# Patient Record
Sex: Female | Born: 1935 | Race: White | Hispanic: No | Smoking: Never smoker
Health system: Southern US, Community
[De-identification: ages and names within clinical notes are randomized; demographics above are authoritative.]

## PROBLEM LIST (undated history)

## (undated) DIAGNOSIS — I499 Cardiac arrhythmia, unspecified: Secondary | ICD-10-CM

## (undated) DIAGNOSIS — I4891 Unspecified atrial fibrillation: Secondary | ICD-10-CM

## (undated) DIAGNOSIS — R351 Nocturia: Secondary | ICD-10-CM

## (undated) DIAGNOSIS — D649 Anemia, unspecified: Secondary | ICD-10-CM

## (undated) DIAGNOSIS — K219 Gastro-esophageal reflux disease without esophagitis: Secondary | ICD-10-CM

## (undated) DIAGNOSIS — K649 Unspecified hemorrhoids: Secondary | ICD-10-CM

## (undated) DIAGNOSIS — E039 Hypothyroidism, unspecified: Secondary | ICD-10-CM

## (undated) DIAGNOSIS — Q6211 Congenital occlusion of ureteropelvic junction: Secondary | ICD-10-CM

## (undated) DIAGNOSIS — I4892 Unspecified atrial flutter: Secondary | ICD-10-CM

## (undated) DIAGNOSIS — R011 Cardiac murmur, unspecified: Secondary | ICD-10-CM

## (undated) DIAGNOSIS — Z8719 Personal history of other diseases of the digestive system: Secondary | ICD-10-CM

## (undated) DIAGNOSIS — R358 Other polyuria: Secondary | ICD-10-CM

## (undated) DIAGNOSIS — Z86018 Personal history of other benign neoplasm: Secondary | ICD-10-CM

## (undated) DIAGNOSIS — N390 Urinary tract infection, site not specified: Secondary | ICD-10-CM

## (undated) DIAGNOSIS — R3589 Other polyuria: Secondary | ICD-10-CM

## (undated) DIAGNOSIS — Z87440 Personal history of urinary (tract) infections: Secondary | ICD-10-CM

## (undated) DIAGNOSIS — N1330 Unspecified hydronephrosis: Secondary | ICD-10-CM

## (undated) DIAGNOSIS — E7800 Pure hypercholesterolemia, unspecified: Secondary | ICD-10-CM

## (undated) DIAGNOSIS — K449 Diaphragmatic hernia without obstruction or gangrene: Secondary | ICD-10-CM

## (undated) DIAGNOSIS — I10 Essential (primary) hypertension: Secondary | ICD-10-CM

## (undated) DIAGNOSIS — IMO0002 Reserved for concepts with insufficient information to code with codable children: Secondary | ICD-10-CM

## (undated) DIAGNOSIS — Z8639 Personal history of other endocrine, nutritional and metabolic disease: Secondary | ICD-10-CM

## (undated) HISTORY — DX: Reserved for concepts with insufficient information to code with codable children: IMO0002

## (undated) HISTORY — PX: THYROID LOBECTOMY: SHX420

## (undated) HISTORY — DX: Hypothyroidism, unspecified: E03.9

## (undated) HISTORY — DX: Gastro-esophageal reflux disease without esophagitis: K21.9

## (undated) HISTORY — PX: BACK SURGERY: SHX140

## (undated) HISTORY — DX: Diaphragmatic hernia without obstruction or gangrene: K44.9

## (undated) HISTORY — DX: Unspecified atrial fibrillation: I48.91

## (undated) HISTORY — DX: Urinary tract infection, site not specified: N39.0

## (undated) HISTORY — PX: CATARACT EXTRACTION: SUR2

## (undated) HISTORY — PX: CHOLECYSTECTOMY: SHX55

## (undated) HISTORY — PX: CERVICAL DISC SURGERY: SHX588

## (undated) HISTORY — DX: Unspecified atrial flutter: I48.92

## (undated) HISTORY — PX: TOTAL ABDOMINAL HYSTERECTOMY W/ BILATERAL SALPINGOOPHORECTOMY: SHX83

## (undated) HISTORY — DX: Unspecified hydronephrosis: N13.30

## (undated) HISTORY — DX: Congenital occlusion of ureteropelvic junction: Q62.11

---

## 2002-11-25 LAB — HM PAP SMEAR

## 2004-07-24 ENCOUNTER — Ambulatory Visit: Payer: Self-pay

## 2004-08-13 ENCOUNTER — Ambulatory Visit: Payer: Self-pay

## 2005-02-17 ENCOUNTER — Ambulatory Visit: Payer: Self-pay | Admitting: Internal Medicine

## 2005-03-04 ENCOUNTER — Ambulatory Visit: Payer: Self-pay | Admitting: Urology

## 2005-07-05 ENCOUNTER — Ambulatory Visit: Payer: Self-pay | Admitting: Urology

## 2005-09-03 ENCOUNTER — Ambulatory Visit (HOSPITAL_COMMUNITY): Admission: RE | Admit: 2005-09-03 | Discharge: 2005-09-03 | Payer: Self-pay | Admitting: Neurosurgery

## 2005-09-14 ENCOUNTER — Encounter: Admission: RE | Admit: 2005-09-14 | Discharge: 2005-09-14 | Payer: Self-pay | Admitting: Neurosurgery

## 2005-10-04 ENCOUNTER — Encounter: Admission: RE | Admit: 2005-10-04 | Discharge: 2005-10-04 | Payer: Self-pay | Admitting: Neurosurgery

## 2006-01-05 ENCOUNTER — Ambulatory Visit: Payer: Self-pay | Admitting: Urology

## 2006-01-07 ENCOUNTER — Encounter: Admission: RE | Admit: 2006-01-07 | Discharge: 2006-01-07 | Payer: Self-pay | Admitting: Neurosurgery

## 2006-04-19 DIAGNOSIS — K219 Gastro-esophageal reflux disease without esophagitis: Secondary | ICD-10-CM

## 2006-04-19 HISTORY — DX: Gastro-esophageal reflux disease without esophagitis: K21.9

## 2006-08-02 ENCOUNTER — Ambulatory Visit: Payer: Self-pay | Admitting: Infectious Diseases

## 2006-08-18 LAB — HM COLONOSCOPY

## 2006-09-06 ENCOUNTER — Ambulatory Visit: Payer: Self-pay | Admitting: Unknown Physician Specialty

## 2006-09-26 ENCOUNTER — Ambulatory Visit: Payer: Self-pay | Admitting: Specialist

## 2006-11-02 ENCOUNTER — Ambulatory Visit: Payer: Self-pay | Admitting: Internal Medicine

## 2006-11-02 LAB — HM DEXA SCAN

## 2006-11-21 ENCOUNTER — Encounter: Payer: Self-pay | Admitting: Internal Medicine

## 2006-12-19 ENCOUNTER — Encounter: Payer: Self-pay | Admitting: Internal Medicine

## 2006-12-23 ENCOUNTER — Ambulatory Visit: Payer: Self-pay | Admitting: Internal Medicine

## 2007-01-18 ENCOUNTER — Encounter: Payer: Self-pay | Admitting: Internal Medicine

## 2007-02-18 ENCOUNTER — Encounter: Payer: Self-pay | Admitting: Internal Medicine

## 2007-04-17 ENCOUNTER — Ambulatory Visit: Payer: Self-pay | Admitting: Internal Medicine

## 2007-05-18 ENCOUNTER — Ambulatory Visit: Payer: Self-pay | Admitting: Internal Medicine

## 2007-09-25 ENCOUNTER — Ambulatory Visit: Payer: Self-pay

## 2007-10-04 ENCOUNTER — Ambulatory Visit: Payer: Self-pay | Admitting: Urology

## 2008-02-15 ENCOUNTER — Ambulatory Visit: Payer: Self-pay | Admitting: Internal Medicine

## 2008-07-11 ENCOUNTER — Ambulatory Visit: Payer: Self-pay | Admitting: Internal Medicine

## 2008-07-22 ENCOUNTER — Encounter: Payer: Self-pay | Admitting: Internal Medicine

## 2008-08-17 ENCOUNTER — Encounter: Payer: Self-pay | Admitting: Internal Medicine

## 2008-09-17 ENCOUNTER — Encounter: Payer: Self-pay | Admitting: Internal Medicine

## 2008-10-09 ENCOUNTER — Ambulatory Visit: Payer: Self-pay | Admitting: Urology

## 2009-04-08 ENCOUNTER — Ambulatory Visit: Payer: Self-pay | Admitting: Internal Medicine

## 2009-04-13 ENCOUNTER — Inpatient Hospital Stay: Payer: Self-pay | Admitting: Internal Medicine

## 2009-06-20 ENCOUNTER — Ambulatory Visit: Payer: Self-pay | Admitting: Internal Medicine

## 2009-07-29 ENCOUNTER — Ambulatory Visit: Payer: Self-pay | Admitting: Internal Medicine

## 2009-12-09 ENCOUNTER — Emergency Department: Payer: Self-pay | Admitting: Internal Medicine

## 2009-12-10 ENCOUNTER — Emergency Department: Payer: Self-pay | Admitting: Emergency Medicine

## 2009-12-16 ENCOUNTER — Encounter: Payer: Self-pay | Admitting: Internal Medicine

## 2009-12-18 ENCOUNTER — Encounter: Payer: Self-pay | Admitting: Internal Medicine

## 2010-01-17 ENCOUNTER — Encounter: Payer: Self-pay | Admitting: Internal Medicine

## 2010-04-19 LAB — HM MAMMOGRAPHY

## 2010-05-07 ENCOUNTER — Ambulatory Visit: Payer: Self-pay | Admitting: Internal Medicine

## 2010-09-06 ENCOUNTER — Ambulatory Visit: Payer: Self-pay | Admitting: Internal Medicine

## 2010-12-04 ENCOUNTER — Encounter: Payer: Self-pay | Admitting: Internal Medicine

## 2010-12-15 ENCOUNTER — Ambulatory Visit (INDEPENDENT_AMBULATORY_CARE_PROVIDER_SITE_OTHER): Payer: Medicare Other | Admitting: Internal Medicine

## 2010-12-15 ENCOUNTER — Encounter: Payer: Self-pay | Admitting: Internal Medicine

## 2010-12-15 DIAGNOSIS — Z7901 Long term (current) use of anticoagulants: Secondary | ICD-10-CM

## 2010-12-15 DIAGNOSIS — E039 Hypothyroidism, unspecified: Secondary | ICD-10-CM | POA: Insufficient documentation

## 2010-12-15 DIAGNOSIS — IMO0001 Reserved for inherently not codable concepts without codable children: Secondary | ICD-10-CM

## 2010-12-15 DIAGNOSIS — K589 Irritable bowel syndrome without diarrhea: Secondary | ICD-10-CM

## 2010-12-15 DIAGNOSIS — N390 Urinary tract infection, site not specified: Secondary | ICD-10-CM | POA: Insufficient documentation

## 2010-12-15 DIAGNOSIS — I4891 Unspecified atrial fibrillation: Secondary | ICD-10-CM | POA: Insufficient documentation

## 2010-12-15 DIAGNOSIS — K219 Gastro-esophageal reflux disease without esophagitis: Secondary | ICD-10-CM

## 2010-12-15 LAB — POCT URINALYSIS DIPSTICK
Bilirubin, UA: NEGATIVE
Glucose, UA: NEGATIVE
Ketones, UA: NEGATIVE
Leukocytes, UA: NEGATIVE
Nitrite, UA: NEGATIVE
Protein, UA: NEGATIVE
Spec Grav, UA: 1.02
Urobilinogen, UA: 0.2
pH, UA: 5.5

## 2010-12-15 LAB — TSH: TSH: 2.34 u[IU]/mL (ref 0.35–5.50)

## 2010-12-15 LAB — PROTIME-INR
INR: 3.8 ratio — ABNORMAL HIGH (ref 0.8–1.0)
Prothrombin Time: 37.4 s — ABNORMAL HIGH (ref 10.2–12.4)

## 2010-12-15 NOTE — Patient Instructions (Addendum)
Try taking 10 or 20 mg of otc pepcid ( famotidine) at bedtime to reduce your morning reflux symptoms.   If you have episodes of rapid heart rate ,  (rate > 100 beats/minute),  We will prescribe a short acting medicine that will slow it down.) We will repeat your urine today to see if the infection has cleared.  You can stop the nitrofurantoin.

## 2010-12-15 NOTE — Assessment & Plan Note (Signed)
PT/INR is due for monthly check.

## 2010-12-15 NOTE — Assessment & Plan Note (Signed)
symptoms not controlled with once daily PPI.  Adding nighttime blocker.

## 2010-12-15 NOTE — Progress Notes (Signed)
  Subjective:    [name], female    DOB: 12/27/1929, 77 y.o.   MRN: [id number]  HPI  [name] is a 77 yr old white female with a history of chronic abdominal pain secondary to IBS, chronic atrial fibrillation on rate controlling medications and long term anticoagulation who presents with increased abdominal pain following treatment for a UTI by Urgent care on Sunday with nitrofurantoin.  She has taken 4 doses of the medication and has increased nausea and reflux symptoms .  Dysuria has resolved,    Review of Systems  Constitutional: Negative for fever, chills and fatigue.  Gastrointestinal: Positive for nausea. Negative for vomiting, abdominal pain, diarrhea, constipation and rectal pain.  Genitourinary: Negative for dysuria, urgency, frequency, hematuria, flank pain, decreased urine volume, vaginal bleeding, vaginal discharge, difficulty urinating, vaginal pain and pelvic pain.  Musculoskeletal: Positive for back pain.       Objective:   Physical Exam  Constitutional: She is oriented to person, place, and time. She appears well-developed and well-nourished.  HENT:  Mouth/Throat: Oropharynx is clear and moist.  Eyes: EOM are normal. Pupils are equal, round, and reactive to light. No scleral icterus.  Neck: Normal range of motion. Neck supple. No JVD present. No thyromegaly present.  Cardiovascular: Normal rate, normal heart sounds and intact distal pulses.   Pulmonary/Chest: Effort normal and breath sounds normal.  Abdominal: Soft. Bowel sounds are normal. She exhibits no mass. There is no tenderness.  Musculoskeletal: Normal range of motion. She exhibits no edema.  Lymphadenopathy:    She has no cervical adenopathy.  Neurological: She is alert and oriented to person, place, and time.  Skin: Skin is warm and dry.  Psychiatric: She has a normal mood and affect.          Assessment & Plan:

## 2010-12-15 NOTE — Assessment & Plan Note (Addendum)
Repeat UA today was normal.  Since she is not tolerating macrobid,  Will stop abx. Culture data from Urgent Care requested.

## 2010-12-15 NOTE — Assessment & Plan Note (Signed)
Currently rate controlled.  She is instructed to measure her pulse when she has symptoms of palpitations.

## 2010-12-16 ENCOUNTER — Other Ambulatory Visit: Payer: Self-pay | Admitting: Internal Medicine

## 2010-12-16 DIAGNOSIS — Z7901 Long term (current) use of anticoagulants: Secondary | ICD-10-CM

## 2010-12-18 ENCOUNTER — Telehealth: Payer: Self-pay | Admitting: Internal Medicine

## 2010-12-18 ENCOUNTER — Other Ambulatory Visit: Payer: Self-pay | Admitting: Internal Medicine

## 2010-12-18 MED ORDER — WARFARIN SODIUM 5 MG PO TABS: 5.0000 mg | ORAL_TABLET | Freq: Every day | ORAL | Status: DC

## 2010-12-18 NOTE — Telephone Encounter (Signed)
Patient is asking if she should start taking the 6 mg of coumadin tomorrow 12/19/10, she said the doctor recommend she start taking 5mg  when she started taking the coumadin again. If an Rx needs to be called in please send it to [facility] in [place]. Patient said okay to leave detailed mess on her home ans mach

## 2010-12-18 NOTE — Telephone Encounter (Signed)
Patient has been advised on how to take her medication.  5 mg everyday starting today (12/18/10)  Rx has been called in.

## 2010-12-19 ENCOUNTER — Encounter: Payer: Self-pay | Admitting: Internal Medicine

## 2010-12-19 DIAGNOSIS — K589 Irritable bowel syndrome without diarrhea: Secondary | ICD-10-CM

## 2010-12-23 ENCOUNTER — Other Ambulatory Visit (INDEPENDENT_AMBULATORY_CARE_PROVIDER_SITE_OTHER): Payer: Medicare Other | Admitting: *Deleted

## 2010-12-23 DIAGNOSIS — Z7901 Long term (current) use of anticoagulants: Secondary | ICD-10-CM

## 2010-12-23 LAB — PROTIME-INR
INR: 2 ratio — ABNORMAL HIGH (ref 0.8–1.0)
Prothrombin Time: 20.7 s — ABNORMAL HIGH (ref 10.2–12.4)

## 2011-01-20 ENCOUNTER — Encounter: Payer: Self-pay | Admitting: Internal Medicine

## 2011-01-20 ENCOUNTER — Ambulatory Visit (INDEPENDENT_AMBULATORY_CARE_PROVIDER_SITE_OTHER): Payer: Medicare Other | Admitting: Internal Medicine

## 2011-01-20 DIAGNOSIS — K209 Esophagitis, unspecified without bleeding: Secondary | ICD-10-CM

## 2011-01-20 DIAGNOSIS — Z7901 Long term (current) use of anticoagulants: Secondary | ICD-10-CM

## 2011-01-20 DIAGNOSIS — J329 Chronic sinusitis, unspecified: Secondary | ICD-10-CM

## 2011-01-20 MED ORDER — AZITHROMYCIN 500 MG PO TABS: ORAL_TABLET | ORAL | Status: DC

## 2011-01-20 NOTE — Patient Instructions (Addendum)
Take a dose of mylanta or gaviscon liquid,  At lunch and at 7 pm.  To see if it helps the burning.  Continue the generic protonix twice daily   Start the azithromycin tonight with your coumadin.  Skip your coumadin dose on Friday

## 2011-01-21 ENCOUNTER — Encounter: Payer: Self-pay | Admitting: Internal Medicine

## 2011-01-21 LAB — PROTIME-INR
INR: 1.98 — ABNORMAL HIGH (ref <1.50)
Prothrombin Time: 23.2 seconds — ABNORMAL HIGH (ref 11.6–15.2)

## 2011-01-21 NOTE — Progress Notes (Signed)
  Subjective:    [name], female    DOB: February 13, 1930, 77 y.o.   MRN: [id number]  HPI 77 yo white feamle with frequent visits for recurrent abdominal and esophageal discomfort attributed to IBSand GERD  by prior comprehensive GI workup,presents with increased esophageal burning and sore throat which has been worse for the past 4 to 5 days and accompanied by sinus drainage. No fevers but feels generally terrible.  Using protonix generic twice daily but has not tried mylanta or gaviscon for immediate relief of burning due to confusion about timing of administration around her other scheduled medications.   Past Medical History  Diagnosis Date  . Hypothyroidism   . Osteoporosis   . Recurrent UTI   . Hydronephrosis     uretero- with congenital UPJ obstruction- [name]  . GERD (gastroesophageal reflux disease)   . DDD (degenerative disc disease)   . Atrial fib/flutter, transient     [facility] admission for RVR 03/2009  . Hydronephrosis with ureteropelvic junction obstruction    Current Outpatient Prescriptions on File Prior to Visit  Medication Sig Dispense Refill  . diltiazem (DILACOR XR) 180 MG 24 hr capsule Take 180 mg by mouth daily.        Marland Kitchen levothyroxine (SYNTHROID, LEVOTHROID) 50 MCG tablet Take 50 mcg by mouth daily.        . pantoprazole (PROTONIX) 40 MG tablet Take 40 mg by mouth 2 (two) times daily.        . Simethicone (GAS-X PO) Take by mouth as needed       . Tetrahydrozoline HCl (EYE DROPS OP) Always eye drops- one drop two times daily in both eyes      . warfarin (COUMADIN) 5 MG tablet Take 1 tablet (5 mg total) by mouth daily.  30 tablet  3    Review of Systems  Constitutional: Negative for fever, chills, fatigue and unexpected weight change.  HENT: Positive for sore throat, trouble swallowing and sinus pressure. Negative for hearing loss, ear pain, nosebleeds, congestion, facial swelling, rhinorrhea, sneezing, mouth sores, neck pain, neck stiffness, voice  change, postnasal drip, tinnitus and ear discharge.   Eyes: Negative for pain, discharge, redness and visual disturbance.  Respiratory: Positive for cough. Negative for chest tightness, shortness of breath, wheezing and stridor.   Cardiovascular: Negative for chest pain, palpitations and leg swelling.  Gastrointestinal: Positive for nausea and abdominal pain. Negative for vomiting, diarrhea, constipation and rectal pain.  Genitourinary: Negative for dysuria, urgency, frequency, hematuria, flank pain, decreased urine volume, vaginal bleeding, vaginal discharge, difficulty urinating, vaginal pain and pelvic pain.  Musculoskeletal: Positive for back pain. Negative for myalgias and arthralgias.  Skin: Negative for color change and rash.  Neurological: Negative for dizziness, weakness, light-headedness and headaches.  Hematological: Negative for adenopathy.       Objective:   Physical Exam        Assessment & Plan:  Pharyngitis/esophagitis:  symptosm may be current exacerbated by URI with excessive sinus drainage.  Will treat empirically with one week of azithromycin and have her try gaviscon as directed for immediate relief of burning.  i do wonder if her persistent symptoms may be due to uncontrolled esophagitis due to hiatal hernia and have dicussed referral to [facility] for a fresh perspective since I have been unable to get her symptoms under control.

## 2011-01-25 ENCOUNTER — Other Ambulatory Visit: Payer: Medicare Other

## 2011-02-15 ENCOUNTER — Encounter: Payer: Self-pay | Admitting: Internal Medicine

## 2011-02-15 ENCOUNTER — Ambulatory Visit (INDEPENDENT_AMBULATORY_CARE_PROVIDER_SITE_OTHER): Payer: Medicare Other | Admitting: Internal Medicine

## 2011-02-15 DIAGNOSIS — K209 Esophagitis, unspecified without bleeding: Secondary | ICD-10-CM

## 2011-02-15 DIAGNOSIS — M79609 Pain in unspecified limb: Secondary | ICD-10-CM

## 2011-02-15 DIAGNOSIS — Z7901 Long term (current) use of anticoagulants: Secondary | ICD-10-CM

## 2011-02-15 MED ORDER — SUCRALFATE 1 GM/10ML PO SUSP: 1.0000 g | Freq: Every evening | ORAL | Status: DC | PRN

## 2011-02-15 NOTE — Progress Notes (Signed)
Subjective:    [name], female    DOB: 06/01/1929, 77 y.o.   MRN: [id number]  HPI  77 yo white female with history of chronic abdominal complaints secondary to IBS and gastritis, atrial fibrillation on chronic anticoagulation presents with persistent esophageal burning which is waking her up at night. Her new complaint is right upper arm pain in the bicps afrea aggravated by adduction and internal rotation of arm.  Recent activities include use of al eaf blower over the weekend. Past Medical History  Diagnosis Date  . Hypothyroidism   . Osteoporosis   . Recurrent UTI   . Hydronephrosis     uretero- with congenital UPJ obstruction- [name]  . GERD (gastroesophageal reflux disease)   . DDD (degenerative disc disease)   . Atrial fib/flutter, transient     [facility] admission for RVR 03/2009  . Hydronephrosis with ureteropelvic junction obstruction      Current Outpatient Prescriptions on File Prior to Visit  Medication Sig Dispense Refill  . diltiazem (DILACOR XR) 180 MG 24 hr capsule Take 180 mg by mouth daily.        Marland Kitchen levothyroxine (SYNTHROID, LEVOTHROID) 50 MCG tablet Take 50 mcg by mouth daily.        . pantoprazole (PROTONIX) 40 MG tablet Take 40 mg by mouth 2 (two) times daily.        . Simethicone (GAS-X PO) Take by mouth as needed       . Tetrahydrozoline HCl (EYE DROPS OP) Always eye drops- one drop two times daily in both eyes      . warfarin (COUMADIN) 5 MG tablet Take 1 tablet (5 mg total) by mouth daily.  30 tablet  3   Review of Systems  Constitutional: Negative for fever, chills, fatigue and unexpected weight change.  HENT: Positive for sore throat, trouble swallowing and sinus pressure. Negative for hearing loss, ear pain, nosebleeds, congestion, facial swelling, rhinorrhea, sneezing, mouth sores, neck pain, neck stiffness, voice change, postnasal drip, tinnitus and ear discharge.   Eyes: Negative for pain, discharge, redness and visual disturbance.    Respiratory: Positive for cough. Negative for chest tightness, shortness of breath, wheezing and stridor.   Cardiovascular: Negative for chest pain, palpitations and leg swelling.  Gastrointestinal: Positive for nausea and abdominal pain. Negative for vomiting, diarrhea, constipation and rectal pain.  Genitourinary: Negative for dysuria, urgency, frequency, hematuria, flank pain, decreased urine volume, vaginal bleeding, vaginal discharge, difficulty urinating, vaginal pain and pelvic pain.  Musculoskeletal: Positive for back pain. Negative for myalgias and arthralgias.  Skin: Negative for color change and rash.  Neurological: Negative for dizziness, weakness, light-headedness and headaches.  Hematological: Negative for adenopathy.   BP 141/83  Pulse 77  Temp(Src) 97.8 F (36.6 C) (Oral)  Resp 16  Ht 5\' 4"  (1.626 m)  Wt 147 lb 8 oz (66.906 kg)  BMI 25.32 kg/m2  SpO2 97%     Objective:   Physical Exam  Constitutional: She is oriented to person, place, and time. She appears well-developed and well-nourished.  HENT:  Mouth/Throat: Oropharynx is clear and moist.  Eyes: EOM are normal. Pupils are equal, round, and reactive to light. No scleral icterus.  Neck: Normal range of motion. Neck supple. No JVD present. No thyromegaly present.  Cardiovascular: Normal rate, regular rhythm, normal heart sounds and intact distal pulses.   Pulmonary/Chest: Effort normal and breath sounds normal.  Abdominal: Soft. Bowel sounds are normal. She exhibits no mass. There is no tenderness.  Musculoskeletal: Normal range of motion. She exhibits tenderness. She exhibits no edema.  Lymphadenopathy:    She has no cervical adenopathy.  Neurological: She is alert and oriented to person, place, and time.  Skin: Skin is warm and dry.  Psychiatric: She has a normal mood and affect.      Assessment & Plan:  Arm pain:  She appears to have strained her biceps from using the leaf blower.  Recommended use od NSAID  judiciously, and ice/heat.   Esophagitis:  She has persistent symptoms despite use of protonix bid.  Trial of sucralfate.

## 2011-02-15 NOTE — Patient Instructions (Signed)
We are going to try a dose of sucralfate (2 teaspoons) at bedtime for your esophagus and stomach burning.  You may repeat it at 3 am if you need to.    You strained your bicep muscle from blowing your leaves.  You can try Alleve or Motrin for pain and try icing it for 15 minutes every 5 or 6 hours

## 2011-02-22 ENCOUNTER — Other Ambulatory Visit (INDEPENDENT_AMBULATORY_CARE_PROVIDER_SITE_OTHER): Payer: Medicare Other | Admitting: *Deleted

## 2011-02-22 ENCOUNTER — Telehealth: Payer: Self-pay | Admitting: Internal Medicine

## 2011-02-22 DIAGNOSIS — Z7901 Long term (current) use of anticoagulants: Secondary | ICD-10-CM

## 2011-02-22 DIAGNOSIS — Z23 Encounter for immunization: Secondary | ICD-10-CM

## 2011-02-22 LAB — PROTIME-INR
INR: 1.9 ratio — ABNORMAL HIGH (ref 0.8–1.0)
Prothrombin Time: 21.4 s — ABNORMAL HIGH (ref 10.2–12.4)

## 2011-02-22 NOTE — Telephone Encounter (Signed)
[phone] Pt came in for labs and she said when she was in last Monday Dr Derrel Nip had sent in an rx to [facility] in [place].   [facility] has not filled this yet its a brand name rx

## 2011-02-22 NOTE — Progress Notes (Signed)
Addended by: Lacretia Nicks L on: 02/22/2011 11:12 AM   Modules accepted: Orders

## 2011-02-23 NOTE — Telephone Encounter (Signed)
I called patient and found out what Rx she was talking about and it has been called in.

## 2011-02-24 ENCOUNTER — Other Ambulatory Visit: Payer: Medicare Other

## 2011-03-08 ENCOUNTER — Encounter: Payer: Self-pay | Admitting: Internal Medicine

## 2011-03-08 ENCOUNTER — Ambulatory Visit (INDEPENDENT_AMBULATORY_CARE_PROVIDER_SITE_OTHER): Payer: Medicare Other | Admitting: Internal Medicine

## 2011-03-08 VITALS — BP 122/62 | HR 93 | Temp 98.5°F | Resp 14 | Wt 146.2 lb

## 2011-03-08 DIAGNOSIS — K219 Gastro-esophageal reflux disease without esophagitis: Secondary | ICD-10-CM

## 2011-03-08 DIAGNOSIS — R1013 Epigastric pain: Secondary | ICD-10-CM

## 2011-03-08 LAB — COMPREHENSIVE METABOLIC PANEL
ALT: 10 U/L (ref 0–35)
AST: 23 U/L (ref 0–37)
Albumin: 4 g/dL (ref 3.5–5.2)
Alkaline Phosphatase: 74 U/L (ref 39–117)
BUN: 17 mg/dL (ref 6–23)
CO2: 28 mEq/L (ref 19–32)
Calcium: 9.2 mg/dL (ref 8.4–10.5)
Chloride: 107 mEq/L (ref 96–112)
Creatinine, Ser: 1.1 mg/dL (ref 0.4–1.2)
GFR: 52.59 mL/min — ABNORMAL LOW (ref >60.00)
Glucose, Bld: 96 mg/dL (ref 70–99)
Potassium: 5.4 mEq/L — ABNORMAL HIGH (ref 3.5–5.1)
Sodium: 141 mEq/L (ref 135–145)
Total Bilirubin: 0.5 mg/dL (ref 0.3–1.2)
Total Protein: 7.1 g/dL (ref 6.0–8.3)

## 2011-03-08 LAB — CBC WITH DIFFERENTIAL/PLATELET
Basophils Absolute: 0 10*3/uL (ref 0.0–0.1)
Basophils Relative: 0.3 % (ref 0.0–3.0)
Eosinophils Absolute: 0.1 10*3/uL (ref 0.0–0.7)
Eosinophils Relative: 1.1 % (ref 0.0–5.0)
HCT: 34.5 % — ABNORMAL LOW (ref 36.0–46.0)
Hemoglobin: 11.8 g/dL — ABNORMAL LOW (ref 12.0–15.0)
Lymphocytes Relative: 27.3 % (ref 12.0–46.0)
Lymphs Abs: 1.5 10*3/uL (ref 0.7–4.0)
MCHC: 34.2 g/dL (ref 30.0–36.0)
MCV: 91.4 fl (ref 78.0–100.0)
Monocytes Absolute: 0.4 10*3/uL (ref 0.1–1.0)
Monocytes Relative: 7.7 % (ref 3.0–12.0)
Neutro Abs: 3.6 10*3/uL (ref 1.4–7.7)
Neutrophils Relative %: 63.6 % (ref 43.0–77.0)
Platelets: 159 10*3/uL (ref 150.0–400.0)
RBC: 3.78 Mil/uL — ABNORMAL LOW (ref 3.87–5.11)
RDW: 13.3 % (ref 11.5–14.6)
WBC: 5.7 10*3/uL (ref 4.5–10.5)

## 2011-03-08 MED ORDER — SUCRALFATE 1 G PO TABS: 1.0000 g | ORAL_TABLET | Freq: Four times a day (QID) | ORAL | Status: DC

## 2011-03-08 NOTE — Progress Notes (Signed)
Subjective:    [name], female    DOB: 16-Oct-1929, 77 y.o.   MRN: [id number]  HPI 77YO female with GERD presents for acute visit complaining of several month h/o gradually worsening epigastric pain.  Reports burning pain upper abdomen. No improvement with protonix. Has not yet started Sucrafate prescribed by Dr. Derrel Nip because of cost of this medication. Has occasional nausea and diarrhea. No vomiting. No black stool or blood in stool. Reports appetite recently limited and she is losing weight. No fever or chills. Progressively worsening fatigue.  Outpatient Encounter Prescriptions as of 03/08/2011  Medication Sig Dispense Refill  . diltiazem (DILACOR XR) 180 MG 24 hr capsule Take 180 mg by mouth daily.        Marland Kitchen levothyroxine (SYNTHROID, LEVOTHROID) 50 MCG tablet Take 50 mcg by mouth daily.        . pantoprazole (PROTONIX) 40 MG tablet Take 40 mg by mouth 2 (two) times daily.        . Simethicone (GAS-X PO) Take by mouth as needed       . Tetrahydrozoline HCl (EYE DROPS OP) Always eye drops- one drop two times daily in both eyes      . warfarin (COUMADIN) 5 MG tablet Take 1 tablet (5 mg total) by mouth daily.  30 tablet  3  . sucralfate (CARAFATE) 1 G tablet Take 1 tablet (1 g total) by mouth 4 (four) times daily.  120 tablet  3    Review of Systems  Constitutional: Positive for fatigue. Negative for fever, chills, diaphoresis and unexpected weight change.  Respiratory: Negative for cough and shortness of breath.   Cardiovascular: Negative for chest pain.  Gastrointestinal: Positive for nausea, abdominal pain, diarrhea and abdominal distention. Negative for vomiting, constipation, blood in stool and anal bleeding.   BP 122/62  Pulse 93  Temp(Src) 98.5 F (36.9 C) (Oral)  Resp 14  Wt 146 lb 4 oz (66.339 kg)  SpO2 98%     Objective:   Physical Exam  Constitutional: She is oriented to person, place, and time. She appears well-developed and well-nourished. No  distress.  HENT:  Head: Normocephalic and atraumatic.  Right Ear: External ear normal.  Left Ear: External ear normal.  Nose: Nose normal.  Mouth/Throat: Oropharynx is clear and moist. No oropharyngeal exudate.  Eyes: Conjunctivae are normal. Pupils are equal, round, and reactive to light. Right eye exhibits no discharge. Left eye exhibits no discharge. No scleral icterus.  Neck: Normal range of motion. Neck supple. No tracheal deviation present. No thyromegaly present.  Cardiovascular: Normal rate, regular rhythm, normal heart sounds and intact distal pulses.  Exam reveals no gallop and no friction rub.   No murmur heard. Pulmonary/Chest: Effort normal and breath sounds normal. No respiratory distress. She has no wheezes. She has no rales. She exhibits no tenderness.  Abdominal: She exhibits no distension. There is tenderness (epigastric). There is guarding (epigastric).  Musculoskeletal: Normal range of motion. She exhibits no edema and no tenderness.  Lymphadenopathy:    She has no cervical adenopathy.  Neurological: She is alert and oriented to person, place, and time. No cranial nerve deficit. She exhibits normal muscle tone. Coordination normal.  Skin: Skin is warm and dry. No rash noted. She is not diaphoretic. No erythema. No pallor.  Psychiatric: She has a normal mood and affect. Her behavior is normal. Judgment and thought content normal.          Assessment & Plan:  1. Epigastric abdominal  pain - Suspect gastritis vs. Ulcer. No response to Protonix. Has not started sucralfate. Will change to Dexilant and start Sucralfate. Will set up GI evaluation for endoscopy asap. Will check for H. Pylori infection. Will also check CMP and CBC with labs.  Follow up 2 weeks.

## 2011-03-08 NOTE — Patient Instructions (Signed)
Try Dexilant in place of Protonix for the next week. Labs today. Follow up in 2 weeks.

## 2011-03-09 LAB — HELICOBACTER PYLORI  ANTIBODY, IGM: Helicobacter pylori, IgM: 2.5 U/mL (ref <9.0)

## 2011-03-22 ENCOUNTER — Ambulatory Visit (INDEPENDENT_AMBULATORY_CARE_PROVIDER_SITE_OTHER): Payer: Medicare Other | Admitting: Internal Medicine

## 2011-03-22 ENCOUNTER — Encounter: Payer: Self-pay | Admitting: Internal Medicine

## 2011-03-22 DIAGNOSIS — Z7901 Long term (current) use of anticoagulants: Secondary | ICD-10-CM

## 2011-03-22 DIAGNOSIS — K2100 Gastro-esophageal reflux disease with esophagitis, without bleeding: Secondary | ICD-10-CM

## 2011-03-22 DIAGNOSIS — K209 Esophagitis, unspecified without bleeding: Secondary | ICD-10-CM

## 2011-03-22 LAB — PROTIME-INR
INR: 1.1 ratio — ABNORMAL HIGH (ref 0.8–1.0)
Prothrombin Time: 11.7 s (ref 10.2–12.4)

## 2011-03-22 MED ORDER — DEXLANSOPRAZOLE 60 MG PO CPDR: 60.0000 mg | DELAYED_RELEASE_CAPSULE | Freq: Every day | ORAL | Status: DC

## 2011-03-22 NOTE — Patient Instructions (Addendum)
Try zyrtec ,  Allegra or claritin to see if the postnasal drip is from allergies.   Cetirizine  ,  Fexofenadine,  Or loratidine are the generic medications.     You need a TdaP vaccine (tetanus diphtheria pertussis) vaccine which is free at the health department

## 2011-03-23 MED ORDER — WARFARIN SODIUM 1 MG PO TABS: 1.0000 mg | ORAL_TABLET | Freq: Every day | ORAL | Status: DC

## 2011-03-24 ENCOUNTER — Encounter: Payer: Self-pay | Admitting: Internal Medicine

## 2011-03-24 ENCOUNTER — Ambulatory Visit: Payer: Medicare Other

## 2011-03-24 NOTE — Assessment & Plan Note (Addendum)
Her PT/INR was subtherapeutic today on 5 mg daily.  Dose was increased to 6 mg daily and will be repeated in 2 weeks.

## 2011-03-24 NOTE — Progress Notes (Signed)
Subjective:    [name], female    DOB: 18-Jun-1929, 77 y.o.   MRN: [id number]  HPI  Alexandra Martinez returns for followup on recent medication change for persistent esophagitis.  She was seen two weeks ago by Dr, Gilford Rile with persistent esophgeal and abdominal pain despite use of daily PPI and antacids.  She has had endoscopy within the last 3 years with no signs of Barretts esophagus or gastric ulcer and has been diagnosed with IBS.  Since her PPI was changed to Dexilant (samples given to her last visit), she reports marked imporvement in symtpoms of esophageal pain and gastric discomfort.  Past Medical History  Diagnosis Date  . Hypothyroidism   . Osteoporosis   . Recurrent UTI   . Hydronephrosis     uretero- with congenital UPJ obstruction- Alexandra Martinez  . GERD (gastroesophageal reflux disease)   . DDD (degenerative disc disease)   . Atrial fib/flutter, transient     [facility] admission for RVR 03/2009  . Hydronephrosis with ureteropelvic junction obstruction     Current Outpatient Prescriptions on File Prior to Visit  Medication Sig Dispense Refill  . diltiazem (DILACOR XR) 180 MG 24 hr capsule Take 180 mg by mouth daily.        Marland Kitchen levothyroxine (SYNTHROID, LEVOTHROID) 50 MCG tablet Take 50 mcg by mouth daily.        . pantoprazole (PROTONIX) 40 MG tablet Take 40 mg by mouth 2 (two) times daily.        . Simethicone (GAS-X PO) Take by mouth as needed       . sucralfate (CARAFATE) 1 G tablet Take 1 tablet (1 g total) by mouth 4 (four) times daily.  120 tablet  3  . Tetrahydrozoline HCl (EYE DROPS OP) Always eye drops- one drop two times daily in both eyes      . warfarin (COUMADIN) 5 MG tablet Take 1 tablet (5 mg total) by mouth daily.  30 tablet  3      Review of Systems  Constitutional: Negative for fever, chills and unexpected weight change.  HENT: Negative for hearing loss, ear pain, nosebleeds, congestion, sore throat, facial swelling, rhinorrhea, sneezing, mouth sores,  trouble swallowing, neck pain, neck stiffness, voice change, postnasal drip, sinus pressure, tinnitus and ear discharge.   Eyes: Negative for pain, discharge, redness and visual disturbance.  Respiratory: Negative for cough, chest tightness, shortness of breath, wheezing and stridor.   Cardiovascular: Negative for chest pain, palpitations and leg swelling.  Musculoskeletal: Negative for myalgias and arthralgias.  Skin: Negative for color change and rash.  Neurological: Negative for dizziness, weakness, light-headedness and headaches.  Hematological: Negative for adenopathy.      BP 138/60  Pulse 91  Temp(Src) 97.8 F (36.6 C) (Oral)  Resp 16  Ht 5\' 4"  (1.626 m)  Wt 148 lb (67.132 kg)  BMI 25.40 kg/m2  SpO2 96%      Objective:   Physical Exam  Constitutional: She is oriented to person, place, and time. She appears well-developed and well-nourished.  HENT:  Mouth/Throat: Oropharynx is clear and moist.  Eyes: EOM are normal. Pupils are equal, round, and reactive to light. No scleral icterus.  Neck: Normal range of motion. Neck supple. No JVD present. No thyromegaly present.  Cardiovascular: Normal rate, regular rhythm, normal heart sounds and intact distal pulses.   Pulmonary/Chest: Effort normal and breath sounds normal.  Abdominal: Soft. Bowel sounds are normal. She exhibits no mass. There is no tenderness.  Musculoskeletal: Normal range of motion. She exhibits no edema.  Lymphadenopathy:    She has no cervical adenopathy.  Neurological: She is alert and oriented to person, place, and time.  Skin: Skin is warm and dry.  Psychiatric: She has a normal mood and affect.          Assessment & Plan:

## 2011-03-24 NOTE — Assessment & Plan Note (Signed)
After failing treatment with Protonix, omeprazole and Nexium, she has had relief of symptos with Dexilant.

## 2011-04-05 ENCOUNTER — Telehealth: Payer: Self-pay | Admitting: Internal Medicine

## 2011-04-05 NOTE — Telephone Encounter (Signed)
Pt called stating that she has an endo on Thursday and the gi doc told her to stop taking her blood thinner Monday Tuesday Wednesday.  She wanted to know when to come back in to get her protime checked. Spoke with [employer] c .  Caryl Pina wanted her to come in on Monday 04/12/11  Pt aware of appointment

## 2011-04-09 ENCOUNTER — Other Ambulatory Visit (INDEPENDENT_AMBULATORY_CARE_PROVIDER_SITE_OTHER): Payer: Medicare Other | Admitting: *Deleted

## 2011-04-09 DIAGNOSIS — Z7901 Long term (current) use of anticoagulants: Secondary | ICD-10-CM

## 2011-04-09 LAB — PROTIME-INR
INR: 1.2 ratio — ABNORMAL HIGH (ref 0.8–1.0)
Prothrombin Time: 13.6 s — ABNORMAL HIGH (ref 10.2–12.4)

## 2011-04-12 ENCOUNTER — Other Ambulatory Visit: Payer: Medicare Other

## 2011-04-12 NOTE — Telephone Encounter (Signed)
See lab result note.

## 2011-04-16 ENCOUNTER — Encounter: Payer: Self-pay | Admitting: Internal Medicine

## 2011-04-16 DIAGNOSIS — R110 Nausea: Secondary | ICD-10-CM | POA: Insufficient documentation

## 2011-04-19 ENCOUNTER — Other Ambulatory Visit (INDEPENDENT_AMBULATORY_CARE_PROVIDER_SITE_OTHER): Payer: Medicare Other | Admitting: *Deleted

## 2011-04-19 ENCOUNTER — Ambulatory Visit (INDEPENDENT_AMBULATORY_CARE_PROVIDER_SITE_OTHER): Payer: Medicare Other | Admitting: Internal Medicine

## 2011-04-19 ENCOUNTER — Encounter: Payer: Self-pay | Admitting: Internal Medicine

## 2011-04-19 VITALS — BP 122/62 | HR 76 | Temp 97.6°F | Wt 144.0 lb

## 2011-04-19 DIAGNOSIS — Z7901 Long term (current) use of anticoagulants: Secondary | ICD-10-CM

## 2011-04-19 DIAGNOSIS — J329 Chronic sinusitis, unspecified: Secondary | ICD-10-CM

## 2011-04-19 DIAGNOSIS — J069 Acute upper respiratory infection, unspecified: Secondary | ICD-10-CM

## 2011-04-19 LAB — PROTIME-INR
INR: 2.5 — ABNORMAL HIGH (ref <1.50)
Prothrombin Time: 27.8 seconds — ABNORMAL HIGH (ref 11.6–15.2)

## 2011-04-19 MED ORDER — BENZONATATE 200 MG PO CAPS: 200.0000 mg | ORAL_CAPSULE | Freq: Two times a day (BID) | ORAL | Status: DC | PRN

## 2011-04-19 MED ORDER — AMOXICILLIN-POT CLAVULANATE 875-125 MG PO TABS: 1.0000 | ORAL_TABLET | Freq: Two times a day (BID) | ORAL | Status: AC

## 2011-04-19 MED ORDER — BENZONATATE 200 MG PO CAPS: 200.0000 mg | ORAL_CAPSULE | Freq: Two times a day (BID) | ORAL | Status: AC | PRN

## 2011-04-19 NOTE — Patient Instructions (Addendum)
Take Sudafed PE 10 mg every 6 hours as needed for congestion  Take mucinex (guiafenesin)  600 mg every 12 hours for thick tenacious sputum  Gargle with salt water if throat is sore,  And saline nasal rinse twice daily    Use Delsym(dextromethorphan) for daytime cough ,  And use the Tessalon prescription for evening cough or severe daytime cough.    Take the augmentin twice daily with meals to prevent nausea

## 2011-04-19 NOTE — Progress Notes (Signed)
  Subjective:    [name], female    DOB: May 04, 1929, 77 y.o.   MRN: [id number]  HPI  Presents with productive cough,  Sinus congesttion,  X 2 weeks.  Not taking any OTC.  syumptoms aggravated by endoscopy    Review of Systems     Objective:   Physical Exam        Assessment & Plan:   Subjective:     Alexandra Martinez is a 77 y.o. female who presents for evaluation of symptoms of a URI. Symptoms include bilateral ear pressure/pain, congestion, coryza, cough described as worsening over time, low grade fever, post nasal drip and productive cough with  yellow colored sputum. Onset of symptoms was 2 weeks ago, and has been gradually worsening since that time. Treatment to date: cough suppressants.  The following portions of the patient's history were reviewed and updated as appropriate: allergies, current medications, past family history, past medical history, past social history, past surgical history and problem list.  Review of Systems A comprehensive review of systems was negative except for: Ears, nose, mouth, throat, and face: positive for nasal congestion and sore throat   Objective:    BP 122/62  Pulse 76  Temp(Src) 97.6 F (36.4 C) (Oral)  Wt 144 lb (65.318 kg) General appearance: alert, cooperative and appears stated age Ears: normal TM's and external ear canals both ears Nose: Nares normal. Septum midline. Mucosa normal. No drainage or sinus tenderness., purulent discharge, mild congestion, sinus tenderness bilateral Throat: abnormal findings: mild oropharyngeal erythema Lungs: clear to auscultation bilaterally   Assessment:    sinusitis   Plan:    Discussed diagnosis and treatment of URI. Suggested symptomatic OTC remedies. Nasal saline spray for congestion. Augmentin per orders. Call in 7 days if symptoms aren't resolving.

## 2011-04-27 ENCOUNTER — Ambulatory Visit: Payer: Medicare Other | Admitting: Internal Medicine

## 2011-05-03 ENCOUNTER — Telehealth: Payer: Self-pay | Admitting: Internal Medicine

## 2011-05-03 NOTE — Telephone Encounter (Signed)
Pt states that she has itching on her hands, arms, all over since last week week, worse since Friday.  No rash.  She started dexilant in December, could that be the cause? Ok for her to take benedryl or other antihistamine?

## 2011-05-03 NOTE — Telephone Encounter (Signed)
Ok to take benadryl for the itching.  The only way to know if it is the dexilant is to stop it, and I would hate to taht since it is finally helpin her stomach.  I would try the benadryl for a few days adn see if the itching resolves. Make sure she hasn't changed sopas or detergents, of rubber gloves, etc,

## 2011-05-03 NOTE — Telephone Encounter (Signed)
Advised pt, she will call back in a few days if not better.

## 2011-05-03 NOTE — Telephone Encounter (Signed)
Patient has itching and burning on her hand,back and head.Would like a call from the nurse.

## 2011-05-20 ENCOUNTER — Encounter: Payer: Self-pay | Admitting: Internal Medicine

## 2011-05-20 ENCOUNTER — Telehealth: Payer: Self-pay | Admitting: *Deleted

## 2011-05-20 ENCOUNTER — Ambulatory Visit (INDEPENDENT_AMBULATORY_CARE_PROVIDER_SITE_OTHER): Payer: Medicare Other | Admitting: Internal Medicine

## 2011-05-20 VITALS — BP 118/52 | HR 80 | Temp 98.4°F | Wt 144.0 lb

## 2011-05-20 DIAGNOSIS — L299 Pruritus, unspecified: Secondary | ICD-10-CM | POA: Diagnosis not present

## 2011-05-20 DIAGNOSIS — K2970 Gastritis, unspecified, without bleeding: Secondary | ICD-10-CM | POA: Diagnosis not present

## 2011-05-20 DIAGNOSIS — Z7901 Long term (current) use of anticoagulants: Secondary | ICD-10-CM | POA: Diagnosis not present

## 2011-05-20 DIAGNOSIS — Z1239 Encounter for other screening for malignant neoplasm of breast: Secondary | ICD-10-CM

## 2011-05-20 LAB — CBC WITH DIFFERENTIAL/PLATELET
Basophils Absolute: 0 10*3/uL (ref 0.0–0.1)
Basophils Relative: 0.5 % (ref 0.0–3.0)
Eosinophils Absolute: 0.1 10*3/uL (ref 0.0–0.7)
Eosinophils Relative: 1.9 % (ref 0.0–5.0)
HCT: 34.6 % — ABNORMAL LOW (ref 36.0–46.0)
Hemoglobin: 11.8 g/dL — ABNORMAL LOW (ref 12.0–15.0)
Lymphocytes Relative: 30.1 % (ref 12.0–46.0)
Lymphs Abs: 1.5 10*3/uL (ref 0.7–4.0)
MCHC: 34.1 g/dL (ref 30.0–36.0)
MCV: 91.1 fl (ref 78.0–100.0)
Monocytes Absolute: 0.4 10*3/uL (ref 0.1–1.0)
Monocytes Relative: 8.6 % (ref 3.0–12.0)
Neutro Abs: 3 10*3/uL (ref 1.4–7.7)
Neutrophils Relative %: 58.9 % (ref 43.0–77.0)
Platelets: 160 10*3/uL (ref 150.0–400.0)
RBC: 3.8 Mil/uL — ABNORMAL LOW (ref 3.87–5.11)
RDW: 13 % (ref 11.5–14.6)
WBC: 5.1 10*3/uL (ref 4.5–10.5)

## 2011-05-20 LAB — PROTIME-INR
INR: 2.5 ratio — ABNORMAL HIGH (ref 0.8–1.0)
Prothrombin Time: 27.1 s — ABNORMAL HIGH (ref 10.2–12.4)

## 2011-05-20 LAB — COMPREHENSIVE METABOLIC PANEL
ALT: 12 U/L (ref 0–35)
AST: 19 U/L (ref 0–37)
Albumin: 3.6 g/dL (ref 3.5–5.2)
Alkaline Phosphatase: 79 U/L (ref 39–117)
BUN: 14 mg/dL (ref 6–23)
CO2: 28 mEq/L (ref 19–32)
Calcium: 8.6 mg/dL (ref 8.4–10.5)
Chloride: 105 mEq/L (ref 96–112)
Creatinine, Ser: 1.1 mg/dL (ref 0.4–1.2)
GFR: 52.56 mL/min — ABNORMAL LOW (ref >60.00)
Glucose, Bld: 90 mg/dL (ref 70–99)
Potassium: 4.4 mEq/L (ref 3.5–5.1)
Sodium: 140 mEq/L (ref 135–145)
Total Bilirubin: 0.2 mg/dL — ABNORMAL LOW (ref 0.3–1.2)
Total Protein: 6.7 g/dL (ref 6.0–8.3)

## 2011-05-20 MED ORDER — HYDROXYZINE HCL 25 MG PO TABS: 25.0000 mg | ORAL_TABLET | Freq: Four times a day (QID) | ORAL | Status: AC | PRN

## 2011-05-20 NOTE — Patient Instructions (Signed)
I am recommending  A trial of zyrtec one daily at bedtime.  Continue Eucerin evey day for dry skin.  Atarax for relief of itching,  25 mg every 6 hours if needed

## 2011-05-20 NOTE — Progress Notes (Signed)
Subjective:    [name], female    DOB: 1929/08/02, 78 y.o.   MRN: [id number]  HPI  Alexandra Martinez is a 78 year old white female with a history of chronic abdominal pain secondary to gastritis and irritable bowel syndrome who presents with diffuse itching which became suddenly persistent 2 weeks ago. The pruritus has been affecting her entire body but is more prominent on her trunk and arms. She has not noticed any lesions on her skin but does have several several macular red patches. She has not changed makeup has not started any new medications other than Dexon several weeks ago. She did change laundry detergents ove one month ago but has changed back to her former detergent. Her pruritis feels better after immersing in warm water. She has been  using Eucerin skin cream.   Past Medical History  Diagnosis Date  . Hypothyroidism   . Osteoporosis   . Recurrent UTI   . Hydronephrosis     uretero- with congenital UPJ obstruction- [name]  . GERD (gastroesophageal reflux disease)   . DDD (degenerative disc disease)   . Atrial fib/flutter, transient     [facility] admission for RVR 03/2009  . Hydronephrosis with ureteropelvic junction obstruction     Current Outpatient Prescriptions on File Prior to Visit  Medication Sig Dispense Refill  . diltiazem (DILACOR XR) 180 MG 24 hr capsule Take 180 mg by mouth daily.        Marland Kitchen levothyroxine (SYNTHROID, LEVOTHROID) 50 MCG tablet Take 50 mcg by mouth daily.        . Simethicone (GAS-X PO) Take by mouth as needed       . sucralfate (CARAFATE) 1 G tablet Take 1 tablet (1 g total) by mouth 4 (four) times daily.  120 tablet  3  . Tetrahydrozoline HCl (EYE DROPS OP) Always eye drops- one drop two times daily in both eyes      . warfarin (COUMADIN) 1 MG tablet Take 1 tablet (1 mg total) by mouth daily.  30 tablet  3  . warfarin (COUMADIN) 5 MG tablet Take 1 tablet (5 mg total) by mouth daily.  30 tablet  3      Review of Systems    Constitutional: Negative for fever, chills and unexpected weight change.  HENT: Negative for hearing loss, ear pain, nosebleeds, congestion, sore throat, facial swelling, rhinorrhea, sneezing, mouth sores, trouble swallowing, neck pain, neck stiffness, voice change, postnasal drip, sinus pressure, tinnitus and ear discharge.   Eyes: Negative for pain, discharge, redness and visual disturbance.  Respiratory: Negative for cough, chest tightness, shortness of breath, wheezing and stridor.   Cardiovascular: Negative for chest pain, palpitations and leg swelling.  Musculoskeletal: Negative for myalgias and arthralgias.  Skin: Negative for color change and rash.  Neurological: Negative for dizziness, weakness, light-headedness and headaches.  Hematological: Negative for adenopathy.       Objective:   Physical Exam  Constitutional: She is oriented to person, place, and time. She appears well-developed and well-nourished.  HENT:  Mouth/Throat: Oropharynx is clear and moist.  Eyes: EOM are normal. Pupils are equal, round, and reactive to light. No scleral icterus.  Neck: Normal range of motion. Neck supple. No JVD present. No thyromegaly present.  Cardiovascular: Normal rate, regular rhythm, normal heart sounds and intact distal pulses.   Pulmonary/Chest: Effort normal and breath sounds normal.  Abdominal: Soft. Bowel sounds are normal. She exhibits no mass. There is no tenderness.  Musculoskeletal: Normal range of motion.  She exhibits no edema.  Lymphadenopathy:    She has no cervical adenopathy.  Neurological: She is alert and oriented to person, place, and time.  Skin: Skin is warm and dry. Rash noted. Rash is macular.     Psychiatric: She has a normal mood and affect.          Assessment & Plan:   Gastritis Her symptoms are finally controlled with the use of daily Dexilant, and she did have gastritis on recent by recent EGD done by Dr. Aniceto Boss in December. We did discuss the  possibility of intercurrent pruritus is a reaction to Dexilantd but given the chronic and pervasive nature of her gastritis we did not stop this medication today.  Pruritus Her skin exam is relatively normal. She does not appear to have a contact dermatitis. I suspect that her symptoms are due to anxiety since her family is providing he persistent source of stress and anxiety for her. However we'll check a CBC and with differential and cement to assessment rule out eosinophilia and liver dysfunction. Trial of Atarax, Zyrtec and increased moisturization of skin Eucerin. If no improvement in 2 weeks will refer to dermatology.    Updated Medication List Outpatient Encounter Prescriptions as of 05/20/2011  Medication Sig Dispense Refill  . dexlansoprazole (DEXILANT) 60 MG capsule Take 60 mg by mouth daily.      Marland Kitchen diltiazem (DILACOR XR) 180 MG 24 hr capsule Take 180 mg by mouth daily.        Marland Kitchen levothyroxine (SYNTHROID, LEVOTHROID) 50 MCG tablet Take 50 mcg by mouth daily.        . Simethicone (GAS-X PO) Take by mouth as needed       . sucralfate (CARAFATE) 1 G tablet Take 1 tablet (1 g total) by mouth 4 (four) times daily.  120 tablet  3  . Tetrahydrozoline HCl (EYE DROPS OP) Always eye drops- one drop two times daily in both eyes      . warfarin (COUMADIN) 1 MG tablet Take 1 tablet (1 mg total) by mouth daily.  30 tablet  3  . warfarin (COUMADIN) 5 MG tablet Take 1 tablet (5 mg total) by mouth daily.  30 tablet  3  . hydrOXYzine (ATARAX/VISTARIL) 25 MG tablet Take 1 tablet (25 mg total) by mouth every 6 (six) hours as needed for itching.  30 tablet  3  . DISCONTD: pantoprazole (PROTONIX) 40 MG tablet Take 40 mg by mouth 2 (two) times daily.

## 2011-05-20 NOTE — Telephone Encounter (Signed)
Patient says that she forgot to tell you this morning that she needs an order for mammogram I printed the order, waiting for Dr. Derrel Nip to sign. Patient is going to schedule her own appt. Wants order faxed to [place].   Dr. Derrel Nip singed order and order was faxed. Left message notifying patient that this has been done.

## 2011-05-21 ENCOUNTER — Other Ambulatory Visit: Payer: Self-pay | Admitting: Internal Medicine

## 2011-05-21 DIAGNOSIS — L299 Pruritus, unspecified: Secondary | ICD-10-CM | POA: Insufficient documentation

## 2011-05-21 NOTE — Assessment & Plan Note (Signed)
She is due for PT/INR today on 6 mg of coumadin daily.

## 2011-05-21 NOTE — Assessment & Plan Note (Signed)
Her symptoms are finally controlled with the use of daily Dexilant, and she did have gastritis on recent by recent EGD done by Dr. Aniceto Boss in December. We did discuss the possibility of intercurrent pruritus is a reaction to Dexilantd but given the chronic and pervasive nature of her gastritis we did not stop this medication today.

## 2011-05-21 NOTE — Assessment & Plan Note (Signed)
Her skin exam is relatively normal. She does not appear to have a contact dermatitis. I suspect that her symptoms are due to anxiety since her family is providing he persistent source of stress and anxiety for her. However we'll check a CBC and with differential and cement to assessment rule out eosinophilia and liver dysfunction. Trial of Atarax, Zyrtec and increased moisturization of skin Eucerin. If no improvement in 2 weeks will refer to dermatology.

## 2011-05-25 ENCOUNTER — Ambulatory Visit: Payer: Self-pay | Admitting: Internal Medicine

## 2011-05-25 DIAGNOSIS — Z1231 Encounter for screening mammogram for malignant neoplasm of breast: Secondary | ICD-10-CM | POA: Diagnosis not present

## 2011-05-31 ENCOUNTER — Ambulatory Visit: Payer: Medicare Other | Admitting: Internal Medicine

## 2011-06-02 ENCOUNTER — Other Ambulatory Visit: Payer: Self-pay | Admitting: *Deleted

## 2011-06-02 MED ORDER — WARFARIN SODIUM 5 MG PO TABS: 5.0000 mg | ORAL_TABLET | Freq: Every day | ORAL | Status: DC

## 2011-06-02 MED ORDER — WARFARIN SODIUM 1 MG PO TABS: 1.0000 mg | ORAL_TABLET | Freq: Every day | ORAL | Status: DC

## 2011-06-17 ENCOUNTER — Encounter: Payer: Self-pay | Admitting: Internal Medicine

## 2011-06-17 ENCOUNTER — Ambulatory Visit (INDEPENDENT_AMBULATORY_CARE_PROVIDER_SITE_OTHER): Payer: Medicare Other | Admitting: Internal Medicine

## 2011-06-17 ENCOUNTER — Other Ambulatory Visit: Payer: Medicare Other

## 2011-06-17 VITALS — BP 128/68 | HR 93 | Temp 97.6°F | Resp 16 | Ht 64.0 in | Wt 147.2 lb

## 2011-06-17 DIAGNOSIS — T887XXA Unspecified adverse effect of drug or medicament, initial encounter: Secondary | ICD-10-CM

## 2011-06-17 DIAGNOSIS — T50905A Adverse effect of unspecified drugs, medicaments and biological substances, initial encounter: Secondary | ICD-10-CM | POA: Insufficient documentation

## 2011-06-17 DIAGNOSIS — K2100 Gastro-esophageal reflux disease with esophagitis, without bleeding: Secondary | ICD-10-CM

## 2011-06-17 DIAGNOSIS — Z7901 Long term (current) use of anticoagulants: Secondary | ICD-10-CM

## 2011-06-17 LAB — PROTIME-INR
INR: 2.1 ratio — ABNORMAL HIGH (ref 0.8–1.0)
Prothrombin Time: 23.1 s — ABNORMAL HIGH (ref 10.2–12.4)

## 2011-06-17 MED ORDER — LANSOPRAZOLE 30 MG PO CPDR: 30.0000 mg | DELAYED_RELEASE_CAPSULE | Freq: Two times a day (BID) | ORAL | Status: DC

## 2011-06-17 NOTE — Patient Instructions (Signed)
Increase the benadryl to three times for a few days.  Stop the dexilant and start prevacid (lansoprazole) twice daily for reflux  If the itching continues, we will stop the prevacid and take pepcid 40 mg twice daily (I will call it in)

## 2011-06-17 NOTE — Progress Notes (Signed)
Quick Note:  Your coumadin level was therapeutic. Continue current regimen and repeat a PT/INR in one month ______

## 2011-06-17 NOTE — Assessment & Plan Note (Signed)
Recurrent despite use of dexilant.  Will change to prevacid twice daily as a trial .

## 2011-06-17 NOTE — Progress Notes (Signed)
Subjective:    [name], female    DOB: Sep 08, 1929, 78 y.o.   MRN: [id number]  HPI  Alexandra Martinez returns with chief complaint of drug reaction to dexilant.  She is reporting generalized pruritus and has red spots on arms and legs. She has been taking benadryl during  the day,  Atarax at night,  And using eucerin to moisten skin with no improvement.  Se has also noted that her reflux has returned.   Past Medical History  Diagnosis Date  . Hypothyroidism   . Osteoporosis   . Recurrent UTI   . Hydronephrosis     uretero- with congenital UPJ obstruction- [name]  . GERD (gastroesophageal reflux disease)   . DDD (degenerative disc disease)   . Atrial fib/flutter, transient     [facility] admission for RVR 03/2009  . Hydronephrosis with ureteropelvic junction obstruction    Current Outpatient Prescriptions on File Prior to Visit  Medication Sig Dispense Refill  . dexlansoprazole (DEXILANT) 60 MG capsule Take 60 mg by mouth daily.      Marland Kitchen diltiazem (DILACOR XR) 180 MG 24 hr capsule Take 180 mg by mouth daily.        . Simethicone (GAS-X PO) Take by mouth as needed       . SYNTHROID 50 MCG tablet TAKE ONE TABLET BY MOUTH DAILY  30 tablet  0  . Tetrahydrozoline HCl (EYE DROPS OP) Always eye drops- one drop two times daily in both eyes      . warfarin (COUMADIN) 1 MG tablet Take 1 tablet (1 mg total) by mouth daily.  30 tablet  3  . warfarin (COUMADIN) 5 MG tablet Take 1 tablet (5 mg total) by mouth daily.  30 tablet  3    Review of Systems  Constitutional: Negative for fever, chills and unexpected weight change.  HENT: Negative for hearing loss, ear pain, nosebleeds, congestion, sore throat, facial swelling, rhinorrhea, sneezing, mouth sores, trouble swallowing, neck pain, neck stiffness, voice change, postnasal drip, sinus pressure, tinnitus and ear discharge.   Eyes: Negative for pain, discharge, redness and visual disturbance.  Respiratory: Negative for cough, chest  tightness, shortness of breath, wheezing and stridor.   Cardiovascular: Negative for chest pain, palpitations and leg swelling.  Musculoskeletal: Negative for myalgias and arthralgias.  Skin: Positive for rash. Negative for color change.  Neurological: Negative for dizziness, weakness, light-headedness and headaches.  Hematological: Negative for adenopathy.       Objective:   Physical Exam  Constitutional: She is oriented to person, place, and time. She appears well-developed and well-nourished.  HENT:  Mouth/Throat: Oropharynx is clear and moist.  Eyes: EOM are normal. Pupils are equal, round, and reactive to light. No scleral icterus.  Neck: Normal range of motion. Neck supple. No JVD present. No thyromegaly present.  Cardiovascular: Normal rate, regular rhythm, normal heart sounds and intact distal pulses.   Pulmonary/Chest: Effort normal and breath sounds normal.  Abdominal: Soft. Bowel sounds are normal. She exhibits no mass. There is no tenderness.  Musculoskeletal: Normal range of motion. She exhibits no edema.  Lymphadenopathy:    She has no cervical adenopathy.  Neurological: She is alert and oriented to person, place, and time.  Skin: Skin is warm and dry. Rash noted. Rash is papular.  Psychiatric: She has a normal mood and affect.       Assessment & Plan:   Adverse drug reaction Secondary to Dexilant, which is unfortunate, since it is the only PPI  that has kept her abdominal pain under control.  Will Dc Dexilant.  Esophagitis, reflux Recurrent despite use of dexilant.  Will change to prevacid twice daily as a trial .  Encounter for long-term (current) use of anticoagulants PT/INR is 2.1 today,  No changes needed.    Updated Medication List Outpatient Encounter Prescriptions as of 06/17/2011  Medication Sig Dispense Refill  . dexlansoprazole (DEXILANT) 60 MG capsule Take 60 mg by mouth daily.      Marland Kitchen diltiazem (DILACOR XR) 180 MG 24 hr capsule Take 180 mg by mouth  daily.        . hydrOXYzine (ATARAX/VISTARIL) 25 MG tablet Take 25 mg by mouth at bedtime.      . Simethicone (GAS-X PO) Take by mouth as needed       . SYNTHROID 50 MCG tablet TAKE ONE TABLET BY MOUTH DAILY  30 tablet  0  . Tetrahydrozoline HCl (EYE DROPS OP) Always eye drops- one drop two times daily in both eyes      . warfarin (COUMADIN) 1 MG tablet Take 1 tablet (1 mg total) by mouth daily.  30 tablet  3  . warfarin (COUMADIN) 5 MG tablet Take 1 tablet (5 mg total) by mouth daily.  30 tablet  3  . lansoprazole (PREVACID) 30 MG capsule Take 1 capsule (30 mg total) by mouth 2 (two) times daily.  60 capsule  1  . DISCONTD: sucralfate (CARAFATE) 1 G tablet Take 1 tablet (1 g total) by mouth 4 (four) times daily.  120 tablet  3

## 2011-06-17 NOTE — Assessment & Plan Note (Signed)
PT/INR is 2.1 today,  No changes needed.

## 2011-06-17 NOTE — Assessment & Plan Note (Signed)
Secondary to [place], which is unfortunate, since it is the only PPI that has kept her abdominal pain under control.  Will Dc Dexilant.

## 2011-06-21 ENCOUNTER — Telehealth: Payer: Self-pay | Admitting: Internal Medicine

## 2011-06-21 NOTE — Telephone Encounter (Signed)
Ok

## 2011-06-21 NOTE — Telephone Encounter (Signed)
Patient called and stated she can't tolerate the new medication you prescribed lansoprazole.  She stated it is bothering her like the other medications she has tried, she said she is having itching and burning eyes, she also reported side and back pain but she wasn't sure if that pain was coming from the medication.  She wanted me to let you know she was going to stop taking this medication.

## 2011-06-25 ENCOUNTER — Encounter: Payer: Self-pay | Admitting: Internal Medicine

## 2011-07-01 ENCOUNTER — Other Ambulatory Visit: Payer: Self-pay | Admitting: Internal Medicine

## 2011-07-15 ENCOUNTER — Other Ambulatory Visit (INDEPENDENT_AMBULATORY_CARE_PROVIDER_SITE_OTHER): Payer: Medicare Other | Admitting: *Deleted

## 2011-07-15 DIAGNOSIS — Z7901 Long term (current) use of anticoagulants: Secondary | ICD-10-CM | POA: Diagnosis not present

## 2011-07-15 LAB — PROTIME-INR
INR: 2.2 ratio — ABNORMAL HIGH (ref 0.8–1.0)
Prothrombin Time: 24.7 s — ABNORMAL HIGH (ref 10.2–12.4)

## 2011-07-19 DIAGNOSIS — K219 Gastro-esophageal reflux disease without esophagitis: Secondary | ICD-10-CM | POA: Diagnosis not present

## 2011-07-28 ENCOUNTER — Encounter: Payer: Self-pay | Admitting: Internal Medicine

## 2011-07-28 ENCOUNTER — Ambulatory Visit (INDEPENDENT_AMBULATORY_CARE_PROVIDER_SITE_OTHER): Payer: Medicare Other | Admitting: Internal Medicine

## 2011-07-28 VITALS — BP 142/60 | HR 120 | Temp 98.3°F | Resp 16 | Wt 147.2 lb

## 2011-07-28 DIAGNOSIS — I4891 Unspecified atrial fibrillation: Secondary | ICD-10-CM | POA: Diagnosis not present

## 2011-07-28 DIAGNOSIS — K2970 Gastritis, unspecified, without bleeding: Secondary | ICD-10-CM

## 2011-07-28 DIAGNOSIS — K2990 Gastroduodenitis, unspecified, without bleeding: Secondary | ICD-10-CM

## 2011-07-28 MED ORDER — METOPROLOL TARTRATE 100 MG PO TABS: 100.0000 mg | ORAL_TABLET | Freq: Two times a day (BID) | ORAL | Status: DC

## 2011-07-28 NOTE — Progress Notes (Signed)
[name], female   DOB: 12/18/1929, 78 y.o.   MRN: [id number]

## 2011-07-28 NOTE — Assessment & Plan Note (Addendum)
[facility]  in [facility].  PT/INR due today ,  Rate controlled with diltiazem but we are changing to metoprolol today in order to determine if the itching is coming from the diltiazem

## 2011-07-28 NOTE — Patient Instructions (Signed)
We will resume Dexilant.  We will stop the diltiazem and start metoprolol 100 mg twice daily for atrial fibrillation  Change from to either claritin (loratidine) or allegra (fexofenadine) for 24 hour antihistamine effect.   Try Aveeno with colloidal oatmeal to soothe your skin

## 2011-08-01 ENCOUNTER — Encounter: Payer: Self-pay | Admitting: Internal Medicine

## 2011-08-01 NOTE — Assessment & Plan Note (Signed)
By recent EGD done 12/20 by Iftikhar, with abnormal gastric emptying study results  Several years ago by [place].  Resume Dexilant since her trial of nexum bid has resulted in increased abdominal discomfort

## 2011-08-16 ENCOUNTER — Other Ambulatory Visit (INDEPENDENT_AMBULATORY_CARE_PROVIDER_SITE_OTHER): Payer: Medicare Other | Admitting: *Deleted

## 2011-08-16 DIAGNOSIS — Z7901 Long term (current) use of anticoagulants: Secondary | ICD-10-CM

## 2011-08-16 LAB — PROTIME-INR
INR: 2.5 ratio — ABNORMAL HIGH (ref 0.8–1.0)
Prothrombin Time: 27.7 s — ABNORMAL HIGH (ref 10.2–12.4)

## 2011-08-25 ENCOUNTER — Telehealth: Payer: Self-pay | Admitting: Internal Medicine

## 2011-08-25 DIAGNOSIS — R21 Rash and other nonspecific skin eruption: Secondary | ICD-10-CM | POA: Diagnosis not present

## 2011-08-25 NOTE — Telephone Encounter (Signed)
Caller: Alexandra Martinez/Patient; PCP: Deborra Medina; CB#: [phone];  Call regarding Burning and Itching On Legs; Onset "a while"; Worsening since seen.  Rash on leg and redness on left arm.  States there are red, raised areas and "little hard spots" appearing on lower leg.  Discomfort rated as real bad at times.  Petroleum Jelly reduces itching.  Itching interferes with activity.  See in 4 hours per Leg Non-Injury protocol.  No appointments available this date.  Contacted office and spoke w/ Shirlean Mylar, then [place]; she advised UC or follow up with provider on 08/27/11. Caller states plan to go to [place].  Information noted.

## 2011-08-27 ENCOUNTER — Ambulatory Visit: Payer: Medicare Other | Admitting: Internal Medicine

## 2011-08-31 ENCOUNTER — Ambulatory Visit (INDEPENDENT_AMBULATORY_CARE_PROVIDER_SITE_OTHER): Payer: Medicare Other | Admitting: Internal Medicine

## 2011-08-31 ENCOUNTER — Encounter: Payer: Self-pay | Admitting: Internal Medicine

## 2011-08-31 VITALS — BP 114/64 | HR 80 | Temp 98.4°F | Resp 16 | Wt 148.0 lb

## 2011-08-31 DIAGNOSIS — T887XXA Unspecified adverse effect of drug or medicament, initial encounter: Secondary | ICD-10-CM

## 2011-08-31 DIAGNOSIS — T50905A Adverse effect of unspecified drugs, medicaments and biological substances, initial encounter: Secondary | ICD-10-CM

## 2011-08-31 NOTE — Assessment & Plan Note (Addendum)
I believe the culprit is Dexilant since it acutely worsened after resuming it.  Have recommended stopping the steroid cream and follow up with Dr. Koleen Nimrod in a few weeks. continue hydroxyzine at night and allegra during the day. She had a normal CBC 2 months ago so PCV unlikely though considered given history of increased pruritus after bathing.

## 2011-08-31 NOTE — Progress Notes (Signed)
[name], female   DOB: December 24, 1929, 78 y.o.   MRN: [id number]  Patient Active Problem List  Diagnoses  . Controlled atrial fibrillation  . Irritable bowel syndrome without diarrhea  . Reflux  . Urinary tract infection  . Hypothyroid  . Encounter for long-term (current) use of anticoagulants  . Esophagitis, reflux  . Gastritis  . Pruritus  . Adverse drug reaction    Subjective:  CC:   Chief Complaint  Patient presents with  . Follow-up  . Rash    HPI:   Alexandra Martinez a 78 y.o. female who presents Worsening rash.  Seen one month ago for persistent abdominal pain relieved by Dexilant but had developed a rash  On leg and arm which was believed to e secondary to diltiazem or dexilant. We stopped diltiazem , started metoprolol, which she had tolerated in the past, and resumed dexilant. Her rash has worsened but her abdominal pain has improved. She was recently treated by urgent care with steroid cream and dexilant was stopped  Her abdominal pain has not returned,  Her itching has not improved, and she notices it is worse  after a bath.  She is moisturizing daily with Eucerin, denies any change in detergents or soaps.  No outside yard work or tick exposure.    Past Medical History  Diagnosis Date  . Hypothyroidism   . Osteoporosis   . Recurrent UTI   . Hydronephrosis     uretero- with congenital UPJ obstruction- [name]  . GERD (gastroesophageal reflux disease)   . DDD (degenerative disc disease)   . Atrial fib/flutter, transient     [facility] admission for RVR 03/2009  . Hydronephrosis with ureteropelvic junction obstruction     Past Surgical History  Procedure Date  . Cataract extraction     left  . Cholecystectomy   . Cervical disc surgery 1980's  . Thyroid lobectomy     partial, right lobe         The following portions of the patient's history were reviewed and updated as appropriate: Allergies, current medications, and problem  list.    Review of Systems:   12 Pt  review of systems was negative except those addressed in the HPI,     History   Social History  . Marital Status: Widowed    Spouse Name: N/A    Number of Children: N/A  . Years of Education: N/A   Occupational History  . Not on file.   Social History Main Topics  . Smoking status: Never Smoker   . Smokeless tobacco: Never Used  . Alcohol Use: No  . Drug Use: No  . Sexually Active: Not on file   Other Topics Concern  . Not on file   Social History Narrative   Lives alone, widowed in 2005.Husband was a smoker.    Objective:  BP 114/64  Pulse 80  Temp(Src) 98.4 F (36.9 C) (Oral)  Resp 16  Wt 148 lb (67.132 kg)  SpO2 97%  General appearance: alert, cooperative and appears stated age Ears: normal TM's and external ear canals both ears Throat: lips, mucosa, and tongue normal; teeth and gums normal Neck: no adenopathy, no carotid bruit, supple, symmetrical, trachea midline and thyroid not enlarged, symmetric, no tenderness/mass/nodules Back: symmetric, no curvature. ROM normal. No CVA tenderness. Lungs: clear to auscultation bilaterally Heart: regular rate and rhythm, S1, S2 normal, no murmur, click, rub or gallop Abdomen: soft, non-tender; bowel sounds normal; no masses,  no  organomegaly Pulses: 2+ and symmetric Skin: blanching macular diffuse erythema of RLE and left forearm. No petechiae. Or papules. Lymph nodes: Cervical, supraclavicular, and axillary nodes normal.  Assessment and Plan:  Adverse drug reaction I believe the culprit is Dexilant since it acutely worsened after resuming it.  Have recommended stopping the steroid cream and follow up with Dr. Koleen Nimrod in a few weeks. continue hydroxyzine at night and allegra during the day. She had a normal CBC 2 months ago so PCV unlikely though considered given history of increased pruritus after bathing.     Updated Medication List Outpatient Encounter Prescriptions  as of 08/31/2011  Medication Sig Dispense Refill  . fluticasone (CUTIVATE) 0.05 % cream Apply 1 application topically 3 (three) times daily.      Marland Kitchen levothyroxine (SYNTHROID, LEVOTHROID) 50 MCG tablet TAKE ONE TABLET BY MOUTH DAILY  30 tablet  3  . metoprolol (LOPRESSOR) 100 MG tablet Take 1 tablet (100 mg total) by mouth 2 (two) times daily.  60 tablet  3  . Simethicone (GAS-X PO) Take by mouth as needed       . Tetrahydrozoline HCl (EYE DROPS OP) Always eye drops- one drop two times daily in both eyes      . warfarin (COUMADIN) 1 MG tablet Take 1 tablet (1 mg total) by mouth daily.  30 tablet  3  . warfarin (COUMADIN) 5 MG tablet Take 1 tablet (5 mg total) by mouth daily.  30 tablet  3  . DISCONTD: dexlansoprazole (DEXILANT) 60 MG capsule Take 1 capsule (60 mg total) by mouth daily.  30 capsule  6  . DISCONTD: lansoprazole (PREVACID) 30 MG capsule Take 1 capsule (30 mg total) by mouth 2 (two) times daily.  60 capsule  1     No orders of the defined types were placed in this encounter.    No Follow-up on file.

## 2011-08-31 NOTE — Patient Instructions (Signed)
Use the hydroxyzine up to 3 times daily as needed for itching, it can make you sleepy   Ok to resume allegra during the day for itching.,  ' Stop the steroid cream  I agree with seeing Dr. Nelta Numbers for his opinion.  I think you had a drug reaction to Dexilant/.

## 2011-09-09 DIAGNOSIS — L738 Other specified follicular disorders: Secondary | ICD-10-CM | POA: Diagnosis not present

## 2011-09-09 DIAGNOSIS — L280 Lichen simplex chronicus: Secondary | ICD-10-CM | POA: Diagnosis not present

## 2011-09-14 ENCOUNTER — Ambulatory Visit (INDEPENDENT_AMBULATORY_CARE_PROVIDER_SITE_OTHER): Payer: Medicare Other | Admitting: Internal Medicine

## 2011-09-14 ENCOUNTER — Encounter: Payer: Self-pay | Admitting: Internal Medicine

## 2011-09-14 VITALS — BP 126/72 | HR 82 | Temp 97.9°F | Resp 14 | Wt 147.0 lb

## 2011-09-14 DIAGNOSIS — T887XXA Unspecified adverse effect of drug or medicament, initial encounter: Secondary | ICD-10-CM | POA: Diagnosis not present

## 2011-09-14 DIAGNOSIS — R5381 Other malaise: Secondary | ICD-10-CM

## 2011-09-14 DIAGNOSIS — R5383 Other fatigue: Secondary | ICD-10-CM

## 2011-09-14 DIAGNOSIS — T50905A Adverse effect of unspecified drugs, medicaments and biological substances, initial encounter: Secondary | ICD-10-CM

## 2011-09-14 DIAGNOSIS — Z7901 Long term (current) use of anticoagulants: Secondary | ICD-10-CM

## 2011-09-14 LAB — PROTIME-INR
INR: 2.14 — ABNORMAL HIGH (ref <1.50)
Prothrombin Time: 24.6 seconds — ABNORMAL HIGH (ref 11.6–15.2)

## 2011-09-14 MED ORDER — MIRTAZAPINE 7.5 MG PO TABS: 7.5000 mg | ORAL_TABLET | Freq: Every day | ORAL | Status: DC

## 2011-09-14 NOTE — Progress Notes (Signed)
[name], female   DOB: 1930/03/08, 78 y.o.   MRN: [id number]  Patient Active Problem List  Diagnoses  . Controlled atrial fibrillation  . Irritable bowel syndrome without diarrhea  . Reflux  . Urinary tract infection  . Hypothyroid  . Encounter for long-term (current) use of anticoagulants  . Esophagitis, reflux  . Gastritis  . Pruritus  . Adverse drug reaction  . Fatigue    Subjective:  CC:   Chief Complaint  Patient presents with  . Follow-up    2 week    HPI:   [name] a 78 y.o. female who presents  Follow up on rash, new complaint of fatigue.  She saw Dr Tally Due last week,  Was given a steroid shot for "dermatitis" and rx'd otc sarna lotion and steroid cream. Has been applying cold compressess to rash on leg.  improved somewhat but still very red.  Has no energy, not exercising or even going for a walk .  Sleeping well.  Taking atarax at night and it is causing her to feel too sedated in the morning.  Appetite poor.  Not leaving the house much.  Socially isolated .  Not motivated for even a daily walk.   Prior focus on abdominal pain now shifted to leg and constitutional symptoms.   Past Medical History  Diagnosis Date  . Hypothyroidism   . Osteoporosis   . Recurrent UTI   . Hydronephrosis     uretero- with congenital UPJ obstruction- [name]  . GERD (gastroesophageal reflux disease)   . DDD (degenerative disc disease)   . Atrial fib/flutter, transient     [facility] admission for RVR 03/2009  . Hydronephrosis with ureteropelvic junction obstruction     Past Surgical History  Procedure Date  . Cataract extraction     left  . Cholecystectomy   . Cervical disc surgery 1980's  . Thyroid lobectomy     partial, right lobe         The following portions of the patient's history were reviewed and updated as appropriate: Allergies, current medications, and problem list.    Review of Systems:   12 Pt  review of systems was  negative except those addressed in the HPI,     History   Social History  . Marital Status: Widowed    Spouse Name: N/A    Number of Children: N/A  . Years of Education: N/A   Occupational History  . Not on file.   Social History Main Topics  . Smoking status: Never Smoker   . Smokeless tobacco: Never Used  . Alcohol Use: No  . Drug Use: No  . Sexually Active: Not on file   Other Topics Concern  . Not on file   Social History Narrative   Lives alone, widowed in 2005.Husband was a smoker.    Objective:  BP 126/72  Pulse 82  Temp(Src) 97.9 F (36.6 C) (Oral)  Resp 14  Wt 147 lb (66.679 kg)  SpO2 97%  General appearance: alert, cooperative and appears stated age Ears: normal TM's and external ear canals both ears Throat: lips, mucosa, and tongue normal; teeth and gums normal Neck: no adenopathy, no carotid bruit, supple, symmetrical, trachea midline and thyroid not enlarged, symmetric, no tenderness/mass/nodules Back: symmetric, no curvature. ROM normal. No CVA tenderness. Lungs: clear to auscultation bilaterally Heart: regular rate and rhythm, S1, S2 normal, no murmur, click, rub or gallop Abdomen: soft, non-tender; bowel sounds normal; no masses,  no organomegaly Pulses: 2+ and symmetric Skin: large blancing macular rash RE.  Lymph nodes: Cervical, supraclavicular, and axillary nodes normal.  Assessment and Plan:  Fatigue Accompanied by decreased appetite,  Poor sleep, lack of motivation,  Some social isolation.  Discussed trial of low dose remeron. Starting  With 1/2 of a 7.5 mg tablet daily at bedtime.  Encouraged to go for a daily walk.  She has had a sleep study in the past 5 years, results not available but requested. Return in one month.  Adverse drug reaction She has had a fiexed drug reaction to dexilant .  The rash is on her right LE and has been evaluated by Dermatology wand treated with IM steroid  and topical cortisone     Updated Medication  List Outpatient Encounter Prescriptions as of 09/14/2011  Medication Sig Dispense Refill  . fluticasone (CUTIVATE) 0.05 % cream Apply 1 application topically 3 (three) times daily.      Marland Kitchen levothyroxine (SYNTHROID, LEVOTHROID) 50 MCG tablet TAKE ONE TABLET BY MOUTH DAILY  30 tablet  3  . metoprolol (LOPRESSOR) 100 MG tablet Take 1 tablet (100 mg total) by mouth 2 (two) times daily.  60 tablet  3  . Simethicone (GAS-X PO) Take by mouth as needed       . Tetrahydrozoline HCl (EYE DROPS OP) Always eye drops- one drop two times daily in both eyes      . warfarin (COUMADIN) 1 MG tablet Take 1 tablet (1 mg total) by mouth daily.  30 tablet  3  . warfarin (COUMADIN) 5 MG tablet Take 1 tablet (5 mg total) by mouth daily.  30 tablet  3  . mirtazapine (REMERON) 7.5 MG tablet Take 1 tablet (7.5 mg total) by mouth at bedtime.  30 tablet  1     Orders Placed This Encounter  Procedures  . TSH  . INR/PT  . Comp Met (CMET)  . CBC with Differential    Return in about 3 weeks (around 10/05/2011).

## 2011-09-14 NOTE — Patient Instructions (Signed)
I am starting you on mirtazapine for your problems with sleep  Start with 1/2 tablet at bedtime,  Increase to 1 whole tablet in one week  Please sign a medical  release form so we can get your sleep study results  Return in 3 weeks

## 2011-09-15 ENCOUNTER — Other Ambulatory Visit: Payer: Medicare Other

## 2011-09-15 LAB — COMPREHENSIVE METABOLIC PANEL
ALT: 10 U/L (ref 0–35)
AST: 17 U/L (ref 0–37)
Albumin: 3.9 g/dL (ref 3.5–5.2)
Alkaline Phosphatase: 85 U/L (ref 39–117)
BUN: 17 mg/dL (ref 6–23)
CO2: 26 mEq/L (ref 19–32)
Calcium: 8.8 mg/dL (ref 8.4–10.5)
Chloride: 106 mEq/L (ref 96–112)
Creat: 0.93 mg/dL (ref 0.50–1.10)
Glucose, Bld: 81 mg/dL (ref 70–99)
Potassium: 4.7 mEq/L (ref 3.5–5.3)
Sodium: 139 mEq/L (ref 135–145)
Total Bilirubin: 0.4 mg/dL (ref 0.3–1.2)
Total Protein: 6.4 g/dL (ref 6.0–8.3)

## 2011-09-15 LAB — CBC WITH DIFFERENTIAL/PLATELET
Basophils Absolute: 0 10*3/uL (ref 0.0–0.1)
Basophils Relative: 0 % (ref 0–1)
Eosinophils Absolute: 0.2 10*3/uL (ref 0.0–0.7)
Eosinophils Relative: 3 % (ref 0–5)
HCT: 35.1 % — ABNORMAL LOW (ref 36.0–46.0)
Hemoglobin: 11.7 g/dL — ABNORMAL LOW (ref 12.0–15.0)
Lymphocytes Relative: 24 % (ref 12–46)
Lymphs Abs: 1.6 10*3/uL (ref 0.7–4.0)
MCH: 29.6 pg (ref 26.0–34.0)
MCHC: 33.3 g/dL (ref 30.0–36.0)
MCV: 88.9 fL (ref 78.0–100.0)
Monocytes Absolute: 0.7 10*3/uL (ref 0.1–1.0)
Monocytes Relative: 10 % (ref 3–12)
Neutro Abs: 4.2 10*3/uL (ref 1.7–7.7)
Neutrophils Relative %: 63 % (ref 43–77)
Platelets: 173 10*3/uL (ref 150–400)
RBC: 3.95 MIL/uL (ref 3.87–5.11)
RDW: 14 % (ref 11.5–15.5)
WBC: 6.7 10*3/uL (ref 4.0–10.5)

## 2011-09-15 LAB — TSH: TSH: 2.197 u[IU]/mL (ref 0.350–4.500)

## 2011-09-16 ENCOUNTER — Encounter: Payer: Self-pay | Admitting: Internal Medicine

## 2011-09-16 DIAGNOSIS — R5383 Other fatigue: Secondary | ICD-10-CM | POA: Insufficient documentation

## 2011-09-16 NOTE — Assessment & Plan Note (Signed)
She has had a fiexed drug reaction to dexilant .  The rash is on her right LE and has been evaluated by Dermatology wand treated with IM steroid  and topical cortisone

## 2011-09-16 NOTE — Assessment & Plan Note (Signed)
Accompanied by decreased appetite,  Poor sleep, lack of motivation,  Some social isolation.  Discussed trial of low dose remeron. Starting  With 1/2 of a 7.5 mg tablet daily at bedtime.  Encouraged to go for a daily walk.  She has had a sleep study in the past 5 years, results not available but requested. Return in one month.

## 2011-09-30 DIAGNOSIS — L819 Disorder of pigmentation, unspecified: Secondary | ICD-10-CM | POA: Diagnosis not present

## 2011-09-30 DIAGNOSIS — L570 Actinic keratosis: Secondary | ICD-10-CM | POA: Diagnosis not present

## 2011-09-30 DIAGNOSIS — D239 Other benign neoplasm of skin, unspecified: Secondary | ICD-10-CM | POA: Diagnosis not present

## 2011-09-30 DIAGNOSIS — L280 Lichen simplex chronicus: Secondary | ICD-10-CM | POA: Diagnosis not present

## 2011-09-30 DIAGNOSIS — L821 Other seborrheic keratosis: Secondary | ICD-10-CM | POA: Diagnosis not present

## 2011-10-05 ENCOUNTER — Other Ambulatory Visit: Payer: Self-pay | Admitting: Internal Medicine

## 2011-10-05 MED ORDER — WARFARIN SODIUM 6 MG PO TABS: 6.0000 mg | ORAL_TABLET | Freq: Every day | ORAL | Status: DC

## 2011-10-06 ENCOUNTER — Ambulatory Visit: Payer: Medicare Other | Admitting: Internal Medicine

## 2011-10-06 ENCOUNTER — Ambulatory Visit (INDEPENDENT_AMBULATORY_CARE_PROVIDER_SITE_OTHER): Payer: Medicare Other | Admitting: Internal Medicine

## 2011-10-06 ENCOUNTER — Encounter: Payer: Self-pay | Admitting: Internal Medicine

## 2011-10-06 VITALS — BP 128/70 | HR 86 | Temp 97.8°F | Resp 14 | Wt 146.0 lb

## 2011-10-06 DIAGNOSIS — Z7901 Long term (current) use of anticoagulants: Secondary | ICD-10-CM

## 2011-10-06 DIAGNOSIS — T50905A Adverse effect of unspecified drugs, medicaments and biological substances, initial encounter: Secondary | ICD-10-CM

## 2011-10-06 DIAGNOSIS — T887XXA Unspecified adverse effect of drug or medicament, initial encounter: Secondary | ICD-10-CM

## 2011-10-06 DIAGNOSIS — K2100 Gastro-esophageal reflux disease with esophagitis, without bleeding: Secondary | ICD-10-CM | POA: Diagnosis not present

## 2011-10-06 DIAGNOSIS — R5381 Other malaise: Secondary | ICD-10-CM | POA: Diagnosis not present

## 2011-10-06 DIAGNOSIS — R5383 Other fatigue: Secondary | ICD-10-CM

## 2011-10-06 MED ORDER — RANITIDINE HCL 150 MG PO TABS: 150.0000 mg | ORAL_TABLET | Freq: Two times a day (BID) | ORAL | Status: DC

## 2011-10-06 NOTE — Progress Notes (Signed)
[name], female   DOB: 11/03/1929, 78 y.o.   MRN: [id number]  Patient Active Problem List  Diagnosis  . Controlled atrial fibrillation  . Irritable bowel syndrome without diarrhea  . Reflux  . Urinary tract infection  . Hypothyroid  . Encounter for long-term (current) use of anticoagulants  . Esophagitis, reflux  . Gastritis  . Pruritus  . Adverse drug reaction  . Fatigue    Subjective:  CC:   Chief Complaint  Patient presents with  . Follow-up    HPI:   Alexandra Martinez a 78 y.o. female who presents for follow up on multiple issues:  1) fixed drug reaction on RLE to [place] resulting in a rash to her RLE 2) Fatigue a 3) chronic abdominal pai . The rash has iImproved with an  IM steroid shot by dermatologist.  It is less red and does not itch.  She has not had to take extra prednisone. 2) She has been taking tagamet OTC every morning  and has not had any abdominal pain or burning in several weeks. 3) She did not tolerate 1/2 of 7.5 mg tablet of mirtazipine bc it caused her to feel dizzy.  The fatigue comes and goes.  She will have a good day,  In which she cleans her house or works in her yard,  Followed by a day of fatigue.  She denies chest pain, dyspnea and wt loss.    Past Medical History  Diagnosis Date  . Hypothyroidism   . Osteoporosis   . Recurrent UTI   . Hydronephrosis     uretero- with congenital UPJ obstruction- [name]  . GERD (gastroesophageal reflux disease)   . DDD (degenerative disc disease)   . Atrial fib/flutter, transient     [facility] admission for RVR 03/2009  . Hydronephrosis with ureteropelvic junction obstruction     Past Surgical History  Procedure Date  . Cataract extraction     left  . Cholecystectomy   . Cervical disc surgery 1980's  . Thyroid lobectomy     partial, right lobe         The following portions of the patient's history were reviewed and updated as appropriate: Allergies, current medications, and  problem list.    Review of Systems:   12 Pt  review of systems was negative except those addressed in the HPI,     History   Social History  . Marital Status: Widowed    Spouse Name: N/A    Number of Children: N/A  . Years of Education: N/A   Occupational History  . Not on file.   Social History Main Topics  . Smoking status: Never Smoker   . Smokeless tobacco: Never Used  . Alcohol Use: No  . Drug Use: No  . Sexually Active: Not on file   Other Topics Concern  . Not on file   Social History Narrative   Lives alone, widowed in 2005.Husband was a smoker.    Objective:  BP 128/70  Pulse 86  Temp 97.8 F (36.6 C) (Oral)  Resp 14  Wt 146 lb (66.225 kg)  SpO2 98%  General appearance: alert, cooperative and appears stated age Ears: normal TM's and external ear canals both ears Throat: lips, mucosa, and tongue normal; teeth and gums normal Neck: no adenopathy, no carotid bruit, supple, symmetrical, trachea midline and thyroid not enlarged, symmetric, no tenderness/mass/nodules Back: symmetric, no curvature. ROM normal. No CVA tenderness. Lungs: clear to auscultation bilaterally Heart:  regular rate and rhythm, S1, S2 normal, no murmur, click, rub or gallop Abdomen: soft, non-tender; bowel sounds normal; no masses,  no organomegaly Pulses: 2+ and symmetric Skin: right LE macular patch with resolving erythema  Lymph nodes: Cervical, supraclavicular, and axillary nodes normal.  Assessment and Plan:  Esophagitis, reflux currently controlled with tagamet twice daily  Adverse drug reaction secondary to dexilant.  Now improving and a steroid injection  Fatigue Multifactorial, secondary to atrial fibrillation and deconditioning and age.  She has no signs of heart failure one exa.,   Encounter for long-term (current) use of anticoagulants She is taking coumadin for anticoagulation and is due in 2 weeks for repeat inr,    Updated Medication List Outpatient  Encounter Prescriptions as of 10/06/2011  Medication Sig Dispense Refill  . fluticasone (CUTIVATE) 0.05 % cream Apply 1 application topically 3 (three) times daily.      Marland Kitchen levothyroxine (SYNTHROID, LEVOTHROID) 50 MCG tablet TAKE ONE TABLET BY MOUTH DAILY  30 tablet  3  . metoprolol (LOPRESSOR) 100 MG tablet Take 1 tablet (100 mg total) by mouth 2 (two) times daily.  60 tablet  3  . Simethicone (GAS-X PO) Take by mouth as needed       . Tetrahydrozoline HCl (EYE DROPS OP) Always eye drops- one drop two times daily in both eyes      . warfarin (COUMADIN) 6 MG tablet Take 1 tablet (6 mg total) by mouth daily.  30 tablet  3  . ranitidine (ZANTAC) 150 MG tablet Take 1 tablet (150 mg total) by mouth 2 (two) times daily.  90 tablet  3  . DISCONTD: mirtazapine (REMERON) 7.5 MG tablet Take 1 tablet (7.5 mg total) by mouth at bedtime.  30 tablet  1  . DISCONTD: warfarin (COUMADIN) 1 MG tablet Take 1 tablet (1 mg total) by mouth daily.  30 tablet  3  . DISCONTD: warfarin (COUMADIN) 5 MG tablet Take 1 tablet (5 mg total) by mouth daily.  30 tablet  3     No orders of the defined types were placed in this encounter.    Return in about 3 months (around 01/06/2012).

## 2011-10-07 DIAGNOSIS — I4891 Unspecified atrial fibrillation: Secondary | ICD-10-CM | POA: Diagnosis not present

## 2011-10-07 DIAGNOSIS — I10 Essential (primary) hypertension: Secondary | ICD-10-CM | POA: Diagnosis not present

## 2011-10-07 DIAGNOSIS — Z7901 Long term (current) use of anticoagulants: Secondary | ICD-10-CM | POA: Diagnosis not present

## 2011-10-07 DIAGNOSIS — I059 Rheumatic mitral valve disease, unspecified: Secondary | ICD-10-CM | POA: Diagnosis not present

## 2011-10-09 NOTE — Assessment & Plan Note (Signed)
currently controlled with tagamet twice daily

## 2011-10-09 NOTE — Assessment & Plan Note (Signed)
secondary to dexilant.  Now improving and a steroid injection

## 2011-10-09 NOTE — Assessment & Plan Note (Signed)
Multifactorial, secondary to atrial fibrillation and deconditioning and age.  She has no signs of heart failure one exa.,

## 2011-10-09 NOTE — Assessment & Plan Note (Signed)
She is taking coumadin for anticoagulation and is due in 2 weeks for repeat inr,

## 2011-10-15 ENCOUNTER — Other Ambulatory Visit: Payer: Medicare Other

## 2011-10-18 ENCOUNTER — Other Ambulatory Visit (INDEPENDENT_AMBULATORY_CARE_PROVIDER_SITE_OTHER): Payer: Medicare Other | Admitting: *Deleted

## 2011-10-18 DIAGNOSIS — Z7901 Long term (current) use of anticoagulants: Secondary | ICD-10-CM

## 2011-10-18 LAB — PROTIME-INR
INR: 3.5 ratio — ABNORMAL HIGH (ref 0.8–1.0)
Prothrombin Time: 39.3 s — ABNORMAL HIGH (ref 10.2–12.4)

## 2011-10-19 ENCOUNTER — Other Ambulatory Visit: Payer: Self-pay | Admitting: *Deleted

## 2011-10-19 MED ORDER — WARFARIN SODIUM 5 MG PO TABS: 5.0000 mg | ORAL_TABLET | Freq: Every day | ORAL | Status: DC

## 2011-10-20 ENCOUNTER — Ambulatory Visit (INDEPENDENT_AMBULATORY_CARE_PROVIDER_SITE_OTHER): Payer: Medicare Other | Admitting: Internal Medicine

## 2011-10-20 ENCOUNTER — Other Ambulatory Visit: Payer: Self-pay | Admitting: *Deleted

## 2011-10-20 DIAGNOSIS — Z0289 Encounter for other administrative examinations: Secondary | ICD-10-CM

## 2011-10-20 DIAGNOSIS — N390 Urinary tract infection, site not specified: Secondary | ICD-10-CM

## 2011-10-20 LAB — POCT URINALYSIS DIPSTICK
Bilirubin, UA: NEGATIVE
Glucose, UA: NEGATIVE
Nitrite, UA: NEGATIVE
Protein, UA: 100
Spec Grav, UA: 1.025
Urobilinogen, UA: NEGATIVE
pH, UA: 5.5

## 2011-10-20 MED ORDER — CIPROFLOXACIN HCL 250 MG PO TABS: 250.0000 mg | ORAL_TABLET | Freq: Two times a day (BID) | ORAL | Status: DC

## 2011-10-22 LAB — URINE CULTURE

## 2011-10-23 ENCOUNTER — Encounter: Payer: Self-pay | Admitting: Internal Medicine

## 2011-10-23 NOTE — Assessment & Plan Note (Signed)
Suggested by history of frequency, supported by dipstick UA>  Empiric ciprofloxacin prescribed.

## 2011-10-23 NOTE — Progress Notes (Signed)
Patient Active Problem List  Diagnosis  . Controlled atrial fibrillation  . Irritable bowel syndrome without diarrhea  . Reflux  . Urinary tract infection  . Hypothyroid  . Encounter for long-term (current) use of anticoagulants  . Esophagitis, reflux  . Gastritis  . Pruritus  . Adverse drug reaction  . Fatigue    Subjective:  CC:   No chief complaint on file.   HPI:   Alexandra Martinez a 78 y.o. female who presents Urinary frequency of 24 to 48 hours duration. No back pain, nausea, or fevers.    Past Medical History  Diagnosis Date  . Hypothyroidism   . Osteoporosis   . Recurrent UTI   . Hydronephrosis     uretero- with congenital UPJ obstruction- [name]  . GERD (gastroesophageal reflux disease)   . DDD (degenerative disc disease)   . Atrial fib/flutter, transient     [facility] admission for RVR 03/2009  . Hydronephrosis with ureteropelvic junction obstruction     Past Surgical History  Procedure Date  . Cataract extraction     left  . Cholecystectomy   . Cervical disc surgery 1980's  . Thyroid lobectomy     partial, right lobe    The following portions of the patient's history were reviewed and updated as appropriate: Allergies, current medications, and problem list.    History   Social History  . Marital Status: Widowed    Spouse Name: N/A    Number of Children: N/A  . Years of Education: N/A   Occupational History  . Not on file.   Social History Main Topics  . Smoking status: Never Smoker   . Smokeless tobacco: Never Used  . Alcohol Use: No  . Drug Use: No  . Sexually Active: Not on file   Other Topics Concern  . Not on file   Social History Narrative   Lives alone, widowed in 2005.Husband was a smoker.    Objective:  There were no vitals taken for this visit. No exam was done  Assessment and Plan:  Urinary tract infection Suggested by history of frequency, supported by dipstick UA>  Empiric ciprofloxacin prescribed.      Updated Medication List Outpatient Encounter Prescriptions as of 10/20/2011  Medication Sig Dispense Refill  . fluticasone (CUTIVATE) 0.05 % cream Apply 1 application topically 3 (three) times daily.      Marland Kitchen levothyroxine (SYNTHROID, LEVOTHROID) 50 MCG tablet TAKE ONE TABLET BY MOUTH DAILY  30 tablet  3  . metoprolol (LOPRESSOR) 100 MG tablet Take 1 tablet (100 mg total) by mouth 2 (two) times daily.  60 tablet  3  . ranitidine (ZANTAC) 150 MG tablet Take 1 tablet (150 mg total) by mouth 2 (two) times daily.  90 tablet  3  . Simethicone (GAS-X PO) Take by mouth as needed       . Tetrahydrozoline HCl (EYE DROPS OP) Always eye drops- one drop two times daily in both eyes      . warfarin (COUMADIN) 5 MG tablet Take 1 tablet (5 mg total) by mouth daily.  30 tablet  3  . warfarin (COUMADIN) 6 MG tablet Take 1 tablet (6 mg total) by mouth daily.  30 tablet  3     Orders Placed This Encounter  Procedures  . Urine culture  . POCT Urinalysis Dipstick    No Follow-up on file.       [name], female   DOB: December 19, 1929, 78 y.o.  MRN: [id number]

## 2011-10-27 ENCOUNTER — Other Ambulatory Visit: Payer: Self-pay | Admitting: Internal Medicine

## 2011-10-29 ENCOUNTER — Other Ambulatory Visit (INDEPENDENT_AMBULATORY_CARE_PROVIDER_SITE_OTHER): Payer: Medicare Other | Admitting: *Deleted

## 2011-10-29 DIAGNOSIS — Z7901 Long term (current) use of anticoagulants: Secondary | ICD-10-CM

## 2011-10-29 LAB — PROTIME-INR
INR: 2.1 ratio — ABNORMAL HIGH (ref 0.8–1.0)
Prothrombin Time: 22.9 s — ABNORMAL HIGH (ref 10.2–12.4)

## 2011-11-04 ENCOUNTER — Telehealth: Payer: Self-pay | Admitting: *Deleted

## 2011-11-04 DIAGNOSIS — K219 Gastro-esophageal reflux disease without esophagitis: Secondary | ICD-10-CM

## 2011-11-04 MED ORDER — ESOMEPRAZOLE MAGNESIUM 40 MG PO CPDR: 40.0000 mg | DELAYED_RELEASE_CAPSULE | Freq: Every day | ORAL | Status: DC

## 2011-11-04 NOTE — Telephone Encounter (Signed)
nexium rx sent in

## 2011-11-04 NOTE — Telephone Encounter (Signed)
Received a call from [facility] stating that patients insurance does not cover Prevacid.  It covers Omeprazole or Nexium.  Please advise.

## 2011-11-08 ENCOUNTER — Other Ambulatory Visit: Payer: Medicare Other

## 2011-11-21 ENCOUNTER — Other Ambulatory Visit: Payer: Self-pay | Admitting: Internal Medicine

## 2011-11-23 ENCOUNTER — Telehealth: Payer: Self-pay | Admitting: Internal Medicine

## 2011-11-23 NOTE — Telephone Encounter (Signed)
Caller: Alexandra Martinez; PCP: Deborra Medina; CB#: [phone]; ; ; Call regarding On Coumadin; New Medication; states new onset fatigue, weakness, and sore throat noted.  Started nexium recently for reflux and believes this may be the cause of her symptoms.  Afebrile.  Sore throat present > 7 days.  Per protocol, emergent symptoms denied; advised appointment within 24 hours.  Appointment scheduled 11/24/11 0930 with Dr. Derrel Nip.

## 2011-11-24 ENCOUNTER — Encounter: Payer: Self-pay | Admitting: Internal Medicine

## 2011-11-24 ENCOUNTER — Ambulatory Visit (INDEPENDENT_AMBULATORY_CARE_PROVIDER_SITE_OTHER): Payer: Medicare Other | Admitting: Internal Medicine

## 2011-11-24 VITALS — BP 132/66 | HR 76 | Temp 97.8°F | Resp 16 | Wt 143.5 lb

## 2011-11-24 DIAGNOSIS — E538 Deficiency of other specified B group vitamins: Secondary | ICD-10-CM

## 2011-11-24 DIAGNOSIS — K449 Diaphragmatic hernia without obstruction or gangrene: Secondary | ICD-10-CM

## 2011-11-24 DIAGNOSIS — K2990 Gastroduodenitis, unspecified, without bleeding: Secondary | ICD-10-CM

## 2011-11-24 DIAGNOSIS — R5381 Other malaise: Secondary | ICD-10-CM

## 2011-11-24 DIAGNOSIS — K2100 Gastro-esophageal reflux disease with esophagitis, without bleeding: Secondary | ICD-10-CM

## 2011-11-24 DIAGNOSIS — Z7901 Long term (current) use of anticoagulants: Secondary | ICD-10-CM | POA: Diagnosis not present

## 2011-11-24 DIAGNOSIS — K219 Gastro-esophageal reflux disease without esophagitis: Secondary | ICD-10-CM

## 2011-11-24 DIAGNOSIS — R5383 Other fatigue: Secondary | ICD-10-CM | POA: Diagnosis not present

## 2011-11-24 DIAGNOSIS — K2970 Gastritis, unspecified, without bleeding: Secondary | ICD-10-CM

## 2011-11-24 LAB — CBC WITH DIFFERENTIAL/PLATELET
Basophils Absolute: 0 10*3/uL (ref 0.0–0.1)
Basophils Relative: 0.5 % (ref 0.0–3.0)
Eosinophils Absolute: 0.1 10*3/uL (ref 0.0–0.7)
Eosinophils Relative: 1.4 % (ref 0.0–5.0)
HCT: 35.2 % — ABNORMAL LOW (ref 36.0–46.0)
Hemoglobin: 11.9 g/dL — ABNORMAL LOW (ref 12.0–15.0)
Lymphocytes Relative: 26.4 % (ref 12.0–46.0)
Lymphs Abs: 1.3 10*3/uL (ref 0.7–4.0)
MCHC: 33.7 g/dL (ref 30.0–36.0)
MCV: 91.8 fl (ref 78.0–100.0)
Monocytes Absolute: 0.5 10*3/uL (ref 0.1–1.0)
Monocytes Relative: 9.1 % (ref 3.0–12.0)
Neutro Abs: 3.2 10*3/uL (ref 1.4–7.7)
Neutrophils Relative %: 62.6 % (ref 43.0–77.0)
Platelets: 145 10*3/uL — ABNORMAL LOW (ref 150.0–400.0)
RBC: 3.83 Mil/uL — ABNORMAL LOW (ref 3.87–5.11)
RDW: 14.1 % (ref 11.5–14.6)
WBC: 5 10*3/uL (ref 4.5–10.5)

## 2011-11-24 LAB — VITAMIN B12: Vitamin B-12: 412 pg/mL (ref 211–911)

## 2011-11-24 LAB — PROTIME-INR
INR: 2.5 ratio — ABNORMAL HIGH (ref 0.8–1.0)
Prothrombin Time: 27.7 s — ABNORMAL HIGH (ref 10.2–12.4)

## 2011-11-24 MED ORDER — CYANOCOBALAMIN 1000 MCG/ML IJ SOLN
1000.0000 ug | Freq: Once | INTRAMUSCULAR | Status: AC
  Administered 2011-11-24: 1000 ug via INTRAMUSCULAR

## 2011-11-24 NOTE — Progress Notes (Signed)
[name], female   DOB: 09/15/1929, 78 y.o.   MRN: [id number]  Patient Active Problem List  Diagnosis  . Controlled atrial fibrillation  . Irritable bowel syndrome without diarrhea  . Hypothyroid  . Encounter for long-term (current) use of anticoagulants  . Gastritis  . Pruritus  . Adverse drug reaction  . Fatigue  . Hiatal hernia with gastroesophageal reflux    Subjective:  CC:   Chief Complaint  Patient presents with  . Sore Throat  . Gastrophageal Reflux    HPI:   ALYESE Martinez a 78 y.o. female who presents Chronic complaints of abdominal pain and reflux. Today she states that she feels so  bad that she has been too tired to do anything for the last 3 weeks. She states that her reflux and nausea has been worse since stopping Dexilant due to fixed drug reaction (rash on right leg) and resuming Nexium once daily with Zantac  twice daily.  She wakes up in the morning with a sore throat and has frequent nausea and abdominal pain. No diarrhea no constipation no weight loss /. She's also reporting back pain and side pain with household activities and has to rest several times just finished her housework. She denies shortness of breath and rapid heart rate. She does have a history of atrial fibrillation but this appears to be well controlled. She lives alone.   Past Medical History  Diagnosis Date  . Hypothyroidism   . Osteoporosis   . Recurrent UTI   . Hydronephrosis     uretero- with congenital UPJ obstruction- [name]  . GERD (gastroesophageal reflux disease)   . DDD (degenerative disc disease)   . Atrial fib/flutter, transient     [facility] admission for RVR 03/2009  . Hydronephrosis with ureteropelvic junction obstruction   . Hiatal hernia with gastroesophageal reflux 2008    by EGD,  [name]    Past Surgical History  Procedure Date  . Cataract extraction     left  . Cholecystectomy   . Cervical disc surgery 1980's  . Thyroid lobectomy    partial, right lobe  . Total abdominal hysterectomy w/ bilateral salpingoophorectomy          The following portions of the patient's history were reviewed and updated as appropriate: Allergies, current medications, and problem list.    Review of Systems:   Fatigue, sorethraot,  Nausea,  Back pain,  . Rest of comprehensive 12 Pt  review of systems was negative except those addressed in the HPI,     History   Social History  . Marital Status: Widowed    Spouse Name: N/A    Number of Children: N/A  . Years of Education: N/A   Occupational History  . Not on file.   Social History Main Topics  . Smoking status: Never Smoker   . Smokeless tobacco: Never Used  . Alcohol Use: No  . Drug Use: No  . Sexually Active: Not on file   Other Topics Concern  . Not on file   Social History Narrative   Lives alone, widowed in 2005.Alexandra Martinez was a smoker.    Objective:  BP 132/66  Pulse 76  Temp 97.8 F (36.6 C) (Oral)  Resp 16  Wt 143 lb 8 oz (65.091 kg)  SpO2 98%  General appearance: alert, cooperative and appears stated age Ears: normal TM's and external ear canals both ears Throat: lips, mucosa, and tongue normal; teeth and gums normal Neck: no adenopathy,  no carotid bruit, supple, symmetrical, trachea midline and thyroid not enlarged, symmetric, no tenderness/mass/nodules Back: symmetric, no curvature. ROM normal. No CVA tenderness. Lungs: clear to auscultation bilaterally Heart: regular rate and rhythm, S1, S2 normal, no murmur, click, rub or gallop Abdomen: soft, non-tender; bowel sounds normal; no masses,  no organomegaly Pulses: 2+ and symmetric Skin: Skin color, texture, turgor normal. No rashes or lesions Lymph nodes: Cervical, supraclavicular, and axillary nodes normal.  Assessment and Plan:  Hiatal hernia with gastroesophageal reflux Unclear whether her chronic recurrent symptoms are due to her will bowel or to her hiatal hernia with persistent reflux. I  cannot seem to get her symptoms under control with PPI therapy given her recent drug reaction to  Dexilant.  Her last EGD was 2008 by Dr. [name].  I am referring her to [facility] gastroenterology for consideration of esophageal manometry or  other workup to rule out persistent erosive gastritis and esophagitis.  Gastritis With recurrent symptoms of nausea and burning. Her symptoms were well controlled on DEXA Lantus. Her she developed a fixed drug reaction to this which she was not able to tolerate do to rash on legs. Her symptoms not controlled on Nexium and twice daily Zantac. Referral to GI for further evaluation.  Fatigue Etiology unclear. B12 and folate levels are normal. She is not anemic. Her heart rate is well controlled. She has normal liver and renal function.  Her thyroid function is normal on current dose of Synthroid. She has no signs of sleep apnea by exam, her body habitus. We'll consider referring her for sleep study anyway as I have no other plausible etiologies other than depression and medication side effect of metoprolol.   Updated Medication List Outpatient Encounter Prescriptions as of 11/24/2011  Medication Sig Dispense Refill  . esomeprazole (NEXIUM) 40 MG capsule Take 1 capsule (40 mg total) by mouth daily.  30 capsule  3  . fluticasone (CUTIVATE) 0.05 % cream Apply 1 application topically 3 (three) times daily.      Marland Kitchen levothyroxine (SYNTHROID, LEVOTHROID) 50 MCG tablet TAKE ONE TABLET BY MOUTH DAILY  30 tablet  3  . metoprolol (LOPRESSOR) 100 MG tablet TAKE 1 TABLET BY MOUTH TWICE DAILY  60 tablet  2  . ranitidine (ZANTAC) 150 MG tablet Take 1 tablet (150 mg total) by mouth 2 (two) times daily.  90 tablet  3  . Simethicone (GAS-X PO) Take by mouth as needed       . Tetrahydrozoline HCl (EYE DROPS OP) Always eye drops- one drop two times daily in both eyes      . warfarin (COUMADIN) 5 MG tablet Take 1 tablet (5 mg total) by mouth daily.  30 tablet  3  . DISCONTD: warfarin  (COUMADIN) 6 MG tablet Take 1 tablet (6 mg total) by mouth daily.  30 tablet  3  . cyanocobalamin ((VITAMIN B-12)) injection 1,000 mcg          Orders Placed This Encounter  Procedures  . Vitamin B12  . Folate RBC  . CBC with Differential  . INR/PT  . HM PAP SMEAR  . Ambulatory referral to Gastroenterology    No Follow-up on file.

## 2011-11-24 NOTE — Patient Instructions (Addendum)
I am referring you to [facility] specialists to see if they find help your chronic nausea and abdominal pain   We are checking your b12 and protime today and will call you with the results  You received a B12 injection today

## 2011-11-25 ENCOUNTER — Other Ambulatory Visit: Payer: Medicare Other

## 2011-11-25 ENCOUNTER — Encounter: Payer: Self-pay | Admitting: Internal Medicine

## 2011-11-25 DIAGNOSIS — K219 Gastro-esophageal reflux disease without esophagitis: Secondary | ICD-10-CM | POA: Insufficient documentation

## 2011-11-25 LAB — FOLATE RBC: RBC Folate: 637 ng/mL (ref >=366)

## 2011-11-25 NOTE — Assessment & Plan Note (Addendum)
Etiology unclear. B12 and folate levels are normal. She is not anemic. Her heart rate is well controlled. She has normal liver and renal function.  Her thyroid function is normal on current dose of Synthroid. She has no signs of sleep apnea by exam, her body habitus. We'll consider referring her for sleep study anyway as I have no other plausible etiologies other than depression and medication side effect of metoprolol.

## 2011-11-25 NOTE — Assessment & Plan Note (Signed)
Unclear whether her chronic recurrent symptoms are due to her will bowel or to her hiatal hernia with persistent reflux. I cannot seem to get her symptoms under control with PPI therapy given her recent drug reaction to  Dexilant.  Her last EGD was 2008 by Dr. [name].  I am referring her to [facility] gastroenterology for consideration of esophageal manometry or  other workup to rule out persistent erosive gastritis and esophagitis.

## 2011-11-25 NOTE — Assessment & Plan Note (Signed)
With recurrent symptoms of nausea and burning. Her symptoms were well controlled on DEXA Lantus. Her she developed a fixed drug reaction to this which she was not able to tolerate do to rash on legs. Her symptoms not controlled on Nexium and twice daily Zantac. Referral to GI for further evaluation.

## 2011-12-15 DIAGNOSIS — R110 Nausea: Secondary | ICD-10-CM | POA: Diagnosis not present

## 2011-12-15 DIAGNOSIS — K219 Gastro-esophageal reflux disease without esophagitis: Secondary | ICD-10-CM | POA: Diagnosis not present

## 2011-12-15 DIAGNOSIS — Z86010 Personal history of colonic polyps: Secondary | ICD-10-CM | POA: Diagnosis not present

## 2011-12-23 ENCOUNTER — Other Ambulatory Visit (INDEPENDENT_AMBULATORY_CARE_PROVIDER_SITE_OTHER): Payer: Medicare Other | Admitting: *Deleted

## 2011-12-23 DIAGNOSIS — Z7901 Long term (current) use of anticoagulants: Secondary | ICD-10-CM

## 2011-12-23 LAB — PROTIME-INR
INR: 2.1 ratio — ABNORMAL HIGH (ref 0.8–1.0)
Prothrombin Time: 22.8 s — ABNORMAL HIGH (ref 10.2–12.4)

## 2012-01-11 ENCOUNTER — Ambulatory Visit: Payer: Medicare Other | Admitting: Internal Medicine

## 2012-01-12 ENCOUNTER — Ambulatory Visit: Payer: Self-pay | Admitting: Gastroenterology

## 2012-01-12 DIAGNOSIS — K648 Other hemorrhoids: Secondary | ICD-10-CM | POA: Diagnosis not present

## 2012-01-12 DIAGNOSIS — Z860100 Personal history of colon polyps, unspecified: Secondary | ICD-10-CM | POA: Diagnosis not present

## 2012-01-12 DIAGNOSIS — K219 Gastro-esophageal reflux disease without esophagitis: Secondary | ICD-10-CM | POA: Diagnosis not present

## 2012-01-12 DIAGNOSIS — Z79899 Other long term (current) drug therapy: Secondary | ICD-10-CM | POA: Diagnosis not present

## 2012-01-12 DIAGNOSIS — R011 Cardiac murmur, unspecified: Secondary | ICD-10-CM | POA: Diagnosis not present

## 2012-01-12 DIAGNOSIS — R1110 Vomiting, unspecified: Secondary | ICD-10-CM | POA: Diagnosis not present

## 2012-01-12 DIAGNOSIS — Z888 Allergy status to other drugs, medicaments and biological substances status: Secondary | ICD-10-CM | POA: Diagnosis not present

## 2012-01-12 DIAGNOSIS — Z09 Encounter for follow-up examination after completed treatment for conditions other than malignant neoplasm: Secondary | ICD-10-CM | POA: Diagnosis not present

## 2012-01-12 DIAGNOSIS — R12 Heartburn: Secondary | ICD-10-CM | POA: Diagnosis not present

## 2012-01-12 DIAGNOSIS — D126 Benign neoplasm of colon, unspecified: Secondary | ICD-10-CM | POA: Diagnosis not present

## 2012-01-12 DIAGNOSIS — Z7901 Long term (current) use of anticoagulants: Secondary | ICD-10-CM | POA: Diagnosis not present

## 2012-01-12 DIAGNOSIS — I4891 Unspecified atrial fibrillation: Secondary | ICD-10-CM | POA: Diagnosis not present

## 2012-01-12 DIAGNOSIS — I10 Essential (primary) hypertension: Secondary | ICD-10-CM | POA: Diagnosis not present

## 2012-01-12 DIAGNOSIS — E079 Disorder of thyroid, unspecified: Secondary | ICD-10-CM | POA: Diagnosis not present

## 2012-01-13 LAB — HM COLONOSCOPY

## 2012-01-14 ENCOUNTER — Encounter: Payer: Self-pay | Admitting: Internal Medicine

## 2012-01-14 ENCOUNTER — Ambulatory Visit (INDEPENDENT_AMBULATORY_CARE_PROVIDER_SITE_OTHER): Payer: Medicare Other | Admitting: Internal Medicine

## 2012-01-14 VITALS — BP 140/72 | HR 88 | Temp 97.9°F | Resp 16 | Ht 63.0 in | Wt 149.5 lb

## 2012-01-14 DIAGNOSIS — J302 Other seasonal allergic rhinitis: Secondary | ICD-10-CM

## 2012-01-14 DIAGNOSIS — K2970 Gastritis, unspecified, without bleeding: Secondary | ICD-10-CM

## 2012-01-14 DIAGNOSIS — J309 Allergic rhinitis, unspecified: Secondary | ICD-10-CM | POA: Diagnosis not present

## 2012-01-14 DIAGNOSIS — Z23 Encounter for immunization: Secondary | ICD-10-CM

## 2012-01-14 DIAGNOSIS — K2990 Gastroduodenitis, unspecified, without bleeding: Secondary | ICD-10-CM | POA: Diagnosis not present

## 2012-01-14 MED ORDER — CLORAZEPATE DIPOTASSIUM 3.75 MG PO TABS: 3.7500 mg | ORAL_TABLET | Freq: Two times a day (BID) | ORAL | Status: DC | PRN

## 2012-01-14 MED ORDER — LORATADINE 10 MG PO TABS: 10.0000 mg | ORAL_TABLET | Freq: Every day | ORAL | Status: DC

## 2012-01-14 NOTE — Patient Instructions (Addendum)
It is ok to add zantac twice daily in addition to nexium if it helps your sour stomach.    Claritin 10 mg once daily for allergies,    We are adding tranxene , a medicine for your nerves,   Twice daily as needed.  You can start with 1/2 tablet   Ok to take the sublingual B12 dose daily  Return on or around oct 5th for your next coumadin check

## 2012-01-14 NOTE — Progress Notes (Signed)
[name], female   DOB: Dec 13, 1929, 78 y.o.   MRN: [id number]  Patient Active Problem List  Diagnosis  . Controlled atrial fibrillation  . Irritable bowel syndrome without diarrhea  . Hypothyroid  . Encounter for long-term (current) use of anticoagulants  . Gastritis  . Hiatal hernia with gastroesophageal reflux  . Seasonal rhinitis    Subjective:  CC:   Chief Complaint  Patient presents with  . Follow-up    3 month    HPI:   Alexandra Martinez a 78 y.o. female who presents for 6 week Followup on chronic gastritis with frequent visits for escalation of symptoms. She underwent a diagnostic endoscopy last week by Dr. if Aniceto Boss which was reportedly normal. A bravo pH probe was placed for evaluation of acid reflux.  She feels poorly. Her seasonal allergies are problematic and she continues to have nausea in the sour stomach. No weight loss, diarrhea, or vomiting.   Past Medical History  Diagnosis Date  . Hypothyroidism   . Osteoporosis   . Recurrent UTI   . Hydronephrosis     uretero- with congenital UPJ obstruction- [name]  . GERD (gastroesophageal reflux disease)   . DDD (degenerative disc disease)   . Atrial fib/flutter, transient     [facility] admission for RVR 03/2009  . Hydronephrosis with ureteropelvic junction obstruction   . Hiatal hernia with gastroesophageal reflux 2008    by EGD,  [name]    Past Surgical History  Procedure Date  . Cataract extraction     left  . Cholecystectomy   . Cervical disc surgery 1980's  . Thyroid lobectomy     partial, right lobe  . Total abdominal hysterectomy w/ bilateral salpingoophorectomy     The following portions of the patient's history were reviewed and updated as appropriate: Allergies, current medications, and problem list.    Review of Systems:   12 Pt  review of systems was negative except those addressed in the HPI,     History   Social History  . Marital Status: Widowed    Spouse Name:  N/A    Number of Children: N/A  . Years of Education: N/A   Occupational History  . Not on file.   Social History Main Topics  . Smoking status: Never Smoker   . Smokeless tobacco: Never Used  . Alcohol Use: No  . Drug Use: No  . Sexually Active: Not on file   Other Topics Concern  . Not on file   Social History Narrative   Lives alone, widowed in 2005.Husband was a smoker.    Objective:  BP 140/72  Pulse 88  Temp 97.9 F (36.6 C) (Oral)  Resp 16  Ht 5\' 3"  (1.6 m)  Wt 149 lb 8 oz (67.813 kg)  BMI 26.48 kg/m2  SpO2 98%  General appearance: alert, cooperative and appears stated age Ears: normal TM's and external ear canals both ears Throat: lips, mucosa, and tongue normal; teeth and gums normal Neck: no adenopathy, no carotid bruit, supple, symmetrical, trachea midline and thyroid not enlarged, symmetric, no tenderness/mass/nodules Back: symmetric, no curvature. ROM normal. No CVA tenderness. Lungs: clear to auscultation bilaterally Heart: regular rate and rhythm, S1, S2 normal, no murmur, click, rub or gallop Abdomen: soft, non-tender; bowel sounds normal; no masses,  no organomegaly Pulses: 2+ and symmetric Skin: Skin color, texture, turgor normal. No rashes or lesions Lymph nodes: Cervical, supraclavicular, and axillary nodes normal.  Assessment and Plan:  Gastritis Persistent symptoms  only well-controlled with excellent, however she developed an allergic reaction to Dexilant. She's currently taking Nexium. I have told her she can add Zantac 150 mg twice daily if needed to Nexium  Seasonal rhinitis Her HEENT exam is normal. Suggested trial of Claritin or Allegra for control of sneezing and congestion.   Updated Medication List Outpatient Encounter Prescriptions as of 01/14/2012  Medication Sig Dispense Refill  . esomeprazole (NEXIUM) 40 MG capsule Take 1 capsule (40 mg total) by mouth daily.  30 capsule  3  . fluticasone (CUTIVATE) 0.05 % cream Apply 1  application topically 3 (three) times daily.      Marland Kitchen levothyroxine (SYNTHROID, LEVOTHROID) 50 MCG tablet TAKE ONE TABLET BY MOUTH DAILY  30 tablet  3  . metoprolol (LOPRESSOR) 100 MG tablet TAKE 1 TABLET BY MOUTH TWICE DAILY  60 tablet  2  . ranitidine (ZANTAC) 150 MG tablet Take 1 tablet (150 mg total) by mouth 2 (two) times daily.  90 tablet  3  . Simethicone (GAS-X PO) Take by mouth as needed       . Tetrahydrozoline HCl (EYE DROPS OP) Always eye drops- one drop two times daily in both eyes      . warfarin (COUMADIN) 5 MG tablet Take 1 tablet (5 mg total) by mouth daily.  30 tablet  3  . clorazepate (TRANXENE-T) 3.75 MG tablet Take 1 tablet (3.75 mg total) by mouth 2 (two) times daily as needed for anxiety.  60 tablet  3  . loratadine (CLARITIN) 10 MG tablet Take 1 tablet (10 mg total) by mouth daily.  30 tablet  11     Orders Placed This Encounter  Procedures  . Flu vaccine greater than or equal to 3yo preservative free IM    Return in about 3 months (around 04/14/2012).

## 2012-01-16 ENCOUNTER — Encounter: Payer: Self-pay | Admitting: Internal Medicine

## 2012-01-16 DIAGNOSIS — J302 Other seasonal allergic rhinitis: Secondary | ICD-10-CM | POA: Insufficient documentation

## 2012-01-16 NOTE — Assessment & Plan Note (Signed)
Her HEENT exam is normal. Suggested trial of Claritin or Allegra for control of sneezing and congestion.

## 2012-01-16 NOTE — Assessment & Plan Note (Signed)
Persistent symptoms only well-controlled with excellent, however she developed an allergic reaction to Dexilant. She's currently taking Nexium. I have told her she can add Zantac 150 mg twice daily if needed to Nexium

## 2012-01-17 ENCOUNTER — Telehealth: Payer: Self-pay | Admitting: Internal Medicine

## 2012-01-17 NOTE — Telephone Encounter (Signed)
Clorazepate 3.75 mg tablets Generic for tranxene t3.75 mg tablet Take 1 tablet by mouth twice daily as needed for anxiety

## 2012-01-19 NOTE — Telephone Encounter (Signed)
Written script given to patient on 01/14/12. Refill not needed.

## 2012-01-21 ENCOUNTER — Telehealth: Payer: Self-pay

## 2012-01-21 MED ORDER — DIAZEPAM 2 MG PO TABS: 5.0000 mg | ORAL_TABLET | Freq: Two times a day (BID) | ORAL | Status: DC | PRN

## 2012-01-21 NOTE — Telephone Encounter (Signed)
rx printed for valium generic 2.5 mg every 12 hors prn. i have not seen the prior authorization either

## 2012-01-21 NOTE — Telephone Encounter (Signed)
Pharmacist called about patient Rx Tranxene T 3.75 mg. Per pharmacist the insurance does not authorize this medication, she wants to know what you will change it to or contact patient insurance. she stated that she faxed a refill authorization here twice but I didn't see the form.

## 2012-01-21 NOTE — Telephone Encounter (Signed)
Rx faxed to [facility] [phone]

## 2012-01-24 ENCOUNTER — Other Ambulatory Visit (INDEPENDENT_AMBULATORY_CARE_PROVIDER_SITE_OTHER): Payer: Medicare Other

## 2012-01-24 DIAGNOSIS — Z7901 Long term (current) use of anticoagulants: Secondary | ICD-10-CM

## 2012-01-24 LAB — PROTIME-INR
INR: 1.7 ratio — ABNORMAL HIGH (ref 0.8–1.0)
Prothrombin Time: 18.2 s — ABNORMAL HIGH (ref 10.2–12.4)

## 2012-01-28 ENCOUNTER — Other Ambulatory Visit: Payer: Self-pay

## 2012-01-28 MED ORDER — WARFARIN SODIUM 1 MG PO TABS: 1.0000 mg | ORAL_TABLET | ORAL | Status: DC

## 2012-01-28 NOTE — Telephone Encounter (Signed)
plesae call in a 1 mg tablet #30 and have her take 6 mg on Mon Wed and Fridays . Repeat PT/INR in 2 weeks

## 2012-02-05 ENCOUNTER — Other Ambulatory Visit: Payer: Self-pay | Admitting: Internal Medicine

## 2012-02-14 ENCOUNTER — Ambulatory Visit (INDEPENDENT_AMBULATORY_CARE_PROVIDER_SITE_OTHER): Payer: Medicare Other | Admitting: *Deleted

## 2012-02-14 DIAGNOSIS — Z7901 Long term (current) use of anticoagulants: Secondary | ICD-10-CM | POA: Diagnosis not present

## 2012-02-14 LAB — PROTIME-INR
INR: 3.2 ratio — ABNORMAL HIGH (ref 0.8–1.0)
Prothrombin Time: 33 s — ABNORMAL HIGH (ref 10.2–12.4)

## 2012-02-15 DIAGNOSIS — K219 Gastro-esophageal reflux disease without esophagitis: Secondary | ICD-10-CM | POA: Diagnosis not present

## 2012-02-18 ENCOUNTER — Telehealth: Payer: Self-pay | Admitting: Internal Medicine

## 2012-02-18 NOTE — Telephone Encounter (Signed)
Pt called checking on her dosage of meds/rbh Please advise  Can leave message on answering machine

## 2012-02-19 ENCOUNTER — Other Ambulatory Visit: Payer: Self-pay | Admitting: Internal Medicine

## 2012-02-21 ENCOUNTER — Telehealth: Payer: Self-pay | Admitting: *Deleted

## 2012-02-21 NOTE — Progress Notes (Signed)
Patient stop by office and I gave her the results.

## 2012-02-22 NOTE — Telephone Encounter (Signed)
Left message for patient to call me back regarding her question on medication.

## 2012-02-22 NOTE — Telephone Encounter (Signed)
Pt was calling Suanne Marker back about her medications

## 2012-02-24 ENCOUNTER — Other Ambulatory Visit: Payer: Self-pay

## 2012-02-24 ENCOUNTER — Other Ambulatory Visit: Payer: Medicare Other

## 2012-02-28 ENCOUNTER — Other Ambulatory Visit: Payer: Self-pay | Admitting: Internal Medicine

## 2012-02-28 NOTE — Telephone Encounter (Signed)
Spoke to patient to clarify medication dosage.

## 2012-03-02 ENCOUNTER — Other Ambulatory Visit: Payer: Self-pay | Admitting: Internal Medicine

## 2012-03-03 ENCOUNTER — Other Ambulatory Visit: Payer: Self-pay

## 2012-03-03 DIAGNOSIS — K219 Gastro-esophageal reflux disease without esophagitis: Secondary | ICD-10-CM

## 2012-03-03 MED ORDER — ESOMEPRAZOLE MAGNESIUM 40 MG PO CPDR: 40.0000 mg | DELAYED_RELEASE_CAPSULE | Freq: Every day | ORAL | Status: DC

## 2012-03-03 NOTE — Telephone Encounter (Signed)
Refill request for Nexium 40 mg #30 3 R sent electronic to [facility].

## 2012-03-07 ENCOUNTER — Encounter: Payer: Self-pay | Admitting: Internal Medicine

## 2012-03-07 ENCOUNTER — Ambulatory Visit (INDEPENDENT_AMBULATORY_CARE_PROVIDER_SITE_OTHER): Payer: Medicare Other | Admitting: Internal Medicine

## 2012-03-07 VITALS — BP 130/72 | HR 83 | Temp 97.4°F | Ht 63.0 in | Wt 152.0 lb

## 2012-03-07 DIAGNOSIS — J309 Allergic rhinitis, unspecified: Secondary | ICD-10-CM

## 2012-03-07 DIAGNOSIS — J302 Other seasonal allergic rhinitis: Secondary | ICD-10-CM

## 2012-03-07 DIAGNOSIS — Z7901 Long term (current) use of anticoagulants: Secondary | ICD-10-CM

## 2012-03-07 LAB — PROTIME-INR
INR: 1.9 ratio — ABNORMAL HIGH (ref 0.8–1.0)
Prothrombin Time: 20.1 s — ABNORMAL HIGH (ref 10.2–12.4)

## 2012-03-07 NOTE — Patient Instructions (Addendum)
You may try switching from loratidine (claritin)  to cetirizine or fexofenadine for allergic rhinitis.  Ceterizine is zyrtec,  10 mg daily  Fexofenadine is allegra,  Max dose is 180 mg daily    Alternative is to Try the nasonex nasal spray instead.  4 squirts daily ( 2 on each side once a day)  Try the Atkins protein bars as a snack instead of candy or sweets.   Ask Dr. [name] about whether you would be a good candidate for Pradaxa instead of coumadin.

## 2012-03-07 NOTE — Progress Notes (Signed)
[name], female   DOB: 1929/08/01, 78 y.o.   MRN: [id number]  Patient Active Problem List  Diagnosis  . Controlled atrial fibrillation  . Irritable bowel syndrome without diarrhea  . Hypothyroid  . Long term (current) use of anticoagulants  . Gastritis  . Hiatal hernia with gastroesophageal reflux  . Seasonal rhinitis    Subjective:  CC:   Chief Complaint  Patient presents with  . Follow-up    HPI:   Alexandra Martinez a 78 y.o. female who presents 2 month follow up on chronic abdominal pain.  Her gastritis has improved on nexium.  Not using  zantac.  .  New onset cough and chest tightness,  Dry cough,  No mucus production.  Accompanied by sneezing, rhinitis, clear drainage and itchy eyes.  Taking daily claritin,  No prior trial of allegra or zyrtec  Followup on chronic gastritis with frequent visits for escalation of symptoms. She underwent a diagnostic endoscopy recenlty by Dr. if Aniceto Boss which was reportedly normal. A bravo pH probe was placed for evaluation of acid reflux.  She feels poorly. Her seasonal allergies are problematic and she continues to have nausea in the sour stomach. No weight loss, diarrhea, or vomiting.  Past Medical History  Diagnosis Date  . Hypothyroidism   . Osteoporosis   . Recurrent UTI   . Hydronephrosis     uretero- with congenital UPJ obstruction- [name]  . GERD (gastroesophageal reflux disease)   . DDD (degenerative disc disease)   . Atrial fib/flutter, transient     [facility] admission for RVR 03/2009  . Hydronephrosis with ureteropelvic junction obstruction   . Hiatal hernia with gastroesophageal reflux 2008    by EGD,  [name]    Past Surgical History  Procedure Date  . Cataract extraction     left  . Cholecystectomy   . Cervical disc surgery 1980's  . Thyroid lobectomy     partial, right lobe  . Total abdominal hysterectomy w/ bilateral salpingoophorectomy          The following portions of the patient's  history were reviewed and updated as appropriate: Allergies, current medications, and problem list.    Review of Systems:  Review of Systems  Constitutional: Positive for malaise/fatigue. Negative for fever, weight loss and diaphoresis.  HENT: Negative for congestion and sore throat.   Eyes: Negative for blurred vision and discharge.  Respiratory: Negative for cough and shortness of breath.   Cardiovascular: Negative for chest pain and claudication.  Gastrointestinal: Positive for heartburn and abdominal pain. Negative for constipation and blood in stool.  Genitourinary: Negative for dysuria and hematuria.  Musculoskeletal: Negative for myalgias and back pain.  Skin: Negative for rash.  Neurological: Negative for weakness and headaches.  Psychiatric/Behavioral: Negative for depression. The patient is nervous/anxious.       History   Social History  . Marital Status: Widowed    Spouse Name: N/A    Number of Children: N/A  . Years of Education: N/A   Occupational History  . Not on file.   Social History Main Topics  . Smoking status: Never Smoker   . Smokeless tobacco: Never Used  . Alcohol Use: No  . Drug Use: No  . Sexually Active: Not on file   Other Topics Concern  . Not on file   Social History Narrative   Lives alone, widowed in 2005.Husband was a smoker.    Objective:  BP 130/72  Pulse 83  Temp 97.4 F (  36.3 C) (Oral)  Ht 5\' 3"  (1.6 m)  Wt 152 lb (68.947 kg)  BMI 26.93 kg/m2  SpO2 97%  General appearance: alert, cooperative and appears stated age Ears: normal TM's and external ear canals both ears Throat: lips, mucosa, and tongue normal; teeth and gums normal Neck: no adenopathy, no carotid bruit, supple, symmetrical, trachea midline and thyroid not enlarged, symmetric, no tenderness/mass/nodules Back: symmetric, no curvature. ROM normal. No CVA tenderness. Lungs: clear to auscultation bilaterally Heart: regular rate and rhythm, S1, S2 normal, no  murmur, click, rub or gallop Abdomen: soft, non-tender; bowel sounds normal; no masses,  no organomegaly Pulses: 2+ and symmetric Skin: Skin color, texture, turgor normal. No rashes or lesions Lymph nodes: Cervical, supraclavicular, and axillary nodes normal.  Assessment and Plan:  Seasonal rhinitis Advised to try claritin allegra and zyrtec each for a week.   Long term (current) use of anticoagulants PT/INR is now therapeutic .  Given the frequent need for labs and medication changes,  Suggested discussing with [name] about Pradaxa.   Updated Medication List Outpatient Encounter Prescriptions as of 03/07/2012  Medication Sig Dispense Refill  . diazepam (VALIUM) 2 MG tablet Take 2.5 tablets (5 mg total) by mouth every 12 (twelve) hours as needed for anxiety.  60 tablet  1  . esomeprazole (NEXIUM) 40 MG capsule Take 1 capsule (40 mg total) by mouth daily.  30 capsule  3  . fluticasone (CUTIVATE) 0.05 % cream Apply 1 application topically 3 (three) times daily.      Marland Kitchen levothyroxine (SYNTHROID, LEVOTHROID) 50 MCG tablet TAKE ONE TABLET BY MOUTH DAILY  30 tablet  6  . loratadine (CLARITIN) 10 MG tablet Take 1 tablet (10 mg total) by mouth daily.  30 tablet  11  . metoprolol (LOPRESSOR) 100 MG tablet TAKE 1 TABLET BY MOUTH TWICE DAILY  60 tablet  0  . PEG 3350-KCl-NaBcb-NaCl-NaSulf (PEG-3350/ELECTROLYTES) 236 G SOLR       . ranitidine (ZANTAC) 150 MG tablet Take 1 tablet (150 mg total) by mouth 2 (two) times daily.  90 tablet  3  . Simethicone (GAS-X PO) Take by mouth as needed       . Tetrahydrozoline HCl (EYE DROPS OP) Always eye drops- one drop two times daily in both eyes      . warfarin (COUMADIN) 1 MG tablet Take 1 tablet (1 mg total) by mouth as directed. Take 1mg  along  with the 5 mg tablet on Mon-Wed- and Fri.  30 tablet  0  . warfarin (COUMADIN) 5 MG tablet TAKE 1 TABLET BY MOUTH DAILY  30 tablet  3     Orders Placed This Encounter  Procedures  . Protime-INR    No  Follow-up on file.

## 2012-03-08 NOTE — Assessment & Plan Note (Signed)
Advised to try claritin allegra and zyrtec each for a week.

## 2012-03-08 NOTE — Assessment & Plan Note (Signed)
PT/INR is now therapeutic .  Given the frequent need for labs and medication changes,  Suggested discussing with Alexandra Martinez about Pradaxa.

## 2012-03-22 DIAGNOSIS — H2510 Age-related nuclear cataract, unspecified eye: Secondary | ICD-10-CM | POA: Diagnosis not present

## 2012-03-23 DIAGNOSIS — R1013 Epigastric pain: Secondary | ICD-10-CM | POA: Diagnosis not present

## 2012-03-23 DIAGNOSIS — I059 Rheumatic mitral valve disease, unspecified: Secondary | ICD-10-CM | POA: Diagnosis not present

## 2012-03-23 DIAGNOSIS — I4891 Unspecified atrial fibrillation: Secondary | ICD-10-CM | POA: Diagnosis not present

## 2012-03-23 DIAGNOSIS — I209 Angina pectoris, unspecified: Secondary | ICD-10-CM | POA: Diagnosis not present

## 2012-03-23 DIAGNOSIS — K3189 Other diseases of stomach and duodenum: Secondary | ICD-10-CM | POA: Diagnosis not present

## 2012-03-29 ENCOUNTER — Ambulatory Visit: Payer: Self-pay | Admitting: Ophthalmology

## 2012-03-29 DIAGNOSIS — Z01810 Encounter for preprocedural cardiovascular examination: Secondary | ICD-10-CM | POA: Diagnosis not present

## 2012-03-29 DIAGNOSIS — H2510 Age-related nuclear cataract, unspecified eye: Secondary | ICD-10-CM | POA: Diagnosis not present

## 2012-04-03 ENCOUNTER — Encounter: Payer: Self-pay | Admitting: Internal Medicine

## 2012-04-03 ENCOUNTER — Ambulatory Visit (INDEPENDENT_AMBULATORY_CARE_PROVIDER_SITE_OTHER): Payer: Medicare Other | Admitting: Internal Medicine

## 2012-04-03 ENCOUNTER — Ambulatory Visit: Payer: Medicare Other | Admitting: Internal Medicine

## 2012-04-03 VITALS — BP 142/78 | HR 80 | Temp 97.9°F | Resp 12 | Ht 63.0 in | Wt 153.0 lb

## 2012-04-03 DIAGNOSIS — Z7901 Long term (current) use of anticoagulants: Secondary | ICD-10-CM

## 2012-04-03 DIAGNOSIS — R519 Headache, unspecified: Secondary | ICD-10-CM

## 2012-04-03 DIAGNOSIS — I4891 Unspecified atrial fibrillation: Secondary | ICD-10-CM | POA: Diagnosis not present

## 2012-04-03 DIAGNOSIS — K2970 Gastritis, unspecified, without bleeding: Secondary | ICD-10-CM | POA: Diagnosis not present

## 2012-04-03 DIAGNOSIS — K449 Diaphragmatic hernia without obstruction or gangrene: Secondary | ICD-10-CM

## 2012-04-03 DIAGNOSIS — R51 Headache: Secondary | ICD-10-CM

## 2012-04-03 DIAGNOSIS — K219 Gastro-esophageal reflux disease without esophagitis: Secondary | ICD-10-CM | POA: Diagnosis not present

## 2012-04-03 DIAGNOSIS — K2990 Gastroduodenitis, unspecified, without bleeding: Secondary | ICD-10-CM

## 2012-04-03 MED ORDER — WARFARIN SODIUM 1 MG PO TABS: 1.0000 mg | ORAL_TABLET | ORAL | Status: DC

## 2012-04-03 MED ORDER — FAMOTIDINE 20 MG PO TABS: 20.0000 mg | ORAL_TABLET | Freq: Two times a day (BID) | ORAL | Status: DC

## 2012-04-03 NOTE — Progress Notes (Signed)
[name], female   DOB: [birth year], 77 y.o.   MRN: [id number]  Patient Active Problem List  Diagnosis  . Controlled atrial fibrillation  . Irritable bowel syndrome without diarrhea  . Hypothyroid  . Long term (current) use of anticoagulants  . Gastritis  . Hiatal hernia with gastroesophageal reflux  . Seasonal rhinitis  . Headache, chronic daily    Subjective:  CC:   Chief Complaint  Patient presents with  . Follow-up    HPI:   Alexandra Martinez a 78 y.o. female who presents for one month follow up,  She has had a daily morning headache ,  Starts after her morning meds: Synthroid, nexium and bp meds. Has congestion,  Dry cough,  Aggravating despite daily use of nexium. Had a drug reaction to dexilant. Did not improve with use of a nasal spray (nasonex). Occurs several times daily .  Having a hard time at Christmas.  Celebrated  82th birthday.  Scheduled for a stress test by [facility] pre cataract surgery on dec 30th.     Past Medical History  Diagnosis Date  . Hypothyroidism   . Osteoporosis   . Recurrent UTI   . Hydronephrosis     uretero- with congenital UPJ obstruction- [name]  . GERD (gastroesophageal reflux disease)   . DDD (degenerative disc disease)   . Atrial fib/flutter, transient     [facility] admission for RVR 03/2009  . Hydronephrosis with ureteropelvic junction obstruction   . Hiatal hernia with gastroesophageal reflux 2008    by EGD,  [name]    Past Surgical History  Procedure Date  . Cataract extraction     left  . Cholecystectomy   . Cervical disc surgery 1980's  . Thyroid lobectomy     partial, right lobe  . Total abdominal hysterectomy w/ bilateral salpingoophorectomy          The following portions of the patient's history were reviewed and updated as appropriate: Allergies, current medications, and problem list.    Review of Systems:   Patient denies  fevers, malaise, unintentional weight loss, skin rash, eye pain,  sinus congestion and sinus pain, sore throat, dysphagia,  hemoptysis , cough, dyspnea, wheezing, chest pain, palpitations, orthopnea, edema, abdominal pain, nausea, melena, diarrhea, constipation, flank pain, dysuria, hematuria, urinary  Frequency, nocturia, numbness, tingling, seizures,  Focal weakness, Loss of consciousness,  Tremor, insomnia, depression, anxiety, and suicidal ideation.        History   Social History  . Marital Status: Widowed    Spouse Name: N/A    Number of Children: N/A  . Years of Education: N/A   Occupational History  . Not on file.   Social History Main Topics  . Smoking status: Never Smoker   . Smokeless tobacco: Never Used  . Alcohol Use: No  . Drug Use: No  . Sexually Active: Not on file   Other Topics Concern  . Not on file   Social History Narrative   Lives alone, widowed in 2005.Husband was a smoker.    Objective:  BP 142/78  Pulse 80  Temp 97.9 F (36.6 C) (Oral)  Resp 12  Ht 5\' 3"  (1.6 m)  Wt 153 lb (69.4 kg)  BMI 27.10 kg/m2  SpO2 92%  General appearance: alert, cooperative and appears stated age Ears: normal TM's and external ear canals both ears Throat: lips, mucosa, and tongue normal; teeth and gums normal Neck: no adenopathy, no carotid bruit, supple, symmetrical, trachea midline and thyroid  not enlarged, symmetric, no tenderness/mass/nodules Back: symmetric, no curvature. ROM normal. No CVA tenderness. Lungs: clear to auscultation bilaterally Heart: regular rate and rhythm, S1, S2 normal, no murmur, click, rub or gallop Abdomen: soft, non-tender; bowel sounds normal; no masses,  no organomegaly Pulses: 2+ and symmetric Skin: Skin color, texture, turgor normal. No rashes or lesions Lymph nodes: Cervical, supraclavicular, and axillary nodes normal.  Assessment and Plan:  Headache, chronic daily Exam normal.  May be due to metoprolol.  Trial of diltiazem as substitute.   Controlled atrial fibrillation Controlled with  metoprolol,  But having daily headaches.  Trial of diltiazem  Gastritis Managed with daily nexium.  Adding qhs pepcid for persistent cough.   Hiatal hernia with gastroesophageal reflux Managed with daily nexium.  Adding qhs pepcid for persistent cough.    Long term (current) use of anticoagulants Recheck due prior to cataract surgery,  Goal 1.9   Updated Medication List Outpatient Encounter Prescriptions as of 04/03/2012  Medication Sig Dispense Refill  . diazepam (VALIUM) 2 MG tablet Take 2.5 tablets (5 mg total) by mouth every 12 (twelve) hours as needed for anxiety.  60 tablet  1  . esomeprazole (NEXIUM) 40 MG capsule Take 1 capsule (40 mg total) by mouth daily.  30 capsule  3  . fluticasone (CUTIVATE) 0.05 % cream Apply 1 application topically 3 (three) times daily.      Marland Kitchen levothyroxine (SYNTHROID, LEVOTHROID) 50 MCG tablet TAKE ONE TABLET BY MOUTH DAILY  30 tablet  6  . metoprolol (LOPRESSOR) 100 MG tablet TAKE 1 TABLET BY MOUTH TWICE DAILY  60 tablet  0  . PEG 3350-KCl-NaBcb-NaCl-NaSulf (PEG-3350/ELECTROLYTES) 236 G SOLR       . ranitidine (ZANTAC) 150 MG tablet Take 1 tablet (150 mg total) by mouth 2 (two) times daily.  90 tablet  3  . Simethicone (GAS-X PO) Take by mouth as needed       . Tetrahydrozoline HCl (EYE DROPS OP) Always eye drops- one drop two times daily in both eyes      . warfarin (COUMADIN) 1 MG tablet Take 1 tablet (1 mg total) by mouth as directed. Take 1mg  along  with the 5 mg tablet on Wednesdays only.  30 tablet  0  . warfarin (COUMADIN) 5 MG tablet TAKE 1 TABLET BY MOUTH DAILY  30 tablet  3  . [DISCONTINUED] warfarin (COUMADIN) 1 MG tablet Take 1 tablet (1 mg total) by mouth as directed. Take 1mg  along  with the 5 mg tablet on Mon-Wed- and Fri.  30 tablet  0  . famotidine (PEPCID) 20 MG tablet Take 1 tablet (20 mg total) by mouth 2 (two) times daily.  30 tablet  3  . loratadine (CLARITIN) 10 MG tablet Take 1 tablet (10 mg total) by mouth daily.  30 tablet  11      No orders of the defined types were placed in this encounter.    No Follow-up on file.

## 2012-04-03 NOTE — Patient Instructions (Addendum)
See if your diltiazem pills have  expired (look on the bottle)  .  If not,   you can stop the metoprolol after tonight and start the diltiazem in the morning.   Your goal HR is 60 to 90   Come back for a coumadin check on Thursday   Dec 26th .  Continue your current dose  Of 5 mg every day but Wednesday (6 mg on Wednesday)  Use your steroid cream twice daily for a week, then stop.  Continue nexium and synthroid as directed.  Try adding 20 mg of famotidine at bedtime (Pepcid) to see if the cough resolves

## 2012-04-03 NOTE — Assessment & Plan Note (Signed)
Controlled with metoprolol,  But having daily headaches.  Trial of diltiazem

## 2012-04-03 NOTE — Assessment & Plan Note (Signed)
Managed with daily nexium.  Adding qhs pepcid for persistent cough.

## 2012-04-03 NOTE — Assessment & Plan Note (Signed)
Exam normal.  May be due to metoprolol.  Trial of diltiazem as substitute.

## 2012-04-03 NOTE — Assessment & Plan Note (Signed)
Recheck due prior to cataract surgery,  Goal 1.9

## 2012-04-10 ENCOUNTER — Telehealth: Payer: Self-pay

## 2012-04-10 NOTE — Telephone Encounter (Signed)
Alexandra Martinez with [facility] left v/m requesting PT/INR report; advised to call [facility] [phone]; Dr Lupita Dawn pt. Alexandra Martinez voiced understanding.

## 2012-04-13 ENCOUNTER — Other Ambulatory Visit (INDEPENDENT_AMBULATORY_CARE_PROVIDER_SITE_OTHER): Payer: Medicare Other

## 2012-04-13 DIAGNOSIS — Z7901 Long term (current) use of anticoagulants: Secondary | ICD-10-CM | POA: Diagnosis not present

## 2012-04-13 LAB — PROTIME-INR
INR: 2.6 ratio — ABNORMAL HIGH (ref 0.8–1.0)
Prothrombin Time: 26.5 s — ABNORMAL HIGH (ref 10.2–12.4)

## 2012-04-17 ENCOUNTER — Ambulatory Visit: Payer: Self-pay | Admitting: Ophthalmology

## 2012-04-17 DIAGNOSIS — M810 Age-related osteoporosis without current pathological fracture: Secondary | ICD-10-CM | POA: Diagnosis not present

## 2012-04-17 DIAGNOSIS — H269 Unspecified cataract: Secondary | ICD-10-CM | POA: Diagnosis not present

## 2012-04-17 DIAGNOSIS — E079 Disorder of thyroid, unspecified: Secondary | ICD-10-CM | POA: Diagnosis not present

## 2012-04-17 DIAGNOSIS — K449 Diaphragmatic hernia without obstruction or gangrene: Secondary | ICD-10-CM | POA: Diagnosis not present

## 2012-04-17 DIAGNOSIS — Z7902 Long term (current) use of antithrombotics/antiplatelets: Secondary | ICD-10-CM | POA: Diagnosis not present

## 2012-04-17 DIAGNOSIS — I4891 Unspecified atrial fibrillation: Secondary | ICD-10-CM | POA: Diagnosis not present

## 2012-04-17 DIAGNOSIS — R011 Cardiac murmur, unspecified: Secondary | ICD-10-CM | POA: Diagnosis not present

## 2012-04-17 DIAGNOSIS — Z885 Allergy status to narcotic agent status: Secondary | ICD-10-CM | POA: Diagnosis not present

## 2012-04-17 DIAGNOSIS — Z888 Allergy status to other drugs, medicaments and biological substances status: Secondary | ICD-10-CM | POA: Diagnosis not present

## 2012-04-17 DIAGNOSIS — M129 Arthropathy, unspecified: Secondary | ICD-10-CM | POA: Diagnosis not present

## 2012-04-17 DIAGNOSIS — R002 Palpitations: Secondary | ICD-10-CM | POA: Diagnosis not present

## 2012-04-17 DIAGNOSIS — H2510 Age-related nuclear cataract, unspecified eye: Secondary | ICD-10-CM | POA: Diagnosis not present

## 2012-04-17 DIAGNOSIS — I059 Rheumatic mitral valve disease, unspecified: Secondary | ICD-10-CM | POA: Diagnosis not present

## 2012-04-17 DIAGNOSIS — Z79899 Other long term (current) drug therapy: Secondary | ICD-10-CM | POA: Diagnosis not present

## 2012-04-24 DIAGNOSIS — R9430 Abnormal result of cardiovascular function study, unspecified: Secondary | ICD-10-CM | POA: Diagnosis not present

## 2012-04-24 DIAGNOSIS — I209 Angina pectoris, unspecified: Secondary | ICD-10-CM | POA: Diagnosis not present

## 2012-04-24 DIAGNOSIS — E782 Mixed hyperlipidemia: Secondary | ICD-10-CM | POA: Diagnosis not present

## 2012-04-24 DIAGNOSIS — I059 Rheumatic mitral valve disease, unspecified: Secondary | ICD-10-CM | POA: Diagnosis not present

## 2012-04-24 DIAGNOSIS — I5022 Chronic systolic (congestive) heart failure: Secondary | ICD-10-CM | POA: Diagnosis not present

## 2012-04-26 ENCOUNTER — Other Ambulatory Visit: Payer: Self-pay | Admitting: Internal Medicine

## 2012-04-26 DIAGNOSIS — H00029 Hordeolum internum unspecified eye, unspecified eyelid: Secondary | ICD-10-CM | POA: Diagnosis not present

## 2012-04-27 ENCOUNTER — Telehealth: Payer: Self-pay | Admitting: Internal Medicine

## 2012-04-27 NOTE — Telephone Encounter (Signed)
Error

## 2012-04-27 NOTE — Telephone Encounter (Signed)
Med filled.  

## 2012-05-11 ENCOUNTER — Other Ambulatory Visit: Payer: Self-pay | Admitting: *Deleted

## 2012-05-11 DIAGNOSIS — Z7901 Long term (current) use of anticoagulants: Secondary | ICD-10-CM

## 2012-05-15 ENCOUNTER — Other Ambulatory Visit (INDEPENDENT_AMBULATORY_CARE_PROVIDER_SITE_OTHER): Payer: Medicare Other

## 2012-05-15 DIAGNOSIS — Z7901 Long term (current) use of anticoagulants: Secondary | ICD-10-CM

## 2012-05-15 LAB — PROTIME-INR
INR: 2.3 ratio — ABNORMAL HIGH (ref 0.8–1.0)
Prothrombin Time: 23.9 s — ABNORMAL HIGH (ref 10.2–12.4)

## 2012-05-16 NOTE — Progress Notes (Signed)
  Copied documentation from PT/INR result from 02/14/12  Result Notes     Notes Recorded by Crecencio Mc, MD on 02/20/2012 at 6:53 PM Change coumadin dose to 5 mg daily except on Wednesdays. Take 6 mg on Wednesdays. Repeat PT/INR in 2 weeks ------  Notes Recorded by Bo Mcclintock, CMA on 02/15/2012 at 5:07 PM Gave patient results as directed, she is currently taking 5 mg and she was told by you to take 6 mg on Mon-Wed and Fri ------  Notes Recorded by Crecencio Mc, MD on 02/14/2012 at 10:18 PM Coumadin level is highn. Please suspend dose for a day, and confirm current regimen so I can adjust

## 2012-05-25 DIAGNOSIS — Z7901 Long term (current) use of anticoagulants: Secondary | ICD-10-CM | POA: Diagnosis not present

## 2012-05-25 DIAGNOSIS — I059 Rheumatic mitral valve disease, unspecified: Secondary | ICD-10-CM | POA: Diagnosis not present

## 2012-05-25 DIAGNOSIS — I10 Essential (primary) hypertension: Secondary | ICD-10-CM | POA: Diagnosis not present

## 2012-05-25 DIAGNOSIS — I4891 Unspecified atrial fibrillation: Secondary | ICD-10-CM | POA: Diagnosis not present

## 2012-05-29 ENCOUNTER — Encounter: Payer: Self-pay | Admitting: Internal Medicine

## 2012-05-30 ENCOUNTER — Other Ambulatory Visit: Payer: Self-pay | Admitting: Internal Medicine

## 2012-05-31 NOTE — Telephone Encounter (Signed)
Med filled.  

## 2012-06-07 ENCOUNTER — Encounter: Payer: Self-pay | Admitting: Internal Medicine

## 2012-06-07 ENCOUNTER — Ambulatory Visit (INDEPENDENT_AMBULATORY_CARE_PROVIDER_SITE_OTHER): Payer: Medicare Other | Admitting: Internal Medicine

## 2012-06-07 VITALS — BP 128/68 | HR 78 | Temp 97.9°F | Resp 15 | Wt 156.0 lb

## 2012-06-07 DIAGNOSIS — J302 Other seasonal allergic rhinitis: Secondary | ICD-10-CM

## 2012-06-07 DIAGNOSIS — R110 Nausea: Secondary | ICD-10-CM

## 2012-06-07 DIAGNOSIS — K449 Diaphragmatic hernia without obstruction or gangrene: Secondary | ICD-10-CM

## 2012-06-07 DIAGNOSIS — Z1239 Encounter for other screening for malignant neoplasm of breast: Secondary | ICD-10-CM | POA: Diagnosis not present

## 2012-06-07 DIAGNOSIS — K219 Gastro-esophageal reflux disease without esophagitis: Secondary | ICD-10-CM

## 2012-06-07 DIAGNOSIS — Z7901 Long term (current) use of anticoagulants: Secondary | ICD-10-CM | POA: Diagnosis not present

## 2012-06-07 DIAGNOSIS — J309 Allergic rhinitis, unspecified: Secondary | ICD-10-CM | POA: Diagnosis not present

## 2012-06-07 DIAGNOSIS — K589 Irritable bowel syndrome without diarrhea: Secondary | ICD-10-CM

## 2012-06-07 LAB — H. PYLORI ANTIBODY, IGG: H Pylori IgG: NEGATIVE

## 2012-06-07 LAB — HEPATIC FUNCTION PANEL
ALT: 12 U/L (ref 0–35)
AST: 20 U/L (ref 0–37)
Albumin: 3.4 g/dL — ABNORMAL LOW (ref 3.5–5.2)
Alkaline Phosphatase: 63 U/L (ref 39–117)
Bilirubin, Direct: 0.1 mg/dL (ref 0.0–0.3)
Total Bilirubin: 0.4 mg/dL (ref 0.3–1.2)
Total Protein: 6.5 g/dL (ref 6.0–8.3)

## 2012-06-07 LAB — PROTIME-INR
INR: 2.5 ratio — ABNORMAL HIGH (ref 0.8–1.0)
Prothrombin Time: 26 s — ABNORMAL HIGH (ref 10.2–12.4)

## 2012-06-07 MED ORDER — IPRATROPIUM BROMIDE 0.06 % NA SOLN: 2.0000 | Freq: Four times a day (QID) | NASAL | Status: DC

## 2012-06-07 NOTE — Patient Instructions (Addendum)
You can try the new nasal spray to dry up your nose, and continue the loratidine for allergies   You can stop the nexium for 3 days to see if your nausea resolves. If ti dose not,    Change the lisinopril to bedtime instead of with dinner.

## 2012-06-07 NOTE — Progress Notes (Signed)
[name], female   DOB: 1929-06-06, 79 y.o.   MRN: [id number]  Patient Active Problem List  Diagnosis  . Controlled atrial fibrillation  . Irritable bowel syndrome without diarrhea  . Hypothyroid  . Long term (current) use of anticoagulants  . Gastritis  . Hiatal hernia with gastroesophageal reflux  . Seasonal rhinitis    Subjective:  CC:   Chief Complaint  Patient presents with  . Follow-up    3 months    HPI:   Alexandra Martinez a 79 y.o. female who presents for one month follow up on chronic conditions including persistent nausea and malaise,  persistent nasal drainage, and atrial fibrillation.  There has been no significant change in her symptoms.  All previous attempts to modify her reports of nausea and gastritis have either failed due to medication intolerance or medication failure.  Her weight is stable,  She has no history of anorexia or vomiting.Marland Kitchen  She was seen by Dr. [name] last week and no change to medications was made.    Past Medical History  Diagnosis Date  . Hypothyroidism   . Osteoporosis   . Recurrent UTI   . Hydronephrosis     uretero- with congenital UPJ obstruction- [name]  . GERD (gastroesophageal reflux disease)   . DDD (degenerative disc disease)   . Atrial fib/flutter, transient     [facility] admission for RVR 03/2009  . Hydronephrosis with ureteropelvic junction obstruction   . Hiatal hernia with gastroesophageal reflux 2008    by EGD,  [name]    Past Surgical History  Procedure Laterality Date  . Cataract extraction      left  . Cholecystectomy    . Cervical disc surgery  1980's  . Thyroid lobectomy      partial, right lobe  . Total abdominal hysterectomy w/ bilateral salpingoophorectomy         The following portions of the patient's history were reviewed and updated as appropriate: Allergies, current medications, and problem list.    Review of Systems:   Patient denies headache, fevers, unintentional  weight loss, skin rash, eye pain, sinus congestion and sinus pain, sore throat, dysphagia,  hemoptysis , cough, dyspnea, wheezing, chest pain, palpitations, orthopnea, edema, abdominal pain,melena, diarrhea, constipation, flank pain, dysuria, hematuria, urinary  Frequency, nocturia, numbness, tingling, seizures,  Focal weakness, Loss of consciousness,  Tremor, insomnia, depression,  and suicidal ideation.        History   Social History  . Marital Status: Widowed    Spouse Name: N/A    Number of Children: N/A  . Years of Education: N/A   Occupational History  . Not on file.   Social History Main Topics  . Smoking status: Never Smoker   . Smokeless tobacco: Never Used  . Alcohol Use: No  . Drug Use: No  . Sexually Active: Not on file   Other Topics Concern  . Not on file   Social History Narrative   Lives alone, widowed in 2005.   Husband was a smoker.    Objective:  BP 128/68  Pulse 78  Temp(Src) 97.9 F (36.6 C) (Oral)  Resp 15  Wt 156 lb (70.761 kg)  BMI 27.64 kg/m2  SpO2 98%  General appearance: alert, cooperative and appears stated age Ears: normal TM's and external ear canals both ears Throat: lips, mucosa, and tongue normal; teeth and gums normal Neck: no adenopathy, no carotid bruit, supple, symmetrical, trachea midline and thyroid not enlarged, symmetric, no tenderness/mass/nodules  Back: symmetric, no curvature. ROM normal. No CVA tenderness. Lungs: clear to auscultation bilaterally Heart: regular rate and rhythm, S1, S2 normal, no murmur, click, rub or gallop Abdomen: soft, non-tender; bowel sounds normal; no masses,  no organomegaly Pulses: 2+ and symmetric Skin: Skin color, texture, turgor normal. No rashes or lesions Lymph nodes: Cervical, supraclavicular, and axillary nodes normal.  Assessment and Plan:  Seasonal rhinitis She persistent nasal drainage despite trial of all OTC antihistamine.  Trying nasal  steroid.   Irritable bowel syndrome  without diarrhea Persistent symptoms with no appreciable weight loss.  Nothing further to add as all attempts to resolve symptoms have failed.   Hiatal hernia with gastroesophageal reflux Advised to change nexium to qhs given complaints of persistent nausea.   Long term (current) use of anticoagulants Coumadin level is therapeutic,  Continue current regimen and repeat PT/INR in one month./     Updated Medication List Outpatient Encounter Prescriptions as of 06/07/2012  Medication Sig Dispense Refill  . esomeprazole (NEXIUM) 40 MG capsule Take 1 capsule (40 mg total) by mouth daily.  30 capsule  3  . famotidine (PEPCID) 20 MG tablet Take 1 tablet (20 mg total) by mouth 2 (two) times daily.  30 tablet  3  . levothyroxine (SYNTHROID, LEVOTHROID) 50 MCG tablet TAKE ONE TABLET BY MOUTH DAILY  30 tablet  6  . lisinopril (PRINIVIL,ZESTRIL) 5 MG tablet Take 5 mg by mouth daily.      Marland Kitchen loratadine (CLARITIN) 10 MG tablet Take 1 tablet (10 mg total) by mouth daily.  30 tablet  11  . metoprolol (LOPRESSOR) 100 MG tablet TAKE 1 TABLET BY MOUTH TWICE DAILY  60 tablet  0  . ranitidine (ZANTAC) 150 MG tablet Take 1 tablet (150 mg total) by mouth 2 (two) times daily.  90 tablet  3  . Simethicone (GAS-X PO) Take by mouth as needed       . Tetrahydrozoline HCl (EYE DROPS OP) Always eye drops- one drop two times daily in both eyes      . warfarin (COUMADIN) 1 MG tablet       . warfarin (COUMADIN) 5 MG tablet TAKE 1 TABLET BY MOUTH EVERY DAY  30 tablet  0  . [DISCONTINUED] warfarin (COUMADIN) 1 MG tablet TAKE 1 MG TABLET AS DIRECTED--- TAKE 1MG  TABLET ALONG WITH THE 5MG  TABLET ON MONDAYS, WEDNESDAYS AND FRIDAYS  30 tablet  0  . diazepam (VALIUM) 2 MG tablet Take 2.5 tablets (5 mg total) by mouth every 12 (twelve) hours as needed for anxiety.  60 tablet  1  . ipratropium (ATROVENT) 0.06 % nasal spray Place 2 sprays into the nose 4 (four) times daily.  15 mL  12  . [DISCONTINUED] fluticasone (CUTIVATE) 0.05 %  cream Apply 1 application topically 3 (three) times daily.      . [DISCONTINUED] PEG 3350-KCl-NaBcb-NaCl-NaSulf (PEG-3350/ELECTROLYTES) 236 G SOLR       . [DISCONTINUED] warfarin (COUMADIN) 1 MG tablet Take 1 tablet (1 mg total) by mouth as directed. Take 1mg  along  with the 5 mg tablet on Wednesdays only.  30 tablet  0   No facility-administered encounter medications on file as of 06/07/2012.     Orders Placed This Encounter  Procedures  . MM Digital Screening  . Protime-INR  . H. pylori antibody, IgG  . Hepatic function panel    No Follow-up on file.

## 2012-06-10 NOTE — Assessment & Plan Note (Signed)
Persistent symptoms with no appreciable weight loss.  Nothing further to add as all attempts to resolve symptoms have failed.

## 2012-06-10 NOTE — Assessment & Plan Note (Signed)
Advised to change nexium to qhs given complaints of persistent nausea.

## 2012-06-10 NOTE — Assessment & Plan Note (Addendum)
She persistent nasal drainage despite trial of all OTC antihistamine.  Trying nasal  steroid.

## 2012-06-10 NOTE — Assessment & Plan Note (Signed)
Coumadin level is therapeutic,  Continue current regimen and repeat PT/INR in one month 

## 2012-06-14 ENCOUNTER — Other Ambulatory Visit: Payer: Self-pay | Admitting: *Deleted

## 2012-06-15 MED ORDER — WARFARIN SODIUM 5 MG PO TABS: 5.0000 mg | ORAL_TABLET | Freq: Every day | ORAL | Status: DC

## 2012-07-03 ENCOUNTER — Other Ambulatory Visit: Payer: Self-pay | Admitting: Internal Medicine

## 2012-07-03 NOTE — Telephone Encounter (Signed)
Med filled.  

## 2012-07-05 ENCOUNTER — Other Ambulatory Visit: Payer: Self-pay | Admitting: *Deleted

## 2012-07-05 DIAGNOSIS — Z7901 Long term (current) use of anticoagulants: Secondary | ICD-10-CM

## 2012-07-06 ENCOUNTER — Other Ambulatory Visit: Payer: Medicare Other

## 2012-07-07 ENCOUNTER — Other Ambulatory Visit (INDEPENDENT_AMBULATORY_CARE_PROVIDER_SITE_OTHER): Payer: Medicare Other

## 2012-07-07 DIAGNOSIS — Z7901 Long term (current) use of anticoagulants: Secondary | ICD-10-CM

## 2012-07-07 LAB — PROTIME-INR
INR: 1.9 ratio — ABNORMAL HIGH (ref 0.8–1.0)
Prothrombin Time: 20 s — ABNORMAL HIGH (ref 10.2–12.4)

## 2012-07-18 ENCOUNTER — Ambulatory Visit: Payer: Self-pay | Admitting: Internal Medicine

## 2012-07-18 DIAGNOSIS — R922 Inconclusive mammogram: Secondary | ICD-10-CM | POA: Diagnosis not present

## 2012-07-18 DIAGNOSIS — Z1231 Encounter for screening mammogram for malignant neoplasm of breast: Secondary | ICD-10-CM | POA: Diagnosis not present

## 2012-07-18 LAB — HM MAMMOGRAPHY: HM Mammogram: NORMAL

## 2012-08-07 ENCOUNTER — Other Ambulatory Visit (INDEPENDENT_AMBULATORY_CARE_PROVIDER_SITE_OTHER): Payer: Medicare Other

## 2012-08-07 DIAGNOSIS — I4891 Unspecified atrial fibrillation: Secondary | ICD-10-CM

## 2012-08-07 LAB — PROTIME-INR
INR: 2 ratio — ABNORMAL HIGH (ref 0.8–1.0)
Prothrombin Time: 20.9 s — ABNORMAL HIGH (ref 10.2–12.4)

## 2012-08-09 ENCOUNTER — Encounter: Payer: Self-pay | Admitting: Internal Medicine

## 2012-08-10 ENCOUNTER — Ambulatory Visit (INDEPENDENT_AMBULATORY_CARE_PROVIDER_SITE_OTHER): Payer: Medicare Other | Admitting: Internal Medicine

## 2012-08-10 ENCOUNTER — Encounter: Payer: Self-pay | Admitting: Internal Medicine

## 2012-08-10 VITALS — BP 130/76 | HR 97 | Temp 98.0°F | Resp 14 | Wt 153.0 lb

## 2012-08-10 DIAGNOSIS — K2970 Gastritis, unspecified, without bleeding: Secondary | ICD-10-CM

## 2012-08-10 DIAGNOSIS — K2990 Gastroduodenitis, unspecified, without bleeding: Secondary | ICD-10-CM | POA: Diagnosis not present

## 2012-08-10 DIAGNOSIS — J302 Other seasonal allergic rhinitis: Secondary | ICD-10-CM

## 2012-08-10 DIAGNOSIS — R0600 Dyspnea, unspecified: Secondary | ICD-10-CM

## 2012-08-10 DIAGNOSIS — M545 Low back pain, unspecified: Secondary | ICD-10-CM

## 2012-08-10 DIAGNOSIS — J309 Allergic rhinitis, unspecified: Secondary | ICD-10-CM

## 2012-08-10 DIAGNOSIS — R0609 Other forms of dyspnea: Secondary | ICD-10-CM | POA: Diagnosis not present

## 2012-08-10 DIAGNOSIS — R0989 Other specified symptoms and signs involving the circulatory and respiratory systems: Secondary | ICD-10-CM

## 2012-08-10 DIAGNOSIS — Z860100 Personal history of colon polyps, unspecified: Secondary | ICD-10-CM

## 2012-08-10 DIAGNOSIS — N3941 Urge incontinence: Secondary | ICD-10-CM

## 2012-08-10 DIAGNOSIS — G8929 Other chronic pain: Secondary | ICD-10-CM

## 2012-08-10 DIAGNOSIS — K219 Gastro-esophageal reflux disease without esophagitis: Secondary | ICD-10-CM | POA: Diagnosis not present

## 2012-08-10 DIAGNOSIS — Z7901 Long term (current) use of anticoagulants: Secondary | ICD-10-CM | POA: Diagnosis not present

## 2012-08-10 MED ORDER — BUTALBITAL-ACETAMINOPHEN 50-300 MG PO TABS: 1.0000 | ORAL_TABLET | Freq: Four times a day (QID) | ORAL | Status: DC | PRN

## 2012-08-10 MED ORDER — WARFARIN SODIUM 5 MG PO TABS: 5.0000 mg | ORAL_TABLET | Freq: Every day | ORAL | Status: DC

## 2012-08-10 MED ORDER — ESOMEPRAZOLE MAGNESIUM 40 MG PO CPDR: 40.0000 mg | DELAYED_RELEASE_CAPSULE | Freq: Every day | ORAL | Status: DC

## 2012-08-10 NOTE — Patient Instructions (Addendum)
  We are trying bupap which is a medication that has tylenol and butalbital,  For back pain   Please check with the hospital about the wellness programs for senior citizens   I recommend that you start walking daily to increase your stamina and keep you legs strong   Try 25 mg of benadryl (dipenhydramine) at bedtime to help with runny nose. It is sedating for most people   Resume your nexium daily (sent rx to pharmacy)

## 2012-08-10 NOTE — Progress Notes (Signed)
[name], female   DOB: 1929-10-15, 79 y.o.   MRN: [id number]  Patient Active Problem List  Diagnosis  . Controlled atrial fibrillation  . Irritable bowel syndrome without diarrhea  . Hypothyroid  . Long term (current) use of anticoagulants  . Gastritis  . Hiatal hernia with gastroesophageal reflux  . Seasonal rhinitis  . Dyspnea on exertion  . Chronic lower back pain  . Urge urinary incontinence    Subjective:  CC:   Chief Complaint  Patient presents with  . Back Pain  . Shortness of Breath    with exertion    HPI:   Alexandra Martinez a 79 y.o. female who presents Follow up on multiple chronic complaints.  1) She  continues to have gastritis symptoms despite use of proton pump inhibitor daily and a normal endoscopy. She has been seen by GI.  2) she is having low back pain which occasionally radiates to her right hip anteriorly. The back pain is temporarily relieved by use of her TENS unit. However it is aggravated by household duties including mopping floors. 3) she has noticed shortness of breath when working in the yard and washing windows. The dyspnea resolves with rest. There is no associated chest pain but she does report some pressure. Her last episode occurred on Tuesday. She notices that this occurs whenever she rushes around. 4)  Urinary frequency without burning.  He has a history of urge incontinence which is getting worse.      Past Medical History  Diagnosis Date  . Hypothyroidism   . Osteoporosis   . Recurrent UTI   . Hydronephrosis     uretero- with congenital UPJ obstruction- [name]  . GERD (gastroesophageal reflux disease)   . DDD (degenerative disc disease)   . Atrial fib/flutter, transient     [facility] admission for RVR 03/2009  . Hydronephrosis with ureteropelvic junction obstruction   . Hiatal hernia with gastroesophageal reflux 2008    by EGD,  [name]    Past Surgical History  Procedure Laterality Date  . Cataract  extraction      left  . Cholecystectomy    . Cervical disc surgery  1980's  . Thyroid lobectomy      partial, right lobe  . Total abdominal hysterectomy w/ bilateral salpingoophorectomy         The following portions of the patient's history were reviewed and updated as appropriate: Allergies, current medications, and problem list.    Review of Systems:   Patient denies headache, fevers, malaise, unintentional weight loss, skin rash, eye pain, sinus congestion and sinus pain, sore throat, dysphagia,  hemoptysis , cough, dyspnea, wheezing, chest pain, palpitations, orthopnea, edema, abdominal pain, nausea, melena, diarrhea, constipation, flank pain, dysuria, hematuria, urinary  Frequency, nocturia, numbness, tingling, seizures,  Focal weakness, Loss of consciousness,  Tremor, insomnia, depression, anxiety, and suicidal ideation.     History   Social History  . Marital Status: Widowed    Spouse Name: N/A    Number of Children: N/A  . Years of Education: N/A   Occupational History  . Not on file.   Social History Main Topics  . Smoking status: Never Smoker   . Smokeless tobacco: Never Used  . Alcohol Use: No  . Drug Use: No  . Sexually Active: Not on file   Other Topics Concern  . Not on file   Social History Narrative   Lives alone, widowed in 2005.   Husband was a smoker.  Objective:  BP 130/76  Pulse 97  Temp(Src) 98 F (36.7 C) (Oral)  Resp 14  Wt 153 lb (69.4 kg)  BMI 27.11 kg/m2  SpO2 96%  General appearance: alert, cooperative and appears stated age Ears: normal TM's and external ear canals both ears Throat: lips, mucosa, and tongue normal; teeth and gums normal Neck: no adenopathy, no carotid bruit, supple, symmetrical, trachea midline and thyroid not enlarged, symmetric, no tenderness/mass/nodules Back: symmetric, no curvature. ROM normal. No CVA tenderness. Lungs: clear to auscultation bilaterally Heart: regular rate and rhythm, S1, S2 normal,  no murmur, click, rub or gallop Abdomen: soft, non-tender; bowel sounds normal; no masses,  no organomegaly Pulses: 2+ and symmetric Skin: Skin color, texture, turgor normal. No rashes or lesions Lymph nodes: Cervical, supraclavicular, and axillary nodes normal.  Assessment and Plan:  Gastritis Her symptoms recur with another whenever she stops her medications. It is unclear how much of her symptoms are due to irritable bowel syndrome and how much are due to gastritis. She had a completely normal endoscopy by Dr.Iftikhar and a BRAVO capsule was placed for assessment of pH in the esophagus. H. pylori antibody was normal in February. She had the most prolonged relief with Dexilant but unfortunately developed a fixed drug reaction from this medication. Resume Nexium and each bedtime Pepcid.  Dyspnea on exertion Likely multifactorial due to sedentary lifestyle, age of 79 years old, and atrial fibrillation with mildly reduced ejection fraction per notes from Dr. [name]. On talk with patient today about her need for regular exercise and a supervised cardiopulmonary rehabilitation program. I have recommended referral to cardiopulmonary rehabilitation program at the hospital , which she states she cannot afford. or participation in the [employer] sneakers program. Unfortunately her insurance does not cover it so sneakers program. I have recommended that she call hospitalized Center to see what they can offer for her.  Long term (current) use of anticoagulants INR was therapeutic at last check and no changes were made. She continues to have monthly INRs at our office I will continue to manage her Coumadin until we have the Coumadin clinic closely by.  Chronic lower back pain  Patient symptoms and nonradicular. She does have mild degenerative changes by prior CT of the spine done in 2007. Recommended trial of butalbital for pain control. We are avoiding nonsteroidals given her persistent symptoms of  gastritis.  Seasonal rhinitis Her sneezing and itchy eyes are controlled by over-the-counter second-generation histamines which is continually bothered by a running nose. Recommended adding evening Benadryl.  Urge urinary incontinence Discussed using medications to control her incontinence. However given the potential side effects including dry mouth constipation and confusion she is not a good candidate for this medication.  Personal history of colonic polyps Three-year followup colonoscopy was normal except for small internal hemorrhoids in September 2013.  5 year followup is recommended.  A total of 40 minutes, more than half of which was face to face time, was spent in evaluation and treatment of patient, including reviewing records from other prviders and recent laboratory data.   Updated Medication List Outpatient Encounter Prescriptions as of 08/10/2012  Medication Sig Dispense Refill  . diazepam (VALIUM) 2 MG tablet Take 2.5 tablets (5 mg total) by mouth every 12 (twelve) hours as needed for anxiety.  60 tablet  1  . famotidine (PEPCID) 20 MG tablet Take 1 tablet (20 mg total) by mouth 2 (two) times daily.  30 tablet  3  . levothyroxine (SYNTHROID, LEVOTHROID) 50 MCG tablet  TAKE ONE TABLET BY MOUTH DAILY  30 tablet  6  . lisinopril (PRINIVIL,ZESTRIL) 5 MG tablet Take 5 mg by mouth daily.      Marland Kitchen loratadine (CLARITIN) 10 MG tablet Take 1 tablet (10 mg total) by mouth daily.  30 tablet  11  . metoprolol (LOPRESSOR) 100 MG tablet TAKE 1 TABLET BY MOUTH TWICE DAILY  60 tablet  0  . ranitidine (ZANTAC) 150 MG tablet Take 1 tablet (150 mg total) by mouth 2 (two) times daily.  90 tablet  3  . Simethicone (GAS-X PO) Take by mouth as needed       . Tetrahydrozoline HCl (EYE DROPS OP) Always eye drops- one drop two times daily in both eyes      . warfarin (COUMADIN) 1 MG tablet TAKE 1 TABLET BY MOUTH AS DIRECTED ALONG WITH 5 MG ON MONDAY , WEDNESDAY AND FRIDAY  30 tablet  0  . warfarin  (COUMADIN) 5 MG tablet Take 1 tablet (5 mg total) by mouth daily.  30 tablet  11  . [DISCONTINUED] warfarin (COUMADIN) 5 MG tablet Take 1 tablet (5 mg total) by mouth daily.  30 tablet  11  . Butalbital-Acetaminophen 50-300 MG TABS Take 1 tablet by mouth every 6 (six) hours as needed.  30 tablet  1  . esomeprazole (NEXIUM) 40 MG capsule Take 1 capsule (40 mg total) by mouth daily.  30 capsule  3  . [DISCONTINUED] esomeprazole (NEXIUM) 40 MG capsule Take 1 capsule (40 mg total) by mouth daily.  30 capsule  3  . [DISCONTINUED] ipratropium (ATROVENT) 0.06 % nasal spray Place 2 sprays into the nose 4 (four) times daily.  15 mL  12  . [DISCONTINUED] warfarin (COUMADIN) 1 MG tablet        No facility-administered encounter medications on file as of 08/10/2012.

## 2012-08-11 ENCOUNTER — Telehealth: Payer: Self-pay | Admitting: *Deleted

## 2012-08-11 NOTE — Telephone Encounter (Signed)
Called [phone] for prior authorization in the Bupap 50-300 mg tablets, form is being faxed over now

## 2012-08-12 DIAGNOSIS — Z860100 Personal history of colon polyps, unspecified: Secondary | ICD-10-CM | POA: Insufficient documentation

## 2012-08-12 DIAGNOSIS — G8929 Other chronic pain: Secondary | ICD-10-CM | POA: Insufficient documentation

## 2012-08-12 DIAGNOSIS — N3941 Urge incontinence: Secondary | ICD-10-CM | POA: Insufficient documentation

## 2012-08-12 DIAGNOSIS — R0609 Other forms of dyspnea: Secondary | ICD-10-CM | POA: Insufficient documentation

## 2012-08-12 DIAGNOSIS — R0600 Dyspnea, unspecified: Secondary | ICD-10-CM | POA: Insufficient documentation

## 2012-08-12 NOTE — Assessment & Plan Note (Addendum)
Her symptoms recur with another whenever she stops her medications. It is unclear how much of her symptoms are due to irritable bowel syndrome and how much are due to gastritis. She had a completely normal endoscopy by Dr.Iftikhar and a BRAVO capsule was placed for assessment of pH in the esophagus. H. pylori antibody was normal in February. She had the most prolonged relief with Dexilant but unfortunately developed a fixed drug reaction from this medication. Resume Nexium and each bedtime Pepcid.

## 2012-08-12 NOTE — Assessment & Plan Note (Signed)
Three-year followup colonoscopy was normal except for small internal hemorrhoids in September 2013.  5 year followup is recommended.

## 2012-08-12 NOTE — Assessment & Plan Note (Addendum)
Patient symptoms and nonradicular. She does have mild degenerative changes by prior CT of the spine done in 2007. Recommended trial of butalbital for pain control. We are avoiding nonsteroidals given her persistent symptoms of gastritis.

## 2012-08-12 NOTE — Assessment & Plan Note (Signed)
Her sneezing and itchy eyes are controlled by over-the-counter second-generation histamines which is continually bothered by a running nose. Recommended adding evening Benadryl.

## 2012-08-12 NOTE — Assessment & Plan Note (Signed)
Discussed using medications to control her incontinence. However given the potential side effects including dry mouth constipation and confusion she is not a good candidate for this medication.

## 2012-08-12 NOTE — Assessment & Plan Note (Signed)
INR was therapeutic at last check and no changes were made. She continues to have monthly INRs at our office I will continue to manage her Coumadin until we have the Coumadin clinic closely by.

## 2012-08-12 NOTE — Assessment & Plan Note (Signed)
Likely multifactorial due to sedentary lifestyle, age of 79 years old, and atrial fibrillation with mildly reduced ejection fraction per notes from Dr. [name]. On talk with patient today about her need for regular exercise and a supervised cardiopulmonary rehabilitation program. I have recommended referral to cardiopulmonary rehabilitation program at the hospital , which she states she cannot afford. or participation in the [employer] sneakers program. Unfortunately her insurance does not cover it so sneakers program. I have recommended that she call hospitalized Center to see what they can offer for her.

## 2012-08-16 DIAGNOSIS — K3189 Other diseases of stomach and duodenum: Secondary | ICD-10-CM | POA: Diagnosis not present

## 2012-08-16 DIAGNOSIS — K219 Gastro-esophageal reflux disease without esophagitis: Secondary | ICD-10-CM | POA: Diagnosis not present

## 2012-08-16 DIAGNOSIS — R1013 Epigastric pain: Secondary | ICD-10-CM | POA: Diagnosis not present

## 2012-08-21 ENCOUNTER — Ambulatory Visit: Payer: Medicare Other | Admitting: Internal Medicine

## 2012-08-21 ENCOUNTER — Telehealth: Payer: Self-pay | Admitting: *Deleted

## 2012-08-21 NOTE — Telephone Encounter (Signed)
Received a fax from [facility] for Bupap [phone]

## 2012-08-23 ENCOUNTER — Other Ambulatory Visit: Payer: Self-pay | Admitting: Internal Medicine

## 2012-08-23 ENCOUNTER — Telehealth: Payer: Self-pay | Admitting: Internal Medicine

## 2012-08-23 NOTE — Telephone Encounter (Signed)
Rx sent to pharmacy by escript  

## 2012-08-23 NOTE — Telephone Encounter (Signed)
Insurance will not cover the medication I tried to prescribe for her back pain. If she needs something stronger than tylenol and does not tolerate tramadol ,  We can try vicodin if she is willing

## 2012-08-24 MED ORDER — TRAMADOL HCL 50 MG PO TABS: 50.0000 mg | ORAL_TABLET | Freq: Three times a day (TID) | ORAL | Status: DC | PRN

## 2012-08-24 NOTE — Telephone Encounter (Signed)
Patient notified

## 2012-08-24 NOTE — Telephone Encounter (Signed)
Called patient stated she would like to try tramadol for her back pain.

## 2012-08-24 NOTE — Telephone Encounter (Signed)
rx sent for tramadol

## 2012-08-30 ENCOUNTER — Other Ambulatory Visit: Payer: Self-pay | Admitting: *Deleted

## 2012-08-30 MED ORDER — LEVOTHYROXINE SODIUM 50 MCG PO TABS: ORAL_TABLET | ORAL | Status: DC

## 2012-09-07 ENCOUNTER — Other Ambulatory Visit: Payer: Medicare Other

## 2012-09-08 ENCOUNTER — Encounter: Payer: Self-pay | Admitting: Internal Medicine

## 2012-09-08 ENCOUNTER — Ambulatory Visit (INDEPENDENT_AMBULATORY_CARE_PROVIDER_SITE_OTHER): Payer: Medicare Other | Admitting: Internal Medicine

## 2012-09-08 VITALS — BP 128/68 | HR 67 | Temp 97.7°F | Resp 16 | Wt 154.5 lb

## 2012-09-08 DIAGNOSIS — R110 Nausea: Secondary | ICD-10-CM | POA: Diagnosis not present

## 2012-09-08 DIAGNOSIS — E039 Hypothyroidism, unspecified: Secondary | ICD-10-CM | POA: Diagnosis not present

## 2012-09-08 DIAGNOSIS — R5383 Other fatigue: Secondary | ICD-10-CM

## 2012-09-08 DIAGNOSIS — R5381 Other malaise: Secondary | ICD-10-CM | POA: Diagnosis not present

## 2012-09-08 DIAGNOSIS — D649 Anemia, unspecified: Secondary | ICD-10-CM

## 2012-09-08 DIAGNOSIS — Z7901 Long term (current) use of anticoagulants: Secondary | ICD-10-CM | POA: Diagnosis not present

## 2012-09-08 DIAGNOSIS — Z86010 Personal history of colonic polyps: Secondary | ICD-10-CM

## 2012-09-08 LAB — CBC WITH DIFFERENTIAL/PLATELET
Basophils Absolute: 0 10*3/uL (ref 0.0–0.1)
Basophils Relative: 0 % (ref 0–1)
Eosinophils Absolute: 0.2 10*3/uL (ref 0.0–0.7)
Eosinophils Relative: 3 % (ref 0–5)
HCT: 32.8 % — ABNORMAL LOW (ref 36.0–46.0)
Hemoglobin: 10.8 g/dL — ABNORMAL LOW (ref 12.0–15.0)
Lymphocytes Relative: 30 % (ref 12–46)
Lymphs Abs: 1.5 10*3/uL (ref 0.7–4.0)
MCH: 29.3 pg (ref 26.0–34.0)
MCHC: 32.9 g/dL (ref 30.0–36.0)
MCV: 88.9 fL (ref 78.0–100.0)
Monocytes Absolute: 0.5 10*3/uL (ref 0.1–1.0)
Monocytes Relative: 10 % (ref 3–12)
Neutro Abs: 2.9 10*3/uL (ref 1.7–7.7)
Neutrophils Relative %: 57 % (ref 43–77)
Platelets: 176 10*3/uL (ref 150–400)
RBC: 3.69 MIL/uL — ABNORMAL LOW (ref 3.87–5.11)
RDW: 14.2 % (ref 11.5–15.5)
WBC: 5.2 10*3/uL (ref 4.0–10.5)

## 2012-09-08 LAB — IRON AND TIBC
%SAT: 18 % — ABNORMAL LOW (ref 20–55)
Iron: 58 ug/dL (ref 42–145)
TIBC: 316 ug/dL (ref 250–470)
UIBC: 258 ug/dL (ref 125–400)

## 2012-09-08 LAB — COMPLETE METABOLIC PANEL WITH GFR
ALT: 11 U/L (ref 0–35)
AST: 19 U/L (ref 0–37)
Albumin: 3.7 g/dL (ref 3.5–5.2)
Alkaline Phosphatase: 60 U/L (ref 39–117)
BUN: 13 mg/dL (ref 6–23)
CO2: 25 mEq/L (ref 19–32)
Calcium: 8.6 mg/dL (ref 8.4–10.5)
Chloride: 105 mEq/L (ref 96–112)
Creat: 1.27 mg/dL — ABNORMAL HIGH (ref 0.50–1.10)
GFR, Est African American: 46 mL/min — ABNORMAL LOW
GFR, Est Non African American: 40 mL/min — ABNORMAL LOW
Glucose, Bld: 81 mg/dL (ref 70–99)
Potassium: 4.2 mEq/L (ref 3.5–5.3)
Sodium: 140 mEq/L (ref 135–145)
Total Bilirubin: 0.3 mg/dL (ref 0.3–1.2)
Total Protein: 6.1 g/dL (ref 6.0–8.3)

## 2012-09-08 LAB — TSH: TSH: 1.926 u[IU]/mL (ref 0.350–4.500)

## 2012-09-08 LAB — PROTIME-INR
INR: 2.59 — ABNORMAL HIGH (ref <1.50)
Prothrombin Time: 26.7 seconds — ABNORMAL HIGH (ref 11.6–15.2)

## 2012-09-08 LAB — FERRITIN: Ferritin: 43 ng/mL (ref 10–291)

## 2012-09-08 LAB — VITAMIN B12: Vitamin B-12: 377 pg/mL (ref 211–911)

## 2012-09-08 MED ORDER — ONDANSETRON HCL 4 MG PO TABS: 4.0000 mg | ORAL_TABLET | Freq: Three times a day (TID) | ORAL | Status: DC | PRN

## 2012-09-08 NOTE — Progress Notes (Addendum)
[name], female   DOB: [birth year], 79 y.o.   MRN: [id number]   Patient Active Problem List   Diagnosis Date Noted  . Other malaise and fatigue 09/10/2012  . Dyspnea on exertion 08/12/2012  . Chronic lower back pain 08/12/2012  . Urge urinary incontinence 08/12/2012  . Personal history of colonic polyps 08/12/2012  . Seasonal rhinitis 01/16/2012  . Hiatal hernia with gastroesophageal reflux   . Nausea alone 04/16/2011  . Controlled atrial fibrillation 12/15/2010  . Irritable bowel syndrome without diarrhea 12/15/2010  . Hypothyroid 12/15/2010  . Long term (current) use of anticoagulants 12/15/2010    Subjective:  CC:   Chief Complaint  Patient presents with  . Follow-up    1 month    HPI:   Alexandra Martinez a 79 y.o. female who presents Persistent fatigue.  "Im just so tired all the time and I wake up nauseated."  She is relatively inactive because " everything wears me out" including cleaning her house No history of OSA by prior sleep study.   Wakes up sometimes tired,  Sometimes not.  If she wakes up feeling ok,  Then she tires within an hour or two.  She is not skipping breakfast.  She reprots getting short of breath with walking,  But has had no orthopnea or edema.  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 She underwent EGD with  BRAVO p study in September by Dr. Dionne Milo and the EGD was normal .   Past Medical History  Diagnosis Date  . Hypothyroidism   . Osteoporosis   . Recurrent UTI   . Hydronephrosis     uretero- with congenital UPJ obstruction- [name]  . GERD (gastroesophageal reflux disease)   . DDD (degenerative disc disease)   . Atrial fib/flutter, transient     [facility] admission for RVR 03/2009  . Hydronephrosis with ureteropelvic junction obstruction   . Hiatal hernia with gastroesophageal reflux  2008    by EGD,  [name]    Past Surgical History  Procedure Laterality Date  . Cataract extraction      left  . Cholecystectomy    . Cervical disc surgery  1980's  . Thyroid lobectomy      partial, right lobe  . Total abdominal hysterectomy w/ bilateral salpingoophorectomy         The following portions of the patient's history were reviewed and updated as appropriate: Allergies, current medications, and problem list.    Review of Systems:   12 Pt  review of systems was negative except those addressed in the HPI,     History   Social History  . Marital Status: Widowed    Spouse Name: N/A    Number of Children: N/A  . Years of Education: N/A   Occupational History  . Not on file.   Social History Main Topics  . Smoking status: Never Smoker   . Smokeless tobacco: Never Used  . Alcohol Use: No  . Drug Use: No  . Sexually Active: Not on file   Other Topics Concern  . Not on file   Social History Narrative   Lives alone, widowed in 2005.   Husband was a smoker.    Objective:  BP 128/68  Pulse 67  Temp(Src) 97.7 F (36.5 C) (Oral)  Resp 16  Wt 154 lb 8 oz (70.081 kg)  BMI 27.38 kg/m2  SpO2 97%  General appearance: alert, cooperative and appears stated age Ears: normal TM's and external ear canals both ears Throat: lips, mucosa, and tongue normal; teeth and gums normal Neck: no adenopathy, no carotid bruit, supple, symmetrical, trachea midline and thyroid not enlarged, symmetric, no tenderness/mass/nodules Back: symmetric, no curvature. ROM normal. No CVA tenderness. Lungs: clear to auscultation bilaterally Heart: regular rate and rhythm, S1, S2 normal, no murmur, click, rub or gallop Abdomen: soft, non-tender; bowel sounds normal; no masses,  no organomegaly Pulses: 2+ and symmetric Skin: Skin color, texture, turgor normal. No rashes or lesions Lymph nodes: Cervical, supraclavicular, and axillary nodes normal.  Assessment and  Plan:   Personal history of colonic polyps Repeat colonoscopy was normal sept 2013.  Follow up in 5 years if patient is willing and able.   Long term (current) use of anticoagulants Coumadin level is therapeutic,  Continue current regimen and repeat PT/INR in one month./    Nausea alone Persistent despite multiple workups in the past at [facility] and now by Dr. Nell Range with no signs of gastritis or duodenitis on EGD Sept 2013.  Will recommend stopping her PPI daily therapy for a full month and using zofran as needed for nausea.   Other malaise and fatigue Appears to be multifactorial from persistent nausea low back pain/arthritis, cardiomyopathy, and mild anemiawiht decreased GFR  noted on today's labs.  Will return for MM rule out and FOBT.  Screened for depression and does not endorse hopelessness or isolation/anhedonia, . Referral to cardiopulmonary rehab, offered ,  She is willing to do if insurance will cover it.    Updated Medication List Outpatient Encounter Prescriptions as of 09/08/2012  Medication Sig Dispense Refill  . esomeprazole (NEXIUM) 40 MG capsule Take 1 capsule (40 mg total) by mouth daily.  30 capsule  3  . famotidine (PEPCID) 20 MG tablet Take 1 tablet (20 mg total) by mouth 2 (two) times daily.  30 tablet  3  . levothyroxine (SYNTHROID, LEVOTHROID) 50 MCG tablet TAKE ONE TABLET BY MOUTH DAILY  30 tablet  1  . lisinopril (PRINIVIL,ZESTRIL) 5 MG tablet Take 5 mg by mouth daily.      Marland Kitchen loratadine (CLARITIN) 10 MG tablet Take 1 tablet (10 mg total) by mouth daily.  30 tablet  11  . metoprolol (LOPRESSOR) 100 MG tablet TAKE 1 TABLET BY MOUTH TWICE DAILY  180 tablet  1  . ranitidine (ZANTAC) 150 MG tablet Take 1 tablet (150 mg total) by mouth 2 (two) times daily.  90 tablet  3  . Simethicone (GAS-X PO) Take by mouth as needed       . traMADol (ULTRAM) 50 MG tablet Take 1 tablet (50 mg total) by mouth every 8 (eight) hours as needed for pain.  90 tablet  0  . warfarin  (COUMADIN) 1 MG tablet TAKE 1 TABLET BY MOUTH AS DIRECTED ALONG WITH 5 MG ON MONDAY , WEDNESDAY AND FRIDAY  30 tablet  0  . warfarin (COUMADIN) 5 MG tablet Take 1 tablet (5 mg total) by mouth daily.  30 tablet  11  . diazepam (VALIUM) 2 MG tablet Take 2.5 tablets (5 mg total) by mouth every 12 (twelve) hours as needed for anxiety.  60 tablet  1  . ondansetron (ZOFRAN) 4 MG tablet Take 1 tablet (4 mg total) by mouth every 8 (eight) hours as needed for nausea.  20 tablet  0  . Tetrahydrozoline HCl (EYE DROPS OP) Always eye  drops- one drop two times daily in both eyes       No facility-administered encounter medications on file as of 09/08/2012.

## 2012-09-08 NOTE — Patient Instructions (Addendum)
Try taking the lisinopril with your metoprolol to see if it changes your morning nausea  I am prescribing zofran for your morning nausea . If it is too $$$$ .,  Le tme know and we will try the sedating medication that is called" at bedtime    You can take the B12 liquid ,  It will not interfere with any of your medications

## 2012-09-10 ENCOUNTER — Encounter: Payer: Self-pay | Admitting: Internal Medicine

## 2012-09-10 DIAGNOSIS — D62 Acute posthemorrhagic anemia: Secondary | ICD-10-CM | POA: Insufficient documentation

## 2012-09-10 DIAGNOSIS — R5381 Other malaise: Secondary | ICD-10-CM | POA: Insufficient documentation

## 2012-09-10 DIAGNOSIS — R5383 Other fatigue: Secondary | ICD-10-CM | POA: Insufficient documentation

## 2012-09-10 NOTE — Assessment & Plan Note (Signed)
Repeat colonoscopy as normal sept 2013.  Follow up in 5 years if patient is willing and able.

## 2012-09-10 NOTE — Assessment & Plan Note (Signed)
Coumadin level is therapeutic,  Continue current regimen and repeat PT/INR in one month 

## 2012-09-10 NOTE — Assessment & Plan Note (Signed)
Persistent despite multiple workups in the past at [facility] and now by Dr. Nell Range with no signs of gastritis or duodenitis on EGD Sept 2013.  Will recommend stopping her PPI daily therapy for a full month and using zofran as needed for nausea.

## 2012-09-10 NOTE — Addendum Note (Signed)
Addended by: Crecencio Mc on: 09/10/2012 04:01 PM   Modules accepted: Orders

## 2012-09-10 NOTE — Assessment & Plan Note (Addendum)
Appears to be multifactorial from persistent nausea low back pain/arthritis, cardiomyopathy, and mild anemiawiht decreased GFR  noted on today's labs.  Will return for MM rule out and FOBT.  Screened for depression and does not endorse hopelessness or isolation/anhedonia, . Referral to cardiopulmonary rehab, offered ,  She is willing to do if insurance will cover it.

## 2012-09-14 ENCOUNTER — Telehealth: Payer: Self-pay | Admitting: *Deleted

## 2012-09-14 ENCOUNTER — Other Ambulatory Visit (INDEPENDENT_AMBULATORY_CARE_PROVIDER_SITE_OTHER): Payer: Medicare Other

## 2012-09-14 DIAGNOSIS — D649 Anemia, unspecified: Secondary | ICD-10-CM

## 2012-09-14 LAB — BASIC METABOLIC PANEL
BUN: 12 mg/dL (ref 6–23)
CO2: 28 mEq/L (ref 19–32)
Calcium: 8.7 mg/dL (ref 8.4–10.5)
Chloride: 105 mEq/L (ref 96–112)
Creatinine, Ser: 1.1 mg/dL (ref 0.4–1.2)
GFR: 52.96 mL/min — ABNORMAL LOW (ref >60.00)
Glucose, Bld: 93 mg/dL (ref 70–99)
Potassium: 4.5 mEq/L (ref 3.5–5.1)
Sodium: 138 mEq/L (ref 135–145)

## 2012-09-14 NOTE — Telephone Encounter (Signed)
No, thanks it was just done and therapeutic

## 2012-09-14 NOTE — Telephone Encounter (Signed)
Did you want the Pt/inr done to day as well with the other labs ?

## 2012-09-15 LAB — ERYTHROPOIETIN: Erythropoietin: 13 m[IU]/mL (ref 2.6–18.5)

## 2012-09-18 ENCOUNTER — Ambulatory Visit (INDEPENDENT_AMBULATORY_CARE_PROVIDER_SITE_OTHER): Payer: Medicare Other | Admitting: Adult Health

## 2012-09-18 ENCOUNTER — Encounter: Payer: Self-pay | Admitting: Adult Health

## 2012-09-18 VITALS — BP 106/68 | HR 95 | Temp 98.0°F | Resp 12 | Wt 148.5 lb

## 2012-09-18 DIAGNOSIS — Z79899 Other long term (current) drug therapy: Secondary | ICD-10-CM | POA: Diagnosis not present

## 2012-09-18 DIAGNOSIS — J329 Chronic sinusitis, unspecified: Secondary | ICD-10-CM

## 2012-09-18 LAB — UIFE/LIGHT CHAINS/TP QN, 24-HR UR
Albumin, U: DETECTED
Alpha 1, Urine: DETECTED — AB
Alpha 2, Urine: DETECTED — AB
Beta, Urine: DETECTED — AB
Free Kappa Lt Chains,Ur: 0.54 mg/dL (ref 0.14–2.42)
Free Kappa/Lambda Ratio: 0.57 ratio — ABNORMAL LOW (ref 2.04–10.37)
Free Lambda Lt Chains,Ur: 0.94 mg/dL — ABNORMAL HIGH (ref 0.02–0.67)
Gamma Globulin, Urine: DETECTED — AB
Total Protein, Urine: 3.6 mg/dL

## 2012-09-18 LAB — PROTEIN ELECTROPHORESIS, SERUM
Albumin ELP: 55.6 % — ABNORMAL LOW (ref 55.8–66.1)
Alpha-1-Globulin: 7.3 % — ABNORMAL HIGH (ref 2.9–4.9)
Alpha-2-Globulin: 10.5 % (ref 7.1–11.8)
Beta 2: 5.9 % (ref 3.2–6.5)
Beta Globulin: 7.3 % — ABNORMAL HIGH (ref 4.7–7.2)
Gamma Globulin: 13.4 % (ref 11.1–18.8)
Total Protein, Serum Electrophoresis: 6.2 g/dL (ref 6.0–8.3)

## 2012-09-18 MED ORDER — AMOXICILLIN-POT CLAVULANATE 875-125 MG PO TABS: 1.0000 | ORAL_TABLET | Freq: Two times a day (BID) | ORAL | Status: DC

## 2012-09-18 NOTE — Progress Notes (Signed)
Subjective:    [name], female    DOB: 08-10-1929, 79 y.o.   MRN: [id number]  HPI  Patient is a pleasant 79 y/o female who presents to clinic with fever 101, chills, cough, rhinorrhea . Her symptoms began ~ 1 week ago. She first thought she would wait it out and it would eventually get better. Her symptoms are now also in her chest. She feels chest congestion. Patient has tried some OTC allergy medication. She has been taking tylenol for her fever.    Current Outpatient Prescriptions on File Prior to Visit  Medication Sig Dispense Refill  . diazepam (VALIUM) 2 MG tablet Take 2.5 tablets (5 mg total) by mouth every 12 (twelve) hours as needed for anxiety.  60 tablet  1  . esomeprazole (NEXIUM) 40 MG capsule Take 1 capsule (40 mg total) by mouth daily.  30 capsule  3  . famotidine (PEPCID) 20 MG tablet Take 1 tablet (20 mg total) by mouth 2 (two) times daily.  30 tablet  3  . levothyroxine (SYNTHROID, LEVOTHROID) 50 MCG tablet TAKE ONE TABLET BY MOUTH DAILY  30 tablet  1  . lisinopril (PRINIVIL,ZESTRIL) 5 MG tablet Take 5 mg by mouth daily.      Marland Kitchen loratadine (CLARITIN) 10 MG tablet Take 1 tablet (10 mg total) by mouth daily.  30 tablet  11  . metoprolol (LOPRESSOR) 100 MG tablet TAKE 1 TABLET BY MOUTH TWICE DAILY  180 tablet  1  . ondansetron (ZOFRAN) 4 MG tablet Take 1 tablet (4 mg total) by mouth every 8 (eight) hours as needed for nausea.  20 tablet  0  . ranitidine (ZANTAC) 150 MG tablet Take 1 tablet (150 mg total) by mouth 2 (two) times daily.  90 tablet  3  . Simethicone (GAS-X PO) Take by mouth as needed       . Tetrahydrozoline HCl (EYE DROPS OP) Always eye drops- one drop two times daily in both eyes      . traMADol (ULTRAM) 50 MG tablet Take 1 tablet (50 mg total) by mouth every 8 (eight) hours as needed for pain.  90 tablet  0  . warfarin (COUMADIN) 1 MG tablet TAKE 1 TABLET BY MOUTH AS DIRECTED ALONG WITH 5 MG ON MONDAY , WEDNESDAY AND FRIDAY  30 tablet  0  .  warfarin (COUMADIN) 5 MG tablet Take 1 tablet (5 mg total) by mouth daily.  30 tablet  11   No current facility-administered medications on file prior to visit.     Review of Systems  Constitutional: Positive for fever and chills.  HENT: Positive for congestion, sore throat, rhinorrhea, postnasal drip and sinus pressure.   Respiratory: Positive for cough. Negative for shortness of breath and wheezing.   Cardiovascular: Negative for chest pain.  Gastrointestinal: Positive for diarrhea. Negative for nausea and vomiting.       Diarrhea yesterday. She reports this has cleared up.  Neurological: Negative.   Psychiatric/Behavioral: Negative.    BP 106/68  Pulse 95  Temp(Src) 98 F (36.7 C) (Oral)  Resp 12  Wt 148 lb 8 oz (67.359 kg)  BMI 26.31 kg/m2  SpO2 95%    Objective:   Physical Exam  Constitutional: She is oriented to person, place, and time. She appears well-developed and well-nourished.  Acutely ill appearing.  HENT:  Head: Normocephalic and atraumatic.  Right Ear: External ear normal.  Left Ear: External ear normal.  Pharyngeal erythema  Cardiovascular: Normal rate, regular rhythm  and normal heart sounds.  Exam reveals no gallop.   No murmur heard. Pulmonary/Chest: Effort normal and breath sounds normal. No respiratory distress. She has no wheezes. She has no rales.  Abdominal: Soft. Bowel sounds are normal.  Musculoskeletal: Normal range of motion. She exhibits no edema and no tenderness.  Lymphadenopathy:    She has no cervical adenopathy.  Neurological: She is alert and oriented to person, place, and time.  Skin: Skin is warm and dry. No rash noted. No erythema. No pallor.  Psychiatric: She has a normal mood and affect. Her behavior is normal. Judgment and thought content normal.          Assessment & Plan:

## 2012-09-18 NOTE — Addendum Note (Signed)
Addended by: Crecencio Mc on: 09/18/2012 11:21 PM   Modules accepted: Orders

## 2012-09-18 NOTE — Assessment & Plan Note (Signed)
Fever, yellow drainage. Start Augmentin twice a day x10 days. Instructed patient to return Wednesday morning for PT/INR check. Continue Tylenol for fever and general discomfort.

## 2012-09-18 NOTE — Patient Instructions (Addendum)
  Start antibiotic today. You will take Augmentin twice daily for 10 days.  Return to the clinic on Wednesday morning (June 4) to have your PT/INR checked. The antibiotic interferes with your coumadin and can make your blood thinner.  Take tylenol for fever or general discomfort.  Drink fluids to stay hydrated.  Call if your symptoms do not improve within 3-4 days.

## 2012-09-20 ENCOUNTER — Other Ambulatory Visit (INDEPENDENT_AMBULATORY_CARE_PROVIDER_SITE_OTHER): Payer: Medicare Other

## 2012-09-20 ENCOUNTER — Ambulatory Visit: Payer: Self-pay | Admitting: Oncology

## 2012-09-20 DIAGNOSIS — Z7901 Long term (current) use of anticoagulants: Secondary | ICD-10-CM | POA: Diagnosis not present

## 2012-09-20 DIAGNOSIS — E039 Hypothyroidism, unspecified: Secondary | ICD-10-CM | POA: Diagnosis not present

## 2012-09-20 DIAGNOSIS — I4891 Unspecified atrial fibrillation: Secondary | ICD-10-CM

## 2012-09-20 DIAGNOSIS — N1330 Unspecified hydronephrosis: Secondary | ICD-10-CM | POA: Diagnosis not present

## 2012-09-20 DIAGNOSIS — D649 Anemia, unspecified: Secondary | ICD-10-CM | POA: Diagnosis not present

## 2012-09-20 DIAGNOSIS — K589 Irritable bowel syndrome without diarrhea: Secondary | ICD-10-CM | POA: Diagnosis not present

## 2012-09-20 DIAGNOSIS — IMO0002 Reserved for concepts with insufficient information to code with codable children: Secondary | ICD-10-CM | POA: Diagnosis not present

## 2012-09-20 DIAGNOSIS — R5381 Other malaise: Secondary | ICD-10-CM | POA: Diagnosis not present

## 2012-09-20 DIAGNOSIS — K219 Gastro-esophageal reflux disease without esophagitis: Secondary | ICD-10-CM | POA: Diagnosis not present

## 2012-09-20 DIAGNOSIS — Z79899 Other long term (current) drug therapy: Secondary | ICD-10-CM | POA: Diagnosis not present

## 2012-09-20 DIAGNOSIS — Z5181 Encounter for therapeutic drug level monitoring: Secondary | ICD-10-CM | POA: Diagnosis not present

## 2012-09-20 LAB — PROTIME-INR
INR: 2.7 ratio — ABNORMAL HIGH (ref 0.8–1.0)
Prothrombin Time: 27.6 s — ABNORMAL HIGH (ref 10.2–12.4)

## 2012-09-25 ENCOUNTER — Telehealth: Payer: Self-pay

## 2012-09-25 NOTE — Telephone Encounter (Signed)
Patient come by to drop off her lab, and she wanted to know since she is currently on a antibiotic is it okay to take a laxative with the antibiotic.

## 2012-09-26 ENCOUNTER — Ambulatory Visit: Payer: Self-pay | Admitting: Oncology

## 2012-09-26 ENCOUNTER — Other Ambulatory Visit: Payer: Self-pay | Admitting: *Deleted

## 2012-09-26 ENCOUNTER — Other Ambulatory Visit (INDEPENDENT_AMBULATORY_CARE_PROVIDER_SITE_OTHER): Payer: Medicare Other

## 2012-09-26 DIAGNOSIS — Z1211 Encounter for screening for malignant neoplasm of colon: Secondary | ICD-10-CM

## 2012-09-26 LAB — FECAL OCCULT BLOOD, IMMUNOCHEMICAL: Fecal Occult Bld: NEGATIVE

## 2012-09-26 NOTE — Telephone Encounter (Signed)
Notified pt to suspend the laxative and suspend the coumadin on days 3 and 5 of the antibiotic because her INR will change a lot on the abx

## 2012-09-26 NOTE — Telephone Encounter (Signed)
I would suspend the laxative,  I would also suspend the coumadin on days 3 and 5 of the antibiotic because her INR will change a lot on the abx

## 2012-09-28 DIAGNOSIS — D649 Anemia, unspecified: Secondary | ICD-10-CM | POA: Diagnosis not present

## 2012-09-28 DIAGNOSIS — R5381 Other malaise: Secondary | ICD-10-CM | POA: Diagnosis not present

## 2012-09-28 DIAGNOSIS — R5383 Other fatigue: Secondary | ICD-10-CM | POA: Diagnosis not present

## 2012-09-28 LAB — CBC CANCER CENTER
Basophil #: 0.1 x10 3/mm (ref 0.0–0.1)
Basophil %: 1.3 %
Eosinophil #: 0.1 x10 3/mm (ref 0.0–0.7)
Eosinophil %: 2.2 %
HCT: 34.4 % — ABNORMAL LOW (ref 35.0–47.0)
HGB: 11.8 g/dL — ABNORMAL LOW (ref 12.0–16.0)
Lymphocyte #: 1.4 x10 3/mm (ref 1.0–3.6)
Lymphocyte %: 26.5 %
MCH: 30.5 pg (ref 26.0–34.0)
MCHC: 34.2 g/dL (ref 32.0–36.0)
MCV: 89 fL (ref 80–100)
Monocyte #: 0.4 x10 3/mm (ref 0.2–0.9)
Monocyte %: 7.9 %
Neutrophil #: 3.4 x10 3/mm (ref 1.4–6.5)
Neutrophil %: 62.1 %
Platelet: 186 x10 3/mm (ref 150–440)
RBC: 3.85 10*6/uL (ref 3.80–5.20)
RDW: 13.3 % (ref 11.5–14.5)
WBC: 5.5 x10 3/mm (ref 3.6–11.0)

## 2012-09-28 LAB — IRON AND TIBC
Iron Bind.Cap.(Total): 334 ug/dL (ref 250–450)
Iron Saturation: 31 %
Iron: 103 ug/dL (ref 50–170)
Unbound Iron-Bind.Cap.: 231 ug/dL

## 2012-09-28 LAB — FERRITIN: Ferritin (ARMC): 65 ng/mL (ref 8–388)

## 2012-09-28 LAB — LACTATE DEHYDROGENASE: LDH: 198 U/L (ref 81–246)

## 2012-10-02 LAB — PROT IMMUNOELECTROPHORES(ARMC)

## 2012-10-03 DIAGNOSIS — R0602 Shortness of breath: Secondary | ICD-10-CM | POA: Diagnosis not present

## 2012-10-03 DIAGNOSIS — I4891 Unspecified atrial fibrillation: Secondary | ICD-10-CM | POA: Diagnosis not present

## 2012-10-03 DIAGNOSIS — E782 Mixed hyperlipidemia: Secondary | ICD-10-CM | POA: Diagnosis not present

## 2012-10-03 DIAGNOSIS — I5022 Chronic systolic (congestive) heart failure: Secondary | ICD-10-CM | POA: Diagnosis not present

## 2012-10-05 ENCOUNTER — Other Ambulatory Visit: Payer: Medicare Other

## 2012-10-05 DIAGNOSIS — I4891 Unspecified atrial fibrillation: Secondary | ICD-10-CM | POA: Diagnosis not present

## 2012-10-12 LAB — FECAL OCCULT BLOOD, GUAIAC: Fecal Occult Blood: NEGATIVE

## 2012-10-17 ENCOUNTER — Ambulatory Visit: Payer: Self-pay | Admitting: Oncology

## 2012-10-19 ENCOUNTER — Other Ambulatory Visit: Payer: Medicare Other

## 2012-11-09 DIAGNOSIS — R21 Rash and other nonspecific skin eruption: Secondary | ICD-10-CM | POA: Diagnosis not present

## 2012-11-09 DIAGNOSIS — E782 Mixed hyperlipidemia: Secondary | ICD-10-CM | POA: Diagnosis not present

## 2012-11-09 DIAGNOSIS — I4891 Unspecified atrial fibrillation: Secondary | ICD-10-CM | POA: Diagnosis not present

## 2012-11-15 ENCOUNTER — Other Ambulatory Visit: Payer: Self-pay | Admitting: Internal Medicine

## 2012-12-01 ENCOUNTER — Encounter: Payer: Self-pay | Admitting: Internal Medicine

## 2012-12-01 ENCOUNTER — Ambulatory Visit (INDEPENDENT_AMBULATORY_CARE_PROVIDER_SITE_OTHER): Payer: Medicare Other | Admitting: Internal Medicine

## 2012-12-01 VITALS — BP 124/64 | HR 64 | Temp 98.0°F | Resp 12 | Wt 149.8 lb

## 2012-12-01 DIAGNOSIS — Z7901 Long term (current) use of anticoagulants: Secondary | ICD-10-CM | POA: Diagnosis not present

## 2012-12-01 DIAGNOSIS — R21 Rash and other nonspecific skin eruption: Secondary | ICD-10-CM | POA: Diagnosis not present

## 2012-12-01 DIAGNOSIS — I4891 Unspecified atrial fibrillation: Secondary | ICD-10-CM

## 2012-12-01 DIAGNOSIS — D509 Iron deficiency anemia, unspecified: Secondary | ICD-10-CM | POA: Diagnosis not present

## 2012-12-01 DIAGNOSIS — D696 Thrombocytopenia, unspecified: Secondary | ICD-10-CM

## 2012-12-01 DIAGNOSIS — L299 Pruritus, unspecified: Secondary | ICD-10-CM

## 2012-12-01 LAB — CBC WITH DIFFERENTIAL/PLATELET
Basophils Absolute: 0 10*3/uL (ref 0.0–0.1)
Basophils Relative: 0.5 % (ref 0.0–3.0)
Eosinophils Absolute: 0.1 10*3/uL (ref 0.0–0.7)
Eosinophils Relative: 2.2 % (ref 0.0–5.0)
HCT: 33.3 % — ABNORMAL LOW (ref 36.0–46.0)
Hemoglobin: 11.3 g/dL — ABNORMAL LOW (ref 12.0–15.0)
Lymphocytes Relative: 25.8 % (ref 12.0–46.0)
Lymphs Abs: 1.4 10*3/uL (ref 0.7–4.0)
MCHC: 33.8 g/dL (ref 30.0–36.0)
MCV: 90.5 fl (ref 78.0–100.0)
Monocytes Absolute: 0.5 10*3/uL (ref 0.1–1.0)
Monocytes Relative: 9.5 % (ref 3.0–12.0)
Neutro Abs: 3.3 10*3/uL (ref 1.4–7.7)
Neutrophils Relative %: 62 % (ref 43.0–77.0)
Platelets: 123 10*3/uL — ABNORMAL LOW (ref 150.0–400.0)
RBC: 3.68 Mil/uL — ABNORMAL LOW (ref 3.87–5.11)
RDW: 13.4 % (ref 11.5–14.6)
WBC: 5.4 10*3/uL (ref 4.5–10.5)

## 2012-12-01 LAB — PROTIME-INR
INR: 1.7 ratio — ABNORMAL HIGH (ref 0.8–1.0)
Prothrombin Time: 18.1 s — ABNORMAL HIGH (ref 10.2–12.4)

## 2012-12-01 LAB — FERRITIN: Ferritin: 31.2 ng/mL (ref 10.0–291.0)

## 2012-12-01 MED ORDER — HYDROXYZINE PAMOATE 25 MG PO CAPS: 25.0000 mg | ORAL_CAPSULE | Freq: Three times a day (TID) | ORAL | Status: DC | PRN

## 2012-12-01 NOTE — Progress Notes (Signed)
[name], female   DOB: 06/12/1929, 79 y.o.   MRN: [id number]   Patient Active Problem List   Diagnosis Date Noted  . Pruritic condition 12/03/2012  . Anemia 09/10/2012  . Dyspnea on exertion 08/12/2012  . Chronic lower back pain 08/12/2012  . Urge urinary incontinence 08/12/2012  . Personal history of colonic polyps 08/12/2012  . Seasonal rhinitis 01/16/2012  . Hiatal hernia with gastroesophageal reflux   . Controlled atrial fibrillation 12/15/2010  . Irritable bowel syndrome without diarrhea 12/15/2010  . Hypothyroid 12/15/2010  . Long term (current) use of anticoagulants 12/15/2010    Subjective:  CC:   Chief Complaint  Patient presents with  . Acute Visit    skin itching and rash on right arm.    HPI:   Alexandra Martinez a 79 y.o. female who presents Persistent Itching and redness involving both arms and her back.  Symptoms present for several months .  thinks it may be a medication effect brought on by changes in medications done by her cardiologist [name]. She is miserable du eto itching "from the top of my head all the way down."  She has been using [employer] and  body wash per dermatologist.  And applies Eucerin moisturizer daily . Rash is aggravated by rubbing.  But occuring on her back as well.  Using Laundry detergent whose name she can't remember. But getrting ready to switch to a hypoallergenic one. No household pets,  No new plants or carpet.   Regarding her hypertension and atrial fibrillation, [name] had stopped her lisinopril bc of the rash.  No change.   Then changed her warfarin to Eliquis .  No change.   Then changed Eliquis to Xarelto.      Past Medical History  Diagnosis Date  . Hypothyroidism   . Osteoporosis   . Recurrent UTI   . Hydronephrosis     uretero- with congenital UPJ obstruction- [name]  . GERD (gastroesophageal reflux disease)   . DDD (degenerative disc disease)   . Atrial fib/flutter, transient     [facility]  admission for RVR 03/2009  . Hydronephrosis with ureteropelvic junction obstruction   . Hiatal hernia with gastroesophageal reflux 2008    by EGD,  [name]    Past Surgical History  Procedure Laterality Date  . Cataract extraction      left  . Cholecystectomy    . Cervical disc surgery  1980's  . Thyroid lobectomy      partial, right lobe  . Total abdominal hysterectomy w/ bilateral salpingoophorectomy         The following portions of the patient's history were reviewed and updated as appropriate: Allergies, current medications, and problem list.    Review of Systems:   12 Pt  review of systems was negative except those addressed in the HPI,     History   Social History  . Marital Status: Widowed    Spouse Name: N/A    Number of Children: N/A  . Years of Education: N/A   Occupational History  . Not on file.   Social History Main Topics  . Smoking status: Never Smoker   . Smokeless tobacco: Never Used  . Alcohol Use: No  . Drug Use: No  . Sexual Activity: Not on file   Other Topics Concern  . Not on file   Social History Narrative   Lives alone, widowed in 2005.   Husband was a smoker.    Objective:  Filed Vitals:   12/01/12 1024  BP: 124/64  Pulse: 64  Temp: 98 F (36.7 C)  Resp: 12     General appearance: alert, cooperative and appears stated age Ears: normal TM's and external ear canals both ears Throat: lips, mucosa, and tongue normal; teeth and gums normal Neck: no adenopathy, no carotid bruit, supple, symmetrical, trachea midline and thyroid not enlarged, symmetric, no tenderness/mass/nodules Back: symmetric, no curvature. ROM normal. No CVA tenderness. Lungs: clear to auscultation bilaterally Heart: regular rate and rhythm, S1, S2 normal, no murmur, click, rub or gallop Abdomen: soft, non-tender; bowel sounds normal; no masses,  no organomegaly Pulses: 2+ and symmetric Skin: Skin color, texture, turgor normal. No rashes or lesions.  blanching erythema noted after rubbing of skin Lymph nodes: Cervical, supraclavicular, and axillary nodes normal.  Assessment and Plan:  Long term (current) use of anticoagulants Warfarin has been stopped by cardiologist and now taking Eliquis.  Pruritic condition Recommended changing laundry detergents,  Use of cliaritin daily and prn hydroxyzine ,  If no improvement ,  Allergy testing recommended,   A total of 25 minutes was spent with patient more than half of which was spent in counseling, reviewing records from other prviders and coordination of care.  Updated Medication List Outpatient Encounter Prescriptions as of 12/01/2012  Medication Sig Dispense Refill  . levothyroxine (SYNTHROID, LEVOTHROID) 50 MCG tablet TAKE 1 TABLET BY MOUTH EVERY DAY  30 tablet  9  . loratadine (CLARITIN) 10 MG tablet Take 1 tablet (10 mg total) by mouth daily.  30 tablet  11  . metoprolol (LOPRESSOR) 100 MG tablet TAKE 1 TABLET BY MOUTH TWICE DAILY  180 tablet  1  . ondansetron (ZOFRAN) 4 MG tablet Take 1 tablet (4 mg total) by mouth every 8 (eight) hours as needed for nausea.  20 tablet  0  . pantoprazole (PROTONIX) 40 MG tablet Take 40 mg by mouth daily.      . Rivaroxaban (XARELTO) 15 MG TABS tablet Take 15 mg by mouth daily.      . Simethicone (GAS-X PO) Take by mouth as needed       . Tetrahydrozoline HCl (EYE DROPS OP) Always eye drops- one drop two times daily in both eyes      . traMADol (ULTRAM) 50 MG tablet Take 1 tablet (50 mg total) by mouth every 8 (eight) hours as needed for pain.  90 tablet  0  . diazepam (VALIUM) 2 MG tablet Take 2.5 tablets (5 mg total) by mouth every 12 (twelve) hours as needed for anxiety.  60 tablet  1  . esomeprazole (NEXIUM) 40 MG capsule Take 1 capsule (40 mg total) by mouth daily.  30 capsule  3  . famotidine (PEPCID) 20 MG tablet Take 1 tablet (20 mg total) by mouth 2 (two) times daily.  30 tablet  3  . hydrOXYzine (VISTARIL) 25 MG capsule Take 1 capsule (25 mg  total) by mouth 3 (three) times daily as needed for itching.  60 capsule  3  . [DISCONTINUED] amoxicillin-clavulanate (AUGMENTIN) 875-125 MG per tablet Take 1 tablet by mouth 2 (two) times daily.  20 tablet  0  . [DISCONTINUED] lisinopril (PRINIVIL,ZESTRIL) 5 MG tablet Take 5 mg by mouth daily.      . [DISCONTINUED] ranitidine (ZANTAC) 150 MG tablet Take 1 tablet (150 mg total) by mouth 2 (two) times daily.  90 tablet  3  . [DISCONTINUED] warfarin (COUMADIN) 1 MG tablet TAKE 1 TABLET BY MOUTH AS DIRECTED ALONG  WITH 5 MG ON MONDAY , WEDNESDAY AND FRIDAY  30 tablet  0  . [DISCONTINUED] warfarin (COUMADIN) 5 MG tablet Take 1 tablet (5 mg total) by mouth daily.  30 tablet  11   No facility-administered encounter medications on file as of 12/01/2012.     Orders Placed This Encounter  Procedures  . CBC with Differential  . Ferritin  . Iron and TIBC    No Follow-up on file.

## 2012-12-01 NOTE — Patient Instructions (Addendum)
Your itching may be from your laundry detergent or from something in your environment  Resume the loratidine every day, and change your laundry detergent to  "hypoallergenic " formula.  Put your clothes through an extra rinse cycle if possible.  If you need something additional for itching the the hydroxyzine I have given you.  If your symptoms do not improve in 2 weeks,  We will refer you for allergy testing

## 2012-12-02 LAB — IRON AND TIBC
%SAT: 18 % — ABNORMAL LOW (ref 20–55)
Iron: 60 ug/dL (ref 42–145)
TIBC: 340 ug/dL (ref 250–470)
UIBC: 280 ug/dL (ref 125–400)

## 2012-12-03 ENCOUNTER — Encounter: Payer: Self-pay | Admitting: Internal Medicine

## 2012-12-03 DIAGNOSIS — L299 Pruritus, unspecified: Secondary | ICD-10-CM | POA: Insufficient documentation

## 2012-12-03 DIAGNOSIS — R21 Rash and other nonspecific skin eruption: Secondary | ICD-10-CM | POA: Insufficient documentation

## 2012-12-03 NOTE — Addendum Note (Signed)
Addended by: Crecencio Mc on: 12/03/2012 06:07 PM   Modules accepted: Orders

## 2012-12-03 NOTE — Assessment & Plan Note (Signed)
Warfarin has been stopped by cardiologist and now taking Eliquis.

## 2012-12-03 NOTE — Assessment & Plan Note (Signed)
Recommended changing laundry detergents,  Use of cliaritin daily and prn hydroxyzine ,  If no improvement ,  Allergy testing recommended,

## 2012-12-11 DIAGNOSIS — I4891 Unspecified atrial fibrillation: Secondary | ICD-10-CM | POA: Diagnosis not present

## 2012-12-11 DIAGNOSIS — Z7901 Long term (current) use of anticoagulants: Secondary | ICD-10-CM | POA: Diagnosis not present

## 2012-12-11 DIAGNOSIS — I10 Essential (primary) hypertension: Secondary | ICD-10-CM | POA: Diagnosis not present

## 2012-12-22 ENCOUNTER — Ambulatory Visit (INDEPENDENT_AMBULATORY_CARE_PROVIDER_SITE_OTHER): Payer: Medicare Other | Admitting: Adult Health

## 2012-12-22 ENCOUNTER — Encounter: Payer: Self-pay | Admitting: Adult Health

## 2012-12-22 VITALS — BP 114/70 | HR 64 | Temp 98.2°F | Resp 12 | Wt 152.0 lb

## 2012-12-22 DIAGNOSIS — R300 Dysuria: Secondary | ICD-10-CM | POA: Diagnosis not present

## 2012-12-22 LAB — POCT URINALYSIS DIPSTICK
Bilirubin, UA: NEGATIVE
Glucose, UA: NEGATIVE
Ketones, UA: NEGATIVE
Nitrite, UA: NEGATIVE
Protein, UA: NEGATIVE
Spec Grav, UA: 1.015
Urobilinogen, UA: 0.2
pH, UA: 7

## 2012-12-22 MED ORDER — CIPROFLOXACIN HCL 250 MG PO TABS: 250.0000 mg | ORAL_TABLET | Freq: Two times a day (BID) | ORAL | Status: AC

## 2012-12-22 NOTE — Assessment & Plan Note (Signed)
UA dipstick shows moderate blood, neg nitrite and small leukocytes. Start cipro 250 mg bid x 5 days. Send urine for culture.

## 2012-12-22 NOTE — Progress Notes (Signed)
  Subjective:    [name], female    DOB: 16-Jan-1930, 79 y.o.   MRN: [id number]  HPI  Patient presents with urgency and dysuria which began this morning. Denies fever or chills. She has not taken any OTC medications. She was experiencing some right flank discomfort but denies any suprapubic discomfort.   Current Outpatient Prescriptions on File Prior to Visit  Medication Sig Dispense Refill  . hydrOXYzine (VISTARIL) 25 MG capsule Take 1 capsule (25 mg total) by mouth 3 (three) times daily as needed for itching.  60 capsule  3  . levothyroxine (SYNTHROID, LEVOTHROID) 50 MCG tablet TAKE 1 TABLET BY MOUTH EVERY DAY  30 tablet  9  . loratadine (CLARITIN) 10 MG tablet Take 1 tablet (10 mg total) by mouth daily.  30 tablet  11  . metoprolol (LOPRESSOR) 100 MG tablet TAKE 1 TABLET BY MOUTH TWICE DAILY  180 tablet  1  . ondansetron (ZOFRAN) 4 MG tablet Take 1 tablet (4 mg total) by mouth every 8 (eight) hours as needed for nausea.  20 tablet  0  . pantoprazole (PROTONIX) 40 MG tablet Take 40 mg by mouth daily.      . Rivaroxaban (XARELTO) 15 MG TABS tablet Take 15 mg by mouth daily.      . Simethicone (GAS-X PO) Take by mouth as needed       . Tetrahydrozoline HCl (EYE DROPS OP) Always eye drops- one drop two times daily in both eyes      . traMADol (ULTRAM) 50 MG tablet Take 1 tablet (50 mg total) by mouth every 8 (eight) hours as needed for pain.  90 tablet  0   No current facility-administered medications on file prior to visit.    Review of Systems  Constitutional: Negative for fever and chills.  Genitourinary: Positive for dysuria, urgency, frequency and flank pain. Negative for hematuria and difficulty urinating.    BP 114/70  Pulse 64  Temp(Src) 98.2 F (36.8 C) (Oral)  Resp 12  Wt 152 lb (68.947 kg)  BMI 26.93 kg/m2     Objective:   Physical Exam  Constitutional: She is oriented to person, place, and time. She appears well-developed and well-nourished. No  distress.  Cardiovascular: Normal rate and regular rhythm.   Pulmonary/Chest: Effort normal. No respiratory distress.  Genitourinary:  No suprapubic discomfort. No costovertebral angle tenderness.  Neurological: She is alert and oriented to person, place, and time.  Psychiatric: She has a normal mood and affect. Her behavior is normal. Judgment and thought content normal.      Assessment & Plan:

## 2012-12-22 NOTE — Patient Instructions (Addendum)
  Start cipro 250 mg twice a day for 5 days.  Urinary Tract Infection Urinary tract infections (UTIs) can develop anywhere along your urinary tract. Your urinary tract is your body's drainage system for removing wastes and extra water. Your urinary tract includes two kidneys, two ureters, a bladder, and a urethra. Your kidneys are a pair of bean-shaped organs. Each kidney is about the size of your fist. They are located below your ribs, one on each side of your spine. CAUSES Infections are caused by microbes, which are microscopic organisms, including fungi, viruses, and bacteria. These organisms are so small that they can only be seen through a microscope. Bacteria are the microbes that most commonly cause UTIs. SYMPTOMS  Symptoms of UTIs may vary by age and gender of the patient and by the location of the infection. Symptoms in young women typically include a frequent and intense urge to urinate and a painful, burning feeling in the bladder or urethra during urination. Older women and men are more likely to be tired, shaky, and weak and have muscle aches and abdominal pain. A fever may mean the infection is in your kidneys. Other symptoms of a kidney infection include pain in your back or sides below the ribs, nausea, and vomiting. DIAGNOSIS To diagnose a UTI, your caregiver will ask you about your symptoms. Your caregiver also will ask to provide a urine sample. The urine sample will be tested for bacteria and white blood cells. White blood cells are made by your body to help fight infection. TREATMENT  Typically, UTIs can be treated with medication. Because most UTIs are caused by a bacterial infection, they usually can be treated with the use of antibiotics. The choice of antibiotic and length of treatment depend on your symptoms and the type of bacteria causing your infection. HOME CARE INSTRUCTIONS  If you were prescribed antibiotics, take them exactly as your caregiver instructs you. Finish the  medication even if you feel better after you have only taken some of the medication.  Drink enough water and fluids to keep your urine clear or pale yellow.  Avoid caffeine, tea, and carbonated beverages. They tend to irritate your bladder.  Empty your bladder often. Avoid holding urine for long periods of time.  Empty your bladder before and after sexual intercourse.  After a bowel movement, women should cleanse from front to back. Use each tissue only once. SEEK MEDICAL CARE IF:   You have back pain.  You develop a fever.  Your symptoms do not begin to resolve within 3 days. SEEK IMMEDIATE MEDICAL CARE IF:   You have severe back pain or lower abdominal pain.  You develop chills.  You have nausea or vomiting.  You have continued burning or discomfort with urination. MAKE SURE YOU:   Understand these instructions.  Will watch your condition.  Will get help right away if you are not doing well or get worse. Document Released: 01/13/2005 Document Revised: 10/05/2011 Document Reviewed: 05/14/2011 [facility] Patient Information 2014 [place].

## 2012-12-24 LAB — URINE CULTURE: Colony Count: 25000

## 2012-12-26 ENCOUNTER — Other Ambulatory Visit: Payer: Medicare Other

## 2013-01-01 ENCOUNTER — Ambulatory Visit: Payer: Self-pay | Admitting: Oncology

## 2013-01-03 ENCOUNTER — Other Ambulatory Visit (INDEPENDENT_AMBULATORY_CARE_PROVIDER_SITE_OTHER): Payer: Medicare Other

## 2013-01-03 DIAGNOSIS — D696 Thrombocytopenia, unspecified: Secondary | ICD-10-CM | POA: Diagnosis not present

## 2013-01-03 DIAGNOSIS — Z79899 Other long term (current) drug therapy: Secondary | ICD-10-CM

## 2013-01-03 DIAGNOSIS — H01009 Unspecified blepharitis unspecified eye, unspecified eyelid: Secondary | ICD-10-CM | POA: Diagnosis not present

## 2013-01-03 LAB — CBC WITH DIFFERENTIAL/PLATELET
Basophils Absolute: 0 10*3/uL (ref 0.0–0.1)
Basophils Relative: 0.5 % (ref 0.0–3.0)
Eosinophils Absolute: 0.2 10*3/uL (ref 0.0–0.7)
Eosinophils Relative: 3.2 % (ref 0.0–5.0)
HCT: 33.1 % — ABNORMAL LOW (ref 36.0–46.0)
Hemoglobin: 11.3 g/dL — ABNORMAL LOW (ref 12.0–15.0)
Lymphocytes Relative: 22.7 % (ref 12.0–46.0)
Lymphs Abs: 1.2 10*3/uL (ref 0.7–4.0)
MCHC: 34.1 g/dL (ref 30.0–36.0)
MCV: 89.7 fl (ref 78.0–100.0)
Monocytes Absolute: 0.5 10*3/uL (ref 0.1–1.0)
Monocytes Relative: 9.6 % (ref 3.0–12.0)
Neutro Abs: 3.3 10*3/uL (ref 1.4–7.7)
Neutrophils Relative %: 64 % (ref 43.0–77.0)
Platelets: 151 10*3/uL (ref 150.0–400.0)
RBC: 3.69 Mil/uL — ABNORMAL LOW (ref 3.87–5.11)
RDW: 12.9 % (ref 11.5–14.6)
WBC: 5.2 10*3/uL (ref 4.5–10.5)

## 2013-01-03 LAB — PROTIME-INR
INR: 1.6 ratio — ABNORMAL HIGH (ref 0.8–1.0)
Prothrombin Time: 16.5 s — ABNORMAL HIGH (ref 10.2–12.4)

## 2013-01-15 ENCOUNTER — Telehealth: Payer: Self-pay | Admitting: *Deleted

## 2013-01-15 NOTE — Telephone Encounter (Signed)
Alexandra Martinez, I saw patient for UTI and I ordered a culture. I think the labs she had drawn were released from order entered by Dr. Derrel Nip during previous visit.

## 2013-01-15 NOTE — Telephone Encounter (Signed)
Patient called for results of labs performed on 01/03/13? Please advise.

## 2013-01-16 NOTE — Telephone Encounter (Signed)
Patient calling for result on labs from 01/03/13 see labs but no notes , Looks as though [employer] ordered but she say s no that she only saw patient for UTI on 12/22/12 please advise as to labs.

## 2013-01-16 NOTE — Telephone Encounter (Signed)
Her hgb is unchanged from last month.

## 2013-01-16 NOTE — Telephone Encounter (Signed)
Patient notified as requested. 

## 2013-01-31 ENCOUNTER — Telehealth: Payer: Self-pay | Admitting: Internal Medicine

## 2013-01-31 NOTE — Telephone Encounter (Signed)
Pt called to schedule appt for rash.  No appt available.  Offered to try to get pt in [facility] location.  Pt states she will just go to a walk-in clinic to be seen for rash.  Appt made 10/23 for a follow up.

## 2013-02-01 DIAGNOSIS — I4891 Unspecified atrial fibrillation: Secondary | ICD-10-CM | POA: Diagnosis not present

## 2013-02-01 DIAGNOSIS — R21 Rash and other nonspecific skin eruption: Secondary | ICD-10-CM | POA: Diagnosis not present

## 2013-02-01 DIAGNOSIS — I059 Rheumatic mitral valve disease, unspecified: Secondary | ICD-10-CM | POA: Diagnosis not present

## 2013-02-01 DIAGNOSIS — E782 Mixed hyperlipidemia: Secondary | ICD-10-CM | POA: Diagnosis not present

## 2013-02-05 DIAGNOSIS — L821 Other seborrheic keratosis: Secondary | ICD-10-CM | POA: Diagnosis not present

## 2013-02-05 DIAGNOSIS — L578 Other skin changes due to chronic exposure to nonionizing radiation: Secondary | ICD-10-CM | POA: Diagnosis not present

## 2013-02-05 DIAGNOSIS — D692 Other nonthrombocytopenic purpura: Secondary | ICD-10-CM | POA: Diagnosis not present

## 2013-02-05 DIAGNOSIS — L280 Lichen simplex chronicus: Secondary | ICD-10-CM | POA: Diagnosis not present

## 2013-02-07 ENCOUNTER — Encounter: Payer: Self-pay | Admitting: *Deleted

## 2013-02-08 ENCOUNTER — Encounter: Payer: Self-pay | Admitting: Internal Medicine

## 2013-02-08 ENCOUNTER — Ambulatory Visit (INDEPENDENT_AMBULATORY_CARE_PROVIDER_SITE_OTHER): Payer: Medicare Other | Admitting: Internal Medicine

## 2013-02-08 VITALS — BP 140/78 | HR 58 | Temp 98.6°F | Resp 14 | Ht 63.0 in | Wt 155.2 lb

## 2013-02-08 DIAGNOSIS — M545 Low back pain, unspecified: Secondary | ICD-10-CM

## 2013-02-08 DIAGNOSIS — L299 Pruritus, unspecified: Secondary | ICD-10-CM

## 2013-02-08 DIAGNOSIS — K589 Irritable bowel syndrome without diarrhea: Secondary | ICD-10-CM | POA: Diagnosis not present

## 2013-02-08 DIAGNOSIS — Z23 Encounter for immunization: Secondary | ICD-10-CM | POA: Diagnosis not present

## 2013-02-08 DIAGNOSIS — G8929 Other chronic pain: Secondary | ICD-10-CM

## 2013-02-08 NOTE — Patient Instructions (Addendum)
I agree with the ammonium lactate cream  I would also resume the hydroxyzine  Capsules 25 mg every 8 hours to start with  You can alternate between tramadol and tylenol for the back pain  Referral to Dr Sharlet Salina the physiatrist at [facility]

## 2013-02-08 NOTE — Assessment & Plan Note (Signed)
No rash seen except for purpura caused by skin trauma caused by scratching, complicated by anticoagulation and ale related atrophy of skin.  hydroxyzine and ammonium lactacte cream

## 2013-02-08 NOTE — Progress Notes (Signed)
[name], female   DOB: July 05, 1929, 79 y.o.   MRN: [id number]   Patient Active Problem List   Diagnosis Date Noted  . Dysuria 12/22/2012  . Pruritic condition 12/03/2012  . Anemia 09/10/2012  . Dyspnea on exertion 08/12/2012  . Chronic lower back pain 08/12/2012  . Urge urinary incontinence 08/12/2012  . Personal history of colonic polyps 08/12/2012  . Seasonal rhinitis 01/16/2012  . Hiatal hernia with gastroesophageal reflux   . Controlled atrial fibrillation 12/15/2010  . Irritable bowel syndrome without diarrhea 12/15/2010  . Hypothyroid 12/15/2010  . Long term (current) use of anticoagulants 12/15/2010    Subjective:  CC:   Chief Complaint  Patient presents with  . Follow-up    HPI:   Alexandra Martinez a 79 y.o. female who presents for follow up on chronic conditions including  Rash. Persistent and chronic. Was treated by an MD at Urgent care recently at [facility]  Diagnosis was unclear.  Saw Dr. Koleen Nimrod who oprescrived ammonium lactate 12% cream yesterday  .   Back pain aggravating her again,  Years ago deferred surgery, had 3 shots instead after having a myelogram years ago in [place].    Can't do her house work without stopping frequently.  Right leg is hard to lift up because it pulls in her groin ,  Exam suggests tight hamstrings    Past Medical History  Diagnosis Date  . Hypothyroidism   . Osteoporosis   . Recurrent UTI   . Hydronephrosis     uretero- with congenital UPJ obstruction- [name]  . GERD (gastroesophageal reflux disease)   . DDD (degenerative disc disease)   . Atrial fib/flutter, transient     [facility] admission for RVR 03/2009  . Hydronephrosis with ureteropelvic junction obstruction   . Hiatal hernia with gastroesophageal reflux 2008    by EGD,  [name]    Past Surgical History  Procedure Laterality Date  . Cataract extraction      left  . Cholecystectomy    . Cervical disc surgery  1980's  . Thyroid lobectomy     partial, right lobe  . Total abdominal hysterectomy w/ bilateral salpingoophorectomy         The following portions of the patient's history were reviewed and updated as appropriate: Allergies, current medications, and problem list.    Review of Systems:   12 Pt  review of systems was negative except those addressed in the HPI,     History   Social History  . Marital Status: Widowed    Spouse Name: N/A    Number of Children: N/A  . Years of Education: N/A   Occupational History  . Not on file.   Social History Main Topics  . Smoking status: Never Smoker   . Smokeless tobacco: Never Used  . Alcohol Use: No  . Drug Use: No  . Sexual Activity: Not on file   Other Topics Concern  . Not on file   Social History Narrative   Lives alone, widowed in 2005.   Husband was a smoker.    Objective:  Filed Vitals:   02/08/13 0849  BP: 140/78  Pulse: 58  Temp: 98.6 F (37 C)  Resp: 14     General appearance: alert, cooperative and appears stated age Ears: normal TM's and external ear canals both ears Throat: lips, mucosa, and tongue normal; teeth and gums normal Neck: no adenopathy, no carotid bruit, supple, symmetrical, trachea midline and thyroid not enlarged, symmetric, no  tenderness/mass/nodules Back: symmetric, no curvature. ROM normal. No CVA tenderness. No vertebral tenderness Lungs: clear to auscultation bilaterally Heart: regular rate and rhythm, S1, S2 normal, no murmur, click, rub or gallop Abdomen: soft, non-tender; bowel sounds normal; no masses,  no organomegaly Pulses: 2+ and symmetric Skin: Skin color, texture, turgor normal. No rashes or lesions Lymph nodes: Cervical, supraclavicular, and axillary nodes normal. Neuro: CNs 2-12 intact. DTRs 2+/4 in biceps, brachioradialis, patellars and achilles. Muscle strength 5/5 in upper and lower exremities. Fine resting tremor bilaterally both hands cerebellar function normal. Romberg negative.  No pronator  drift.   Gait normal. Negative straight leg lift.   Assessment and Plan:  Pruritic condition No rash seen except for purpura caused by skin trauma caused by scratching, complicated by anticoagulation and ale related atrophy of skin.  hydroxyzine and ammonium lactacte cream   Chronic lower back pain Prior CT of cervical and lumbar spine with myelography in 2007 by Dr. Joya Salm showed C5 nerve root compression but no significant spinal stenosis in the lumbar area, just mild disk space narrowing L4-L5.  She has no radicular symptoms currently and has had no recent falls.  I do not feel that x rays at this point will serve any purpose.   referral to Dr Sharlet Salina for physiotherapy.    Irritable bowel syndrome without diarrhea Currently quiescent. Overshadowed by pruritic condition and back pain    Updated Medication List Outpatient Encounter Prescriptions as of 02/08/2013  Medication Sig Dispense Refill  . ammonium lactate (AMLACTIN) 12 % cream Apply 1 g topically 2 (two) times daily.      . hydrOXYzine (VISTARIL) 25 MG capsule Take 1 capsule (25 mg total) by mouth 3 (three) times daily as needed for itching.  60 capsule  3  . levothyroxine (SYNTHROID, LEVOTHROID) 50 MCG tablet TAKE 1 TABLET BY MOUTH EVERY DAY  30 tablet  9  . loratadine (CLARITIN) 10 MG tablet Take 1 tablet (10 mg total) by mouth daily.  30 tablet  11  . metoprolol (LOPRESSOR) 100 MG tablet TAKE 1 TABLET BY MOUTH TWICE DAILY  180 tablet  1  . ondansetron (ZOFRAN) 4 MG tablet Take 1 tablet (4 mg total) by mouth every 8 (eight) hours as needed for nausea.  20 tablet  0  . pantoprazole (PROTONIX) 40 MG tablet Take 40 mg by mouth daily.      . Rivaroxaban (XARELTO) 15 MG TABS tablet Take 15 mg by mouth daily.      . Simethicone (GAS-X PO) Take by mouth as needed       . Tetrahydrozoline HCl (EYE DROPS OP) Always eye drops- one drop two times daily in both eyes      . traMADol (ULTRAM) 50 MG tablet Take 1 tablet (50 mg total) by mouth  every 8 (eight) hours as needed for pain.  90 tablet  0   No facility-administered encounter medications on file as of 02/08/2013.     Orders Placed This Encounter  Procedures  . Flu Vaccine QUAD 36+ mos PF IM (Fluarix)  . Ambulatory referral to Sports Medicine    Return in about 3 months (around 05/11/2013).

## 2013-02-09 ENCOUNTER — Encounter: Payer: Self-pay | Admitting: Internal Medicine

## 2013-02-09 NOTE — Assessment & Plan Note (Addendum)
Prior CT of cervical and lumbar spine with myelography in 2007 by Dr. Joya Salm showed C5 nerve root compression but no significant spinal stenosis in the lumbar area, just mild disk space narrowing L4-L5.  She has no radicular symptoms currently and has had no recent falls.  I do not feel that x rays at this point will serve any purpose.   referral to Dr Sharlet Salina for physiotherapy.

## 2013-02-09 NOTE — Assessment & Plan Note (Signed)
Currently quiescent. Overshadowed by pruritic condition and back pain

## 2013-02-27 DIAGNOSIS — M5137 Other intervertebral disc degeneration, lumbosacral region: Secondary | ICD-10-CM | POA: Diagnosis not present

## 2013-02-27 DIAGNOSIS — IMO0002 Reserved for concepts with insufficient information to code with codable children: Secondary | ICD-10-CM | POA: Diagnosis not present

## 2013-02-27 DIAGNOSIS — M25559 Pain in unspecified hip: Secondary | ICD-10-CM | POA: Diagnosis not present

## 2013-03-09 ENCOUNTER — Ambulatory Visit: Payer: Self-pay | Admitting: Physical Medicine and Rehabilitation

## 2013-03-09 DIAGNOSIS — M48061 Spinal stenosis, lumbar region without neurogenic claudication: Secondary | ICD-10-CM | POA: Diagnosis not present

## 2013-03-09 DIAGNOSIS — M5137 Other intervertebral disc degeneration, lumbosacral region: Secondary | ICD-10-CM | POA: Diagnosis not present

## 2013-03-09 DIAGNOSIS — M51379 Other intervertebral disc degeneration, lumbosacral region without mention of lumbar back pain or lower extremity pain: Secondary | ICD-10-CM | POA: Diagnosis not present

## 2013-03-21 ENCOUNTER — Other Ambulatory Visit: Payer: Self-pay | Admitting: Internal Medicine

## 2013-03-21 DIAGNOSIS — M5137 Other intervertebral disc degeneration, lumbosacral region: Secondary | ICD-10-CM | POA: Diagnosis not present

## 2013-03-21 DIAGNOSIS — IMO0002 Reserved for concepts with insufficient information to code with codable children: Secondary | ICD-10-CM | POA: Diagnosis not present

## 2013-04-05 DIAGNOSIS — I10 Essential (primary) hypertension: Secondary | ICD-10-CM | POA: Diagnosis not present

## 2013-04-05 DIAGNOSIS — I4891 Unspecified atrial fibrillation: Secondary | ICD-10-CM | POA: Diagnosis not present

## 2013-04-05 DIAGNOSIS — I059 Rheumatic mitral valve disease, unspecified: Secondary | ICD-10-CM | POA: Diagnosis not present

## 2013-04-05 DIAGNOSIS — Z7901 Long term (current) use of anticoagulants: Secondary | ICD-10-CM | POA: Diagnosis not present

## 2013-04-06 ENCOUNTER — Other Ambulatory Visit: Payer: Self-pay | Admitting: Internal Medicine

## 2013-04-10 ENCOUNTER — Other Ambulatory Visit: Payer: Self-pay | Admitting: *Deleted

## 2013-04-10 MED ORDER — PANTOPRAZOLE SODIUM 40 MG PO TBEC: 40.0000 mg | DELAYED_RELEASE_TABLET | Freq: Every day | ORAL | Status: DC

## 2013-04-16 DIAGNOSIS — M5137 Other intervertebral disc degeneration, lumbosacral region: Secondary | ICD-10-CM | POA: Diagnosis not present

## 2013-04-16 DIAGNOSIS — IMO0002 Reserved for concepts with insufficient information to code with codable children: Secondary | ICD-10-CM | POA: Diagnosis not present

## 2013-04-16 DIAGNOSIS — M47817 Spondylosis without myelopathy or radiculopathy, lumbosacral region: Secondary | ICD-10-CM | POA: Diagnosis not present

## 2013-04-25 ENCOUNTER — Encounter: Payer: Self-pay | Admitting: Adult Health

## 2013-04-25 ENCOUNTER — Ambulatory Visit (INDEPENDENT_AMBULATORY_CARE_PROVIDER_SITE_OTHER): Payer: Medicare Other | Admitting: Adult Health

## 2013-04-25 VITALS — BP 128/62 | HR 68 | Temp 98.2°F | Resp 12 | Ht 63.0 in | Wt 156.5 lb

## 2013-04-25 DIAGNOSIS — K3189 Other diseases of stomach and duodenum: Secondary | ICD-10-CM

## 2013-04-25 DIAGNOSIS — R1013 Epigastric pain: Secondary | ICD-10-CM

## 2013-04-25 DIAGNOSIS — K30 Functional dyspepsia: Secondary | ICD-10-CM

## 2013-04-25 MED ORDER — SUCRALFATE 1 G PO TABS: ORAL_TABLET | ORAL | Status: DC

## 2013-04-25 NOTE — Patient Instructions (Signed)
  Start Carafate 1 gm four times a day. Take this medication 4 hours apart from your levothyroxine. You may take this medication 30 min - 1 hour prior to meals and at bedtime.   Return for follow up appointment in 1 month.  If your symptoms are not improved then I will send you for a CT scan.

## 2013-04-25 NOTE — Progress Notes (Signed)
Pre visit review using our clinic review tool, if applicable. No additional management support is needed unless otherwise documented below in the visit note. 

## 2013-04-25 NOTE — Progress Notes (Signed)
   Subjective:    [name], female    DOB: [birth year], 80 y.o.   MRN: [id number]  HPI  Patient is an 80 year old female who presents to clinic with complaints of stomach burning upon awakening. Reports pain right upper abdomen. She is also complaining of nausea. Symptoms began ~ 2 months ago. Burning is associated with what she eats. Burning is worse on an empty stomach. She is on protonix. Pt is s/p cholecystectomy.    Review of Systems  Constitutional: Negative.   HENT: Negative.   Respiratory: Negative.   Cardiovascular: Negative.   Gastrointestinal: Positive for abdominal pain. Negative for nausea, vomiting, diarrhea and constipation.       Burning sensation in stomach.  Neurological: Negative.   Psychiatric/Behavioral: Negative.        Objective:   Physical Exam  Constitutional: She is oriented to person, place, and time. No distress.  Cardiovascular: Normal rate and regular rhythm.   Pulmonary/Chest: Effort normal. No respiratory distress.  Abdominal: Soft. Bowel sounds are normal. She exhibits no distension and no mass. There is tenderness. There is no rebound and no guarding.  Epigastric pain upon palpation. She exhibits some tenderness in the right lower quadrant  Neurological: She is alert and oriented to person, place, and time.  Skin: Skin is warm and dry.  Psychiatric: She has a normal mood and affect. Her behavior is normal. Judgment and thought content normal.          Assessment & Plan:

## 2013-04-25 NOTE — Assessment & Plan Note (Addendum)
Start Carafate 4 times a day. 1 tablet 1 hour before meals and at bedtime. Instructed patient to take Carafate 4 hours apart from her levothyroxine. Return to clinic in one month for followup. If no improvement will send for CT scan, pelvic ultrasound. Note, greater than 30 min were spent in face to face communication in obtaining HPI, reviewing previous records (pt has had EGD and colonoscopy, she has been seen by GI for similar symptoms), in the planning and implementation of care pertaining to this problem.

## 2013-05-02 DIAGNOSIS — IMO0002 Reserved for concepts with insufficient information to code with codable children: Secondary | ICD-10-CM | POA: Diagnosis not present

## 2013-05-02 DIAGNOSIS — M5137 Other intervertebral disc degeneration, lumbosacral region: Secondary | ICD-10-CM | POA: Diagnosis not present

## 2013-05-02 DIAGNOSIS — M47817 Spondylosis without myelopathy or radiculopathy, lumbosacral region: Secondary | ICD-10-CM | POA: Diagnosis not present

## 2013-05-14 ENCOUNTER — Encounter: Payer: Self-pay | Admitting: Internal Medicine

## 2013-05-14 ENCOUNTER — Ambulatory Visit (INDEPENDENT_AMBULATORY_CARE_PROVIDER_SITE_OTHER): Payer: Medicare Other | Admitting: Internal Medicine

## 2013-05-14 VITALS — BP 118/68 | HR 56 | Temp 97.9°F | Resp 12 | Ht 63.0 in | Wt 156.4 lb

## 2013-05-14 DIAGNOSIS — L299 Pruritus, unspecified: Secondary | ICD-10-CM

## 2013-05-14 DIAGNOSIS — K30 Functional dyspepsia: Secondary | ICD-10-CM

## 2013-05-14 DIAGNOSIS — I4891 Unspecified atrial fibrillation: Secondary | ICD-10-CM

## 2013-05-14 DIAGNOSIS — R1013 Epigastric pain: Secondary | ICD-10-CM | POA: Diagnosis not present

## 2013-05-14 DIAGNOSIS — Z1159 Encounter for screening for other viral diseases: Secondary | ICD-10-CM

## 2013-05-14 DIAGNOSIS — G8929 Other chronic pain: Secondary | ICD-10-CM

## 2013-05-14 DIAGNOSIS — M545 Low back pain, unspecified: Secondary | ICD-10-CM

## 2013-05-14 DIAGNOSIS — Z7901 Long term (current) use of anticoagulants: Secondary | ICD-10-CM

## 2013-05-14 DIAGNOSIS — K3189 Other diseases of stomach and duodenum: Secondary | ICD-10-CM | POA: Diagnosis not present

## 2013-05-14 DIAGNOSIS — K589 Irritable bowel syndrome without diarrhea: Secondary | ICD-10-CM

## 2013-05-14 LAB — PROTIME-INR
INR: 1.6 ratio — ABNORMAL HIGH (ref 0.8–1.0)
Prothrombin Time: 16.7 s — ABNORMAL HIGH (ref 10.2–12.4)

## 2013-05-14 MED ORDER — ONDANSETRON HCL 4 MG PO TABS: 4.0000 mg | ORAL_TABLET | Freq: Three times a day (TID) | ORAL | Status: DC | PRN

## 2013-05-14 NOTE — Assessment & Plan Note (Signed)
Now under treatment by Dr. Loistine Chance over the night the is with epidural steroid injections.

## 2013-05-14 NOTE — Assessment & Plan Note (Signed)
Improved with use of her feet. Her symptoms appear to be due to irritable bowel chronic gastritis. She is a comprehensive GI workup in the past which noted a hiatal hernia gastroesophageal reflux and gastroparesis. Trial of Zofran for nausea to replace the morning dose of Carafate. Continue Protonix.

## 2013-05-14 NOTE — Progress Notes (Signed)
Pre-visit discussion using our clinic review tool. No additional management support is needed unless otherwise documented below in the visit note.  

## 2013-05-14 NOTE — Assessment & Plan Note (Signed)
Well controlled on current regimen, no changes today. 

## 2013-05-14 NOTE — Patient Instructions (Addendum)
Trial of zofran (ondansetron) take take in the morning instead of carafate.  If nausea improves, continue it daily and stop the carafate except prior to supper   Eat 6 smaller meals daily to avoid overfilling stomach.    Add Beano pill or drops to any meal containing beans , broccoli.  cauliflower or brussel sprouts   Return for fasting blood work and OV in March

## 2013-05-14 NOTE — Progress Notes (Signed)
[name], female   DOB: 1930/03/06, 80 y.o.   MRN: [id number]   Patient Active Problem List   Diagnosis Date Noted  . Burning sensation of stomach 04/25/2013  . Dysuria 12/22/2012  . Pruritic condition 12/03/2012  . Anemia 09/10/2012  . Dyspnea on exertion 08/12/2012  . Chronic lower back pain 08/12/2012  . Urge urinary incontinence 08/12/2012  . Personal history of colonic polyps 08/12/2012  . Seasonal rhinitis 01/16/2012  . Hiatal hernia with gastroesophageal reflux   . Controlled atrial fibrillation 12/15/2010  . Irritable bowel syndrome without diarrhea 12/15/2010  . Hypothyroid 12/15/2010  . Long term (current) use of anticoagulants 12/15/2010    Subjective:  CC:   Chief Complaint  Patient presents with  . Follow-up    3 month    HPI:   Alexandra Martinez a 80 y.o. female who presents Follow up on chronic complaint of gastritis despite regular use of protonix.  She was started on qid carafate 2 weeks ago by RR.  Use of carafate has helped with the burning feeling,  But still having indigestion, which she describes as an uncomfortable feeling in stomach after every  Meal accompanied by nausea without emesis.  Not having diarrhea on a regular basis and has had a positive weight change over the past 2 years.  Had a postivie gastroparesis study in 2010 at [facility] .  She has been receiving epidural infections by Dr. Loistine Chance with mixed results.    Past Medical History  Diagnosis Date  . Hypothyroidism   . Osteoporosis   . Recurrent UTI   . Hydronephrosis     uretero- with congenital UPJ obstruction- [name]  . GERD (gastroesophageal reflux disease)   . DDD (degenerative disc disease)   . Atrial fib/flutter, transient     [facility] admission for RVR 03/2009  . Hydronephrosis with ureteropelvic junction obstruction   . Hiatal hernia with gastroesophageal reflux 2008    by EGD,  [name]    Past Surgical History  Procedure Laterality Date  .  Cataract extraction      left  . Cholecystectomy    . Cervical disc surgery  1980's  . Thyroid lobectomy      partial, right lobe  . Total abdominal hysterectomy w/ bilateral salpingoophorectomy         The following portions of the patient's history were reviewed and updated as appropriate: Allergies, current medications, and problem list.    Review of Systems:   12 Pt  review of systems was negative except those addressed in the HPI,     History   Social History  . Marital Status: Widowed    Spouse Name: N/A    Number of Children: N/A  . Years of Education: N/A   Occupational History  . Not on file.   Social History Main Topics  . Smoking status: Never Smoker   . Smokeless tobacco: Never Used  . Alcohol Use: No  . Drug Use: No  . Sexual Activity: Not on file   Other Topics Concern  . Not on file   Social History Narrative   Lives alone, widowed in 2005.   Husband was a smoker.    Objective:  Filed Vitals:   05/14/13 0920  BP: 118/68  Pulse: 56  Temp: 97.9 F (36.6 C)  Resp: 12     General appearance: alert, cooperative and appears stated age Ears: normal TM's and external ear canals both ears Throat: lips, mucosa, and tongue  normal; teeth and gums normal Neck: no adenopathy, no carotid bruit, supple, symmetrical, trachea midline and thyroid not enlarged, symmetric, no tenderness/mass/nodules Back: symmetric, no curvature. ROM normal. No CVA tenderness. Lungs: clear to auscultation bilaterally Heart: regular rate and rhythm, S1, S2 normal, no murmur, click, rub or gallop Abdomen: soft, non-tender; bowel sounds normal; no masses,  no organomegaly Pulses: 2+ and symmetric Skin: Skin color, texture, turgor normal. No rashes or lesions Lymph nodes: Cervical, supraclavicular, and axillary nodes normal.  Assessment and Plan:  Burning sensation of stomach Improved with use of her feet. Her symptoms appear to be due to irritable bowel chronic  gastritis. She is a comprehensive GI workup in the past which noted a hiatal hernia gastroesophageal reflux and gastroparesis. Trial of Zofran for nausea to replace the morning dose of Carafate. Continue Protonix.  Chronic lower back pain Now under treatment by Dr. Loistine Chance over the night the is with epidural steroid injections.  Controlled atrial fibrillation Well controlled on current regimen. , no changes today.  Long term (current) use of anticoagulants Now managed with as a result of instead of Coumadin. She has had no excessive bleeding headaches or bruising.  Pruritic condition Currently quiescent as she is now more focused on her gastroparesis and gastritis.  A total of 30 minutes was spent with patient more than half of which was spent in counseling, reviewing records from other prviders and coordination of care.   Updated Medication List Outpatient Encounter Prescriptions as of 05/14/2013  Medication Sig  . ammonium lactate (AMLACTIN) 12 % cream Apply 1 g topically 2 (two) times daily.  Marland Kitchen levothyroxine (SYNTHROID, LEVOTHROID) 50 MCG tablet TAKE 1 TABLET BY MOUTH EVERY DAY  . loratadine (CLARITIN) 10 MG tablet TAKE 1 TABLET BY MOUTH EVERY DAY  . metoprolol (LOPRESSOR) 100 MG tablet TAKE 1 TABLET BY MOUTH TWICE DAILY  . pantoprazole (PROTONIX) 40 MG tablet Take 1 tablet (40 mg total) by mouth daily.  . Rivaroxaban (XARELTO) 15 MG TABS tablet Take 15 mg by mouth daily.  . sucralfate (CARAFATE) 1 G tablet Take 1 tablet 1 hour before meals and at bedtime.  . traMADol (ULTRAM) 50 MG tablet Take 1 tablet (50 mg total) by mouth every 8 (eight) hours as needed for pain.  Marland Kitchen ondansetron (ZOFRAN) 4 MG tablet Take 1 tablet (4 mg total) by mouth every 8 (eight) hours as needed for nausea or vomiting.     Orders Placed This Encounter  Procedures  . Rubeola Antibody, IGM    No Follow-up on file.

## 2013-05-14 NOTE — Assessment & Plan Note (Signed)
Currently quiescent as she is now more focused on her gastroparesis and gastritis.

## 2013-05-14 NOTE — Assessment & Plan Note (Signed)
Now managed with as a result of instead of Coumadin. She has had no excessive bleeding headaches or bruising.

## 2013-05-16 LAB — RUBEOLA ANTIBODY, IGM: Measles Ab IgM, IFA: <1:20 {titer}

## 2013-05-22 DIAGNOSIS — IMO0002 Reserved for concepts with insufficient information to code with codable children: Secondary | ICD-10-CM | POA: Diagnosis not present

## 2013-05-22 DIAGNOSIS — M5137 Other intervertebral disc degeneration, lumbosacral region: Secondary | ICD-10-CM | POA: Diagnosis not present

## 2013-05-22 DIAGNOSIS — M47817 Spondylosis without myelopathy or radiculopathy, lumbosacral region: Secondary | ICD-10-CM | POA: Diagnosis not present

## 2013-06-26 DIAGNOSIS — M5137 Other intervertebral disc degeneration, lumbosacral region: Secondary | ICD-10-CM | POA: Diagnosis not present

## 2013-06-26 DIAGNOSIS — IMO0002 Reserved for concepts with insufficient information to code with codable children: Secondary | ICD-10-CM | POA: Diagnosis not present

## 2013-07-03 DIAGNOSIS — H1045 Other chronic allergic conjunctivitis: Secondary | ICD-10-CM | POA: Diagnosis not present

## 2013-07-05 ENCOUNTER — Encounter: Payer: Self-pay | Admitting: Internal Medicine

## 2013-07-05 ENCOUNTER — Ambulatory Visit (INDEPENDENT_AMBULATORY_CARE_PROVIDER_SITE_OTHER): Payer: Medicare Other | Admitting: Internal Medicine

## 2013-07-05 VITALS — BP 138/76 | HR 72 | Temp 97.4°F | Resp 18 | Wt 163.0 lb

## 2013-07-05 DIAGNOSIS — E039 Hypothyroidism, unspecified: Secondary | ICD-10-CM

## 2013-07-05 DIAGNOSIS — R5381 Other malaise: Secondary | ICD-10-CM | POA: Diagnosis not present

## 2013-07-05 DIAGNOSIS — Z7901 Long term (current) use of anticoagulants: Secondary | ICD-10-CM

## 2013-07-05 DIAGNOSIS — R5383 Other fatigue: Secondary | ICD-10-CM | POA: Diagnosis not present

## 2013-07-05 DIAGNOSIS — E785 Hyperlipidemia, unspecified: Secondary | ICD-10-CM | POA: Diagnosis not present

## 2013-07-05 DIAGNOSIS — K589 Irritable bowel syndrome without diarrhea: Secondary | ICD-10-CM

## 2013-07-05 DIAGNOSIS — D62 Acute posthemorrhagic anemia: Secondary | ICD-10-CM | POA: Diagnosis not present

## 2013-07-05 DIAGNOSIS — G8929 Other chronic pain: Secondary | ICD-10-CM

## 2013-07-05 DIAGNOSIS — M545 Low back pain, unspecified: Secondary | ICD-10-CM

## 2013-07-05 NOTE — Progress Notes (Signed)
Pre-visit discussion using our clinic review tool. No additional management support is needed unless otherwise documented below in the visit note.  

## 2013-07-05 NOTE — Progress Notes (Signed)
[name], female   DOB: [birth year], 80 y.o.   MRN: [id number]  Patient Active Problem List   Diagnosis Date Noted  . Other malaise and fatigue 07/07/2013  . Burning sensation of stomach 04/25/2013  . Dysuria 12/22/2012  . Pruritic condition 12/03/2012  . Anemia 09/10/2012  . Dyspnea on exertion 08/12/2012  . Chronic lower back pain 08/12/2012  . Urge urinary incontinence 08/12/2012  . Personal history of colonic polyps 08/12/2012  . Seasonal rhinitis 01/16/2012  . Hiatal hernia with gastroesophageal reflux   . Controlled atrial fibrillation 12/15/2010  . Irritable bowel syndrome without diarrhea 12/15/2010  . Hypothyroid 12/15/2010  . Long term (current) use of anticoagulants 12/15/2010    Subjective:  CC:   Chief Complaint  Patient presents with  . Follow-up    OV from 05/14/13    HPI:   Alexandra Martinez is a 80 y.o. female who presents for  Follow up on chronic complaints, including abdominal pain, back pain and fatigue.     She has a new skin tear on the right forearm which occurred two days ago, which is still oozing.   Her back pain has improved somewhat after treatment by Dr. Loistine Chance, which included and epidural injection,  But she does not want to have any more injections. An dhas been referred for PT   The Zofran is helping somewhat with the post prandial nausea.  She has been taking it before she eats and sometimes qhs,  using it prn.  She continues to report daily fatigue that occurs night and day.  She denies chest pain and dyspnea.  She does not exercise regularly or even walk for 15 minutes daily.      Past Medical History  Diagnosis Date  . Hypothyroidism   . Osteoporosis   . Recurrent UTI   . Hydronephrosis     uretero- with congenital UPJ obstruction- [name]  . GERD (gastroesophageal reflux disease)   . DDD (degenerative disc disease)   . Atrial fib/flutter, transient     [facility] admission for RVR 03/2009  . Hydronephrosis with  ureteropelvic junction obstruction   . Hiatal hernia with gastroesophageal reflux 2008    by EGD,  [name]    Past Surgical History  Procedure Laterality Date  . Cataract extraction      left  . Cholecystectomy    . Cervical disc surgery  1980's  . Thyroid lobectomy      partial, right lobe  . Total abdominal hysterectomy w/ bilateral salpingoophorectomy         The following portions of the patient's history were reviewed and updated as appropriate: Allergies, current medications, and problem list.    Review of Systems:   Patient denies headache, fevers, malaise, unintentional weight loss, skin rash, eye pain, sinus congestion and sinus pain, sore throat, dysphagia,  hemoptysis , cough, dyspnea, wheezing, chest pain, palpitations, orthopnea, edema, abdominal pain, nausea, melena, diarrhea, constipation, flank pain, dysuria, hematuria, urinary  Frequency, nocturia, numbness, tingling, seizures,  Focal weakness, Loss of consciousness,  Tremor, insomnia, depression, anxiety, and suicidal ideation.     History   Social History  . Marital Status: Widowed    Spouse Name: N/A    Number of Children: N/A  . Years of Education: N/A   Occupational History  . Not on file.   Social History Main Topics  . Smoking status: Never Smoker   . Smokeless tobacco: Never Used  . Alcohol Use: No  . Drug Use:  No  . Sexual Activity: No   Other Topics Concern  . Not on file   Social History Narrative   Lives alone, widowed in 2005.   Husband was a smoker.    Objective:  Filed Vitals:   07/05/13 0959  BP: 138/76  Pulse: 72  Temp: 97.4 F (36.3 C)  Resp: 18     General appearance: alert, cooperative and appears stated age Ears: normal TM's and external ear canals both ears Throat: lips, mucosa, and tongue normal; teeth and gums normal Neck: no adenopathy, no carotid bruit, supple, symmetrical, trachea midline and thyroid not enlarged, symmetric, no  tenderness/mass/nodules Back: symmetric, no curvature. ROM normal. No CVA tenderness. Lungs: clear to auscultation bilaterally Heart: regular rate and rhythm, S1, S2 normal, no murmur, click, rub or gallop Abdomen: soft, non-tender; bowel sounds normal; no masses,  no organomegaly Pulses: 2+ and symmetric Skin: Skin color, texture, turgor normal. No rashes or lesions Lymph nodes: Cervical, supraclavicular, and axillary nodes normal.  Assessment and Plan:  Irritable bowel syndrome without diarrhea Somewhat imporved with addition of zofran for nausea.  No changes today  Hypothyroid Thyroid function has not veen checked since last May .  She will return for fasting lipids and thyroid   Lab Results  Component Value Date   TSH 1.926 09/08/2012     Long term (current) use of anticoagulants Now managed with Xarelto  instead of Coumadin.     Chronic lower back pain Prior CT of cervical and lumbar spine with myelography in 2007 by Dr. Joya Salm showed C5 nerve root compression but no significant spinal stenosis in the lumbar area, just mild disk space narrowing L4-L5.  She has no radicular symptoms currently and has had no recent falls. Improved with treatment by Dr, Sharlet Salina, PT referral is pending   Other malaise and fatigue Checking thyroid, CBC .  Advised to start a walking program to increased endurance    Updated Medication List Outpatient Encounter Prescriptions as of 07/05/2013  Medication Sig  . ammonium lactate (AMLACTIN) 12 % cream Apply 1 g topically 2 (two) times daily.  Marland Kitchen levothyroxine (SYNTHROID, LEVOTHROID) 50 MCG tablet TAKE 1 TABLET BY MOUTH EVERY DAY  . loratadine (CLARITIN) 10 MG tablet TAKE 1 TABLET BY MOUTH EVERY DAY  . metoprolol (LOPRESSOR) 100 MG tablet TAKE 1 TABLET BY MOUTH TWICE DAILY  . ondansetron (ZOFRAN) 4 MG tablet Take 1 tablet (4 mg total) by mouth every 8 (eight) hours as needed for nausea or vomiting.  . pantoprazole (PROTONIX) 40 MG tablet Take 1  tablet (40 mg total) by mouth daily.  . Rivaroxaban (XARELTO) 15 MG TABS tablet Take 15 mg by mouth daily.  . sucralfate (CARAFATE) 1 G tablet Take 1 tablet 1 hour before meals and at bedtime.  . traMADol (ULTRAM) 50 MG tablet Take 1 tablet (50 mg total) by mouth every 8 (eight) hours as needed for pain.     Orders Placed This Encounter  Procedures  . Comprehensive metabolic panel  . Lipid panel  . TSH  . CBC with Differential    No Follow-up on file.

## 2013-07-05 NOTE — Patient Instructions (Addendum)
The next time you feel overwhelmingly tired.  Check HR and BP  If HR is < 60 or > 100 this may be the cause   If BP < 100./60,  This may be the cause   I want you to start going for a 10 minute walk EVERY DAY  Increase it to 15 minutes after 1 week  Work you way up to 30 minutes by increasing every week by 5 minutes  Eat a low glycemic index meal when you eat out

## 2013-07-07 ENCOUNTER — Encounter: Payer: Self-pay | Admitting: Internal Medicine

## 2013-07-07 DIAGNOSIS — R5383 Other fatigue: Secondary | ICD-10-CM | POA: Insufficient documentation

## 2013-07-07 NOTE — Assessment & Plan Note (Signed)
Checking thyroid, CBC .  Advised to start a walking program to increased endurance

## 2013-07-07 NOTE — Assessment & Plan Note (Signed)
Now managed with Xarelto  instead of Coumadin.

## 2013-07-07 NOTE — Assessment & Plan Note (Signed)
Somewhat imporved with addition of zofran for nausea.  No changes today

## 2013-07-07 NOTE — Assessment & Plan Note (Signed)
Thyroid function has not veen checked since last May .  She will return for fasting lipids and thyroid   Lab Results  Component Value Date   TSH 1.926 09/08/2012

## 2013-07-07 NOTE — Assessment & Plan Note (Signed)
Prior CT of cervical and lumbar spine with myelography in 2007 by Dr. Joya Salm showed C5 nerve root compression but no significant spinal stenosis in the lumbar area, just mild disk space narrowing L4-L5.  She has no radicular symptoms currently and has had no recent falls. Improved with treatment by Dr, Sharlet Salina, PT referral is pending

## 2013-07-11 ENCOUNTER — Other Ambulatory Visit: Payer: Self-pay | Admitting: Internal Medicine

## 2013-07-11 ENCOUNTER — Telehealth: Payer: Self-pay | Admitting: Internal Medicine

## 2013-07-11 DIAGNOSIS — Z1239 Encounter for other screening for malignant neoplasm of breast: Secondary | ICD-10-CM

## 2013-07-11 NOTE — Telephone Encounter (Signed)
Pt called stating she received a letter [facility] stating it was time for her mammogram.  She needs to get a referral from dr Derrel Nip to get her mammogram

## 2013-07-13 NOTE — Telephone Encounter (Signed)
Referral is in process as requested 

## 2013-07-13 NOTE — Telephone Encounter (Signed)
Tullo, please place order for mammo

## 2013-07-16 ENCOUNTER — Other Ambulatory Visit (INDEPENDENT_AMBULATORY_CARE_PROVIDER_SITE_OTHER): Payer: Medicare Other

## 2013-07-16 DIAGNOSIS — E785 Hyperlipidemia, unspecified: Secondary | ICD-10-CM | POA: Diagnosis not present

## 2013-07-16 DIAGNOSIS — D62 Acute posthemorrhagic anemia: Secondary | ICD-10-CM

## 2013-07-16 DIAGNOSIS — R5383 Other fatigue: Secondary | ICD-10-CM | POA: Diagnosis not present

## 2013-07-16 DIAGNOSIS — I4891 Unspecified atrial fibrillation: Secondary | ICD-10-CM | POA: Diagnosis not present

## 2013-07-16 DIAGNOSIS — R5381 Other malaise: Secondary | ICD-10-CM | POA: Diagnosis not present

## 2013-07-16 LAB — LIPID PANEL
Cholesterol: 217 mg/dL — ABNORMAL HIGH (ref 0–200)
HDL: 41.7 mg/dL (ref >39.00)
LDL Cholesterol: 158 mg/dL — ABNORMAL HIGH (ref 0–99)
Total CHOL/HDL Ratio: 5
Triglycerides: 88 mg/dL (ref 0.0–149.0)
VLDL: 17.6 mg/dL (ref 0.0–40.0)

## 2013-07-16 LAB — COMPREHENSIVE METABOLIC PANEL
ALT: 10 U/L (ref 0–35)
AST: 19 U/L (ref 0–37)
Albumin: 3.9 g/dL (ref 3.5–5.2)
Alkaline Phosphatase: 78 U/L (ref 39–117)
BUN: 22 mg/dL (ref 6–23)
CO2: 27 mEq/L (ref 19–32)
Calcium: 9 mg/dL (ref 8.4–10.5)
Chloride: 105 mEq/L (ref 96–112)
Creatinine, Ser: 1.2 mg/dL (ref 0.4–1.2)
GFR: 46.24 mL/min — ABNORMAL LOW (ref >60.00)
Glucose, Bld: 100 mg/dL — ABNORMAL HIGH (ref 70–99)
Potassium: 4.5 mEq/L (ref 3.5–5.1)
Sodium: 138 mEq/L (ref 135–145)
Total Bilirubin: 0.7 mg/dL (ref 0.3–1.2)
Total Protein: 6.9 g/dL (ref 6.0–8.3)

## 2013-07-16 LAB — TSH: TSH: 2.43 u[IU]/mL (ref 0.35–5.50)

## 2013-07-16 LAB — PROTIME-INR
INR: 1.7 ratio — ABNORMAL HIGH (ref 0.8–1.0)
Prothrombin Time: 17.4 s — ABNORMAL HIGH (ref 10.2–12.4)

## 2013-07-16 NOTE — Telephone Encounter (Signed)
Left message for patient to call office.  

## 2013-07-17 LAB — CBC WITH DIFFERENTIAL/PLATELET
Basophils Absolute: 0 10*3/uL (ref 0.0–0.1)
Basophils Relative: 0.5 % (ref 0.0–3.0)
Eosinophils Absolute: 0.2 10*3/uL (ref 0.0–0.7)
Eosinophils Relative: 3.5 % (ref 0.0–5.0)
HCT: 35.3 % — ABNORMAL LOW (ref 36.0–46.0)
Hemoglobin: 12 g/dL (ref 12.0–15.0)
Lymphocytes Relative: 25.8 % (ref 12.0–46.0)
Lymphs Abs: 1.4 10*3/uL (ref 0.7–4.0)
MCHC: 33.9 g/dL (ref 30.0–36.0)
MCV: 91 fl (ref 78.0–100.0)
Monocytes Absolute: 0.4 10*3/uL (ref 0.1–1.0)
Monocytes Relative: 7.2 % (ref 3.0–12.0)
Neutro Abs: 3.4 10*3/uL (ref 1.4–7.7)
Neutrophils Relative %: 63 % (ref 43.0–77.0)
Platelets: 158 10*3/uL (ref 150.0–400.0)
RBC: 3.88 Mil/uL (ref 3.87–5.11)
RDW: 13.2 % (ref 11.5–14.6)
WBC: 5.4 10*3/uL (ref 4.5–10.5)

## 2013-07-17 NOTE — Telephone Encounter (Signed)
Patient notified

## 2013-07-18 ENCOUNTER — Encounter: Payer: Self-pay | Admitting: Physical Medicine and Rehabilitation

## 2013-07-18 DIAGNOSIS — M6281 Muscle weakness (generalized): Secondary | ICD-10-CM | POA: Diagnosis not present

## 2013-07-18 DIAGNOSIS — IMO0001 Reserved for inherently not codable concepts without codable children: Secondary | ICD-10-CM | POA: Diagnosis not present

## 2013-07-18 DIAGNOSIS — M545 Low back pain, unspecified: Secondary | ICD-10-CM | POA: Diagnosis not present

## 2013-07-30 DIAGNOSIS — I519 Heart disease, unspecified: Secondary | ICD-10-CM | POA: Diagnosis not present

## 2013-07-30 DIAGNOSIS — I059 Rheumatic mitral valve disease, unspecified: Secondary | ICD-10-CM | POA: Diagnosis not present

## 2013-07-30 DIAGNOSIS — R0602 Shortness of breath: Secondary | ICD-10-CM | POA: Diagnosis not present

## 2013-07-30 DIAGNOSIS — E782 Mixed hyperlipidemia: Secondary | ICD-10-CM | POA: Diagnosis not present

## 2013-08-02 ENCOUNTER — Encounter: Payer: Self-pay | Admitting: Adult Health

## 2013-08-02 ENCOUNTER — Ambulatory Visit (INDEPENDENT_AMBULATORY_CARE_PROVIDER_SITE_OTHER): Payer: Medicare Other | Admitting: Adult Health

## 2013-08-02 VITALS — BP 122/82 | HR 88 | Temp 98.1°F | Resp 14 | Wt 158.0 lb

## 2013-08-02 DIAGNOSIS — J029 Acute pharyngitis, unspecified: Secondary | ICD-10-CM | POA: Insufficient documentation

## 2013-08-02 DIAGNOSIS — J329 Chronic sinusitis, unspecified: Secondary | ICD-10-CM

## 2013-08-02 LAB — POCT RAPID STREP A (OFFICE): Rapid Strep A Screen: NEGATIVE

## 2013-08-02 MED ORDER — BENZONATATE 100 MG PO CAPS: 100.0000 mg | ORAL_CAPSULE | Freq: Two times a day (BID) | ORAL | Status: DC | PRN

## 2013-08-02 MED ORDER — AMOXICILLIN-POT CLAVULANATE 875-125 MG PO TABS: 1.0000 | ORAL_TABLET | Freq: Two times a day (BID) | ORAL | Status: DC

## 2013-08-02 NOTE — Patient Instructions (Signed)
  Start Augmentin twice a day for 7 days.  Tessalon as directed for cough.

## 2013-08-02 NOTE — Progress Notes (Signed)
Pre visit review using our clinic review tool, if applicable. No additional management support is needed unless otherwise documented below in the visit note. 

## 2013-08-02 NOTE — Progress Notes (Signed)
   Subjective:    [name], female    DOB: 1929/10/10, 80 y.o.   MRN: [id number]  HPI Patient is a pleasant 80 year old female who presents to clinic with sinus congestion, cough, sneezing, headache, sore throat. She denies fever or chills. Denies shortness of breath. Symptoms have been ongoing since last weekend. Patient has been taking Claritin without improvement of her symptoms.  Current Outpatient Prescriptions on File Prior to Visit  Medication Sig Dispense Refill  . ammonium lactate (AMLACTIN) 12 % cream Apply 1 g topically 2 (two) times daily.      Marland Kitchen esomeprazole (NEXIUM) 40 MG capsule TAKE 1 CAPSULE BY MOUTH EVERY DAY  30 capsule  5  . levothyroxine (SYNTHROID, LEVOTHROID) 50 MCG tablet TAKE 1 TABLET BY MOUTH EVERY DAY  30 tablet  9  . loratadine (CLARITIN) 10 MG tablet TAKE 1 TABLET BY MOUTH EVERY DAY  30 tablet  11  . metoprolol (LOPRESSOR) 100 MG tablet TAKE 1 TABLET BY MOUTH TWICE DAILY  180 tablet  1  . ondansetron (ZOFRAN) 4 MG tablet Take 1 tablet (4 mg total) by mouth every 8 (eight) hours as needed for nausea or vomiting.  30 tablet  0  . pantoprazole (PROTONIX) 40 MG tablet Take 1 tablet (40 mg total) by mouth daily.  40 tablet  3  . Rivaroxaban (XARELTO) 15 MG TABS tablet Take 15 mg by mouth daily.      . sucralfate (CARAFATE) 1 G tablet Take 1 tablet 1 hour before meals and at bedtime.  120 tablet  1  . traMADol (ULTRAM) 50 MG tablet Take 1 tablet (50 mg total) by mouth every 8 (eight) hours as needed for pain.  90 tablet  0   No current facility-administered medications on file prior to visit.      Review of Systems  Constitutional: Negative for fever and chills.  HENT: Positive for congestion, postnasal drip, rhinorrhea, sinus pressure, sneezing and sore throat.   Respiratory: Positive for cough. Negative for shortness of breath.   All other systems reviewed and are negative.      Objective:   Physical Exam  Constitutional: She is oriented  to person, place, and time. She appears well-developed and well-nourished. No distress.  HENT:  Head: Normocephalic and atraumatic.  Right Ear: External ear normal.  Left Ear: External ear normal.  Mouth/Throat: No oropharyngeal exudate.  Throat is significantly erythematous  Cardiovascular: Normal rate, regular rhythm and normal heart sounds.  Exam reveals no gallop.   No murmur heard. Pulmonary/Chest: Effort normal and breath sounds normal. No respiratory distress. She has no wheezes. She has no rales.  Lymphadenopathy:    She has no cervical adenopathy.  Neurological: She is alert and oriented to person, place, and time.  Psychiatric: She has a normal mood and affect. Her behavior is normal. Judgment and thought content normal.      Assessment & Plan:   1. Sore throat Negative for strep - POCT rapid strep A  2. Sinusitis Augmentin bid x 7 days Tessalon for cough RTC if no improvement in 4-5 days or sooner if necessary

## 2013-08-13 DIAGNOSIS — M47817 Spondylosis without myelopathy or radiculopathy, lumbosacral region: Secondary | ICD-10-CM | POA: Diagnosis not present

## 2013-08-13 DIAGNOSIS — M48062 Spinal stenosis, lumbar region with neurogenic claudication: Secondary | ICD-10-CM | POA: Diagnosis not present

## 2013-08-13 DIAGNOSIS — IMO0002 Reserved for concepts with insufficient information to code with codable children: Secondary | ICD-10-CM | POA: Diagnosis not present

## 2013-08-13 DIAGNOSIS — M5137 Other intervertebral disc degeneration, lumbosacral region: Secondary | ICD-10-CM | POA: Diagnosis not present

## 2013-08-17 ENCOUNTER — Encounter: Payer: Self-pay | Admitting: Physical Medicine and Rehabilitation

## 2013-08-17 DIAGNOSIS — R0602 Shortness of breath: Secondary | ICD-10-CM | POA: Diagnosis not present

## 2013-08-28 ENCOUNTER — Ambulatory Visit: Payer: Self-pay | Admitting: Internal Medicine

## 2013-08-28 DIAGNOSIS — Z1231 Encounter for screening mammogram for malignant neoplasm of breast: Secondary | ICD-10-CM | POA: Diagnosis not present

## 2013-08-31 DIAGNOSIS — N390 Urinary tract infection, site not specified: Secondary | ICD-10-CM | POA: Diagnosis not present

## 2013-09-09 ENCOUNTER — Other Ambulatory Visit: Payer: Self-pay | Admitting: Internal Medicine

## 2013-09-14 DIAGNOSIS — N390 Urinary tract infection, site not specified: Secondary | ICD-10-CM | POA: Diagnosis not present

## 2013-09-19 ENCOUNTER — Encounter: Payer: Self-pay | Admitting: Internal Medicine

## 2013-10-01 ENCOUNTER — Other Ambulatory Visit: Payer: Self-pay | Admitting: Internal Medicine

## 2013-10-02 ENCOUNTER — Encounter: Payer: Self-pay | Admitting: Internal Medicine

## 2013-10-02 ENCOUNTER — Ambulatory Visit (INDEPENDENT_AMBULATORY_CARE_PROVIDER_SITE_OTHER): Payer: Medicare Other | Admitting: Internal Medicine

## 2013-10-02 VITALS — BP 126/68 | HR 79 | Temp 97.6°F | Resp 16 | Ht 63.0 in | Wt 160.2 lb

## 2013-10-02 DIAGNOSIS — M545 Low back pain, unspecified: Secondary | ICD-10-CM

## 2013-10-02 DIAGNOSIS — R29898 Other symptoms and signs involving the musculoskeletal system: Secondary | ICD-10-CM

## 2013-10-02 DIAGNOSIS — G8929 Other chronic pain: Secondary | ICD-10-CM

## 2013-10-02 DIAGNOSIS — R300 Dysuria: Secondary | ICD-10-CM | POA: Diagnosis not present

## 2013-10-02 DIAGNOSIS — D649 Anemia, unspecified: Secondary | ICD-10-CM | POA: Diagnosis not present

## 2013-10-02 LAB — POCT URINALYSIS DIPSTICK
Bilirubin, UA: NEGATIVE
Glucose, UA: NEGATIVE
Ketones, UA: NEGATIVE
Nitrite, UA: NEGATIVE
Protein, UA: NEGATIVE
Spec Grav, UA: 1.01
Urobilinogen, UA: 0.2
pH, UA: 6

## 2013-10-02 MED ORDER — CRANBERRY 300 MG PO TABS: 300.0000 mg | ORAL_TABLET | Freq: Two times a day (BID) | ORAL | Status: DC

## 2013-10-02 NOTE — Progress Notes (Signed)
Pre-visit discussion using our clinic review tool. No additional management support is needed unless otherwise documented below in the visit note.  

## 2013-10-02 NOTE — Assessment & Plan Note (Signed)
Persistent and worsening.  Will repeat MRI lumbar spine and refer to Neurosurgery if MRI suggests spinal stenosis

## 2013-10-02 NOTE — Assessment & Plan Note (Addendum)
Unclear if recent episodes are due to bacterial infection or atrophic vaginitis. Awaiting culture before treating .

## 2013-10-02 NOTE — Progress Notes (Signed)
[name], female   DOB: April 30, 1929, 80 y.o.   MRN: [id number]    Patient Active Problem List   Diagnosis Date Noted  . Sore throat 08/02/2013  . Other malaise and fatigue 07/07/2013  . Burning sensation of stomach 04/25/2013  . Dysuria 12/22/2012  . Pruritic condition 12/03/2012  . Anemia 09/10/2012  . Dyspnea on exertion 08/12/2012  . Chronic lower back pain 08/12/2012  . Urge urinary incontinence 08/12/2012  . Personal history of colonic polyps 08/12/2012  . Seasonal rhinitis 01/16/2012  . Hiatal hernia with gastroesophageal reflux   . Controlled atrial fibrillation 12/15/2010  . Irritable bowel syndrome without diarrhea 12/15/2010  . Hypothyroid 12/15/2010  . Long term (current) use of anticoagulants 12/15/2010    Subjective:  CC:   Chief Complaint  Patient presents with  . Shoulder Pain    right shoulder pain X 5 days  . Hip Pain    radiates down leg has ,trouble walking up steps.  . Dysuria    Burning    HPI:   Alexandra Martinez is a 80 y.o. female who presents for Multiple complaints today  Recurrent episodes of dysuria.  Treated two weeks ago By [facility] with antibiotic,  Didn't help,  so she returned to [facility] and was treated with a different antibiotic.  Not sure if a culture was done.  Symptoms resolved for a few days , but now having intermittent episodes of mild burning.  .  Low back pain , persistent ,  Radiates to right knee and below.,  No improvement with epidural injections done by Dr Loistine Chance .  Feels the right leg is becoming weaker,and she is having  trouble ascending stairs due to weakness in hip flexor and inguinal pain.  Using tramadol for pain. .   Past Medical History  Diagnosis Date  . Hypothyroidism   . Osteoporosis   . Recurrent UTI   . Hydronephrosis     uretero- with congenital UPJ obstruction- [name]  . GERD (gastroesophageal reflux disease)   . DDD (degenerative disc disease)   . Atrial fib/flutter,  transient     [facility] admission for RVR 03/2009  . Hydronephrosis with ureteropelvic junction obstruction   . Hiatal hernia with gastroesophageal reflux 2008    by EGD,  [name]    Past Surgical History  Procedure Laterality Date  . Cataract extraction      left  . Cholecystectomy    . Cervical disc surgery  1980's  . Thyroid lobectomy      partial, right lobe  . Total abdominal hysterectomy w/ bilateral salpingoophorectomy         The following portions of the patient's history were reviewed and updated as appropriate: Allergies, current medications, and problem list.    Review of Systems:   Patient denies headache, fevers, malaise, unintentional weight loss, skin rash, eye pain, sinus congestion and sinus pain, sore throat, dysphagia,  hemoptysis , cough, dyspnea, wheezing, chest pain, palpitations, orthopnea, edema, abdominal pain, nausea, melena, diarrhea, constipation, flank pain, dysuria, hematuria, urinary  Frequency, nocturia, numbness, tingling, seizures,  Focal weakness, Loss of consciousness,  Tremor, insomnia, depression, anxiety, and suicidal ideation.     History   Social History  . Marital Status: Widowed    Spouse Name: N/A    Number of Children: N/A  . Years of Education: N/A   Occupational History  . Not on file.   Social History Main Topics  . Smoking status: Never Smoker   .  Smokeless tobacco: Never Used  . Alcohol Use: No  . Drug Use: No  . Sexual Activity: No   Other Topics Concern  . Not on file   Social History Narrative   Lives alone, widowed in 2005.   Husband was a smoker.    Objective:  Filed Vitals:   10/02/13 1534  BP: 126/68  Pulse: 79  Temp: 97.6 F (36.4 C)  Resp: 16     General appearance: alert, cooperative and appears stated age Ears: normal TM's and external ear canals both ears Throat: lips, mucosa, and tongue normal; teeth and gums normal Neck: no adenopathy, no carotid bruit, supple, symmetrical, trachea  midline and thyroid not enlarged, symmetric, no tenderness/mass/nodules Back: symmetric, no curvature. ROM normal. No CVA tenderness. Lungs: clear to auscultation bilaterally Heart: regular rate and rhythm, S1, S2 normal, no murmur, click, rub or gallop Abdomen: soft, non-tender; bowel sounds normal; no masses,  no organomegaly Pulses: 2+ and symmetric Skin: Skin color, texture, turgor normal. No rashes or lesions Lymph nodes: Cervical, supraclavicular, and axillary nodes normal.  Assessment and Plan:  Dysuria Unclear if recent episodes are due to bacterial infection or atrophic vaginitis. Awaiting culture before treating .   Chronic lower back pain Persistent and worsening.  Will repeat MRI lumbar spine and refer to Neurosurgery if MRI suggests spinal stenosis   Updated Medication List Outpatient Encounter Prescriptions as of 10/02/2013  Medication Sig  . ammonium lactate (AMLACTIN) 12 % cream Apply 1 g topically 2 (two) times daily.  Marland Kitchen esomeprazole (NEXIUM) 40 MG capsule TAKE 1 CAPSULE BY MOUTH EVERY DAY  . levothyroxine (SYNTHROID, LEVOTHROID) 50 MCG tablet TAKE 1 TABLET BY MOUTH EVERY DAY  . loratadine (CLARITIN) 10 MG tablet TAKE 1 TABLET BY MOUTH EVERY DAY  . metoprolol (LOPRESSOR) 100 MG tablet TAKE 1 TABLET BY MOUTH TWICE DAILY  . ondansetron (ZOFRAN) 4 MG tablet Take 1 tablet (4 mg total) by mouth every 8 (eight) hours as needed for nausea or vomiting.  . Rivaroxaban (XARELTO) 15 MG TABS tablet Take 15 mg by mouth daily.  . sucralfate (CARAFATE) 1 G tablet Take 1 tablet 1 hour before meals and at bedtime.  . traMADol (ULTRAM) 50 MG tablet Take 1 tablet (50 mg total) by mouth every 8 (eight) hours as needed for pain.  Marland Kitchen amoxicillin-clavulanate (AUGMENTIN) 875-125 MG per tablet Take 1 tablet by mouth 2 (two) times daily.  . benzonatate (TESSALON) 100 MG capsule Take 1 capsule (100 mg total) by mouth 2 (two) times daily as needed for cough.  . Cranberry 300 MG tablet Take 1  tablet (300 mg total) by mouth 2 (two) times daily.  . pantoprazole (PROTONIX) 40 MG tablet Take 1 tablet (40 mg total) by mouth daily.     Orders Placed This Encounter  Procedures  . Urine culture  . MR Lumbar Spine Wo Contrast  . POCT Urinalysis Dipstick    No Follow-up on file.

## 2013-10-02 NOTE — Patient Instructions (Addendum)
Please start taking cranberry  Tablets twice a day to prevent infection .  If your culture is positive for infectio nI will call you in an antibiotic   Look for a multivitamin  That helps "hair , skin and nails"  Which will have b complex,  Pantothenic acid, calcium  and vitamin D   I recommend getting the majority of your calcium and Vitamin D  through diet rather than supplements given the recent association of calcium supplements with increased coronary artery calcium scores  Unsweetened almond/coconut milk is a great low calorie low carb way to increase your dietary calcium and vitamin D.  Try the [employer]   Your back pain can be treated with a combination of one tramadol and two tylenol every 8 hours if needed  MRI Lumbar spine to be ordered

## 2013-10-04 ENCOUNTER — Telehealth: Payer: Self-pay | Admitting: Internal Medicine

## 2013-10-04 NOTE — Telephone Encounter (Signed)
Can  You send a forma ua with micro on this urine?

## 2013-10-05 ENCOUNTER — Encounter: Payer: Self-pay | Admitting: Internal Medicine

## 2013-10-05 NOTE — Telephone Encounter (Signed)
The urine is bad after 24 hours

## 2013-10-07 LAB — URINE CULTURE: Colony Count: 50000

## 2013-10-08 ENCOUNTER — Telehealth: Payer: Self-pay | Admitting: Internal Medicine

## 2013-10-08 ENCOUNTER — Ambulatory Visit: Payer: Medicare Other | Admitting: Internal Medicine

## 2013-10-08 NOTE — Telephone Encounter (Signed)
Patient called back and reported no problems with urination.  No need for lab.

## 2013-10-18 ENCOUNTER — Ambulatory Visit: Payer: Self-pay | Admitting: Internal Medicine

## 2013-10-18 DIAGNOSIS — M4310 Spondylolisthesis, site unspecified: Secondary | ICD-10-CM | POA: Diagnosis not present

## 2013-10-18 DIAGNOSIS — M47817 Spondylosis without myelopathy or radiculopathy, lumbosacral region: Secondary | ICD-10-CM | POA: Diagnosis not present

## 2013-10-18 DIAGNOSIS — M5137 Other intervertebral disc degeneration, lumbosacral region: Secondary | ICD-10-CM | POA: Diagnosis not present

## 2013-10-18 DIAGNOSIS — G8929 Other chronic pain: Secondary | ICD-10-CM | POA: Diagnosis not present

## 2013-10-24 ENCOUNTER — Telehealth: Payer: Self-pay | Admitting: Internal Medicine

## 2013-10-24 DIAGNOSIS — G8929 Other chronic pain: Secondary | ICD-10-CM

## 2013-10-24 DIAGNOSIS — M545 Low back pain: Principal | ICD-10-CM

## 2013-10-24 NOTE — Assessment & Plan Note (Signed)
MRI of lumbar spine done July 2015 at [facility] severe degenerative changes but no spinal stenosis.  The arthritic changes at the L4 level may potentially be irritating the nerve root on the right side but it is not definite. This would cause pain radiating down the right leg but not weakness

## 2013-10-24 NOTE — Telephone Encounter (Signed)
MRI of lumbar spine showed severe degenerative changes but no spinal stenosis.  The arthritic changes at the L4 level may potentially be irritating the nerve root on the right side but it is not definite. This would cause pain radiating down the right leg but not weakness

## 2013-10-26 NOTE — Telephone Encounter (Signed)
Patient notified and voiced understanding read back to patient AVS instruction from most recent visit.

## 2013-10-26 NOTE — Telephone Encounter (Signed)
Left message for patient to return call to office. 

## 2013-11-05 ENCOUNTER — Encounter: Payer: Self-pay | Admitting: Internal Medicine

## 2013-11-07 ENCOUNTER — Telehealth: Payer: Self-pay | Admitting: *Deleted

## 2013-11-07 DIAGNOSIS — M5432 Sciatica, left side: Secondary | ICD-10-CM

## 2013-11-07 DIAGNOSIS — M545 Low back pain, unspecified: Secondary | ICD-10-CM

## 2013-11-07 DIAGNOSIS — G8929 Other chronic pain: Secondary | ICD-10-CM

## 2013-11-07 NOTE — Telephone Encounter (Signed)
Pt called states she is still having severe back pain. Pt also states she is taking the medication as directed with no resolve.  She is requesting a referral.  Please advise

## 2013-11-07 NOTE — Telephone Encounter (Signed)
Referral is in process as requested to Neurosurgery

## 2013-11-07 NOTE — Assessment & Plan Note (Signed)
MRI of lumbar spine done July 2015 at [facility] severe degenerative changes but no spinal stenosis.  The arthritic changes at the L4 level may potentially be irritating the nerve root on the right side but it is not definite. This would cause pain radiating down the right leg but not weakness

## 2013-11-07 NOTE — Telephone Encounter (Signed)
Spoke with pt advised of MDs message 

## 2013-11-19 ENCOUNTER — Telehealth: Payer: Self-pay | Admitting: Internal Medicine

## 2013-11-19 NOTE — Telephone Encounter (Signed)
Patient called concerned she has not heard anything on Neurosurgical referral please advise.

## 2013-12-03 DIAGNOSIS — M545 Low back pain, unspecified: Secondary | ICD-10-CM | POA: Diagnosis not present

## 2013-12-03 DIAGNOSIS — M4310 Spondylolisthesis, site unspecified: Secondary | ICD-10-CM | POA: Diagnosis not present

## 2013-12-03 DIAGNOSIS — I10 Essential (primary) hypertension: Secondary | ICD-10-CM | POA: Diagnosis not present

## 2013-12-03 DIAGNOSIS — Z6827 Body mass index (BMI) 27.0-27.9, adult: Secondary | ICD-10-CM | POA: Diagnosis not present

## 2013-12-11 ENCOUNTER — Other Ambulatory Visit: Payer: Self-pay | Admitting: Neurosurgery

## 2013-12-11 DIAGNOSIS — M4310 Spondylolisthesis, site unspecified: Secondary | ICD-10-CM

## 2013-12-18 ENCOUNTER — Ambulatory Visit
Admission: RE | Admit: 2013-12-18 | Discharge: 2013-12-18 | Disposition: A | Discharge status: Home / Self Care | Payer: Medicare Other | Source: Ambulatory Visit | Attending: Neurosurgery | Admitting: Neurosurgery

## 2013-12-18 VITALS — BP 145/78 | HR 84

## 2013-12-18 DIAGNOSIS — M5126 Other intervertebral disc displacement, lumbar region: Secondary | ICD-10-CM | POA: Diagnosis not present

## 2013-12-18 DIAGNOSIS — M4310 Spondylolisthesis, site unspecified: Secondary | ICD-10-CM | POA: Diagnosis not present

## 2013-12-18 DIAGNOSIS — M545 Low back pain, unspecified: Secondary | ICD-10-CM | POA: Diagnosis not present

## 2013-12-18 DIAGNOSIS — G8929 Other chronic pain: Secondary | ICD-10-CM

## 2013-12-18 MED ORDER — IOHEXOL 180 MG/ML  SOLN: 15.0000 mL | Freq: Once | INTRAMUSCULAR | Status: AC | PRN

## 2013-12-18 MED ORDER — DIAZEPAM 5 MG PO TABS
5.0000 mg | ORAL_TABLET | Freq: Once | ORAL | Status: AC
  Administered 2013-12-18: 5 mg via ORAL

## 2013-12-18 NOTE — Discharge Instructions (Signed)
Myelogram Discharge Instructions  1. Go home and rest quietly for the next 24 hours.  It is important to lie flat for the next 24 hours.  Get up only to go to the restroom.  You may lie in the bed or on a couch on your back, your stomach, your left side or your right side.  You may have one pillow under your head.  You may have pillows between your knees while you are on your side or under your knees while you are on your back.  2. DO NOT drive today.  Recline the seat as far back as it will go, while still wearing your seat belt, on the way home.  3. You may get up to go to the bathroom as needed.  You may sit up for 10 minutes to eat.  You may resume your normal diet and medications unless otherwise indicated.  Drink plenty of extra fluids today and tomorrow.  4. The incidence of a spinal headache with nausea and/or vomiting is about 5% (one in 20 patients).  If you develop a headache, lie flat and drink plenty of fluids until the headache goes away.  Caffeinated beverages may be helpful.  If you develop severe nausea and vomiting or a headache that does not go away with flat bed rest, call [phone].  5. You may resume normal activities after your 24 hours of bed rest is over; however, do not exert yourself strongly or do any heavy lifting tomorrow.  6. Call your physician for a follow-up appointment.   You may resume Xarelto today.  You may resume Tramadol on Wednesday, December 19, 2013 after 9:30a.m.

## 2013-12-18 NOTE — Progress Notes (Signed)
Patient states she has been off Tramadol and Xarelto for the past two days.  Brita Romp, RN

## 2013-12-19 DIAGNOSIS — M4310 Spondylolisthesis, site unspecified: Secondary | ICD-10-CM | POA: Diagnosis not present

## 2013-12-19 DIAGNOSIS — I10 Essential (primary) hypertension: Secondary | ICD-10-CM | POA: Diagnosis not present

## 2013-12-19 DIAGNOSIS — Z6827 Body mass index (BMI) 27.0-27.9, adult: Secondary | ICD-10-CM | POA: Diagnosis not present

## 2013-12-25 DIAGNOSIS — I38 Endocarditis, valve unspecified: Secondary | ICD-10-CM | POA: Diagnosis not present

## 2013-12-25 DIAGNOSIS — I4891 Unspecified atrial fibrillation: Secondary | ICD-10-CM | POA: Diagnosis not present

## 2013-12-25 DIAGNOSIS — I428 Other cardiomyopathies: Secondary | ICD-10-CM | POA: Diagnosis not present

## 2013-12-25 DIAGNOSIS — R0602 Shortness of breath: Secondary | ICD-10-CM | POA: Diagnosis not present

## 2013-12-28 ENCOUNTER — Other Ambulatory Visit: Payer: Self-pay | Admitting: Neurosurgery

## 2014-01-01 DIAGNOSIS — H18599 Other hereditary corneal dystrophies, unspecified eye: Secondary | ICD-10-CM | POA: Diagnosis not present

## 2014-01-01 DIAGNOSIS — H26499 Other secondary cataract, unspecified eye: Secondary | ICD-10-CM | POA: Diagnosis not present

## 2014-01-02 ENCOUNTER — Other Ambulatory Visit: Payer: Self-pay | Admitting: Internal Medicine

## 2014-01-03 ENCOUNTER — Ambulatory Visit (HOSPITAL_COMMUNITY)
Admission: RE | Admit: 2014-01-03 | Discharge: 2014-01-03 | Disposition: A | Discharge status: Home / Self Care | Payer: Medicare Other | Source: Ambulatory Visit | Attending: Anesthesiology | Admitting: Anesthesiology

## 2014-01-03 ENCOUNTER — Encounter (HOSPITAL_COMMUNITY)
Admission: RE | Admit: 2014-01-03 | Discharge: 2014-01-03 | Disposition: A | Discharge status: Home / Self Care | Payer: Medicare Other | Source: Ambulatory Visit | Attending: Neurosurgery | Admitting: Neurosurgery

## 2014-01-03 ENCOUNTER — Encounter (HOSPITAL_COMMUNITY): Payer: Self-pay

## 2014-01-03 ENCOUNTER — Encounter (HOSPITAL_COMMUNITY): Payer: Self-pay | Admitting: Pharmacy Technician

## 2014-01-03 ENCOUNTER — Telehealth: Payer: Self-pay | Admitting: Internal Medicine

## 2014-01-03 DIAGNOSIS — Z01818 Encounter for other preprocedural examination: Secondary | ICD-10-CM | POA: Insufficient documentation

## 2014-01-03 DIAGNOSIS — I700 Atherosclerosis of aorta: Secondary | ICD-10-CM | POA: Diagnosis not present

## 2014-01-03 DIAGNOSIS — I517 Cardiomegaly: Secondary | ICD-10-CM | POA: Diagnosis not present

## 2014-01-03 HISTORY — DX: Anemia, unspecified: D64.9

## 2014-01-03 HISTORY — DX: Cardiac murmur, unspecified: R01.1

## 2014-01-03 HISTORY — DX: Pure hypercholesterolemia, unspecified: E78.00

## 2014-01-03 HISTORY — DX: Essential (primary) hypertension: I10

## 2014-01-03 HISTORY — DX: Other polyuria: R35.8

## 2014-01-03 HISTORY — DX: Personal history of other benign neoplasm: Z86.018

## 2014-01-03 HISTORY — DX: Personal history of urinary (tract) infections: Z87.440

## 2014-01-03 HISTORY — DX: Nocturia: R35.1

## 2014-01-03 HISTORY — DX: Other polyuria: R35.89

## 2014-01-03 HISTORY — DX: Personal history of other diseases of the digestive system: Z87.19

## 2014-01-03 HISTORY — DX: Unspecified hemorrhoids: K64.9

## 2014-01-03 HISTORY — DX: Personal history of other endocrine, nutritional and metabolic disease: Z86.39

## 2014-01-03 LAB — SURGICAL PCR SCREEN
MRSA, PCR: POSITIVE — AB
Staphylococcus aureus: POSITIVE — AB

## 2014-01-03 LAB — BASIC METABOLIC PANEL
Anion gap: 11 (ref 5–15)
BUN: 13 mg/dL (ref 6–23)
CO2: 27 mEq/L (ref 19–32)
Calcium: 9.2 mg/dL (ref 8.4–10.5)
Chloride: 101 mEq/L (ref 96–112)
Creatinine, Ser: 1.04 mg/dL (ref 0.50–1.10)
GFR calc Af Amer: 57 mL/min — ABNORMAL LOW (ref >90)
GFR calc non Af Amer: 49 mL/min — ABNORMAL LOW (ref >90)
Glucose, Bld: 91 mg/dL (ref 70–99)
Potassium: 4.4 mEq/L (ref 3.7–5.3)
Sodium: 139 mEq/L (ref 137–147)

## 2014-01-03 LAB — CBC
HCT: 34.9 % — ABNORMAL LOW (ref 36.0–46.0)
Hemoglobin: 11.7 g/dL — ABNORMAL LOW (ref 12.0–15.0)
MCH: 30 pg (ref 26.0–34.0)
MCHC: 33.5 g/dL (ref 30.0–36.0)
MCV: 89.5 fL (ref 78.0–100.0)
Platelets: 169 10*3/uL (ref 150–400)
RBC: 3.9 MIL/uL (ref 3.87–5.11)
RDW: 12.5 % (ref 11.5–15.5)
WBC: 6.8 10*3/uL (ref 4.0–10.5)

## 2014-01-03 LAB — ABO/RH: ABO/RH(D): A POS

## 2014-01-03 MED ORDER — TRAMADOL HCL 50 MG PO TABS: 50.0000 mg | ORAL_TABLET | Freq: Three times a day (TID) | ORAL | Status: DC | PRN

## 2014-01-03 NOTE — Pre-Procedure Instructions (Signed)
Alexandra Martinez  01/03/2014   Your procedure is scheduled on:  September 23  Report to [facility] Admitting at 06:30 AM.  Call this number if you have problems the morning of surgery: [phone]   Remember:   Do not eat food or drink liquids after midnight.   Take these medicines the morning of surgery with A SIP OF WATER: Metoprolol, Loratadine, Nexium, Ondansetron (if needed), Levothyroxine, Tramadol (if needed)   STOP  Xarelto on 9/19   STOP Cranberry today   STOP/ Do not take Aspirin, Aleve, Naproxen, Advil, Ibuprofen, Motrin, Vitamins, Herbs, or Supplements starting today   Do not wear jewelry, make-up or nail polish.  Do not wear lotions, powders, or perfumes. You may wear deodorant.  Do not shave 48 hours prior to surgery. Men may shave face and neck.  Do not bring valuables to the hospital.  [facility] is not responsible for any belongings or valuables.               Contacts, dentures or bridgework may not be worn into surgery.  Leave suitcase in the car. After surgery it may be brought to your room.  For patients admitted to the hospital, discharge time is determined by your treatment team.               Special Instructions: [facility] - Preparing for Surgery  Before surgery, you can play an important role.  Because skin is not sterile, your skin needs to be as free of germs as possible.  You can reduce the number of germs on you skin by washing with CHG (chlorahexidine gluconate) soap before surgery.  CHG is an antiseptic cleaner which kills germs and bonds with the skin to continue killing germs even after washing.  Please DO NOT use if you have an allergy to CHG or antibacterial soaps.  If your skin becomes reddened/irritated stop using the CHG and inform your nurse when you arrive at Short Stay.  Do not shave (including legs and underarms) for at least 48 hours prior to the first CHG shower.  You may shave your face.  Please follow these instructions  carefully:   1.  Shower with CHG Soap the night before surgery and the morning of Surgery.  2.  If you choose to wash your hair, wash your hair first as usual with your normal shampoo.  3.  After you shampoo, rinse your hair and body thoroughly to remove the shampoo.  4.  Use CHG as you would any other liquid soap.  You can apply CHG directly to the skin and wash gently with scrungie or a clean washcloth.  5.  Apply the CHG Soap to your body ONLY FROM THE NECK DOWN.  Do not use on open wounds or open sores.  Avoid contact with your eyes, ears, mouth and genitals (private parts).  Wash genitals (private parts) with your normal soap.  6.  Wash thoroughly, paying special attention to the area where your surgery will be performed.  7.  Thoroughly rinse your body with warm water from the neck down.  8.  DO NOT shower/wash with your normal soap after using and rinsing off the CHG Soap.  9.  Pat yourself dry with a clean towel.            10.  Wear clean pajamas.            11.  Place clean sheets on your bed the night of your  first shower and do not sleep with pets.  Day of Surgery  Do not apply any lotions the morning of surgery.  Please wear clean clothes to the hospital/surgery center.     Please read over the following fact sheets that you were given: Pain Booklet, Coughing and Deep Breathing and Surgical Site Infection Prevention

## 2014-01-03 NOTE — Telephone Encounter (Signed)
Patient needs refill on tramadol, patient state that she uses prn for pain last fill was 5/14 ok to fill?

## 2014-01-03 NOTE — Telephone Encounter (Signed)
Dr Darien Ramus is doing back surgery Wednesday, at [facility]. FYI

## 2014-01-03 NOTE — Telephone Encounter (Signed)
Ok to refill,  printed rx  

## 2014-01-03 NOTE — Progress Notes (Signed)
01/03/14 1338  OBSTRUCTIVE SLEEP APNEA  Have you ever been diagnosed with sleep apnea through a sleep study? No  Do you snore loudly (loud enough to be heard through closed doors)?  0  Do you often feel tired, fatigued, or sleepy during the daytime? 1  Has anyone observed you stop breathing during your sleep? 1  Do you have, or are you being treated for high blood pressure? 1  BMI more than 35 kg/m2? 0  Age over 77 years old? 1  Neck circumference greater than 40 cm/16 inches? 0  Gender: 0  Obstructive Sleep Apnea Score 4  Score 4 or greater  Results sent to PCP

## 2014-01-03 NOTE — Progress Notes (Signed)
I called a prescription for Mupirocin ointment to [facility], [place]

## 2014-01-04 ENCOUNTER — Other Ambulatory Visit (HOSPITAL_COMMUNITY): Payer: Medicare Other

## 2014-01-04 ENCOUNTER — Encounter (HOSPITAL_COMMUNITY): Payer: Self-pay

## 2014-01-04 NOTE — Progress Notes (Signed)
Anesthesia chart review:  Patient is a 80 year old female scheduled for L2-3, L4-5 decompression/fusion, possible L3-4 on 01/09/14 by Dr. Joya Salm.  History includes non-smoker, paroxysmal afib/flutter (diagnosed '10), heart murmur (moderate MR/TR by 08/2013 echo), hypothyroidism, hydronephrosis with congenital UPJ obstruction (urologist is Dr. Edrick Oh in [place]), recurrent UTI, GERD, hiatal hernia, thyroid nodule s/p right partial thyroidectomy, anemia, hypercholesterolemia, anemia r/t uterine fibroid s/p hysterecomty, DDD, osteoporosis, cholecystectomy, c-spine surgery. OSA screening score was 4. PCP is Dr. Derrel Nip.  Cardiologist is Dr. [name] at [facility]). She was seen on 12/25/13 for follow-up and pre-operative evaluation.  According to his note, "The patient is at low is responsible for risk reduction in cardiovascular complications with surgical intervention and/or invasive procedure. Currently has no evidence active and/or significant angina and/or congestive heart failure. Proceed with surgery and/or invasive procedure without restriction. Patient may discontinue anticoagulation therapy 3 days prior to surgical intervention and or procedre for reduced bleeding risk..."   Echo on 08/17/13 [facility]) showed: Normal LV systolic function, EF [id number]. Moderate biatrial enlargement. Moderate mitral and tricuspid insufficiency.  Stress echo on 04/24/12 [facility]) showed: Indeterminate treadmill ECG due to baseline ECG changes. Average exercise tolerance for age. Abnormal stress echocardiographic images consistent with global LV dysfunction and no current evidence of ischemia.  CXR on 01/03/14 showed:  1. No acute cardiopulmonary process.  2. Cardiomegaly with left ventricular enlargement has minimally progressed compared to August 2011.  3. Aortic atherosclerosis.  4. Chronic bronchitic changes and mild interstitial prominence.   Preoperative labs noted.  BUN/Cr 13/1.04. H/H 11.7/34.9.  Her last EKG  sent from [facility] was from 03/29/12, so she will need a more recent baseline EKG on the day of surgery. HR was 76 bpm at PAT. She has cardiology clearance, so if no acute changes I would anticipate that she could proceed as planned.  George Hugh [facility] Short Stay Center/Anesthesiology Phone [phone] 01/04/2014 6:06 PM

## 2014-01-08 MED ORDER — CEFAZOLIN SODIUM-DEXTROSE 2-3 GM-% IV SOLR
2.0000 g | INTRAVENOUS | Status: AC
  Administered 2014-01-09: 2 g via INTRAVENOUS
  Filled 2014-01-08: qty 50

## 2014-01-08 NOTE — H&P (Signed)
Alexandra Martinez is an 80 y.o. female.   Chief Complaint:lumbar pain with bilateral extension [id number] complaining of lumbar pain with radiation to both extremities which gets worse with ambulation and better with sitting and flexion. Had an outpatient myelogram. Epidurals has not been of any help  Past Medical History  Diagnosis Date  . Hypothyroidism   . Osteoporosis   . Recurrent UTI   . GERD (gastroesophageal reflux disease)   . DDD (degenerative disc disease)   . Atrial fib/flutter, transient     [facility] admission for RVR 03/2009  . Hiatal hernia with gastroesophageal reflux 2008    by EGD,  [name]  . High cholesterol   . H/O hiatal hernia   . Hemorrhoids   . Nocturia   . Polyuria   . H/O bladder infections   . History of uterine fibroid   . Anemia     r/t uterine fibroids  . H/O thyroid nodule     surgically removed  . Hypertension     [facility]  . Heart murmur     moderate MR/TR 08/2013 echo [name])  . Hydronephrosis     uretero- with congenital UPJ obstruction- Dr. Edrick Oh  . Hydronephrosis with ureteropelvic junction obstruction     Past Surgical History  Procedure Laterality Date  . Cataract extraction      left  . Cholecystectomy    . Cervical disc surgery  1980's  . Thyroid lobectomy      partial, right lobe  . Total abdominal hysterectomy w/ bilateral salpingoophorectomy      Family History  Problem Relation Age of Onset  . Heart disease Mother   . Coronary artery disease Father     MI's   Social History:  reports that she has never smoked. She has never used smokeless tobacco. She reports that she does not drink alcohol or use illicit drugs.  Allergies:  Allergies  Allergen Reactions  . Codeine Itching and Rash  . Dexilant [Dexlansoprazole] Rash  . Eliquis [Apixaban] Rash    No prescriptions prior to admission    No results found for this or any previous visit (from the past 48 hour(s)). No results found.  Review of  Systems  Constitutional: Negative.   HENT: Negative.   Eyes: Negative.   Respiratory: Positive for shortness of breath.   Cardiovascular: Negative.   Gastrointestinal: Negative.   Genitourinary: Negative.   Musculoskeletal: Positive for back pain.  Skin: Negative.   Neurological: Positive for focal weakness.  Endo/Heme/Allergies: Negative.   Psychiatric/Behavioral: Negative.     There were no vitals taken for this visit. Physical Exam hent, nl. Neck, nl. Cv, nl. Lugs, clear. Abdomen, soft. Extremities ,grade1 edema. NEURO  Weakness of DF both feet. Femoral strecth positive bilaterally. Myelogram shows spondylolisthesis at l2-3, 4-5 . Borderline between 3-4  Assessment/Plan Decompression and fusion at 23, 45 and decision about 34 to be made during surgery. She and her niece are aware of risks and benefits  [name] M 01/08/2014, 2:16 PM

## 2014-01-09 ENCOUNTER — Encounter (HOSPITAL_COMMUNITY): Payer: Medicare Other | Admitting: Vascular Surgery

## 2014-01-09 ENCOUNTER — Inpatient Hospital Stay (HOSPITAL_COMMUNITY): Payer: Medicare Other | Admitting: Certified Registered"

## 2014-01-09 ENCOUNTER — Inpatient Hospital Stay (HOSPITAL_COMMUNITY)
Admission: RE | Admit: 2014-01-09 | Discharge: 2014-01-15 | DRG: 460 | Disposition: A | Discharge status: Skilled Nursing Facility | Payer: Medicare Other | Source: Ambulatory Visit | Attending: Neurosurgery | Admitting: Neurosurgery

## 2014-01-09 ENCOUNTER — Inpatient Hospital Stay (HOSPITAL_COMMUNITY): Payer: Medicare Other

## 2014-01-09 ENCOUNTER — Encounter (HOSPITAL_COMMUNITY)
Admission: RE | Disposition: A | Discharge status: Skilled Nursing Facility | Payer: Medicare Other | Source: Ambulatory Visit | Attending: Neurosurgery

## 2014-01-09 ENCOUNTER — Encounter (HOSPITAL_COMMUNITY): Payer: Self-pay | Admitting: *Deleted

## 2014-01-09 DIAGNOSIS — I10 Essential (primary) hypertension: Secondary | ICD-10-CM | POA: Diagnosis present

## 2014-01-09 DIAGNOSIS — M4310 Spondylolisthesis, site unspecified: Principal | ICD-10-CM | POA: Diagnosis present

## 2014-01-09 DIAGNOSIS — R269 Unspecified abnormalities of gait and mobility: Secondary | ICD-10-CM | POA: Diagnosis not present

## 2014-01-09 DIAGNOSIS — M5137 Other intervertebral disc degeneration, lumbosacral region: Secondary | ICD-10-CM | POA: Diagnosis present

## 2014-01-09 DIAGNOSIS — K219 Gastro-esophageal reflux disease without esophagitis: Secondary | ICD-10-CM | POA: Diagnosis present

## 2014-01-09 DIAGNOSIS — Z9889 Other specified postprocedural states: Secondary | ICD-10-CM | POA: Diagnosis not present

## 2014-01-09 DIAGNOSIS — I4891 Unspecified atrial fibrillation: Secondary | ICD-10-CM | POA: Diagnosis present

## 2014-01-09 DIAGNOSIS — E039 Hypothyroidism, unspecified: Secondary | ICD-10-CM | POA: Diagnosis present

## 2014-01-09 DIAGNOSIS — Z4789 Encounter for other orthopedic aftercare: Secondary | ICD-10-CM | POA: Diagnosis not present

## 2014-01-09 DIAGNOSIS — M51379 Other intervertebral disc degeneration, lumbosacral region without mention of lumbar back pain or lower extremity pain: Secondary | ICD-10-CM | POA: Diagnosis present

## 2014-01-09 DIAGNOSIS — M48062 Spinal stenosis, lumbar region with neurogenic claudication: Secondary | ICD-10-CM | POA: Diagnosis not present

## 2014-01-09 DIAGNOSIS — M48061 Spinal stenosis, lumbar region without neurogenic claudication: Secondary | ICD-10-CM | POA: Diagnosis not present

## 2014-01-09 DIAGNOSIS — M5136 Other intervertebral disc degeneration, lumbar region: Secondary | ICD-10-CM | POA: Diagnosis present

## 2014-01-09 DIAGNOSIS — M6281 Muscle weakness (generalized): Secondary | ICD-10-CM | POA: Diagnosis not present

## 2014-01-09 DIAGNOSIS — M545 Low back pain, unspecified: Secondary | ICD-10-CM | POA: Diagnosis not present

## 2014-01-09 DIAGNOSIS — IMO0002 Reserved for concepts with insufficient information to code with codable children: Secondary | ICD-10-CM | POA: Diagnosis not present

## 2014-01-09 DIAGNOSIS — D649 Anemia, unspecified: Secondary | ICD-10-CM | POA: Diagnosis not present

## 2014-01-09 DIAGNOSIS — M51369 Other intervertebral disc degeneration, lumbar region without mention of lumbar back pain or lower extremity pain: Secondary | ICD-10-CM | POA: Diagnosis present

## 2014-01-09 DIAGNOSIS — M549 Dorsalgia, unspecified: Secondary | ICD-10-CM | POA: Diagnosis not present

## 2014-01-09 DIAGNOSIS — Z5189 Encounter for other specified aftercare: Secondary | ICD-10-CM | POA: Diagnosis not present

## 2014-01-09 DIAGNOSIS — M4800 Spinal stenosis, site unspecified: Secondary | ICD-10-CM | POA: Diagnosis not present

## 2014-01-09 SURGERY — POSTERIOR LUMBAR FUSION 2 LEVEL
Anesthesia: General | Site: Back

## 2014-01-09 MED ORDER — INFLUENZA VAC SPLIT QUAD 0.5 ML IM SUSY
0.5000 mL | PREFILLED_SYRINGE | INTRAMUSCULAR | Status: AC
  Administered 2014-01-10: 0.5 mL via INTRAMUSCULAR
  Filled 2014-01-09: qty 0.5

## 2014-01-09 MED ORDER — ROCURONIUM BROMIDE 50 MG/5ML IV SOLN
INTRAVENOUS | Status: AC
  Filled 2014-01-09: qty 1

## 2014-01-09 MED ORDER — ACETAMINOPHEN 650 MG RE SUPP: 650.0000 mg | RECTAL | Status: DC | PRN

## 2014-01-09 MED ORDER — BUPIVACAINE LIPOSOME 1.3 % IJ SUSP
INTRAMUSCULAR | Status: DC | PRN
  Administered 2014-01-09: 20 mL

## 2014-01-09 MED ORDER — LEVOTHYROXINE SODIUM 50 MCG PO TABS
50.0000 ug | ORAL_TABLET | Freq: Every day | ORAL | Status: DC
  Administered 2014-01-10 – 2014-01-15 (×6): 50 ug via ORAL
  Filled 2014-01-09 (×8): qty 1

## 2014-01-09 MED ORDER — METOPROLOL TARTRATE 100 MG PO TABS
100.0000 mg | ORAL_TABLET | Freq: Two times a day (BID) | ORAL | Status: DC
  Administered 2014-01-09 – 2014-01-15 (×10): 100 mg via ORAL
  Filled 2014-01-09 (×15): qty 1

## 2014-01-09 MED ORDER — ONDANSETRON HCL 4 MG/2ML IJ SOLN: 4.0000 mg | Freq: Once | INTRAMUSCULAR | Status: DC | PRN

## 2014-01-09 MED ORDER — MORPHINE SULFATE 2 MG/ML IJ SOLN
1.0000 mg | INTRAMUSCULAR | Status: DC | PRN
  Administered 2014-01-09: 4 mg via INTRAVENOUS
  Administered 2014-01-11 – 2014-01-14 (×5): 2 mg via INTRAVENOUS
  Filled 2014-01-09 (×4): qty 1
  Filled 2014-01-09: qty 2
  Filled 2014-01-09: qty 1

## 2014-01-09 MED ORDER — THROMBIN 20000 UNITS EX SOLR
CUTANEOUS | Status: DC | PRN
  Administered 2014-01-09 (×2): via TOPICAL

## 2014-01-09 MED ORDER — CEFAZOLIN SODIUM 1-5 GM-% IV SOLN
1.0000 g | Freq: Three times a day (TID) | INTRAVENOUS | Status: AC
  Administered 2014-01-09 – 2014-01-10 (×2): 1 g via INTRAVENOUS
  Filled 2014-01-09 (×2): qty 50

## 2014-01-09 MED ORDER — MENTHOL 3 MG MT LOZG
1.0000 | LOZENGE | OROMUCOSAL | Status: DC | PRN
  Filled 2014-01-09: qty 9

## 2014-01-09 MED ORDER — LIDOCAINE HCL (CARDIAC) 20 MG/ML IV SOLN
INTRAVENOUS | Status: DC | PRN
  Administered 2014-01-09: 100 mg via INTRATRACHEAL
  Administered 2014-01-09: 40 mg via INTRAVENOUS

## 2014-01-09 MED ORDER — ZOLPIDEM TARTRATE 5 MG PO TABS: 5.0000 mg | ORAL_TABLET | Freq: Every evening | ORAL | Status: DC | PRN

## 2014-01-09 MED ORDER — PHENOL 1.4 % MT LIQD: 1.0000 | OROMUCOSAL | Status: DC | PRN

## 2014-01-09 MED ORDER — FENTANYL CITRATE 0.05 MG/ML IJ SOLN
INTRAMUSCULAR | Status: AC
  Filled 2014-01-09: qty 5

## 2014-01-09 MED ORDER — PHENYLEPHRINE HCL 10 MG/ML IJ SOLN
INTRAMUSCULAR | Status: DC | PRN
  Administered 2014-01-09 (×2): 80 ug via INTRAVENOUS
  Administered 2014-01-09: 40 ug via INTRAVENOUS
  Administered 2014-01-09 (×6): 80 ug via INTRAVENOUS

## 2014-01-09 MED ORDER — POTASSIUM CHLORIDE IN NACL 20-0.9 MEQ/L-% IV SOLN
INTRAVENOUS | Status: DC
  Administered 2014-01-09 – 2014-01-11 (×4): via INTRAVENOUS
  Filled 2014-01-09 (×12): qty 1000

## 2014-01-09 MED ORDER — LACTATED RINGERS IV SOLN
INTRAVENOUS | Status: DC | PRN
  Administered 2014-01-09 (×3): via INTRAVENOUS

## 2014-01-09 MED ORDER — EPHEDRINE SULFATE 50 MG/ML IJ SOLN
INTRAMUSCULAR | Status: DC | PRN
  Administered 2014-01-09: 15 mg via INTRAVENOUS

## 2014-01-09 MED ORDER — 0.9 % SODIUM CHLORIDE (POUR BTL) OPTIME
TOPICAL | Status: DC | PRN
  Administered 2014-01-09 (×2): 1000 mL

## 2014-01-09 MED ORDER — ONDANSETRON HCL 4 MG/2ML IJ SOLN: 4.0000 mg | INTRAMUSCULAR | Status: DC | PRN

## 2014-01-09 MED ORDER — ARTIFICIAL TEARS OP OINT
TOPICAL_OINTMENT | OPHTHALMIC | Status: AC
  Filled 2014-01-09: qty 3.5

## 2014-01-09 MED ORDER — GLYCOPYRROLATE 0.2 MG/ML IJ SOLN
INTRAMUSCULAR | Status: DC | PRN
  Administered 2014-01-09: 0.2 mg via INTRAVENOUS
  Administered 2014-01-09: 0.4 mg via INTRAVENOUS

## 2014-01-09 MED ORDER — LORATADINE 10 MG PO TABS
10.0000 mg | ORAL_TABLET | Freq: Every day | ORAL | Status: DC
  Administered 2014-01-10 – 2014-01-15 (×3): 10 mg via ORAL
  Filled 2014-01-09 (×7): qty 1

## 2014-01-09 MED ORDER — PROPOFOL 10 MG/ML IV BOLUS
INTRAVENOUS | Status: DC | PRN
  Administered 2014-01-09: 130 mg via INTRAVENOUS

## 2014-01-09 MED ORDER — LIDOCAINE HCL (CARDIAC) 20 MG/ML IV SOLN
INTRAVENOUS | Status: AC
  Filled 2014-01-09: qty 5

## 2014-01-09 MED ORDER — ACETAMINOPHEN 325 MG PO TABS
650.0000 mg | ORAL_TABLET | ORAL | Status: DC | PRN
Start: 2014-01-09 — End: 2014-01-15

## 2014-01-09 MED ORDER — OXYCODONE-ACETAMINOPHEN 5-325 MG PO TABS
1.0000 | ORAL_TABLET | ORAL | Status: DC | PRN
  Administered 2014-01-09 – 2014-01-15 (×20): 2 via ORAL
  Administered 2014-01-15: 1 via ORAL
  Filled 2014-01-09 (×2): qty 2
  Filled 2014-01-09: qty 1
  Filled 2014-01-09 (×18): qty 2

## 2014-01-09 MED ORDER — HYDROMORPHONE HCL 1 MG/ML IJ SOLN
0.2500 mg | INTRAMUSCULAR | Status: DC | PRN
  Administered 2014-01-09: 0.25 mg via INTRAVENOUS
  Administered 2014-01-09: 0.5 mg via INTRAVENOUS
  Administered 2014-01-09: 0.25 mg via INTRAVENOUS

## 2014-01-09 MED ORDER — VANCOMYCIN HCL 1000 MG IV SOLR
INTRAVENOUS | Status: AC
  Filled 2014-01-09: qty 2000

## 2014-01-09 MED ORDER — WHITE PETROLATUM GEL
Status: AC
  Administered 2014-01-09: 0.2
  Filled 2014-01-09: qty 5

## 2014-01-09 MED ORDER — PHENYLEPHRINE HCL 10 MG/ML IJ SOLN
10.0000 mg | INTRAVENOUS | Status: DC | PRN
  Administered 2014-01-09: 20 ug/min via INTRAVENOUS

## 2014-01-09 MED ORDER — SODIUM CHLORIDE 0.9 % IV SOLN: INTRAVENOUS | Status: DC

## 2014-01-09 MED ORDER — SODIUM CHLORIDE 0.9 % IJ SOLN
3.0000 mL | Freq: Two times a day (BID) | INTRAMUSCULAR | Status: DC
  Administered 2014-01-12 – 2014-01-14 (×5): 3 mL via INTRAVENOUS

## 2014-01-09 MED ORDER — PROPOFOL 10 MG/ML IV BOLUS
INTRAVENOUS | Status: AC
  Filled 2014-01-09: qty 20

## 2014-01-09 MED ORDER — SODIUM CHLORIDE 0.9 % IJ SOLN
3.0000 mL | INTRAMUSCULAR | Status: DC | PRN
  Administered 2014-01-14 (×2): 3 mL via INTRAVENOUS

## 2014-01-09 MED ORDER — MUPIROCIN 2 % EX OINT
1.0000 "application " | TOPICAL_OINTMENT | Freq: Two times a day (BID) | CUTANEOUS | Status: AC
  Administered 2014-01-09 – 2014-01-14 (×10): 1 via NASAL
  Filled 2014-01-09: qty 22

## 2014-01-09 MED ORDER — NEOSTIGMINE METHYLSULFATE 10 MG/10ML IV SOLN
INTRAVENOUS | Status: AC
  Filled 2014-01-09: qty 1

## 2014-01-09 MED ORDER — NEOSTIGMINE METHYLSULFATE 10 MG/10ML IV SOLN
INTRAVENOUS | Status: DC | PRN
  Administered 2014-01-09: 3 mg via INTRAVENOUS

## 2014-01-09 MED ORDER — PHENYLEPHRINE 40 MCG/ML (10ML) SYRINGE FOR IV PUSH (FOR BLOOD PRESSURE SUPPORT)
PREFILLED_SYRINGE | INTRAVENOUS | Status: AC
  Filled 2014-01-09: qty 10

## 2014-01-09 MED ORDER — SODIUM CHLORIDE 0.9 % IV SOLN
INTRAVENOUS | Status: DC | PRN
  Administered 2014-01-09: 12:00:00 via INTRAVENOUS

## 2014-01-09 MED ORDER — ALBUMIN HUMAN 5 % IV SOLN
INTRAVENOUS | Status: DC | PRN
  Administered 2014-01-09 (×2): via INTRAVENOUS

## 2014-01-09 MED ORDER — EPHEDRINE SULFATE 50 MG/ML IJ SOLN
INTRAMUSCULAR | Status: AC
  Filled 2014-01-09: qty 1

## 2014-01-09 MED ORDER — GLYCOPYRROLATE 0.2 MG/ML IJ SOLN
INTRAMUSCULAR | Status: AC
Start: 2014-01-09 — End: 2014-01-09
  Filled 2014-01-09: qty 1

## 2014-01-09 MED ORDER — ARTIFICIAL TEARS OP OINT
TOPICAL_OINTMENT | OPHTHALMIC | Status: DC | PRN
  Administered 2014-01-09: 1 via OPHTHALMIC

## 2014-01-09 MED ORDER — SUCCINYLCHOLINE CHLORIDE 20 MG/ML IJ SOLN
INTRAMUSCULAR | Status: AC
  Filled 2014-01-09: qty 1

## 2014-01-09 MED ORDER — FENTANYL CITRATE 0.05 MG/ML IJ SOLN
INTRAMUSCULAR | Status: DC | PRN
  Administered 2014-01-09: 100 ug via INTRAVENOUS
  Administered 2014-01-09: 25 ug via INTRAVENOUS
  Administered 2014-01-09 (×2): 50 ug via INTRAVENOUS
  Administered 2014-01-09: 25 ug via INTRAVENOUS

## 2014-01-09 MED ORDER — ROCURONIUM BROMIDE 100 MG/10ML IV SOLN
INTRAVENOUS | Status: DC | PRN
  Administered 2014-01-09: 30 mg via INTRAVENOUS

## 2014-01-09 MED ORDER — VANCOMYCIN HCL 1000 MG IV SOLR
INTRAVENOUS | Status: DC | PRN
  Administered 2014-01-09 (×2): 1000 mg

## 2014-01-09 MED ORDER — ONDANSETRON HCL 4 MG/2ML IJ SOLN
INTRAMUSCULAR | Status: AC
  Filled 2014-01-09: qty 2

## 2014-01-09 MED ORDER — HYDROMORPHONE HCL 1 MG/ML IJ SOLN
INTRAMUSCULAR | Status: AC
  Filled 2014-01-09: qty 1

## 2014-01-09 MED ORDER — GLYCOPYRROLATE 0.2 MG/ML IJ SOLN
INTRAMUSCULAR | Status: AC
  Filled 2014-01-09: qty 2

## 2014-01-09 MED ORDER — ONDANSETRON HCL 4 MG/2ML IJ SOLN
INTRAMUSCULAR | Status: DC | PRN
  Administered 2014-01-09: 4 mg via INTRAVENOUS

## 2014-01-09 MED ORDER — SODIUM CHLORIDE 0.9 % IJ SOLN
INTRAMUSCULAR | Status: AC
Start: 2014-01-09 — End: 2014-01-09
  Filled 2014-01-09: qty 10

## 2014-01-09 MED ORDER — BUPIVACAINE LIPOSOME 1.3 % IJ SUSP
20.0000 mL | INTRAMUSCULAR | Status: DC
  Filled 2014-01-09: qty 20

## 2014-01-09 MED ORDER — SODIUM CHLORIDE 0.9 % IV SOLN: 250.0000 mL | INTRAVENOUS | Status: DC

## 2014-01-09 MED ORDER — CHLORHEXIDINE GLUCONATE CLOTH 2 % EX PADS
6.0000 | MEDICATED_PAD | Freq: Every day | CUTANEOUS | Status: AC
  Administered 2014-01-10 – 2014-01-14 (×5): 6 via TOPICAL

## 2014-01-09 MED ORDER — DIAZEPAM 5 MG PO TABS
5.0000 mg | ORAL_TABLET | Freq: Four times a day (QID) | ORAL | Status: DC | PRN
  Administered 2014-01-09: 5 mg via ORAL
  Filled 2014-01-09: qty 1

## 2014-01-09 SURGICAL SUPPLY — 73 items
BENZOIN TINCTURE PRP APPL 2/3 (GAUZE/BANDAGES/DRESSINGS) ×2 IMPLANT
BLADE SURG ROTATE 9660 (MISCELLANEOUS) IMPLANT
BUR ACORN 6.0 (BURR) ×2 IMPLANT
BUR MATCHSTICK NEURO 3.0 LAGG (BURR) ×2 IMPLANT
CANISTER SUCT 3000ML (MISCELLANEOUS) ×2 IMPLANT
CAP REVERE LOCKING (Cap) ×16 IMPLANT
CONN CROSSLINK REV 6.35 48-60 (Connector) ×2 IMPLANT
CONNECTOR CRSLNK REV6.35 48-60 (Connector) ×1 IMPLANT
CONT SPEC 4OZ CLIKSEAL STRL BL (MISCELLANEOUS) ×2 IMPLANT
COVER BACK TABLE 24X17X13 BIG (DRAPES) IMPLANT
COVER TABLE BACK 60X90 (DRAPES) ×2 IMPLANT
DRAPE C-ARM 42X72 X-RAY (DRAPES) ×4 IMPLANT
DRAPE LAPAROTOMY 100X72X124 (DRAPES) ×2 IMPLANT
DRAPE POUCH INSTRU U-SHP 10X18 (DRAPES) ×2 IMPLANT
DRSG OPSITE 4X5.5 SM (GAUZE/BANDAGES/DRESSINGS) ×4 IMPLANT
DRSG OPSITE POSTOP 4X8 (GAUZE/BANDAGES/DRESSINGS) ×2 IMPLANT
DURAPREP 26ML APPLICATOR (WOUND CARE) ×2 IMPLANT
ELECT BLADE 4.0 EZ CLEAN MEGAD (MISCELLANEOUS) ×2
ELECT REM PT RETURN 9FT ADLT (ELECTROSURGICAL) ×2
ELECTRODE BLDE 4.0 EZ CLN MEGD (MISCELLANEOUS) ×1 IMPLANT
ELECTRODE REM PT RTRN 9FT ADLT (ELECTROSURGICAL) ×1 IMPLANT
EVACUATOR 1/8 PVC DRAIN (DRAIN) IMPLANT
EVACUATOR 3/16  PVC DRAIN (DRAIN) ×2
EVACUATOR 3/16 PVC DRAIN (DRAIN) ×2 IMPLANT
GAUZE SPONGE 4X4 12PLY STRL (GAUZE/BANDAGES/DRESSINGS) ×2 IMPLANT
GAUZE SPONGE 4X4 16PLY XRAY LF (GAUZE/BANDAGES/DRESSINGS) ×2 IMPLANT
GLOVE BIOGEL M 8.0 STRL (GLOVE) ×2 IMPLANT
GLOVE ECLIPSE 6.5 STRL STRAW (GLOVE) ×2 IMPLANT
GLOVE EXAM NITRILE LRG STRL (GLOVE) IMPLANT
GLOVE EXAM NITRILE MD LF STRL (GLOVE) IMPLANT
GLOVE EXAM NITRILE XL STR (GLOVE) IMPLANT
GLOVE EXAM NITRILE XS STR PU (GLOVE) IMPLANT
GLOVE INDICATOR 7.5 STRL GRN (GLOVE) ×6 IMPLANT
GLOVE INDICATOR 8.0 STRL GRN (GLOVE) ×2 IMPLANT
GLOVE INDICATOR 8.5 STRL (GLOVE) ×4 IMPLANT
GOWN STRL REUS W/ TWL LRG LVL3 (GOWN DISPOSABLE) ×2 IMPLANT
GOWN STRL REUS W/ TWL XL LVL3 (GOWN DISPOSABLE) IMPLANT
GOWN STRL REUS W/TWL 2XL LVL3 (GOWN DISPOSABLE) ×4 IMPLANT
GOWN STRL REUS W/TWL LRG LVL3 (GOWN DISPOSABLE) ×2
GOWN STRL REUS W/TWL XL LVL3 (GOWN DISPOSABLE)
KIT BASIN OR (CUSTOM PROCEDURE TRAY) ×2 IMPLANT
KIT INFUSE LRG II (Orthopedic Implant) ×2 IMPLANT
KIT ROOM TURNOVER OR (KITS) ×2 IMPLANT
MILL MEDIUM DISP (BLADE) ×2 IMPLANT
NEEDLE HYPO 18GX1.5 BLUNT FILL (NEEDLE) IMPLANT
NEEDLE HYPO 21X1.5 SAFETY (NEEDLE) IMPLANT
NEEDLE HYPO 25X1 1.5 SAFETY (NEEDLE) IMPLANT
NS IRRIG 1000ML POUR BTL (IV SOLUTION) ×4 IMPLANT
PACK LAMINECTOMY NEURO (CUSTOM PROCEDURE TRAY) ×2 IMPLANT
PAD ABD 8X10 STRL (GAUZE/BANDAGES/DRESSINGS) IMPLANT
PAD ARMBOARD 7.5X6 YLW CONV (MISCELLANEOUS) ×6 IMPLANT
PATTIES SURGICAL .5 X1 (DISPOSABLE) ×2 IMPLANT
PATTIES SURGICAL .5 X3 (DISPOSABLE) IMPLANT
PATTIES SURGICAL 1X1 (DISPOSABLE) ×2 IMPLANT
ROD REVERE 6.35 CURVED 125MM (Rod) ×4 IMPLANT
SCREW 4.5X45MM (Screw) ×16 IMPLANT
SPACER RISE 8X22 11-17MM-15 (Spacer) ×4 IMPLANT
SPONGE LAP 4X18 X RAY DECT (DISPOSABLE) IMPLANT
SPONGE NEURO XRAY DETECT 1X3 (DISPOSABLE) IMPLANT
SPONGE SURGIFOAM ABS GEL 100 (HEMOSTASIS) ×4 IMPLANT
STRIP CLOSURE SKIN 1/2X4 (GAUZE/BANDAGES/DRESSINGS) ×2 IMPLANT
STRIP VITOSS 25X100X4MM (Neuro Prosthesis/Implant) ×2 IMPLANT
SUT VIC AB 1 CT1 18XBRD ANBCTR (SUTURE) ×2 IMPLANT
SUT VIC AB 1 CT1 8-18 (SUTURE) ×2
SUT VIC AB 2-0 CP2 18 (SUTURE) ×2 IMPLANT
SUT VIC AB 3-0 SH 8-18 (SUTURE) ×2 IMPLANT
SYR 20CC LL (SYRINGE) IMPLANT
SYR 20ML ECCENTRIC (SYRINGE) ×2 IMPLANT
SYR 5ML LL (SYRINGE) IMPLANT
TOWEL OR 17X24 6PK STRL BLUE (TOWEL DISPOSABLE) ×2 IMPLANT
TOWEL OR 17X26 10 PK STRL BLUE (TOWEL DISPOSABLE) ×2 IMPLANT
TRAY FOLEY CATH 14FRSI W/METER (CATHETERS) ×2 IMPLANT
WATER STERILE IRR 1000ML POUR (IV SOLUTION) ×2 IMPLANT

## 2014-01-09 NOTE — Anesthesia Postprocedure Evaluation (Signed)
  Anesthesia Post-op Note  Patient: Alexandra Martinez  Procedure(s) Performed: Procedure(s): LUMBAR TWO TO THREE, LUMBAR FOUR TO FIVE POSTERIOR LUMBAR INTERBODY FUSION  (N/A)  Patient Location: PACU  Anesthesia Type:General  Level of Consciousness: awake, oriented, sedated and patient cooperative  Airway and Oxygen Therapy: Patient Spontanous Breathing  Post-op Pain: mild  Post-op Assessment: Post-op Vital signs reviewed, Patient's Cardiovascular Status Stable, Respiratory Function Stable, Patent Airway, No signs of Nausea or vomiting and Pain level controlled  Post-op Vital Signs: stable  Last Vitals:  Filed Vitals:   01/09/14 1425  BP:   Pulse: 58  Temp:   Resp: 17    Complications: No apparent anesthesia complications

## 2014-01-09 NOTE — Transfer of Care (Signed)
Immediate Anesthesia Transfer of Care Note  Patient: Alexandra Martinez  Procedure(s) Performed: Procedure(s): LUMBAR TWO TO THREE, LUMBAR FOUR TO FIVE POSTERIOR LUMBAR INTERBODY FUSION  (N/A)  Patient Location: PACU  Anesthesia Type:General  Level of Consciousness: sedated and responds to stimulation  Airway & Oxygen Therapy: Patient Spontanous Breathing and Patient connected to nasal cannula oxygen  Post-op Assessment: Report given to PACU RN and Post -op Vital signs reviewed and stable  Post vital signs: Reviewed and stable  Complications: No apparent anesthesia complications

## 2014-01-09 NOTE — Progress Notes (Signed)
Utilization review completed.  

## 2014-01-09 NOTE — Anesthesia Procedure Notes (Signed)
Procedure Name: Intubation Date/Time: 01/09/2014 8:46 AM Performed by: Julian Reil Pre-anesthesia Checklist: Patient identified, Emergency Drugs available, Suction available and Patient being monitored Patient Re-evaluated:Patient Re-evaluated prior to inductionOxygen Delivery Method: Circle system utilized Preoxygenation: Pre-oxygenation with 100% oxygen Intubation Type: IV induction Ventilation: Mask ventilation without difficulty Laryngoscope Size: Mac and 3 Grade View: Grade II Tube type: Oral Tube size: 7.0 mm Number of attempts: 1 Airway Equipment and Method: Stylet and LTA kit utilized Placement Confirmation: ETT inserted through vocal cords under direct vision,  positive ETCO2 and breath sounds checked- equal and bilateral Secured at: 21 cm Tube secured with: Tape Dental Injury: Teeth and Oropharynx as per pre-operative assessment

## 2014-01-09 NOTE — Anesthesia Preprocedure Evaluation (Signed)
Anesthesia Evaluation  Patient identified by MRN, date of birth, ID band Patient awake    Reviewed: Allergy & Precautions, H&P , NPO status , Patient's Chart, lab work & pertinent test results  Airway       Dental   Pulmonary          Cardiovascular hypertension, + dysrhythmias Atrial Fibrillation     Neuro/Psych    GI/Hepatic hiatal hernia, GERD-  ,  Endo/Other  Hypothyroidism   Renal/GU Renal disease     Musculoskeletal  (+) Arthritis -,   Abdominal   Peds  Hematology  (+) anemia ,   Anesthesia Other Findings   Reproductive/Obstetrics                           Anesthesia Physical Anesthesia Plan  ASA: III  Anesthesia Plan: General   Post-op Pain Management:    Induction: Intravenous  Airway Management Planned: Oral ETT  Additional Equipment:   Intra-op Plan:   Post-operative Plan: Extubation in OR  Informed Consent: I have reviewed the patients History and Physical, chart, labs and discussed the procedure including the risks, benefits and alternatives for the proposed anesthesia with the patient or authorized representative who has indicated his/her understanding and acceptance.     Plan Discussed with: CRNA, Anesthesiologist and Surgeon  Anesthesia Plan Comments:         Anesthesia Quick Evaluation

## 2014-01-10 DIAGNOSIS — M4310 Spondylolisthesis, site unspecified: Secondary | ICD-10-CM | POA: Diagnosis not present

## 2014-01-10 LAB — CBC WITH DIFFERENTIAL/PLATELET
Basophils Absolute: 0 10*3/uL (ref 0.0–0.1)
Basophils Relative: 0 % (ref 0–1)
Eosinophils Absolute: 0 10*3/uL (ref 0.0–0.7)
Eosinophils Relative: 0 % (ref 0–5)
HCT: 20.5 % — ABNORMAL LOW (ref 36.0–46.0)
Hemoglobin: 6.9 g/dL — CL (ref 12.0–15.0)
Lymphocytes Relative: 16 % (ref 12–46)
Lymphs Abs: 1.4 10*3/uL (ref 0.7–4.0)
MCH: 30.9 pg (ref 26.0–34.0)
MCHC: 33.7 g/dL (ref 30.0–36.0)
MCV: 91.9 fL (ref 78.0–100.0)
Monocytes Absolute: 1.2 10*3/uL — ABNORMAL HIGH (ref 0.1–1.0)
Monocytes Relative: 14 % — ABNORMAL HIGH (ref 3–12)
Neutro Abs: 6.1 10*3/uL (ref 1.7–7.7)
Neutrophils Relative %: 70 % (ref 43–77)
Platelets: 99 10*3/uL — ABNORMAL LOW (ref 150–400)
RBC: 2.23 MIL/uL — ABNORMAL LOW (ref 3.87–5.11)
RDW: 12.9 % (ref 11.5–15.5)
WBC: 8.8 10*3/uL (ref 4.0–10.5)

## 2014-01-10 LAB — PREPARE RBC (CROSSMATCH)

## 2014-01-10 MED ORDER — SODIUM CHLORIDE 0.9 % IV SOLN
Freq: Once | INTRAVENOUS | Status: AC
  Administered 2014-01-10: 15:00:00 via INTRAVENOUS

## 2014-01-10 MED ORDER — SODIUM CHLORIDE 0.9 % IV SOLN: Freq: Once | INTRAVENOUS | Status: DC

## 2014-01-10 MED FILL — Sodium Chloride IV Soln 0.9%: INTRAVENOUS | Qty: 1000 | Status: AC

## 2014-01-10 MED FILL — Sodium Chloride Irrigation Soln 0.9%: Qty: 3000 | Status: AC

## 2014-01-10 MED FILL — Heparin Sodium (Porcine) Inj 1000 Unit/ML: INTRAMUSCULAR | Qty: 30 | Status: AC

## 2014-01-10 NOTE — Clinical Social Work Placement (Addendum)
Clinical Social Work Department CLINICAL SOCIAL WORK PLACEMENT NOTE 01/10/2014  Patient:  Alexandra Martinez, Alexandra Martinez  Account Number:  [id number] Admit date:  01/09/2014  Clinical Social Worker:  Greta Doom, LCSWA  Date/time:  01/10/2014 01:38 PM  Clinical Social Work is seeking post-discharge placement for this patient at the following level of care:   SKILLED NURSING   (*CSW will update this form in Epic as items are completed)   01/10/2014  Patient/family provided with [place] Department of Clinical Social Work's list of facilities offering this level of care within the geographic area requested by the patient (or if unable, by the patient's family).  01/10/2014  Patient/family informed of their freedom to choose among providers that offer the needed level of care, that participate in Medicare, Medicaid or managed care program needed by the patient, have an available bed and are willing to accept the patient.  01/10/2014  Patient/family informed of [facility]' ownership interest in [facility], as well as of the fact that they are under no obligation to receive care at this facility.  PASARR submitted to EDS on 01/10/2014 PASARR number received on 01/10/2014  FL2 transmitted to all facilities in geographic area requested by pt/family on  01/10/2014 FL2 transmitted to all facilities within larger geographic area on 01/10/2014  Patient informed that his/her managed care company has contracts with or will negotiate with  certain facilities, including the following:     Patient/family informed of bed offers received:  01/11/2014 Patient chooses bed at [facility]  Physician recommends and patient chooses bed at    Patient to be transferred to [facility]  on  01/15/2014 Patient to be transferred to facility by [facility]  Patient and family notified of transfer on 01/14/2014 Name of family member notified:  [name], niece   The following physician request were entered in  Epic:   Additional Comments:  [place], MSW, [place]

## 2014-01-10 NOTE — Care Management Note (Signed)
    Page 1 of 1   01/14/2014     3:29:37 PM CARE MANAGEMENT NOTE 01/14/2014  Patient:  Alexandra, Martinez   Account Number:  [id number]  Date Initiated:  01/10/2014  Documentation initiated by:  Marvetta Gibbons  Subjective/Objective Assessment:   Pt admitted s/p lumbar laminectomy, facetectomy, decompression     Action/Plan:   PTA pt lived  at home alone- PT/OT evals pending- CIR consulted- will f/u for recommendations   Anticipated DC Date:  01/14/2014   Anticipated DC Plan:  [place]  In-house referral  Clinical Social Worker      DC Planning Services  CM consult      Choice offered to / List presented to:             Status of service:  Completed, signed off Medicare Important Message given?  YES (If response is "NO", the following Medicare IM given date fields will be blank) Date Medicare IM given:  01/11/2014 Medicare IM given by:  Marvetta Gibbons Date Additional Medicare IM given:  01/14/2014 Additional Medicare IM given by:  Marvetta Gibbons  Discharge Disposition:  [place]  Per UR Regulation:  Reviewed for med. necessity/level of care/duration of stay  If discussed at [place] of Stay Meetings, dates discussed:    Comments:  01/11/14- 1030- Marvetta Gibbons RN, BSN [phone] Pt and family desire STSNF for rehab, CSW working on placement needs (preference for either [facility] or [facility])- copy of admission Medicare Im given to pt

## 2014-01-10 NOTE — Plan of Care (Signed)
Problem: Acute Rehab PT Goals(only PT should resolve) Goal: STG - Patient will perform bed mobility Pt will be independent with rolling.

## 2014-01-10 NOTE — Progress Notes (Signed)
[name], female   DOB: [birth year], 80 y.o.   MRN: [id number] To 4th floor

## 2014-01-10 NOTE — Evaluation (Signed)
Physical Therapy Evaluation Patient Details Name: Alexandra Martinez MRN: [id number] DOB: [birth year] Today's Date: 01/10/2014   History of Present Illness  Pt is an 80 y.o. female presenting s/p L2-3, L4-5 decompression/fusion. Hx of paroxsymal afib, heart murmur, hypothryroidsim, hydronephrosis, recurrent UTI, GERD, hypercholestrolemia, DDD and cervical spine surgery.  Clinical Impression  Pt requires max A for bed mobility and cueing for maintenance of neutral spine. Other functional mobility tasks not assessed due to session ending early 2/2 pt becoming severely hypotensive (80s/40s) with sitting EOB. Pt will benefit from continued skilled PT services to improve current functional status, further assess mobility and for additional education on back precautions with mobility. Pt expecting to d/c to SNF.     Follow Up Recommendations SNF    Equipment Recommendations  Other (comment) (tbd)    Recommendations for Other Services       Precautions / Restrictions Precautions Precautions: Back;Fall Precaution Booklet Issued: No Precaution Comments: Pt educated on technique (log roll, neutral spine for sidelying to sit) for bed mobility       Mobility  Bed Mobility Overal bed mobility: Needs Assistance Bed Mobility: Rolling;Sidelying to Sit;Sit to Sidelying Rolling: Max assist Sidelying to sit: Max assist     Sit to sidelying: Max assist General bed mobility comments: Pt requires assist 2/2 pain. Assist to position LE for rolling, move LE off bed for side lying to sit and elevate LE for sit to sidelying. Pt requires assist to elevate up off bed for sidelying to sit and sit to sidelying.  Assist to square hips on EOB. VC provided for technique for back precautions. Pt reports dizziness with sitting EOB.  Transfers                    Ambulation/Gait                Stairs            Wheelchair Mobility    Modified Rankin (Stroke Patients Only)       Balance  Overall balance assessment: Needs assistance Sitting-balance support: Feet supported;Bilateral upper extremity supported Sitting balance-Leahy Scale: Fair Sitting balance - Comments: Pt requires min A to reduce trunk sway with sitting EOB                                     Pertinent Vitals/Pain Pain Assessment: 0-10 Pain Score: 10-Worst pain ever Pain Location: back-incision Pain Intervention(s): Limited activity within patient's tolerance;Monitored during session;Premedicated before session    Home Living Family/patient expects to be discharged to:: Skilled nursing facility Living Arrangements: Alone               Additional Comments: Pt reports no steps to enter home, 2 steps inside home to enter kitchen    Prior Function Level of Independence: Independent         Comments:  Pt also reports having a cane with 3 prongs at home which she bought just in case, but was not using PTA     Hand Dominance        Extremity/Trunk Assessment   Upper Extremity Assessment: Generalized weakness           Lower Extremity Assessment: Generalized weakness         Communication   Communication: No difficulties  Cognition Arousal/Alertness: Awake/alert Behavior During Therapy: WFL for tasks assessed/performed Overall Cognitive Status: Within Functional Limits for tasks assessed  General Comments General comments (skin integrity, edema, etc.): Sitting EOB pt reports dizziness, "not feeling right," with diaphoresis, BP 80s/40s. Pt returned to supine. Once supine with HOB elevated, BP 100s/high 30s. NSG made aware..Session ended once pt back in bed. Pt educated on back precautions with bed mobility.    Exercises        Assessment/Plan    PT Assessment Patient needs continued PT services  PT Diagnosis Generalized weakness;Acute pain   PT Problem List Decreased balance;Decreased mobility;Decreased knowledge of use of  DME;Decreased knowledge of precautions;Decreased safety awareness;Decreased strength;Decreased range of motion;Decreased activity tolerance  PT Treatment Interventions DME instruction;Gait training;Functional mobility training;Therapeutic activities;Therapeutic exercise;Patient/family education;Neuromuscular re-education;Balance training   PT Goals (Current goals can be found in the Care Plan section) Acute Rehab PT Goals Patient Stated Goal: to have less pain PT Goal Formulation: With patient Time For Goal Achievement: 01/24/14 Potential to Achieve Goals: Good    Frequency Min 5X/week   Barriers to discharge        Co-evaluation               End of Session   Activity Tolerance: Treatment limited secondary to medical complications (Comment) (Hypotensive) Patient left: in bed;with call bell/phone within reach Nurse Communication: Patient requests pain meds;Other (comment) (BP changes)         Time: 1024-1050 PT Time Calculation (min): 26 min   Charges:         PT G Codes:          Keanen Dohse 01/10/2014, 11:21 AM

## 2014-01-10 NOTE — Evaluation (Signed)
reviewed and agree with assessment and POC, will continue to see and progress as tolerated.  Alben Deeds, [place] DPT  [phone]

## 2014-01-10 NOTE — Op Note (Signed)
NAMERASHEEDA, RADU                ACCOUNT NO.:  [id number]  MEDICAL RECORD NO.:  [id number]  LOCATION:  3S04C                        FACILITY:  [facility]  PHYSICIAN:  Leeroy Cha, M.D.   DATE OF BIRTH:  Jul 08, 1929  DATE OF PROCEDURE:  01/09/2014 DATE OF DISCHARGE:                              OPERATIVE REPORT   PREOPERATIVE DIAGNOSES: 1. L2-L3 degenerative disk disease with chronic lumbar radiculopathy. 2. L4-L5 degenerative spondylolisthesis with stenosis and chronic     radiculopathy.  POSTOPERATIVE DIAGNOSES: 1. L2-L3 degenerative disk disease with chronic lumbar radiculopathy. 2. L4-L5 degenerative spondylolisthesis with stenosis and chronic     radiculopathy.  PROCEDURE: 1. L2 laminectomy, facetectomy, decompression of the thecal sac, as     well as the L2 and L3 nerve root. 2. L4-L5 bilateral laminectomy and facetectomy. 3. Lysis of adhesion. 4. Bilateral L4-L5 discectomy, more than normal to introduce 2 cages. 5. Pedicle screws L2, L3, L4, L5. 6. Posterolateral arthrodesis with Vitoss, autograft, and BMP. 7. Cell Saver. 8. C-arm.  SURGEON:  Leeroy Cha, M.D.  CLINICAL HISTORY:  The patient was admitted because of back pain, worsened to both legs.  The patient many years ago had surgery at L4-5. There were 2 types of pain, one going to the groin and to the knees and the other one  going to both feet.  Myelogram showed that she has had degenerative disk disease with stenosis at L2-3 and at the area where she had surgery.  At L4-5, she has had degenerative disk disease with staples.  The patient has failed with conservative treatment including epidural injection.  She and her niece decided with surgery.  They knew the risk of the surgery as well as the benefits.  DESCRIPTION OF PROCEDURE:  The patient had been taking anticoagulant for many years and she stopped.  After intubation, she was positioned in a prone manner and the wound was cleaned with Betadine.  Once  it was dry, we applied the drapes.  Then, midline incision from L1-2 down to L4-5 was made when retracting the muscle laterally.  Indeed what we found immediately that although she has not been taking Xarelto for at least 2 weeks, noted that she has quite a bit of diffuse bleeding.  Hemostasis was accomplished.  We retracted  muscle all the way laterally until we were able to see and feel the facet of L2-3, L3-4, L5-5.  From then on, we proceeded with removal of the spinous process of L2 and L4 as well as the lamina.  At the level of L2-3, we tried to get into the disk space, but it was quite narrow.  Because of that, we proceeded with decompression of the thecal sac as well as the L2 and L3 nerve root.  At the level of L4, we did laminectomy and facetectomy.  Lysis of adhesion was accomplished, and we were able to get into the disk space bilaterally with total gross resection.  The endplate were removed and 2 cages were introduced.  We had BMP inside the cage.  The height was 11 and we were able to move all the way up to 17.  The rest of the disk was still  without BMP and autograft.  Then, using the C-arm first in AP view and then a lateral view, we probed the pedicles 2, 3, 4, 5.  Prior to introducing these screws, we feel all 4 quadrants of all the 8 holes just to be sure that we were surrounded by bone.  Then, 8 screws of 4.5 x 45 were inserted followed by a rod bilaterally and Capps.  Cross-link from right to left was done.  Then, we went laterally and we removed the periosteum of the L2 and the facet was L2-3, L3-4, L4-5 and a mix of the Vitoss, BMP, and autograft was used for arthrodesis.  The area was irrigated.  Two large drain were left.  The wound was closed with several layers of Vicryl and staples.          ______________________________ Leeroy Cha, M.D.     EB/MEDQ  D:  01/09/2014  T:  01/10/2014  Job:  [id number]

## 2014-01-10 NOTE — Progress Notes (Signed)
[name], female   DOB: 1930/03/14, 80 y.o.   MRN: [id number] Stable, no weakness. C/o incisional pain. hemovac working. To check cbc. To 4 th floor

## 2014-01-10 NOTE — Clinical Social Work Psychosocial (Signed)
Clinical Social Work Department BRIEF PSYCHOSOCIAL ASSESSMENT 01/10/2014  Patient:  Alexandra Martinez, Alexandra Martinez     Account Number:  [id number]     Admit date:  01/09/2014  Clinical Social Worker:  Marciano Sequin  Date/Time:  01/10/2014 12:18 PM  Referred by:  RN  Date Referred:  01/10/2014 Referred for  SNF Placement   Other Referral:   Interview type:  Patient Other interview type:    PSYCHOSOCIAL DATA Living Status:  ALONE Admitted from facility:   Level of care:   Primary support name:  Alexandra Martinez, [phone] Primary support relationship to patient:  FAMILY Degree of support available:   Strong Support    CURRENT CONCERNS  Other Concerns:    SOCIAL WORK ASSESSMENT / PLAN CSW met pt and pt's niece Alexandra Martinez [phone] by the bedside. CSW introduce self and purpose of visit. Pt presented with a normal affect and calm mood. CSW and pt discussed the clinical team recommendations for rehab. Pt reported that her and her sister has identified two place located in [place]. CSW explained the SNF process to the pt. CSW provided the pt with contact information for further questions. CSW will continue to follow this pt and assist with discharge as needed.   Assessment/plan status:  Information/Referral to [employer] Other assessment/ plan:   Information/referral to community resources:   SNF list    PATIENT'S/FAMILY'S RESPONSE TO PLAN OF CARE: agreed       Greta Doom, MSW, [place]

## 2014-01-10 NOTE — Progress Notes (Signed)
OT Cancellation Note  Patient Details Name: Alexandra Martinez MRN: [id number] DOB: 1929/04/21   Cancelled Treatment:    Reason Eval/Treat Not Completed: Other (comment) Pt is Medicare/Medicaid and current D/C plan is SNF. No apparent immediate acute care OT needs, therefore will defer OT to SNF. If OT eval is needed please call Acute Rehab Dept. at [phone] or text page OT at [phone].  [place], OTR/L  [phone] 01/10/2014 01/10/2014, 3:56 PM

## 2014-01-10 NOTE — Progress Notes (Signed)
Thank you for consult on Alexandra Martinez. Attempted to explain CIR to patient. She reports that her family has selected a facility for rehab and she would prefer to be close to home for therapy. Will defer CIR consult for now.

## 2014-01-11 LAB — TYPE AND SCREEN
ABO/RH(D): A POS
Antibody Screen: NEGATIVE
Unit division: 0
Unit division: 0
Unit division: 0

## 2014-01-11 LAB — HEMOGLOBIN AND HEMATOCRIT, BLOOD
HCT: 30.7 % — ABNORMAL LOW (ref 36.0–46.0)
Hemoglobin: 10.5 g/dL — ABNORMAL LOW (ref 12.0–15.0)

## 2014-01-11 NOTE — Clinical Social Work Note (Signed)
CSW is awaiting bed offers for the pt.   [place], MSW, [place]

## 2014-01-11 NOTE — Progress Notes (Signed)
Physical Therapy Treatment Patient Details Name: Alexandra Martinez MRN: [id number] DOB: 07-20-1929 Today's Date: 01/11/2014    History of Present Illness Pt is an 80 y.o. female presenting s/p L2-3, L4-5 decompression/fusion. Hx of paroxsymal afib, heart murmur, hypothryroidsim, hydronephrosis, recurrent UTI, GERD, hypercholestrolemia, DDD and cervical spine surgery.    PT Comments    Patient progressing today able to walk in hallway and feels working out some of the soreness, though still significantly painful with supine to sit.  RN medicated with pain pills after we got to edge of bed.  May need earlier pre-med for best pain management for therapy.  Follow Up Recommendations  SNF     Equipment Recommendations  Rolling walker with 5" wheels    Recommendations for Other Services       Precautions / Restrictions Precautions Precautions: Back;Fall Precaution Comments: Pt educated on technique (log roll, neutral spine for sidelying to sit) for bed mobility     Mobility  Bed Mobility   Bed Mobility: Rolling;Sidelying to Sit Rolling: Mod assist Sidelying to sit: Max assist;+2 for physical assistance       General bed mobility comments: increased time and cues for technique; limited due to pain  Transfers Overall transfer level: Needs assistance Equipment used: Rolling walker (2 wheeled) Transfers: Sit to/from Stand Sit to Stand: +2 physical assistance;Mod assist         General transfer comment: increased time and lifting help  Ambulation/Gait Ambulation/Gait assistance: Min assist Ambulation Distance (Feet): 90 Feet Assistive device: Rolling walker (2 wheeled) Gait Pattern/deviations: Decreased stride length;Step-through pattern;Shuffle   Gait velocity interpretation: <1.8 ft/sec, indicative of risk for recurrent falls General Gait Details: assist for multiple lines and cues for safety with walker   Stairs            Wheelchair Mobility    Modified  Rankin (Stroke Patients Only)       Balance Overall balance assessment: Needs assistance Sitting-balance support: Feet unsupported Sitting balance-Leahy Scale: Fair     Standing balance support: Bilateral upper extremity supported Standing balance-Leahy Scale: Poor Standing balance comment: UE assist for balance                    Cognition Arousal/Alertness: Awake/alert Behavior During Therapy: WFL for tasks assessed/performed Overall Cognitive Status: Within Functional Limits for tasks assessed                      Exercises      General Comments General comments (skin integrity, edema, etc.): BP 136/64 sitting edge of bed      Pertinent Vitals/Pain Pain Score: 8  Pain Location: back Pain Intervention(s): Premedicated before session    Home Living                      Prior Function            PT Goals (current goals can now be found in the care plan section) Progress towards PT goals: Progressing toward goals    Frequency  Min 5X/week    PT Plan Current plan remains appropriate    Co-evaluation             End of Session Equipment Utilized During Treatment: Gait belt Activity Tolerance: Patient limited by fatigue Patient left: in chair;with call bell/phone within reach     Time: 1020-1048 PT Time Calculation (min): 28 min  Charges:  $Gait Training: 8-22 mins $Therapeutic Activity: 8-22 mins  G Codes:      WYNN,CYNDI 01/11/2014, 12:11 PM Magda Kiel, [place] 01/11/2014

## 2014-01-11 NOTE — Progress Notes (Signed)
[name], female   DOB: 1930-02-07, 80 y.o.   MRN: [id number] Stable. No weakness. i will be out of town. My partners to help with her care

## 2014-01-11 NOTE — Progress Notes (Signed)
[name], female   DOB: 1929/09/26, 80 y.o.   MRN: [id number] C/o incisional pain. No weakness . Got 3 units of packed cells. Rehab to see

## 2014-01-12 NOTE — Progress Notes (Signed)
[name], female   DOB: [birth year], 80 y.o.   MRN: [id number] Vital signs are stable. Patient appears quite comfortable Awaiting placement in [facility] signed

## 2014-01-13 ENCOUNTER — Encounter: Payer: Self-pay | Admitting: Internal Medicine

## 2014-01-13 NOTE — Progress Notes (Signed)
[name], female   DOB: Jun 12, 1929, 80 y.o.   MRN: [id number] Patient appears comfortable. Need substantial help with ADLs Good candidate for SNF FL2 signed

## 2014-01-13 NOTE — Progress Notes (Signed)
CSW (Clinical Education officer, museum) received call from nursing inquiring if pt can dc to facility today. CSW spoke with facility and they informed CSW pt not in the system for weekend dc so they will be unable to accept pt today. CSW notified of likely dc tomorrow. Facility confirmed that weekday admissions staff will be notified.  [place], [place]

## 2014-01-14 MED ORDER — MUPIROCIN 2 % EX OINT
TOPICAL_OINTMENT | CUTANEOUS | Status: AC
  Filled 2014-01-14: qty 22

## 2014-01-14 MED ORDER — OXYCODONE-ACETAMINOPHEN 5-325 MG PO TABS: 1.0000 | ORAL_TABLET | ORAL | Status: DC | PRN

## 2014-01-14 NOTE — Progress Notes (Signed)
Visited with pt and pt's niece. Pt shared that sometimes she experiences a lot of pain, and she sometimes waits too long to ask for pain meds. She said she had been having back problems for several years but put off getting the surgery. Pt and niece both hope that after rehab pt will be able to resume life normally. Chaplain offered prayer.    01/14/14 1630  Clinical Encounter Type  Visited With Patient and family together  Visit Type Initial;Spiritual support  Referral From Nurse  Consult/Referral To [place], [place] 01/14/2014 4:47 PM

## 2014-01-14 NOTE — Discharge Summary (Signed)
Physician Discharge Summary  [name] MRN: [id number] DOB/AGE: 78-May-1931 78 y.o.  Admit date: 01/09/2014 Discharge date: 01/14/2014  Admission Diagnoses: Lumbar stenosis L2-3, spondylolisthesis L4-5, lumbar radiculopathy and neurogenic claudication  Discharge Diagnoses: Lumbar stenosis L2-3, lumbar spondylolisthesis L4-5, lumbar radiculopathy and neurogenic claudication Active Problems:   Lumbar degenerative disc disease   Discharged Condition: good  Hospital Course: Patient is an 80 year old individual who's had severe pain in her back with weakness in her legs and substantial radicular pain. She was found to have severe stenosis at L2-3 and a spondylolisthesis at L4-L5 and on 01/09/2014 she underwent surgical decompression and stabilization from L2-L5. Postoperatively she has felt generally weak she had some acute post operative anemia but she is gradually progressing in her strength. She is being transferred now to a skilled nursing facility.  Consults: None  Significant Diagnostic Studies: None  Treatments: Surgical laminectomy L2-L3 and L4-L5 decompression of nerve roots at L2 L3-L4 and L5 posterior segment. Posterior stabilization from L2-L5 posterior lateral arthrodesis L2-L5  Discharge Exam: Blood pressure 133/66, pulse 101, temperature 98 F (36.7 C), temperature source Oral, resp. rate 17, height 5\' 4"  (1.626 m), weight 77.5 kg (170 lb 13.7 oz), SpO2 96.00%. Incision is clean and dry. Motor function is 4/5 in iliopsoas quads tibialis anterior and gastrocs. Patient is ambulatory with a walker and significant cueing.  Disposition: Skilled nursing facility  Discharge Instructions   Diet - low sodium heart healthy    Complete by:  As directed      Increase activity slowly    Complete by:  As directed             Medication List         acetaminophen 500 MG tablet  Commonly known as:  TYLENOL  Take 500 mg by mouth every 6 (six) hours as needed for  headache.     ammonium lactate 12 % cream  Commonly known as:  AMLACTIN  Apply 1 g topically 2 (two) times daily.     Cranberry 450 MG Tabs  Take 450 mg by mouth 2 (two) times daily.     EQ GAS RELIEF 125 MG Caps  Generic drug:  Simethicone  Take 125 mg by mouth daily as needed (gas).     levothyroxine 50 MCG tablet  Commonly known as:  SYNTHROID, LEVOTHROID  Take 50 mcg by mouth daily before breakfast.     loratadine 10 MG tablet  Commonly known as:  CLARITIN  Take 10 mg by mouth daily.     metoprolol 100 MG tablet  Commonly known as:  LOPRESSOR  Take 100 mg by mouth 2 (two) times daily.     NEXIUM PO  Take 22.3 mg by mouth daily.     ondansetron 4 MG tablet  Commonly known as:  ZOFRAN  Take 1 tablet (4 mg total) by mouth every 8 (eight) hours as needed for nausea or vomiting.     oxyCODONE-acetaminophen 5-325 MG per tablet  Commonly known as:  PERCOCET/ROXICET  Take 1-2 tablets by mouth every 4 (four) hours as needed for moderate pain.     traMADol 50 MG tablet  Commonly known as:  ULTRAM  Take 50 mg by mouth every 8 (eight) hours as needed (pain).     XARELTO 15 MG Tabs tablet  Generic drug:  Rivaroxaban  Take 15 mg by mouth daily with supper.         SignedEarleen Newport 01/14/2014, 7:03 PM

## 2014-01-15 DIAGNOSIS — Z4889 Encounter for other specified surgical aftercare: Secondary | ICD-10-CM | POA: Diagnosis not present

## 2014-01-15 DIAGNOSIS — M4800 Spinal stenosis, site unspecified: Secondary | ICD-10-CM | POA: Diagnosis not present

## 2014-01-15 DIAGNOSIS — Z4789 Encounter for other orthopedic aftercare: Secondary | ICD-10-CM | POA: Diagnosis not present

## 2014-01-15 DIAGNOSIS — M4310 Spondylolisthesis, site unspecified: Secondary | ICD-10-CM | POA: Diagnosis not present

## 2014-01-15 DIAGNOSIS — M48062 Spinal stenosis, lumbar region with neurogenic claudication: Secondary | ICD-10-CM | POA: Diagnosis not present

## 2014-01-15 DIAGNOSIS — M4806 Spinal stenosis, lumbar region: Secondary | ICD-10-CM | POA: Diagnosis not present

## 2014-01-15 DIAGNOSIS — M549 Dorsalgia, unspecified: Secondary | ICD-10-CM | POA: Diagnosis not present

## 2014-01-15 DIAGNOSIS — IMO0002 Reserved for concepts with insufficient information to code with codable children: Secondary | ICD-10-CM | POA: Diagnosis not present

## 2014-01-15 DIAGNOSIS — I10 Essential (primary) hypertension: Secondary | ICD-10-CM | POA: Diagnosis not present

## 2014-01-15 DIAGNOSIS — J302 Other seasonal allergic rhinitis: Secondary | ICD-10-CM | POA: Diagnosis not present

## 2014-01-15 DIAGNOSIS — R269 Unspecified abnormalities of gait and mobility: Secondary | ICD-10-CM | POA: Diagnosis not present

## 2014-01-15 DIAGNOSIS — M6281 Muscle weakness (generalized): Secondary | ICD-10-CM | POA: Diagnosis not present

## 2014-01-15 DIAGNOSIS — M5416 Radiculopathy, lumbar region: Secondary | ICD-10-CM | POA: Diagnosis not present

## 2014-01-15 DIAGNOSIS — E039 Hypothyroidism, unspecified: Secondary | ICD-10-CM | POA: Diagnosis not present

## 2014-01-15 DIAGNOSIS — Z5189 Encounter for other specified aftercare: Secondary | ICD-10-CM | POA: Diagnosis not present

## 2014-01-15 DIAGNOSIS — K219 Gastro-esophageal reflux disease without esophagitis: Secondary | ICD-10-CM | POA: Diagnosis not present

## 2014-01-15 DIAGNOSIS — M4316 Spondylolisthesis, lumbar region: Secondary | ICD-10-CM | POA: Diagnosis not present

## 2014-01-15 DIAGNOSIS — R2689 Other abnormalities of gait and mobility: Secondary | ICD-10-CM | POA: Diagnosis not present

## 2014-01-15 NOTE — Progress Notes (Signed)
Pt d/c's to [facility] SNF accompanied by 2 EMS. VS stable, no s/s of acute distress noted. D/C paperwork sent with EMS.

## 2014-01-15 NOTE — Progress Notes (Addendum)
Received orders to D/C to [facility], pt aware of d/c. VS stable. Report called to nurse, Alger Simons, at [facility]. Transport has been set up for 1100. Pt's family aware.

## 2014-01-17 ENCOUNTER — Encounter: Payer: Self-pay | Admitting: Internal Medicine

## 2014-02-05 LAB — URINALYSIS, COMPLETE
Bacteria: NONE SEEN
Bilirubin,UR: NEGATIVE
Glucose,UR: NEGATIVE mg/dL (ref 0–75)
Ketone: NEGATIVE
Nitrite: NEGATIVE
Ph: 5 (ref 4.5–8.0)
Protein: 100
RBC,UR: 52 /HPF (ref 0–5)
Specific Gravity: 1.014 (ref 1.003–1.030)
Squamous Epithelial: 2
WBC UR: 2239 /HPF (ref 0–5)

## 2014-02-06 LAB — URINE CULTURE

## 2014-02-07 DIAGNOSIS — K219 Gastro-esophageal reflux disease without esophagitis: Secondary | ICD-10-CM | POA: Diagnosis not present

## 2014-02-07 DIAGNOSIS — E039 Hypothyroidism, unspecified: Secondary | ICD-10-CM | POA: Diagnosis not present

## 2014-02-07 DIAGNOSIS — Z4789 Encounter for other orthopedic aftercare: Secondary | ICD-10-CM | POA: Diagnosis not present

## 2014-02-10 ENCOUNTER — Other Ambulatory Visit: Payer: Self-pay | Admitting: Internal Medicine

## 2014-02-11 DIAGNOSIS — Z6826 Body mass index (BMI) 26.0-26.9, adult: Secondary | ICD-10-CM | POA: Diagnosis not present

## 2014-02-11 DIAGNOSIS — M4316 Spondylolisthesis, lumbar region: Secondary | ICD-10-CM | POA: Diagnosis not present

## 2014-02-11 DIAGNOSIS — M545 Low back pain: Secondary | ICD-10-CM | POA: Diagnosis not present

## 2014-02-12 DIAGNOSIS — K219 Gastro-esophageal reflux disease without esophagitis: Secondary | ICD-10-CM | POA: Diagnosis not present

## 2014-02-12 DIAGNOSIS — Z4789 Encounter for other orthopedic aftercare: Secondary | ICD-10-CM | POA: Diagnosis not present

## 2014-02-12 DIAGNOSIS — E039 Hypothyroidism, unspecified: Secondary | ICD-10-CM | POA: Diagnosis not present

## 2014-02-14 DIAGNOSIS — K219 Gastro-esophageal reflux disease without esophagitis: Secondary | ICD-10-CM | POA: Diagnosis not present

## 2014-02-14 DIAGNOSIS — Z4789 Encounter for other orthopedic aftercare: Secondary | ICD-10-CM | POA: Diagnosis not present

## 2014-02-14 DIAGNOSIS — E039 Hypothyroidism, unspecified: Secondary | ICD-10-CM | POA: Diagnosis not present

## 2014-02-15 DIAGNOSIS — E039 Hypothyroidism, unspecified: Secondary | ICD-10-CM | POA: Diagnosis not present

## 2014-02-15 DIAGNOSIS — Z4789 Encounter for other orthopedic aftercare: Secondary | ICD-10-CM | POA: Diagnosis not present

## 2014-02-15 DIAGNOSIS — K219 Gastro-esophageal reflux disease without esophagitis: Secondary | ICD-10-CM | POA: Diagnosis not present

## 2014-02-17 ENCOUNTER — Encounter: Payer: Self-pay | Admitting: Internal Medicine

## 2014-02-19 DIAGNOSIS — E039 Hypothyroidism, unspecified: Secondary | ICD-10-CM | POA: Diagnosis not present

## 2014-02-19 DIAGNOSIS — Z4789 Encounter for other orthopedic aftercare: Secondary | ICD-10-CM | POA: Diagnosis not present

## 2014-02-19 DIAGNOSIS — K219 Gastro-esophageal reflux disease without esophagitis: Secondary | ICD-10-CM | POA: Diagnosis not present

## 2014-02-20 ENCOUNTER — Encounter: Payer: Self-pay | Admitting: Internal Medicine

## 2014-02-20 ENCOUNTER — Telehealth: Payer: Self-pay | Admitting: Internal Medicine

## 2014-02-20 ENCOUNTER — Ambulatory Visit (INDEPENDENT_AMBULATORY_CARE_PROVIDER_SITE_OTHER): Payer: Medicare Other | Admitting: Internal Medicine

## 2014-02-20 VITALS — BP 148/84 | HR 84 | Temp 97.8°F | Resp 16 | Ht 63.0 in | Wt 148.5 lb

## 2014-02-20 DIAGNOSIS — D62 Acute posthemorrhagic anemia: Secondary | ICD-10-CM

## 2014-02-20 DIAGNOSIS — E0789 Other specified disorders of thyroid: Secondary | ICD-10-CM | POA: Diagnosis not present

## 2014-02-20 DIAGNOSIS — G8929 Other chronic pain: Secondary | ICD-10-CM

## 2014-02-20 DIAGNOSIS — E034 Atrophy of thyroid (acquired): Secondary | ICD-10-CM

## 2014-02-20 DIAGNOSIS — E46 Unspecified protein-calorie malnutrition: Secondary | ICD-10-CM | POA: Diagnosis not present

## 2014-02-20 DIAGNOSIS — Z981 Arthrodesis status: Secondary | ICD-10-CM | POA: Diagnosis not present

## 2014-02-20 DIAGNOSIS — E038 Other specified hypothyroidism: Secondary | ICD-10-CM | POA: Diagnosis not present

## 2014-02-20 DIAGNOSIS — M545 Low back pain: Secondary | ICD-10-CM | POA: Diagnosis not present

## 2014-02-20 DIAGNOSIS — R634 Abnormal weight loss: Secondary | ICD-10-CM

## 2014-02-20 NOTE — Progress Notes (Signed)
[name], female   DOB: 1929/06/13, 80 y.o.   MRN: [id number]  Patient Active Problem List   Diagnosis Date Noted  . Status post laminectomy with spinal fusion 02/23/2014  . Loss of weight 02/23/2014  . Protein-calorie malnutrition 02/23/2014  . Lumbar degenerative disc disease 01/09/2014  . Other malaise and fatigue 07/07/2013  . Dysuria 12/22/2012  . Pruritic condition 12/03/2012  . Postoperative anemia due to acute blood loss 09/10/2012  . Dyspnea on exertion 08/12/2012  . Chronic lower back pain 08/12/2012  . Urge urinary incontinence 08/12/2012  . Personal history of colonic polyps 08/12/2012  . Seasonal rhinitis 01/16/2012  . Hiatal hernia with gastroesophageal reflux   . Controlled atrial fibrillation 12/15/2010  . Irritable bowel syndrome without diarrhea 12/15/2010  . Hypothyroid 12/15/2010  . Long term (current) use of anticoagulants 12/15/2010    Subjective:  CC:   Chief Complaint  Patient presents with  . Follow-up    for after surgery and rehab after 22 days in rehab.    HPI:   Alexandra Martinez is a 80 y.o. female who presents for follow up on chronic issues including chronic abdominal pain, hypothyroidisim, and DDD lumbar spine  s/p recent elective surgery for decompression on sept 23 for severe stenosis.  The surgery was complicated by acute blood loss due to inadequate suspension of Xarelto (she was told to stop the Xarelto  for 3 days by the neurosurgeons office instead of 7 days and hgb dropped to 6.9  post operatively)    S.p rehab x 22 days,  Now staying with niece who has accompanied here today,    She is using a walker to ambulate.  Still using  oxycodone  Every 4 to 8 hours for relief of pain,  Not constipated.  No falls.  Appetite slow to return  Weight loss acknowledged.    Past Medical History  Diagnosis Date  . Hypothyroidism   . Osteoporosis   . Recurrent UTI   . GERD (gastroesophageal reflux disease)   . DDD (degenerative  disc disease)   . Atrial fib/flutter, transient     [facility] admission for RVR 03/2009  . Hiatal hernia with gastroesophageal reflux 2008    by EGD,  [name]  . High cholesterol   . H/O hiatal hernia   . Hemorrhoids   . Nocturia   . Polyuria   . H/O bladder infections   . History of uterine fibroid   . Anemia     r/t uterine fibroids  . H/O thyroid nodule     surgically removed  . Hypertension     [facility]  . Heart murmur     moderate MR/TR 08/2013 echo [name])  . Hydronephrosis     uretero- with congenital UPJ obstruction- Dr. Edrick Oh  . Hydronephrosis with ureteropelvic junction obstruction     Past Surgical History  Procedure Laterality Date  . Cataract extraction      left  . Cholecystectomy    . Cervical disc surgery  1980's  . Thyroid lobectomy      partial, right lobe  . Total abdominal hysterectomy w/ bilateral salpingoophorectomy         The following portions of the patient's history were reviewed and updated as appropriate: Allergies, current medications, and problem list.    Review of Systems:   Patient denies headache, fevers, malaise, , skin rash, eye pain, sinus congestion and sinus pain, sore throat, dysphagia,  hemoptysis , cough, dyspnea, wheezing, chest  pain, palpitations, orthopnea, edema, abdominal pain, nausea, melena, diarrhea, constipation, flank pain, dysuria, hematuria, urinary  Frequency, nocturia, numbness, tingling, seizures,  Focal weakness, Loss of consciousness,  Tremor, insomnia, depression, anxiety, and suicidal ideation.     History   Social History  . Marital Status: Widowed    Spouse Name: N/A    Number of Children: N/A  . Years of Education: N/A   Occupational History  . Not on file.   Social History Main Topics  . Smoking status: Never Smoker   . Smokeless tobacco: Never Used  . Alcohol Use: No  . Drug Use: No  . Sexual Activity: No   Other Topics Concern  . Not on file   Social History Narrative    Lives alone, widowed in 2005.   Husband was a smoker.    Objective:  Filed Vitals:   02/20/14 1530  BP: 148/84  Pulse: 84  Temp: 97.8 F (36.6 C)  Resp: 16     General appearance: alert, cooperative and appears stated age Ears: normal TM's and external ear canals both ears Throat: lips, mucosa, and tongue normal; teeth and gums normal Neck: no adenopathy, no carotid bruit, supple, symmetrical, trachea midline and thyroid not enlarged, symmetric, no tenderness/mass/nodules Back: symmetric, no curvature. ROM normal. No CVA tenderness. Lungs: clear to auscultation bilaterally Heart: regular rate and rhythm, S1, S2 normal, no murmur, click, rub or gallop Abdomen: soft, non-tender; bowel sounds normal; no masses,  no organomegaly Pulses: 2+ and symmetric Skin: Skin color, texture, turgor normal. No rashes or lesions MSK:  Surgical scar well healed,  No redness or tenderness Lymph nodes: Cervical, supraclavicular, and axillary nodes normal.  Assessment and Plan:  Status post laminectomy with spinal fusion Done electively Sept 23 at [facility]  For releif of pain and bilateral lower extremity weakness secondry to spinal stenosis..  Patient required several weeks of rehab and is now ambulating with walker, and livign with niece,  She is still requiring use of oxycodone 2 to 3 times daily for pain ,  Bowel regimen reviewed   Postoperative anemia due to acute blood loss Secondary to use of Xarelto which was not suspended until 3 days prior to lumbar decompression.  She was transfused for hgb 6.9.   Discharge hgb noted  Repeat hgb has improved slightly.  Will avoid iron at this time to avoid aggravating constipation   CBC Latest Ref Rng 02/20/2014 01/11/2014 01/10/2014  WBC 4.0 - 10.5 K/uL 6.8 - 8.8  Hemoglobin 12.0 - 15.0 g/dL 11.0(L) 10.5(L) 6.9(LL)  Hematocrit 36.0 - 46.0 % 33.0(L) 30.7(L) 20.5(L)  Platelets 150.0 - 400.0 K/uL 190.0 - 99(L)     Chronic lower back pain Secondary to  worsening degenerative changes from L2 to L5,  With spinal stenosis noted on recent MRI,  S/p decompression and laminectomy with cage,  Screws, etc Sept 23 2015 by Leeroy Cha.  Still requring oxycodone  Daily for pain control 5 weeks post operatively.   Loss of weight Postoperative weight loss due to loss of appetite may be due to use of oxycodone..  Discussed adding appetite stimulant and weaning off of oxycodone.   Lab Results  Component Value Date   ALT 12 02/20/2014   AST 19 02/20/2014   ALKPHOS 81 02/20/2014   BILITOT 0.7 02/20/2014     Protein-calorie malnutrition Mild, occurring post operatively secondary to pain and use of oxycodone.  Advised to supplement diet with Ensure ,  Boost or other protein shake.  Will repeat  in 3 months  Lab Results  Component Value Date   LABPROT 17.4* 07/16/2013     A total of 40 minutes was spent with patient more than half of which was spent in counseling patient on the above mentioned issues , reviewing and explaining recent labs and imaging studies done, and coordination of care.  Updated Medication List Outpatient Encounter Prescriptions as of 02/20/2014  Medication Sig  . acetaminophen (TYLENOL) 500 MG tablet Take 500 mg by mouth every 6 (six) hours as needed for headache.  Marland Kitchen ammonium lactate (AMLACTIN) 12 % cream Apply 1 g topically 2 (two) times daily.  . Esomeprazole Magnesium (NEXIUM PO) Take 22.3 mg by mouth daily.  Marland Kitchen levothyroxine (SYNTHROID, LEVOTHROID) 50 MCG tablet Take 50 mcg by mouth daily before breakfast.  . loratadine (CLARITIN) 10 MG tablet Take 10 mg by mouth daily.  . metoprolol (LOPRESSOR) 100 MG tablet Take 100 mg by mouth 2 (two) times daily.  . ondansetron (ZOFRAN) 4 MG tablet Take 1 tablet (4 mg total) by mouth every 8 (eight) hours as needed for nausea or vomiting.  Marland Kitchen oxyCODONE-acetaminophen (PERCOCET/ROXICET) 5-325 MG per tablet Take 1-2 tablets by mouth every 4 (four) hours as needed for moderate pain.  .  OXYCONTIN 10 MG T12A 12 hr tablet Take 1 tablet by mouth 2 (two) times daily as needed.  . Rivaroxaban (XARELTO) 15 MG TABS tablet Take 15 mg by mouth daily with supper.   . Simethicone (EQ GAS RELIEF) 125 MG CAPS Take 125 mg by mouth daily as needed (gas).  . traMADol (ULTRAM) 50 MG tablet Take 50 mg by mouth every 8 (eight) hours as needed (pain).  . Cranberry 450 MG TABS Take 450 mg by mouth 2 (two) times daily.  . [DISCONTINUED] metoprolol (LOPRESSOR) 100 MG tablet TAKE 1 TABLET BY MOUTH TWICE DAILY     Orders Placed This Encounter  Procedures  . CBC with Differential  . Ferritin  . IBC panel  . Comp Met (CMET)  . Prealbumin  . T4 AND TSH    Return in about 3 months (around 05/23/2014).

## 2014-02-20 NOTE — Patient Instructions (Signed)
Iron Deficiency Anemia Anemia is a condition in which there are less red blood cells or hemoglobin in the blood than normal. Hemoglobin is the part of red blood cells that carries oxygen. Iron deficiency anemia is anemia caused by too little iron. It is the most common type of anemia. It may leave you tired and short of breath. CAUSES   Lack of iron in the diet.  Poor absorption of iron, as seen with intestinal disorders.  Intestinal bleeding.  Heavy periods. SIGNS AND SYMPTOMS  Mild anemia may not be noticeable. Symptoms may include:  Fatigue.  Headache.  Pale skin.  Weakness.  Tiredness.  Shortness of breath.  Dizziness.  Cold hands and feet.  Fast or irregular heartbeat. DIAGNOSIS  Diagnosis requires a thorough evaluation and physical exam by your health care provider. Blood tests are generally used to confirm iron deficiency anemia. Additional tests may be done to find the underlying cause of your anemia. These may include:  Testing for blood in the stool (fecal occult blood test).  A procedure to see inside the colon and rectum (colonoscopy).  A procedure to see inside the esophagus and stomach (endoscopy). TREATMENT  Iron deficiency anemia is treated by correcting the cause of the deficiency. Treatment may involve:  Adding iron-rich foods to your diet.  Taking iron supplements. Pregnant or breastfeeding women need to take extra iron because their normal diet usually does not provide the required amount.  Taking vitamins. Vitamin C improves the absorption of iron. Your health care provider may recommend that you take your iron tablets with a glass of orange juice or vitamin C supplement.  Medicines to make heavy menstrual flow lighter.  Surgery. HOME CARE INSTRUCTIONS   Take iron as directed by your health care provider.  If you cannot tolerate taking iron supplements by mouth, talk to your health care provider about taking them through a vein  (intravenously) or an injection into a muscle.  For the best iron absorption, iron supplements should be taken on an empty stomach. If you cannot tolerate them on an empty stomach, you may need to take them with food.  Do not drink milk or take antacids at the same time as your iron supplements. Milk and antacids may interfere with the absorption of iron.  Iron supplements can cause constipation. Make sure to include fiber in your diet to prevent constipation. A stool softener may also be recommended.  Take vitamins as directed by your health care provider.  Eat a diet rich in iron. Foods high in iron include liver, lean beef, whole-grain bread, eggs, dried fruit, and dark green leafy vegetables. SEEK IMMEDIATE MEDICAL CARE IF:   You faint. If this happens, do not drive. Call your local emergency services (911 in [place] if no other help is available.  You have chest pain.  You feel nauseous or vomit.  You have severe or increased shortness of breath with activity.  You feel weak.  You have a rapid heartbeat.  You have unexplained sweating.  You become light-headed when getting up from a chair or bed. MAKE SURE YOU:   Understand these instructions.  Will watch your condition.  Will get help right away if you are not doing well or get worse. Document Released: 04/02/2000 Document Revised: 04/10/2013 Document Reviewed: 12/11/2012 ExitCare Patient Information 2015 [employer]. This information is not intended to replace advice given to you by your health care provider. Make sure you discuss any questions you have with your health care provider.  

## 2014-02-20 NOTE — Telephone Encounter (Signed)
Pt has granted niece [name] okay to receive lab results/msn

## 2014-02-21 DIAGNOSIS — Z4789 Encounter for other orthopedic aftercare: Secondary | ICD-10-CM | POA: Diagnosis not present

## 2014-02-21 DIAGNOSIS — K219 Gastro-esophageal reflux disease without esophagitis: Secondary | ICD-10-CM | POA: Diagnosis not present

## 2014-02-21 DIAGNOSIS — E039 Hypothyroidism, unspecified: Secondary | ICD-10-CM | POA: Diagnosis not present

## 2014-02-21 LAB — CBC WITH DIFFERENTIAL/PLATELET
Basophils Absolute: 0 10*3/uL (ref 0.0–0.1)
Basophils Relative: 0.7 % (ref 0.0–3.0)
Eosinophils Absolute: 0.3 10*3/uL (ref 0.0–0.7)
Eosinophils Relative: 3.7 % (ref 0.0–5.0)
HCT: 33 % — ABNORMAL LOW (ref 36.0–46.0)
Hemoglobin: 11 g/dL — ABNORMAL LOW (ref 12.0–15.0)
Lymphocytes Relative: 19.2 % (ref 12.0–46.0)
Lymphs Abs: 1.3 10*3/uL (ref 0.7–4.0)
MCHC: 33.2 g/dL (ref 30.0–36.0)
MCV: 90.6 fl (ref 78.0–100.0)
Monocytes Absolute: 0.5 10*3/uL (ref 0.1–1.0)
Monocytes Relative: 7.6 % (ref 3.0–12.0)
Neutro Abs: 4.7 10*3/uL (ref 1.4–7.7)
Neutrophils Relative %: 68.8 % (ref 43.0–77.0)
Platelets: 190 10*3/uL (ref 150.0–400.0)
RBC: 3.65 Mil/uL — ABNORMAL LOW (ref 3.87–5.11)
RDW: 14.7 % (ref 11.5–15.5)
WBC: 6.8 10*3/uL (ref 4.0–10.5)

## 2014-02-21 LAB — COMPREHENSIVE METABOLIC PANEL
ALT: 12 U/L (ref 0–35)
AST: 19 U/L (ref 0–37)
Albumin: 3.3 g/dL — ABNORMAL LOW (ref 3.5–5.2)
Alkaline Phosphatase: 81 U/L (ref 39–117)
BUN: 15 mg/dL (ref 6–23)
CO2: 25 mEq/L (ref 19–32)
Calcium: 9.3 mg/dL (ref 8.4–10.5)
Chloride: 105 mEq/L (ref 96–112)
Creatinine, Ser: 1 mg/dL (ref 0.4–1.2)
GFR: 57.09 mL/min — ABNORMAL LOW (ref >60.00)
Glucose, Bld: 87 mg/dL (ref 70–99)
Potassium: 4.5 mEq/L (ref 3.5–5.1)
Sodium: 139 mEq/L (ref 135–145)
Total Bilirubin: 0.7 mg/dL (ref 0.2–1.2)
Total Protein: 7 g/dL (ref 6.0–8.3)

## 2014-02-21 LAB — PREALBUMIN: Prealbumin: 17.6 mg/dL (ref 17.0–34.0)

## 2014-02-21 LAB — T4 AND TSH
T4, Total: 8.6 ug/dL (ref 4.5–12.0)
TSH: 3.79 u[IU]/mL (ref 0.450–4.500)

## 2014-02-21 LAB — FERRITIN: Ferritin: 148.5 ng/mL (ref 10.0–291.0)

## 2014-02-21 LAB — IBC PANEL
Iron: 43 ug/dL (ref 42–145)
Saturation Ratios: 14.8 % — ABNORMAL LOW (ref 20.0–50.0)
Transferrin: 207.1 mg/dL — ABNORMAL LOW (ref 212.0–360.0)

## 2014-02-23 DIAGNOSIS — Z981 Arthrodesis status: Secondary | ICD-10-CM | POA: Insufficient documentation

## 2014-02-23 DIAGNOSIS — R634 Abnormal weight loss: Secondary | ICD-10-CM | POA: Insufficient documentation

## 2014-02-23 DIAGNOSIS — E46 Unspecified protein-calorie malnutrition: Secondary | ICD-10-CM | POA: Insufficient documentation

## 2014-02-23 NOTE — Assessment & Plan Note (Signed)
Mild, occurring post operatively secondary to pain and use of oxycodone.  Advised to supplement diet with Ensure ,  Boost or other protein shake.  Will repeat in 3 months  Lab Results  Component Value Date   LABPROT 17.4* 07/16/2013

## 2014-02-23 NOTE — Assessment & Plan Note (Signed)
Postoperative weight loss due to loss of appetite may be due to use of oxycodone..  Discussed adding appetite stimulant and weaning off of oxycodone.   Lab Results  Component Value Date   ALT 12 02/20/2014   AST 19 02/20/2014   ALKPHOS 81 02/20/2014   BILITOT 0.7 02/20/2014

## 2014-02-23 NOTE — Assessment & Plan Note (Signed)
Done electively Sept 23 at [facility]  For releif of pain and bilateral lower extremity weakness secondry to spinal stenosis..  Patient required several weeks of rehab and is now ambulating with walker, and livign with niece,  She is still requiring use of oxycodone 2 to 3 times daily for pain ,  Bowel regimen reviewed

## 2014-02-23 NOTE — Assessment & Plan Note (Signed)
Secondary to use of Xarelto which was not suspended until 3 days prior to lumbar decompression.  She was transfused for hgb 6.9.   Discharge hgb noted  Repeat hgb has improved slightly.  Will avoid iron at this time to avoid aggravating constipation   CBC Latest Ref Rng 02/20/2014 01/11/2014 01/10/2014  WBC 4.0 - 10.5 K/uL 6.8 - 8.8  Hemoglobin 12.0 - 15.0 g/dL 11.0(L) 10.5(L) 6.9(LL)  Hematocrit 36.0 - 46.0 % 33.0(L) 30.7(L) 20.5(L)  Platelets 150.0 - 400.0 K/uL 190.0 - 99(L)

## 2014-02-23 NOTE — Assessment & Plan Note (Signed)
Secondary to worsening degenerative changes from L2 to L5,  With spinal stenosis noted on recent MRI,  S/p decompression and laminectomy with cage,  Screws, etc Sept 23 2015 by Leeroy Cha.  Still requring oxycodone  Daily for pain control 5 weeks post operatively.

## 2014-02-25 DIAGNOSIS — K219 Gastro-esophageal reflux disease without esophagitis: Secondary | ICD-10-CM | POA: Diagnosis not present

## 2014-02-25 DIAGNOSIS — E039 Hypothyroidism, unspecified: Secondary | ICD-10-CM | POA: Diagnosis not present

## 2014-02-25 DIAGNOSIS — Z4789 Encounter for other orthopedic aftercare: Secondary | ICD-10-CM | POA: Diagnosis not present

## 2014-02-28 DIAGNOSIS — Z4789 Encounter for other orthopedic aftercare: Secondary | ICD-10-CM | POA: Diagnosis not present

## 2014-02-28 DIAGNOSIS — E039 Hypothyroidism, unspecified: Secondary | ICD-10-CM | POA: Diagnosis not present

## 2014-02-28 DIAGNOSIS — K219 Gastro-esophageal reflux disease without esophagitis: Secondary | ICD-10-CM | POA: Diagnosis not present

## 2014-03-05 DIAGNOSIS — K219 Gastro-esophageal reflux disease without esophagitis: Secondary | ICD-10-CM | POA: Diagnosis not present

## 2014-03-05 DIAGNOSIS — E039 Hypothyroidism, unspecified: Secondary | ICD-10-CM | POA: Diagnosis not present

## 2014-03-05 DIAGNOSIS — Z4789 Encounter for other orthopedic aftercare: Secondary | ICD-10-CM | POA: Diagnosis not present

## 2014-03-07 DIAGNOSIS — M4316 Spondylolisthesis, lumbar region: Secondary | ICD-10-CM | POA: Diagnosis not present

## 2014-03-07 DIAGNOSIS — Z6825 Body mass index (BMI) 25.0-25.9, adult: Secondary | ICD-10-CM | POA: Diagnosis not present

## 2014-03-08 DIAGNOSIS — E039 Hypothyroidism, unspecified: Secondary | ICD-10-CM | POA: Diagnosis not present

## 2014-03-08 DIAGNOSIS — K219 Gastro-esophageal reflux disease without esophagitis: Secondary | ICD-10-CM | POA: Diagnosis not present

## 2014-03-08 DIAGNOSIS — Z4789 Encounter for other orthopedic aftercare: Secondary | ICD-10-CM | POA: Diagnosis not present

## 2014-03-19 ENCOUNTER — Encounter: Payer: Self-pay | Admitting: Internal Medicine

## 2014-04-02 DIAGNOSIS — M545 Low back pain: Secondary | ICD-10-CM | POA: Diagnosis not present

## 2014-04-05 DIAGNOSIS — M545 Low back pain: Secondary | ICD-10-CM | POA: Diagnosis not present

## 2014-04-08 DIAGNOSIS — M545 Low back pain: Secondary | ICD-10-CM | POA: Diagnosis not present

## 2014-04-10 DIAGNOSIS — M545 Low back pain: Secondary | ICD-10-CM | POA: Diagnosis not present

## 2014-04-14 DIAGNOSIS — N3001 Acute cystitis with hematuria: Secondary | ICD-10-CM | POA: Diagnosis not present

## 2014-04-14 DIAGNOSIS — K5909 Other constipation: Secondary | ICD-10-CM | POA: Diagnosis not present

## 2014-04-14 DIAGNOSIS — R110 Nausea: Secondary | ICD-10-CM | POA: Diagnosis not present

## 2014-04-14 DIAGNOSIS — R350 Frequency of micturition: Secondary | ICD-10-CM | POA: Diagnosis not present

## 2014-04-15 DIAGNOSIS — M545 Low back pain: Secondary | ICD-10-CM | POA: Diagnosis not present

## 2014-04-17 DIAGNOSIS — M545 Low back pain: Secondary | ICD-10-CM | POA: Diagnosis not present

## 2014-04-19 ENCOUNTER — Encounter: Payer: Self-pay | Admitting: Internal Medicine

## 2014-04-19 DIAGNOSIS — M545 Low back pain: Secondary | ICD-10-CM | POA: Diagnosis not present

## 2014-04-23 DIAGNOSIS — M545 Low back pain: Secondary | ICD-10-CM | POA: Diagnosis not present

## 2014-04-25 DIAGNOSIS — M545 Low back pain: Secondary | ICD-10-CM | POA: Diagnosis not present

## 2014-04-29 DIAGNOSIS — K219 Gastro-esophageal reflux disease without esophagitis: Secondary | ICD-10-CM | POA: Diagnosis not present

## 2014-04-29 DIAGNOSIS — I38 Endocarditis, valve unspecified: Secondary | ICD-10-CM | POA: Diagnosis not present

## 2014-04-29 DIAGNOSIS — I482 Chronic atrial fibrillation: Secondary | ICD-10-CM | POA: Diagnosis not present

## 2014-04-29 DIAGNOSIS — M545 Low back pain: Secondary | ICD-10-CM | POA: Diagnosis not present

## 2014-05-01 DIAGNOSIS — M545 Low back pain: Secondary | ICD-10-CM | POA: Diagnosis not present

## 2014-05-06 DIAGNOSIS — M545 Low back pain: Secondary | ICD-10-CM | POA: Diagnosis not present

## 2014-05-09 DIAGNOSIS — M545 Low back pain: Secondary | ICD-10-CM | POA: Diagnosis not present

## 2014-05-16 DIAGNOSIS — M545 Low back pain: Secondary | ICD-10-CM | POA: Diagnosis not present

## 2014-05-20 DIAGNOSIS — M545 Low back pain: Secondary | ICD-10-CM | POA: Diagnosis not present

## 2014-05-21 DIAGNOSIS — M545 Low back pain: Secondary | ICD-10-CM | POA: Diagnosis not present

## 2014-05-23 DIAGNOSIS — M545 Low back pain: Secondary | ICD-10-CM | POA: Diagnosis not present

## 2014-05-27 ENCOUNTER — Ambulatory Visit: Payer: Medicare Other | Admitting: Internal Medicine

## 2014-05-28 ENCOUNTER — Ambulatory Visit (INDEPENDENT_AMBULATORY_CARE_PROVIDER_SITE_OTHER): Payer: Medicare Other | Admitting: Nurse Practitioner

## 2014-05-28 ENCOUNTER — Encounter: Payer: Self-pay | Admitting: Nurse Practitioner

## 2014-05-28 VITALS — BP 130/68 | HR 73 | Temp 97.8°F | Resp 14 | Ht 63.0 in | Wt 145.4 lb

## 2014-05-28 DIAGNOSIS — J069 Acute upper respiratory infection, unspecified: Secondary | ICD-10-CM | POA: Diagnosis not present

## 2014-05-28 DIAGNOSIS — M545 Low back pain: Secondary | ICD-10-CM | POA: Diagnosis not present

## 2014-05-28 NOTE — Progress Notes (Signed)
Pre visit review using our clinic review tool, if applicable. No additional management support is needed unless otherwise documented below in the visit note. 

## 2014-05-28 NOTE — Patient Instructions (Addendum)
Your cough may be coming from reflux, allergies, or post nasal drip (PND).  PND and allergies can be treated with benadryl, Allegra, Zyrtec or claritin.  Add Sudafed PE as needed for congestion.   "DM" stands for dextromethorphan which is a cough suppressant. The best/strongest available OTC cough suppressant is Delsym.  Consider using simply Saline to flush your sinuses twice daily when you have congestion to prevent sinus infections   Call us if worsening or failing to improve.

## 2014-05-28 NOTE — Progress Notes (Signed)
   Subjective:    [name], female    DOB: January 15, 1930, 81 y.o.   MRN: [id number]  HPI  [name] is a 81 yo female with a CC of congestion, sinus pressure, and cough. Pt unable to give a thorough history below is what I have obtained.   1) Since September, light yellow/green phlegm, occasionally has episodes of coughing, maxillary pressure. Pt complaining of a red throat.   Claritin- not taking daily   Miralax- last week last dose   PT twice a week, no appetite or energy- since the back surgery   Review of Systems  Constitutional: Negative for fever, chills, diaphoresis and fatigue.  HENT: Positive for congestion, rhinorrhea, sinus pressure and sore throat. Negative for ear discharge, ear pain, postnasal drip and sneezing.   Eyes: Negative for visual disturbance.  Respiratory: Positive for cough. Negative for chest tightness, shortness of breath and wheezing.   Cardiovascular: Negative for chest pain, palpitations and leg swelling.  Gastrointestinal: Negative for nausea, vomiting and diarrhea.  Skin: Negative for rash.  Neurological: Negative for dizziness, weakness, numbness and headaches.  Psychiatric/Behavioral: The patient is not nervous/anxious.        Objective:   Physical Exam  Constitutional: She is oriented to person, place, and time. She appears well-developed and well-nourished. No distress.  BP 130/68 mmHg  Pulse 73  Temp(Src) 97.8 F (36.6 C) (Oral)  Resp 14  Ht 5\' 3"  (1.6 m)  Wt 145 lb 6.4 oz (65.953 kg)  BMI 25.76 kg/m2  SpO2 98%   HENT:  Head: Normocephalic and atraumatic.  Right Ear: External ear normal.  Left Ear: External ear normal.  Mouth/Throat: No oropharyngeal exudate.  Maxillary tenderness, TM's clear bilaterally  Eyes: Conjunctivae and EOM are normal. Pupils are equal, round, and reactive to light. Right eye exhibits no discharge. Left eye exhibits no discharge. No scleral icterus.  Neck: Normal range of motion. Neck  supple. No thyromegaly present.  Cardiovascular: Normal rate, regular rhythm, normal heart sounds and intact distal pulses.  Exam reveals no gallop and no friction rub.   No murmur heard. Pulmonary/Chest: Effort normal and breath sounds normal. No respiratory distress. She has no wheezes. She has no rales. She exhibits no tenderness.  Lymphadenopathy:    She has no cervical adenopathy.  Neurological: She is alert and oriented to person, place, and time. No cranial nerve deficit. She exhibits normal muscle tone. Coordination normal.  Skin: Skin is warm and dry. No rash noted. She is not diaphoretic.  Psychiatric: She has a normal mood and affect. Her behavior is normal. Judgment and thought content normal.       Assessment & Plan:

## 2014-05-29 DIAGNOSIS — J069 Acute upper respiratory infection, unspecified: Secondary | ICD-10-CM | POA: Insufficient documentation

## 2014-05-29 NOTE — Assessment & Plan Note (Signed)
Asked pt to try benadryl at night, OTC allergy medication during the day, sudafed for nasal congestion, and delsym for cough. Call worsening or failure to improve.

## 2014-05-30 DIAGNOSIS — M545 Low back pain: Secondary | ICD-10-CM | POA: Diagnosis not present

## 2014-06-04 DIAGNOSIS — M545 Low back pain: Secondary | ICD-10-CM | POA: Diagnosis not present

## 2014-06-06 DIAGNOSIS — M4316 Spondylolisthesis, lumbar region: Secondary | ICD-10-CM | POA: Diagnosis not present

## 2014-06-18 ENCOUNTER — Other Ambulatory Visit: Payer: Self-pay | Admitting: Internal Medicine

## 2014-06-19 ENCOUNTER — Encounter
Admit: 2014-06-19 | Disposition: A | Discharge status: Home / Self Care | Payer: Self-pay | Attending: Neurosurgery | Admitting: Neurosurgery

## 2014-06-19 DIAGNOSIS — M545 Low back pain: Secondary | ICD-10-CM | POA: Diagnosis not present

## 2014-06-19 DIAGNOSIS — R262 Difficulty in walking, not elsewhere classified: Secondary | ICD-10-CM | POA: Diagnosis not present

## 2014-06-19 DIAGNOSIS — M4316 Spondylolisthesis, lumbar region: Secondary | ICD-10-CM | POA: Diagnosis not present

## 2014-06-19 NOTE — Telephone Encounter (Signed)
Ok refill? 

## 2014-06-19 NOTE — Telephone Encounter (Signed)
90 day supply authorized and sent   

## 2014-06-25 DIAGNOSIS — M545 Low back pain: Secondary | ICD-10-CM | POA: Diagnosis not present

## 2014-06-25 DIAGNOSIS — M4316 Spondylolisthesis, lumbar region: Secondary | ICD-10-CM | POA: Diagnosis not present

## 2014-06-25 DIAGNOSIS — R262 Difficulty in walking, not elsewhere classified: Secondary | ICD-10-CM | POA: Diagnosis not present

## 2014-06-28 DIAGNOSIS — R262 Difficulty in walking, not elsewhere classified: Secondary | ICD-10-CM | POA: Diagnosis not present

## 2014-06-28 DIAGNOSIS — M545 Low back pain: Secondary | ICD-10-CM | POA: Diagnosis not present

## 2014-06-28 DIAGNOSIS — M4316 Spondylolisthesis, lumbar region: Secondary | ICD-10-CM | POA: Diagnosis not present

## 2014-07-01 DIAGNOSIS — M4316 Spondylolisthesis, lumbar region: Secondary | ICD-10-CM | POA: Diagnosis not present

## 2014-07-01 DIAGNOSIS — M545 Low back pain: Secondary | ICD-10-CM | POA: Diagnosis not present

## 2014-07-01 DIAGNOSIS — R262 Difficulty in walking, not elsewhere classified: Secondary | ICD-10-CM | POA: Diagnosis not present

## 2014-07-03 DIAGNOSIS — M545 Low back pain: Secondary | ICD-10-CM | POA: Diagnosis not present

## 2014-07-03 DIAGNOSIS — R262 Difficulty in walking, not elsewhere classified: Secondary | ICD-10-CM | POA: Diagnosis not present

## 2014-07-03 DIAGNOSIS — M4316 Spondylolisthesis, lumbar region: Secondary | ICD-10-CM | POA: Diagnosis not present

## 2014-07-04 ENCOUNTER — Telehealth: Payer: Self-pay | Admitting: Internal Medicine

## 2014-07-04 NOTE — Telephone Encounter (Signed)
[facility] Patient Name: [name] DOB: 1930-01-09 Initial Comment caller states she has congestion Nurse Assessment Nurse: Donalynn Furlong, RN, Myna Hidalgo Date/Time [name] Time): 07/04/2014 11:53:38 AM Confirm and document reason for call. If symptomatic, describe symptoms. ---caller states she has congestion, cold, and sore throat. Thermometer broken Has the patient traveled out of the country within the last 30 days? ---No Does the patient require triage? ---Yes Related visit to physician within the last 2 weeks? ---No Does the PT have any chronic conditions? (i.e. diabetes, asthma, etc.) ---Yes List chronic conditions. ---high blood pressure, afib. Guidelines Guideline Title Affirmed Question Affirmed Notes Common Cold [1] Sinus congestion (pressure, fullness) AND [2] present > 10 days Final Disposition User See PCP When Office is Open (within 3 days) Donalynn Furlong, RN, Myna Hidalgo Comments TC back to pt to update regarding appt with Lorane Gell 3/18 at 1:30 pm

## 2014-07-04 NOTE — Telephone Encounter (Signed)
Fyi, pt has appoint with you on 3.18.16 at 1:30pm

## 2014-07-05 ENCOUNTER — Encounter: Payer: Self-pay | Admitting: Nurse Practitioner

## 2014-07-05 ENCOUNTER — Ambulatory Visit (INDEPENDENT_AMBULATORY_CARE_PROVIDER_SITE_OTHER): Payer: Medicare Other | Admitting: Nurse Practitioner

## 2014-07-05 VITALS — BP 140/72 | HR 72 | Temp 98.1°F | Resp 14 | Ht 63.0 in | Wt 145.8 lb

## 2014-07-05 DIAGNOSIS — J302 Other seasonal allergic rhinitis: Secondary | ICD-10-CM

## 2014-07-05 DIAGNOSIS — Z7901 Long term (current) use of anticoagulants: Secondary | ICD-10-CM | POA: Diagnosis not present

## 2014-07-05 LAB — CBC WITH DIFFERENTIAL/PLATELET
Basophils Absolute: 0 10*3/uL (ref 0.0–0.1)
Basophils Relative: 0.5 % (ref 0.0–3.0)
Eosinophils Absolute: 0.1 10*3/uL (ref 0.0–0.7)
Eosinophils Relative: 1.5 % (ref 0.0–5.0)
HCT: 34 % — ABNORMAL LOW (ref 36.0–46.0)
Hemoglobin: 11.4 g/dL — ABNORMAL LOW (ref 12.0–15.0)
Lymphocytes Relative: 26 % (ref 12.0–46.0)
Lymphs Abs: 1.8 10*3/uL (ref 0.7–4.0)
MCHC: 33.6 g/dL (ref 30.0–36.0)
MCV: 90.8 fl (ref 78.0–100.0)
Monocytes Absolute: 0.7 10*3/uL (ref 0.1–1.0)
Monocytes Relative: 10.1 % (ref 3.0–12.0)
Neutro Abs: 4.2 10*3/uL (ref 1.4–7.7)
Neutrophils Relative %: 61.9 % (ref 43.0–77.0)
Platelets: 162 10*3/uL (ref 150.0–400.0)
RBC: 3.75 Mil/uL — ABNORMAL LOW (ref 3.87–5.11)
RDW: 13 % (ref 11.5–15.5)
WBC: 6.8 10*3/uL (ref 4.0–10.5)

## 2014-07-05 LAB — COMPREHENSIVE METABOLIC PANEL
ALT: 6 U/L (ref 0–35)
AST: 16 U/L (ref 0–37)
Albumin: 3.8 g/dL (ref 3.5–5.2)
Alkaline Phosphatase: 90 U/L (ref 39–117)
BUN: 14 mg/dL (ref 6–23)
CO2: 30 mEq/L (ref 19–32)
Calcium: 9 mg/dL (ref 8.4–10.5)
Chloride: 103 mEq/L (ref 96–112)
Creatinine, Ser: 0.99 mg/dL (ref 0.40–1.20)
GFR: 57.04 mL/min — ABNORMAL LOW (ref >60.00)
Glucose, Bld: 95 mg/dL (ref 70–99)
Potassium: 4.4 mEq/L (ref 3.5–5.1)
Sodium: 136 mEq/L (ref 135–145)
Total Bilirubin: 0.6 mg/dL (ref 0.2–1.2)
Total Protein: 6.8 g/dL (ref 6.0–8.3)

## 2014-07-05 NOTE — Progress Notes (Signed)
   Subjective:    [name], female    DOB: [birth year], 80 y.o.   MRN: [id number]  HPI  Alexandra Martinez is a 81 yo female with a CC of coughing and bleeding of gums.  1) Cough- productive, nasal drainage, headaches,   Saw on 05/28/14  Drainage in the morning, coughing up yellow, headaches   - Pain pill helps with headaches  - Claritin- occasionally   Simply saline several times a day  - Benadryl helpful when taken  2) Xarelto- Bleeding of the gums   Wakes up with blood in her mouth, intermittently, believes it is coming from a specific tooth, scheduled for a filling soon, tastes it but does not see it, blots her tooth with a Kleenex and sees it on the kleenex. Doesn't think it is that much.   Review of Systems  Constitutional: Negative for fever, chills, diaphoresis and fatigue.  HENT: Positive for rhinorrhea.   Eyes: Negative for visual disturbance.  Respiratory: Positive for cough. Negative for chest tightness, shortness of breath and wheezing.   Cardiovascular: Negative for chest pain, palpitations and leg swelling.  Gastrointestinal: Negative for nausea, vomiting and diarrhea.  Skin: Negative for rash.  Neurological: Positive for headaches. Negative for dizziness, weakness and numbness.  Psychiatric/Behavioral: The patient is not nervous/anxious.        Objective:   Physical Exam  Constitutional: She is oriented to person, place, and time. She appears well-developed and well-nourished. No distress.  BP 140/72 mmHg  Pulse 72  Temp(Src) 98.1 F (36.7 C) (Oral)  Resp 14  Ht 5\' 3"  (1.6 m)  Wt 145 lb 12.8 oz (66.134 kg)  BMI 25.83 kg/m2  SpO2 98%   HENT:  Head: Normocephalic and atraumatic.  Right Ear: External ear normal.  Left Ear: External ear normal.  Mouth/Throat: No oropharyngeal exudate.  No blood seen in mouth or around tooth that is bothering her  Eyes: Right eye exhibits no discharge. Left eye exhibits no discharge. No scleral icterus.  Neck:  Normal range of motion. Neck supple.  Cardiovascular: Normal rate, regular rhythm and normal heart sounds.  Exam reveals no gallop and no friction rub.   No murmur heard. Pulmonary/Chest: Effort normal and breath sounds normal. No respiratory distress. She has no wheezes. She has no rales. She exhibits no tenderness.  Lymphadenopathy:    She has no cervical adenopathy.  Neurological: She is alert and oriented to person, place, and time. No cranial nerve deficit. She exhibits normal muscle tone. Coordination normal.  Skin: Skin is warm and dry. No rash noted. She is not diaphoretic.  Psychiatric: She has a normal mood and affect. Her behavior is normal. Judgment and thought content normal.          Assessment & Plan:

## 2014-07-05 NOTE — Progress Notes (Signed)
Pre visit review using our clinic review tool, if applicable. No additional management support is needed unless otherwise documented below in the visit note. 

## 2014-07-05 NOTE — Patient Instructions (Signed)
Benadryl at night time. No claritin  We will check your blood levels today.

## 2014-07-08 ENCOUNTER — Encounter: Payer: Self-pay | Admitting: *Deleted

## 2014-07-08 DIAGNOSIS — R262 Difficulty in walking, not elsewhere classified: Secondary | ICD-10-CM | POA: Diagnosis not present

## 2014-07-08 DIAGNOSIS — M545 Low back pain: Secondary | ICD-10-CM | POA: Diagnosis not present

## 2014-07-08 DIAGNOSIS — M4316 Spondylolisthesis, lumbar region: Secondary | ICD-10-CM | POA: Diagnosis not present

## 2014-07-10 DIAGNOSIS — M4316 Spondylolisthesis, lumbar region: Secondary | ICD-10-CM | POA: Diagnosis not present

## 2014-07-10 DIAGNOSIS — M545 Low back pain: Secondary | ICD-10-CM | POA: Diagnosis not present

## 2014-07-10 DIAGNOSIS — R262 Difficulty in walking, not elsewhere classified: Secondary | ICD-10-CM | POA: Diagnosis not present

## 2014-07-12 NOTE — Assessment & Plan Note (Signed)
Asked pt to take Benadryl at night especially since it is allergy season. FU prn worsening/failure to improve.

## 2014-07-12 NOTE — Assessment & Plan Note (Signed)
Will check CMET and CBC w/ diff to see if blood loss is significant from gums.

## 2014-07-15 DIAGNOSIS — M4316 Spondylolisthesis, lumbar region: Secondary | ICD-10-CM | POA: Diagnosis not present

## 2014-07-15 DIAGNOSIS — R262 Difficulty in walking, not elsewhere classified: Secondary | ICD-10-CM | POA: Diagnosis not present

## 2014-07-15 DIAGNOSIS — M545 Low back pain: Secondary | ICD-10-CM | POA: Diagnosis not present

## 2014-07-17 DIAGNOSIS — M545 Low back pain: Secondary | ICD-10-CM | POA: Diagnosis not present

## 2014-07-17 DIAGNOSIS — R262 Difficulty in walking, not elsewhere classified: Secondary | ICD-10-CM | POA: Diagnosis not present

## 2014-07-17 DIAGNOSIS — M4316 Spondylolisthesis, lumbar region: Secondary | ICD-10-CM | POA: Diagnosis not present

## 2014-07-19 ENCOUNTER — Encounter
Admit: 2014-07-19 | Disposition: A | Discharge status: Home / Self Care | Payer: Self-pay | Attending: Neurosurgery | Admitting: Neurosurgery

## 2014-07-19 DIAGNOSIS — R262 Difficulty in walking, not elsewhere classified: Secondary | ICD-10-CM | POA: Diagnosis not present

## 2014-07-19 DIAGNOSIS — M4316 Spondylolisthesis, lumbar region: Secondary | ICD-10-CM | POA: Diagnosis not present

## 2014-07-22 DIAGNOSIS — R262 Difficulty in walking, not elsewhere classified: Secondary | ICD-10-CM | POA: Diagnosis not present

## 2014-07-22 DIAGNOSIS — M4316 Spondylolisthesis, lumbar region: Secondary | ICD-10-CM | POA: Diagnosis not present

## 2014-07-24 DIAGNOSIS — R262 Difficulty in walking, not elsewhere classified: Secondary | ICD-10-CM | POA: Diagnosis not present

## 2014-07-24 DIAGNOSIS — M4316 Spondylolisthesis, lumbar region: Secondary | ICD-10-CM | POA: Diagnosis not present

## 2014-07-29 DIAGNOSIS — M4316 Spondylolisthesis, lumbar region: Secondary | ICD-10-CM | POA: Diagnosis not present

## 2014-07-29 DIAGNOSIS — R262 Difficulty in walking, not elsewhere classified: Secondary | ICD-10-CM | POA: Diagnosis not present

## 2014-07-31 DIAGNOSIS — M4316 Spondylolisthesis, lumbar region: Secondary | ICD-10-CM | POA: Diagnosis not present

## 2014-07-31 DIAGNOSIS — R262 Difficulty in walking, not elsewhere classified: Secondary | ICD-10-CM | POA: Diagnosis not present

## 2014-08-09 NOTE — Op Note (Signed)
PATIENT NAME:  Alexandra Martinez, Alexandra Martinez MR#:  [id number] DATE OF BIRTH:  Mar 02, 1930  DATE OF PROCEDURE:  04/17/2012  PREOPERATIVE DIAGNOSIS: Cataract, right eye.   POSTOPERATIVE DIAGNOSIS: Cataract, right eye.   PROCEDURE PERFORMED: Extracapsular cataract extraction using phacoemulsification with placement Alcon [id number], 21.5 diopter posterior chamber lens, serial number [id number].   SURGEON: Loura Back. Antoni Stefan, M.D.   ANESTHESIA: 4% lidocaine, 0.75% Marcaine, a 50-50 mixture with 10 units/mL of Hylenex added, given as a peribulbar.   ANESTHESIOLOGIST:  Gjibertus F. [place] Service, MD.   COMPLICATIONS: None.   ESTIMATED BLOOD LOSS: Less than 1 mL.   DESCRIPTION OF PROCEDURE:  The patient was brought to the operating room and given a peribulbar block.  The patient was then prepped and draped in the usual fashion.  The vertical rectus muscles were imbricated using 5-0 silk sutures.  These sutures were then clamped to the sterile drapes as bridle sutures.  A limbal peritomy was performed extending two clock hours and hemostasis was obtained with cautery.  A partial thickness scleral groove was made at the surgical limbus and dissected anteriorly in a lamellar dissection using an Alcon crescent knife.  The anterior chamber was entered superonasally with a Superblade and through the lamellar dissection with a 2.6 mm keratome.  DisCoVisc was used to replace the aqueous and a continuous tear capsulorrhexis was carried out.  Hydrodissection and hydrodelineation were carried out with balanced salt and a 27-gauge canula.  The nucleus was rotated to confirm the effectiveness of the hydrodissection.  Phacoemulsification was carried out using a divide-and-conquer technique.  Total ultrasound time was two minutes and 28 seconds with an average power of 21.1%.  CDE 53.01.  Irrigation/aspiration was used to remove the residual cortex.  DisCoVisc was used to inflate the capsule and the internal incision was enlarged  to 3 mm with the crescent knife.  The intraocular lens was folded and inserted into the capsular bag using the Alcon AcrySert delivery system.  Irrigation/aspiration was used to remove the residual DisCoVisc.  Miostat was injected into the anterior chamber through the paracentesis track to inflate the anterior chamber and induce miosis.  The wound was checked for leaks and none were found. The conjunctiva was closed with cautery and the bridle sutures were removed.  Two drops of 0.3% Vigamox were placed on the eye.   An eye shield was placed on the eye.  At the end of the procedure, 10 mL of cephalexin was injected in the anterior chamber with a 27-gauge cannula beveled down under the anterior capsule.  The patient was discharged to the recovery room in good condition.    ____________________________ Loura Back Ata Pecha, MD sad:eg D: 04/17/2012 13:27:39 ET T: 04/17/2012 15:24:12 ET JOB#: [id number]  cc: Remo Lipps A. Khyleigh Furney, MD, <Dictator> Martie Lee MD ELECTRONICALLY SIGNED 04/24/2012 13:46

## 2014-08-28 DIAGNOSIS — I482 Chronic atrial fibrillation: Secondary | ICD-10-CM | POA: Diagnosis not present

## 2014-08-28 DIAGNOSIS — I420 Dilated cardiomyopathy: Secondary | ICD-10-CM | POA: Diagnosis not present

## 2014-08-28 DIAGNOSIS — K219 Gastro-esophageal reflux disease without esophagitis: Secondary | ICD-10-CM | POA: Diagnosis not present

## 2014-08-28 DIAGNOSIS — I38 Endocarditis, valve unspecified: Secondary | ICD-10-CM | POA: Diagnosis not present

## 2014-09-05 ENCOUNTER — Other Ambulatory Visit: Payer: Self-pay | Admitting: Internal Medicine

## 2014-09-21 ENCOUNTER — Other Ambulatory Visit: Payer: Self-pay | Admitting: Internal Medicine

## 2014-09-24 DIAGNOSIS — Z961 Presence of intraocular lens: Secondary | ICD-10-CM | POA: Diagnosis not present

## 2014-10-22 ENCOUNTER — Telehealth: Payer: Self-pay | Admitting: Internal Medicine

## 2014-10-22 ENCOUNTER — Ambulatory Visit (INDEPENDENT_AMBULATORY_CARE_PROVIDER_SITE_OTHER): Payer: Medicare Other | Admitting: Internal Medicine

## 2014-10-22 VITALS — BP 132/84 | HR 72 | Temp 97.9°F | Resp 14 | Ht 63.0 in | Wt 145.6 lb

## 2014-10-22 DIAGNOSIS — R0982 Postnasal drip: Secondary | ICD-10-CM | POA: Diagnosis not present

## 2014-10-22 DIAGNOSIS — Z1239 Encounter for other screening for malignant neoplasm of breast: Secondary | ICD-10-CM

## 2014-10-22 MED ORDER — MOMETASONE FUROATE 50 MCG/ACT NA SUSP: 2.0000 | Freq: Every day | NASAL | Status: DC

## 2014-10-22 MED ORDER — FEXOFENADINE HCL 180 MG PO TABS: 180.0000 mg | ORAL_TABLET | Freq: Every day | ORAL | Status: DC

## 2014-10-22 MED ORDER — ONDANSETRON HCL 4 MG PO TABS: ORAL_TABLET | ORAL | Status: DC

## 2014-10-22 MED ORDER — PREDNISONE 10 MG PO TABS: ORAL_TABLET | ORAL | Status: DC

## 2014-10-22 NOTE — Progress Notes (Signed)
Pre visit review using our clinic review tool, if applicable. No additional management support is needed unless otherwise documented below in the visit note. 

## 2014-10-22 NOTE — Telephone Encounter (Signed)
PLEASE NOTE: All timestamps contained within this report are represented as [place] Standard Time. CONFIDENTIALTY NOTICE: This fax transmission is intended only for the addressee. It contains information that is legally privileged, confidential or otherwise protected from use or disclosure. If you are not the intended recipient, you are strictly prohibited from reviewing, disclosing, copying using or disseminating any of this information or taking any action in reliance on or regarding this information. If you have received this fax in error, please notify us immediately by telephone so that we can arrange for its return to [place]. Phone: [phone], Toll-Free: [phone], Fax: [phone] Page: 1 of 1 Call Id: [id number] [place] Patient Name: [facility] DOB: [birth year] Initial Comment Caller states been coughing up yellow stuff, nauseated, needing to see dr Nurse Assessment Nurse: Marcelline Deist, RN, Kermit Balo Date/Time (Eastern Time): 10/22/2014 9:38:14 AM Confirm and document reason for call. If symptomatic, describe symptoms. ---Caller states she has been coughing up yellow stuff, nauseated, needing to see Dr. Sometimes sweaty, no chills. Not sure if fever. Having a lot of drainage, can't sleep. Headache now & then. Her throat is red. Sometimes wakes up with blood in her mouth. Has the patient traveled out of the country within the last 30 days? ---Not Applicable Does the patient require triage? ---Yes Related visit to physician within the last 2 weeks? ---No Does the PT have any chronic conditions? (i.e. diabetes, asthma, etc.) ---Yes List chronic conditions. ---back surgery, A fib, on Xarelto Guidelines Guideline Title Affirmed Question Affirmed Notes Cough - Acute Productive [1] Continuous (nonstop) coughing interferes with work or school AND [2] no improvement using cough treatment  per Care Advice Final Disposition User See Physician within [place], RN, [employer] would prefer to see a physician rather than a nurse/NP. Scheduled for first available on Thursday with her Dr., although if there is an appt. that becomes available sooner, please let patient know. Her niece [name] [phone]) can be called if unable to reach her at this #. Caller states she is also out of her Ondansetron 4 mg for nausea.

## 2014-10-22 NOTE — Telephone Encounter (Signed)
I can see Alexandra Martinez today or tomorrow at 4:30

## 2014-10-22 NOTE — Addendum Note (Signed)
Addended by: Crecencio Mc on: 10/22/2014 12:59 PM   Modules accepted: Orders

## 2014-10-22 NOTE — Telephone Encounter (Signed)
Called pt, pt states she will be here today at 4:30.

## 2014-10-22 NOTE — Patient Instructions (Addendum)
You have post nasal drip .  post nasal drip is causing your sore throat.  You should gargle with salt water often for the sore throat.   Please start taking Allegra 180 mg daily instead of the benadryl.  This is an antihistamine and will take care of allergies  I am also prescribing a prednisone  taper  To help with the inflammation and  Congestion.   Please also start using the sinus spray I have prescribed daily  Called nasonex   You can also flush your  sinuses daily with Milta Deiters Meds sinus rinse before bedtime (available OTC)   If you have not improved in 2 weeks,  Call for referral to Dr Tami Ribas

## 2014-10-22 NOTE — Telephone Encounter (Signed)
FYI

## 2014-10-22 NOTE — Progress Notes (Signed)
Subjective:  [name], female    DOB: 1929/12/04  Age: 81 y.o. MRN: [id number]  CC: The primary encounter diagnosis was Breast cancer screening. A diagnosis of Post-nasal drip was also pertinent to this visit.  HPI Alexandra Martinez presents for persistent sinus drainage,  Wakes up every monring with nausea and Sore throat from the drainage.  snot responding to otc benadryl. And saline spray .  Was treated in  March,  Hasn't felt well since then ,  Dry   Coughs daily,  Throat feels dry ,  Occasional episodes of recurrent sneezing,   Ear hurting on the right side. .chest feels tight sometimes in the mornings.        Outpatient Prescriptions Prior to Visit  Medication Sig Dispense Refill  . acetaminophen (TYLENOL) 500 MG tablet Take 500 mg by mouth every 6 (six) hours as needed for headache.    Marland Kitchen ammonium lactate (AMLACTIN) 12 % cream Apply 1 g topically 2 (two) times daily.    . Esomeprazole Magnesium (NEXIUM PO) Take 22.3 mg by mouth daily.    Marland Kitchen levothyroxine (SYNTHROID, LEVOTHROID) 50 MCG tablet TAKE 1 TABLET BY MOUTH EVERY DAY 30 tablet 5  . loratadine (CLARITIN) 10 MG tablet Take 10 mg by mouth daily.    . metoprolol (LOPRESSOR) 100 MG tablet TAKE 1 TABLET BY MOUTH TWICE DAILY 180 tablet 0  . ondansetron (ZOFRAN) 4 MG tablet TAKE 1 TABLET BY MOUTH EVERY 8 HOURS AS NEEDED FOR NAUSEA OR VOMITING 30 tablet 2  . oxyCODONE-acetaminophen (PERCOCET/ROXICET) 5-325 MG per tablet Take 1-2 tablets by mouth every 4 (four) hours as needed for moderate pain. 60 tablet 0  . Rivaroxaban (XARELTO) 15 MG TABS tablet Take 15 mg by mouth daily with supper.     . Simethicone (EQ GAS RELIEF) 125 MG CAPS Take 125 mg by mouth daily as needed (gas).    . traMADol (ULTRAM) 50 MG tablet Take 50 mg by mouth every 8 (eight) hours as needed (pain).    Marland Kitchen levothyroxine (SYNTHROID, LEVOTHROID) 50 MCG tablet Take 50 mcg by mouth daily before breakfast.    . metoprolol (LOPRESSOR) 100 MG tablet Take 100 mg  by mouth 2 (two) times daily.     No facility-administered medications prior to visit.    Review of Systems;  Patient denies headache, fevers, malaise, unintentional weight loss, skin rash, eye pain, sinus congestion and sinus pain, sore throat, dysphagia,  hemoptysis , cough, dyspnea, wheezing, chest pain, palpitations, orthopnea, edema, abdominal pain, nausea, melena, diarrhea, constipation, flank pain, dysuria, hematuria, urinary  Frequency, nocturia, numbness, tingling, seizures,  Focal weakness, Loss of consciousness,  Tremor, insomnia, depression, anxiety, and suicidal ideation.      Objective:  BP 132/84 mmHg  Pulse 72  Temp(Src) 97.9 F (36.6 C)  Resp 14  Ht 5\' 3"  (1.6 m)  Wt 145 lb 9.6 oz (66.044 kg)  BMI 25.80 kg/m2  SpO2 98%  BP Readings from Last 3 Encounters:  10/22/14 132/84  07/05/14 140/72  05/28/14 130/68    Wt Readings from Last 3 Encounters:  10/22/14 145 lb 9.6 oz (66.044 kg)  07/05/14 145 lb 12.8 oz (66.134 kg)  05/28/14 145 lb 6.4 oz (65.953 kg)    General appearance: alert, cooperative and appears stated age Ears: normal TM's and external ear canals both ears Throat: lips, mucosa, and tongue normal; teeth and gums normal. Slight tonsillalr erytehma without swelling  Neck: no adenopathy, no carotid bruit, supple, symmetrical, trachea  midline and thyroid not enlarged, symmetric, no tenderness/mass/nodules Back: symmetric, no curvature. ROM normal. No CVA tenderness. Lungs: clear to auscultation bilaterally Heart: regular rate and rhythm, S1, S2 normal, no murmur, click, rub or gallop Abdomen: soft, non-tender; bowel sounds normal; no masses,  no organomegaly Pulses: 2+ and symmetric Skin: Skin color, texture, turgor normal. No rashes or lesions Lymph nodes: Cervical, supraclavicular, and axillary nodes normal.  No results found for: HGBA1C  Lab Results  Component Value Date   CREATININE 0.99 07/05/2014   CREATININE 1.0 02/20/2014   CREATININE  1.04 01/03/2014    Lab Results  Component Value Date   WBC 6.8 07/05/2014   HGB 11.4* 07/05/2014   HCT 34.0* 07/05/2014   PLT 162.0 07/05/2014   GLUCOSE 95 07/05/2014   CHOL 217* 07/16/2013   TRIG 88.0 07/16/2013   HDL 41.70 07/16/2013   LDLCALC 158* 07/16/2013   ALT 6 07/05/2014   AST 16 07/05/2014   NA 136 07/05/2014   K 4.4 07/05/2014   CL 103 07/05/2014   CREATININE 0.99 07/05/2014   BUN 14 07/05/2014   CO2 30 07/05/2014   TSH 3.790 02/20/2014   INR 1.7* 07/16/2013    No results found.  Assessment & Plan:   Problem List Items Addressed This Visit      Unprioritized   Post-nasal drip    Exam is normal.  Supportive care outlined.        Other Visit Diagnoses    Breast cancer screening    -  Primary    Relevant Orders    MM DIGITAL SCREENING BILATERAL       I am having Alexandra Martinez start on mometasone, fexofenadine, and predniSONE. I am also having her maintain her Rivaroxaban, ammonium lactate, metoprolol, Simethicone, acetaminophen, Esomeprazole Magnesium (NEXIUM PO), levothyroxine, loratadine, traMADol, oxyCODONE-acetaminophen, metoprolol, levothyroxine, and ondansetron.  Meds ordered this encounter  Medications  . mometasone (NASONEX) 50 MCG/ACT nasal spray    Sig: Place 2 sprays into the nose daily. In each nostril    Dispense:  17 g    Refill:  12  . fexofenadine (HM FEXOFENADINE HCL) 180 MG tablet    Sig: Take 1 tablet (180 mg total) by mouth daily.    Dispense:  30 tablet    Refill:  1  . predniSONE (DELTASONE) 10 MG tablet    Sig: 6 tablets on Day 1 , then reduce by 1 tablet daily until gone    Dispense:  21 tablet    Refill:  0    There are no discontinued medications.  Follow-up: No Follow-up on file.   Crecencio Mc, MD

## 2014-10-24 ENCOUNTER — Ambulatory Visit: Payer: Self-pay | Admitting: Internal Medicine

## 2014-10-24 DIAGNOSIS — R0982 Postnasal drip: Secondary | ICD-10-CM | POA: Insufficient documentation

## 2014-10-24 NOTE — Assessment & Plan Note (Signed)
Exam is normal.  Supportive care outlined.

## 2014-12-18 ENCOUNTER — Other Ambulatory Visit: Payer: Self-pay | Admitting: Internal Medicine

## 2015-01-02 ENCOUNTER — Ambulatory Visit
Admission: RE | Admit: 2015-01-02 | Discharge: 2015-01-02 | Disposition: A | Discharge status: Home / Self Care | Payer: Medicare Other | Source: Ambulatory Visit | Attending: Family Medicine | Admitting: Family Medicine

## 2015-01-02 ENCOUNTER — Ambulatory Visit (INDEPENDENT_AMBULATORY_CARE_PROVIDER_SITE_OTHER): Payer: Medicare Other | Admitting: Family Medicine

## 2015-01-02 VITALS — BP 132/74 | HR 85 | Temp 97.9°F | Ht 63.0 in | Wt 147.2 lb

## 2015-01-02 DIAGNOSIS — R110 Nausea: Secondary | ICD-10-CM

## 2015-01-02 DIAGNOSIS — M1712 Unilateral primary osteoarthritis, left knee: Secondary | ICD-10-CM | POA: Insufficient documentation

## 2015-01-02 DIAGNOSIS — K5900 Constipation, unspecified: Secondary | ICD-10-CM

## 2015-01-02 DIAGNOSIS — R350 Frequency of micturition: Secondary | ICD-10-CM | POA: Diagnosis not present

## 2015-01-02 DIAGNOSIS — J302 Other seasonal allergic rhinitis: Secondary | ICD-10-CM

## 2015-01-02 DIAGNOSIS — M25562 Pain in left knee: Secondary | ICD-10-CM

## 2015-01-02 DIAGNOSIS — M25569 Pain in unspecified knee: Secondary | ICD-10-CM | POA: Diagnosis not present

## 2015-01-02 DIAGNOSIS — M25561 Pain in right knee: Secondary | ICD-10-CM | POA: Diagnosis not present

## 2015-01-02 LAB — COMPREHENSIVE METABOLIC PANEL
ALT: 6 U/L (ref 0–35)
AST: 15 U/L (ref 0–37)
Albumin: 3.8 g/dL (ref 3.5–5.2)
Alkaline Phosphatase: 91 U/L (ref 39–117)
BUN: 15 mg/dL (ref 6–23)
CO2: 30 mEq/L (ref 19–32)
Calcium: 8.9 mg/dL (ref 8.4–10.5)
Chloride: 104 mEq/L (ref 96–112)
Creatinine, Ser: 0.95 mg/dL (ref 0.40–1.20)
GFR: 59.75 mL/min — ABNORMAL LOW (ref >60.00)
Glucose, Bld: 88 mg/dL (ref 70–99)
Potassium: 4.5 mEq/L (ref 3.5–5.1)
Sodium: 140 mEq/L (ref 135–145)
Total Bilirubin: 0.5 mg/dL (ref 0.2–1.2)
Total Protein: 6.4 g/dL (ref 6.0–8.3)

## 2015-01-02 LAB — URINALYSIS, MICROSCOPIC ONLY

## 2015-01-02 LAB — CBC
HCT: 35.8 % — ABNORMAL LOW (ref 36.0–46.0)
Hemoglobin: 11.9 g/dL — ABNORMAL LOW (ref 12.0–15.0)
MCHC: 33.2 g/dL (ref 30.0–36.0)
MCV: 91.1 fl (ref 78.0–100.0)
Platelets: 149 10*3/uL — ABNORMAL LOW (ref 150.0–400.0)
RBC: 3.93 Mil/uL (ref 3.87–5.11)
RDW: 12.6 % (ref 11.5–15.5)
WBC: 7 10*3/uL (ref 4.0–10.5)

## 2015-01-02 LAB — POCT URINALYSIS DIPSTICK
Bilirubin, UA: NEGATIVE
Glucose, UA: NEGATIVE
Ketones, UA: NEGATIVE
Nitrite, UA: NEGATIVE
Protein, UA: NEGATIVE
Spec Grav, UA: 1.02
Urobilinogen, UA: 0.2
pH, UA: 5.5

## 2015-01-02 MED ORDER — CEPHALEXIN 500 MG PO CAPS: 500.0000 mg | ORAL_CAPSULE | Freq: Two times a day (BID) | ORAL | Status: DC

## 2015-01-02 MED ORDER — POLYETHYLENE GLYCOL 3350 17 GM/SCOOP PO POWD: 17.0000 g | Freq: Every day | ORAL | Status: DC | PRN

## 2015-01-02 NOTE — Progress Notes (Signed)
Pre visit review using our clinic review tool, if applicable. No additional management support is needed unless otherwise documented below in the visit note. 

## 2015-01-02 NOTE — Progress Notes (Signed)
[name], female   DOB: 1930/02/11, 81 y.o.   MRN: [id number]  Tommi Rumps, MD Phone: [phone]  Alexandra Martinez is a 81 y.o. female who presents today for same day appointment.  Allergic rhinitis: patient notes that she has post nasal drip frequently. She notes cough productive of green sputum. No blood in sputum. Sneezes all day. Notes some left ear pressure. Denies CP and SOB. No fevers. Uses nasonex sometimes.   Left knee pain: notes some discomfort with walking. Mild swelling. No erythema or warmth. Has had this for a long time. No popping, locking, or catching. No giving out. No injury. No prior XR.   Nausea: patient reports she wakes up each morning with nausea. Notes it lasts most of the morning. Denies abdominal pain, vomiting, and diarrhea. Notes she is constipated and goes every 2-3 days. Typically eats something to help her have a BM. Had a BM this morning that was darker than usual, though no blood. Has history of reflux and has had this recently with sour taste. No dysphagia. Not much of an appetite. No dysuria. Some frequency and urgency recently. Denies vaginal discharge. Has not been sexually active in >10 years. Has had cholecystectomy, appendectomy, and hysterectomy per patient. States she has her ovaries, though on review of records it appears she had a total hysterectomy. Has had EGD with gastritis and in history has hiatal hernia. Had normal colonoscopy in 2013. Takes her nexium in the morning.   PMH: nonsmoker.   ROS see HPI  Objective  Physical Exam Filed Vitals:   01/02/15 1315  BP: 132/74  Pulse: 85  Temp: 97.9 F (36.6 C)    BP Readings from Last 3 Encounters:  01/02/15 132/74  10/22/14 132/84  07/05/14 140/72   Wt Readings from Last 3 Encounters:  01/02/15 147 lb 3.2 oz (66.769 kg)  10/22/14 145 lb 9.6 oz (66.044 kg)  07/05/14 145 lb 12.8 oz (66.134 kg)    Physical Exam  Constitutional: She is well-developed, well-nourished,  and in no distress.  HENT:  Head: Normocephalic and atraumatic.  Right Ear: External ear normal.  Left Ear: External ear normal.  Mouth/Throat: Oropharynx is clear and moist.  Normal TMs bilaterally  Eyes: Conjunctivae are normal. Pupils are equal, round, and reactive to light.  Neck: Neck supple.  Cardiovascular: Exam reveals no gallop and no friction rub.   No murmur heard. Irregularly irregular, normal rate  Pulmonary/Chest: Effort normal and breath sounds normal. No respiratory distress. She has no wheezes. She has no rales.  Abdominal: Soft. Bowel sounds are normal.  Nondistended, patient with mild intermittent tenderness diffusely, on first occasion palpating abdomen patient had no discomfort, on second occasion she had minimal tenderness, on 3rd occasion she had no tenderness, no guarding, no rebound, no masses, no HSM  Genitourinary: Right adnexa normal and left adnexa normal. Guaiac negative stool. Vaginal discharge (mild clear discharge) found.  No discomfort on bimanual exam External hemorrhoids on rectal exam, stool in vault that is soft, no blood noted, normal rectal tone  Musculoskeletal: She exhibits no edema.  Left knee with mild joint line effusions, no joint line tenderness, no warmth of erythema of the knee, full ROM, no ligamentous laxity, negative mcmurray Right knee with no swelling, tenderness, ligamentous laxity, warmth, or erythema, full ROM, negative mcucmurray  Lymphadenopathy:    She has no cervical adenopathy.  Neurological:  5/5 strength in bilateral quads, hamstrings, plantar and dorsiflexion, sensation to light touch intact in bilateral  LE, normal gait, 2+ patellar reflexes  Skin: Skin is warm and dry. No rash noted. She is not diaphoretic.     Assessment/Plan: Please see individual problem list.  Seasonal rhinitis Patient with symptoms of allergic rhinitis. Normal exam today. Discussed taking nasonex daily and allegra daily and monitor for improvement.    Left knee pain Suspect OA is cause of patients discomfort. No erythema or warmth or fever to indicate septic joint. No injury to indicate traumatic injury as cause of pain. Will check XRs bilateral knees to evaluate for OA. Tylenol for discomfort. Continue to monitor at this time.   Nausea without vomiting Patient with nausea in the morning. Other symptoms include constipation and reflux. She is well appearing. No fevers. She denies abdominal pain, though noted mild discomfort on palpation intermittently on exam. Mostly with no discomfort on abdominal exam. No peritoneal signs. Pelvic exam unremarkable except for mild clear discharge. Unlikely related to this discharge. Does not have gall bladder, appendix, or uterus, so unlikely this is related to pathology in these areas. UA with signs of possible UTI and patient with intermittent UTI symptoms. Rectal exam with stool apparent making constipation a possible cause. Suspect this nausea is related to her reflux vs constipation. Will change nexium dosing to nightly given nausea is in the morning. Will start on miralax for her constipation. Will start on keflex for UTI and send urine culture and micro. Will give stool cards. Will send wet prep. Will check CMET and CBC as well. Given return precautions.     Orders Placed This Encounter  Procedures  . Urine Culture  . WET PREP BY MOLECULAR PROBE  . DG Knee AP/LAT W/Sunrise Left    Standing Status: Future     Number of Occurrences: 1     Standing Expiration Date: 03/03/2016    Order Specific Question:  Reason for Exam (SYMPTOM  OR DIAGNOSIS REQUIRED)    Answer:  left knee pain, OA - perform standing    Order Specific Question:  Preferred imaging location?    Answer:  [facility] Knee AP/LAT W/Sunrise Right    Standing Status: Future     Number of Occurrences: 1     Standing Expiration Date: 03/03/2016    Order Specific Question:  Reason for Exam (SYMPTOM  OR DIAGNOSIS REQUIRED)     Answer:  left knee pain, OA - perform standing    Order Specific Question:  Preferred imaging location?    Answer:  [facility] ([facility])  . CBC  . Urine Microscopic Only  . POCT Urinalysis Dipstick    Meds ordered this encounter  Medications  . polyethylene glycol powder (GLYCOLAX/MIRALAX) powder    Sig: Take 17 g by mouth daily as needed for mild constipation.    Dispense:  3350 g    Refill:  1  . cephALEXin (KEFLEX) 500 MG capsule    Sig: Take 1 capsule (500 mg total) by mouth 2 (two) times daily.    Dispense:  14 capsule    Refill:  0    Tommi Rumps

## 2015-01-02 NOTE — Patient Instructions (Addendum)
Nice to meet you. Please take your reflux medication daily.  Please take the miralax for constipation.  We will treat you for a UTI and send your urine for culture.  Please go get the x-ray of your knees.  You can take tylenol 500 mg every 6 hours for pain.  If you have fever, vomiting, diarrhea, abdominal pain, blood in your stool, chills, feel poorly, have increased swelling in your knee or redness please seek medical attention.

## 2015-01-03 LAB — WET PREP BY MOLECULAR PROBE
Candida species: NEGATIVE
Gardnerella vaginalis: NEGATIVE
Trichomonas vaginosis: NEGATIVE

## 2015-01-03 LAB — URINE CULTURE: Colony Count: 1000

## 2015-01-04 ENCOUNTER — Telehealth: Payer: Self-pay | Admitting: Family Medicine

## 2015-01-04 NOTE — Telephone Encounter (Signed)
Called patient to inform that her urine culture did not grow any bacteria and she should stop the antibiotic. She notes she feels better than when she was in the office. She does note she felt weak this morning. Notes she things this is from working some yesterday. It is an over all feeling of weakness. No focal weakness. No light headedness. No trouble breathing. No chest pain. No abdominal pain. Notes she had no other symptoms other than the weakness. Notes she feels ok at this time. She felt this was related to the miralax and has been taking this once a day, though did not have this yesterday after taking the miralax. Has not had a BM yet. Discussed that I did not think this was related to the miralax and most likely was related to her working yesterday. Advised that if she continued to feel weak she should be evaluated today and if she developed any other symptoms she should be evaluated. She voiced understanding.

## 2015-01-05 ENCOUNTER — Encounter: Payer: Self-pay | Admitting: Family Medicine

## 2015-01-05 DIAGNOSIS — R110 Nausea: Secondary | ICD-10-CM | POA: Insufficient documentation

## 2015-01-05 DIAGNOSIS — M25562 Pain in left knee: Secondary | ICD-10-CM | POA: Insufficient documentation

## 2015-01-05 NOTE — Assessment & Plan Note (Addendum)
Patient with symptoms of allergic rhinitis. Normal exam today. Discussed taking nasonex daily and allegra daily and monitor for improvement.

## 2015-01-05 NOTE — Assessment & Plan Note (Addendum)
Patient with nausea in the morning. Other symptoms include constipation and reflux. She is well appearing. No fevers. She denies abdominal pain, though noted mild discomfort on palpation intermittently on exam. Mostly with no discomfort on abdominal exam. No peritoneal signs. Pelvic exam unremarkable except for mild clear discharge. Unlikely related to this discharge. Does not have gall bladder, appendix, or uterus, so unlikely this is related to pathology in these areas. UA with signs of possible UTI and patient with intermittent UTI symptoms. Rectal exam with stool apparent making constipation a possible cause. Suspect this nausea is related to her reflux vs constipation. Will change nexium dosing to nightly given nausea is in the morning. Will start on miralax for her constipation. Will start on keflex for UTI and send urine culture and micro. Will give stool cards. Will send wet prep. Will check CMET and CBC as well. Given return precautions.

## 2015-01-05 NOTE — Assessment & Plan Note (Signed)
Suspect OA is cause of patients discomfort. No erythema or warmth or fever to indicate septic joint. No injury to indicate traumatic injury as cause of pain. Will check XRs bilateral knees to evaluate for OA. Tylenol for discomfort. Continue to monitor at this time.

## 2015-01-06 ENCOUNTER — Telehealth: Payer: Self-pay | Admitting: Family Medicine

## 2015-01-06 NOTE — Telephone Encounter (Signed)
Pt called to sch appt for a follow up with Dr Caryl Bis, but pt does have an appt

## 2015-01-07 ENCOUNTER — Ambulatory Visit: Payer: PRIVATE HEALTH INSURANCE | Admitting: Family Medicine

## 2015-01-07 ENCOUNTER — Ambulatory Visit (INDEPENDENT_AMBULATORY_CARE_PROVIDER_SITE_OTHER): Payer: Medicare Other | Admitting: Family Medicine

## 2015-01-07 ENCOUNTER — Encounter: Payer: Self-pay | Admitting: Family Medicine

## 2015-01-07 VITALS — BP 114/72 | HR 74 | Temp 98.4°F | Ht 63.0 in | Wt 145.5 lb

## 2015-01-07 DIAGNOSIS — M25562 Pain in left knee: Secondary | ICD-10-CM | POA: Diagnosis not present

## 2015-01-07 MED ORDER — METHYLPREDNISOLONE ACETATE 40 MG/ML IJ SUSP
40.0000 mg | Freq: Once | INTRAMUSCULAR | Status: AC
  Administered 2015-01-07: 40 mg via INTRA_ARTICULAR

## 2015-01-07 NOTE — Progress Notes (Signed)
Pre visit review using our clinic review tool, if applicable. No additional management support is needed unless otherwise documented below in the visit note. 

## 2015-01-07 NOTE — Progress Notes (Signed)
Subjective:  [name], female    DOB: [birth year]  Age: 81 y.o. MRN: [id number]  CC: Follow up xray results/Knee pain  HPI:  81 year old female with a past medical history of atrial fibrillation on Xarelto presents to clinic today for follow-up regarding her recent x-ray results and for further evaluation of her knee pain.  Patient was seen on 9/15. At that time she reported left knee pain. Pain was thought to be from underlying OA. X-rays were obtained. Radiology read these x-rays is normal however I noted medial joint narrowing of the left knee film.  She presents today for follow-up to discuss results and discuss treatment options. She reports she continues to have intermittent left knee pain of moderate severity. No reports of feeling as if it is going to give way but she does report some popping. No recent swelling or redness. Pain is exacerbated by physical activity. No relieving factors.  Social Hx   Social History   Social History  . Marital Status: Widowed    Spouse Name: N/A  . Number of Children: N/A  . Years of Education: N/A   Social History Main Topics  . Smoking status: Never Smoker   . Smokeless tobacco: Never Used  . Alcohol Use: No  . Drug Use: No  . Sexual Activity: No   Other Topics Concern  . None   Social History Narrative   Lives alone, widowed in 2005.   Husband was a smoker.   Review of Systems  Constitutional: Negative.   Musculoskeletal:       Left knee pain.  Skin: Negative.    Objective:  BP 114/72 mmHg  Pulse 74  Temp(Src) 98.4 F (36.9 C) (Oral)  Ht 5\' 3"  (1.6 m)  Wt 145 lb 8 oz (65.998 kg)  BMI 25.78 kg/m2  SpO2 96%  BP/Weight 01/07/2015 [id number]  Systolic BP [id number]  Diastolic BP 72 74 84  Wt. (Lbs) 145.5 147.2 145.6  BMI 25.78 26.08 25.8   Physical Exam  Constitutional:  Elderly female in no acute distress.  Musculoskeletal:  Knee: Normal to inspection with no erythema or effusion or  obvious bony abnormalities.  Palpation normal with no warmth, joint line tenderness.  ROM full in flexion and extension.  Ligaments with solid consistent endpoints including ACL, PCL, LCL, MCL. Patellar glide without crepitus. Popping noted.  Patellar and quadriceps tendons unremarkable.  Neurological: She is alert.  Skin: Skin is warm and dry. No rash noted.  Psychiatric: She has a normal mood and affect.  Vitals reviewed.  Lab Results  Component Value Date   WBC 7.0 01/02/2015   HGB 11.9* 01/02/2015   HCT 35.8* 01/02/2015   PLT 149.0* 01/02/2015   GLUCOSE 88 01/02/2015   CHOL 217* 07/16/2013   TRIG 88.0 07/16/2013   HDL 41.70 07/16/2013   LDLCALC 158* 07/16/2013   ALT 6 01/02/2015   AST 15 01/02/2015   NA 140 01/02/2015   K 4.5 01/02/2015   CL 104 01/02/2015   CREATININE 0.95 01/02/2015   BUN 15 01/02/2015   CO2 30 01/02/2015   TSH 3.790 02/20/2014   INR 1.7* 07/16/2013    Assessment & Plan:   Problem List Items Addressed This Visit    Left knee pain - Primary   Relevant Medications   methylPREDNISolone acetate (DEPO-MEDROL) injection 40 mg (Completed)     Procedure: Left knee injection Consent signed and scanned into record. Medication:  1 mL of Depo-Medrol (40 mg)  and 4 mL of 1% lidocaine without epinephrine Preparation: area cleansed with alcohol Injection:  Landmarks identified Above medication injected using a standard anterior medial approach. Patient tolerated well without bleeding or paresthesias   Meds ordered this encounter  Medications  . methylPREDNISolone acetate (DEPO-MEDROL) injection 40 mg    Sig:     Follow-up: PRN  Thersa Salt, DO

## 2015-01-07 NOTE — Patient Instructions (Signed)
Follow up with Dr. Caryl Bis as scheduled or if your pain worsens or does not improve.  Take care  Dr. Lacinda Axon

## 2015-01-07 NOTE — Assessment & Plan Note (Signed)
Secondary to mild OA. We discussed treatment options today: Tylenol, anti-inflammatory, injection, referral, physical therapy. Patient elected for joint injection today. Injection performed without difficulty.  Patient to follow-up with PCP.

## 2015-01-13 ENCOUNTER — Other Ambulatory Visit: Payer: Medicare Other

## 2015-01-15 ENCOUNTER — Other Ambulatory Visit (INDEPENDENT_AMBULATORY_CARE_PROVIDER_SITE_OTHER): Payer: Medicare Other

## 2015-01-15 DIAGNOSIS — K5900 Constipation, unspecified: Secondary | ICD-10-CM | POA: Diagnosis not present

## 2015-01-15 DIAGNOSIS — K5909 Other constipation: Secondary | ICD-10-CM

## 2015-01-15 LAB — FECAL OCCULT BLOOD, IMMUNOCHEMICAL: Fecal Occult Bld: NEGATIVE

## 2015-01-17 ENCOUNTER — Encounter: Payer: Self-pay | Admitting: Family Medicine

## 2015-02-03 ENCOUNTER — Encounter: Payer: Self-pay | Admitting: Internal Medicine

## 2015-02-03 ENCOUNTER — Ambulatory Visit (INDEPENDENT_AMBULATORY_CARE_PROVIDER_SITE_OTHER): Payer: Medicare Other | Admitting: Internal Medicine

## 2015-02-03 VITALS — BP 122/70 | HR 82 | Temp 97.9°F | Wt 149.0 lb

## 2015-02-03 DIAGNOSIS — E559 Vitamin D deficiency, unspecified: Secondary | ICD-10-CM | POA: Diagnosis not present

## 2015-02-03 DIAGNOSIS — K5909 Other constipation: Secondary | ICD-10-CM

## 2015-02-03 DIAGNOSIS — E034 Atrophy of thyroid (acquired): Secondary | ICD-10-CM | POA: Diagnosis not present

## 2015-02-03 DIAGNOSIS — M25562 Pain in left knee: Secondary | ICD-10-CM

## 2015-02-03 DIAGNOSIS — Z7901 Long term (current) use of anticoagulants: Secondary | ICD-10-CM | POA: Diagnosis not present

## 2015-02-03 DIAGNOSIS — E038 Other specified hypothyroidism: Secondary | ICD-10-CM

## 2015-02-03 DIAGNOSIS — R110 Nausea: Secondary | ICD-10-CM

## 2015-02-03 DIAGNOSIS — Z23 Encounter for immunization: Secondary | ICD-10-CM

## 2015-02-03 NOTE — Progress Notes (Signed)
Subjective:  [name], female    DOB: 1929/07/09  Age: 81 y.o. MRN: [id number] CC: The primary encounter diagnosis was Nausea without vomiting. Diagnoses of Hypothyroidism due to acquired atrophy of thyroid, Vitamin D deficiency, Long term current use of anticoagulant therapy, Need for influenza vaccination, Need for vaccination with 13-polyvalent pneumococcal conjugate vaccine, Need for prophylactic vaccination and inoculation against influenza, Other constipation, and Left knee pain were also pertinent to this visit.  HPI RENADA ROEPER presents for follow up on chronic  issues  Leftr knee pain:  Mild DJD by films, s/p injection  by Dr Lacinda Axon in September .  The injection has helped  And she is wearing a spandex knee brace.  Bsck pain;  Had lumbar surgery by [facility] decompression adn stabilization L2-L5 in Sept 2015 ,  Bled profusely  Due to anticogulation .  Pain is aggravated by mopping and ironing.   Takes tramadol prn ,  1-2  daily.       Chronic  dyspepsia . Wakes up every morning nauseated.takes zofran every morning averages 5 days per week   Constipation: chronic , Has a BM every 3 to 4 days .  NO straining, denies blood in stools. Not taking any laxatives.   Outpatient Prescriptions Prior to Visit  Medication Sig Dispense Refill  . acetaminophen (TYLENOL) 500 MG tablet Take 500 mg by mouth every 6 (six) hours as needed for headache.    Marland Kitchen ammonium lactate (AMLACTIN) 12 % cream Apply 1 g topically 2 (two) times daily.    . cephALEXin (KEFLEX) 500 MG capsule Take 1 capsule (500 mg total) by mouth 2 (two) times daily. 14 capsule 0  . Esomeprazole Magnesium (NEXIUM PO) Take 22.3 mg by mouth daily.    . fexofenadine (HM FEXOFENADINE HCL) 180 MG tablet Take 1 tablet (180 mg total) by mouth daily. 30 tablet 1  . levothyroxine (SYNTHROID, LEVOTHROID) 50 MCG tablet Take 50 mcg by mouth daily before breakfast.    . levothyroxine (SYNTHROID, LEVOTHROID) 50 MCG tablet TAKE 1  TABLET BY MOUTH EVERY DAY 30 tablet 5  . metoprolol (LOPRESSOR) 100 MG tablet Take 100 mg by mouth 2 (two) times daily.    . metoprolol (LOPRESSOR) 100 MG tablet TAKE 1 TABLET BY MOUTH TWICE DAILY 180 tablet 0  . mometasone (NASONEX) 50 MCG/ACT nasal spray Place 2 sprays into the nose daily. In each nostril 17 g 12  . ondansetron (ZOFRAN) 4 MG tablet TAKE 1 TABLET BY MOUTH EVERY 8 HOURS AS NEEDED FOR NAUSEA OR VOMITING 30 tablet 2  . polyethylene glycol powder (GLYCOLAX/MIRALAX) powder Take 17 g by mouth daily as needed for mild constipation. 3350 g 1  . predniSONE (DELTASONE) 10 MG tablet 6 tablets on Day 1 , then reduce by 1 tablet daily until gone 21 tablet 0  . Rivaroxaban (XARELTO) 15 MG TABS tablet Take 15 mg by mouth daily with supper.     . Simethicone (EQ GAS RELIEF) 125 MG CAPS Take 125 mg by mouth daily as needed (gas).    . traMADol (ULTRAM) 50 MG tablet Take 50 mg by mouth every 8 (eight) hours as needed (pain).    Marland Kitchen loratadine (CLARITIN) 10 MG tablet Take 10 mg by mouth daily.    Marland Kitchen oxyCODONE-acetaminophen (PERCOCET/ROXICET) 5-325 MG per tablet Take 1-2 tablets by mouth every 4 (four) hours as needed for moderate pain. (Patient not taking: Reported on 02/03/2015) 60 tablet 0   No facility-administered medications prior to  visit.    Review of Systems;  Patient denies headache, fevers, malaise, unintentional weight loss, skin rash, eye pain, sinus congestion and sinus pain, sore throat, dysphagia,  hemoptysis , cough, dyspnea, wheezing, chest pain, palpitations, orthopnea, edema, abdominal pain, nausea, melena, diarrhea, constipation, flank pain, dysuria, hematuria, urinary  Frequency, nocturia, numbness, tingling, seizures,  Focal weakness, Loss of consciousness,  Tremor, insomnia, depression, anxiety, and suicidal ideation.      Objective:  BP 122/70 mmHg  Pulse 82  Temp(Src) 97.9 F (36.6 C) (Oral)  Wt 149 lb (67.586 kg)  SpO2 96%  BP Readings from Last 3 Encounters:    02/03/15 122/70  01/07/15 114/72  01/02/15 132/74    Wt Readings from Last 3 Encounters:  02/03/15 149 lb (67.586 kg)  01/07/15 145 lb 8 oz (65.998 kg)  01/02/15 147 lb 3.2 oz (66.769 kg)    General appearance: alert, cooperative and appears stated age Ears: normal TM's and external ear canals both ears Throat: lips, mucosa, and tongue normal; teeth and gums normal Neck: no adenopathy, no carotid bruit, supple, symmetrical, trachea midline and thyroid not enlarged, symmetric, no tenderness/mass/nodules Back: symmetric, no curvature. ROM normal. No CVA tenderness. Lungs: clear to auscultation bilaterally Heart: regular rate and rhythm, S1, S2 normal, no murmur, click, rub or gallop Abdomen: soft, non-tender; bowel sounds normal; no masses,  no organomegaly Pulses: 2+ and symmetric Skin: Skin color, texture, turgor normal. No rashes or lesions Lymph nodes: Cervical, supraclavicular, and axillary nodes normal.  No results found for: HGBA1C  Lab Results  Component Value Date   CREATININE 1.03 02/03/2015   CREATININE 0.95 01/02/2015   CREATININE 0.99 07/05/2014    Lab Results  Component Value Date   WBC 5.8 02/03/2015   HGB 11.6* 02/03/2015   HCT 34.5* 02/03/2015   PLT 154.0 02/03/2015   GLUCOSE 85 02/03/2015   CHOL 217* 07/16/2013   TRIG 88.0 07/16/2013   HDL 41.70 07/16/2013   LDLCALC 158* 07/16/2013   ALT 7 02/03/2015   AST 16 02/03/2015   NA 140 02/03/2015   K 4.6 02/03/2015   CL 103 02/03/2015   CREATININE 1.03 02/03/2015   BUN 14 02/03/2015   CO2 32 02/03/2015   TSH 1.79 02/03/2015   INR 1.7* 07/16/2013    Dg Knee Ap/lat W/sunrise Left  01/02/2015  CLINICAL DATA:  Right knee pain episodically EXAM: LEFT KNEE 3 VIEWS; RIGHT KNEE 3 VIEWS COMPARISON:  None. FINDINGS: The bones are subjectively osteopenic. No fracture or dislocation. No suprapatellar effusion. Patella tracks appropriately. IMPRESSION: Negative. Electronically Signed   By: Conchita Paris M.D.    On: 01/02/2015 18:05   Dg Knee Ap/lat W/sunrise Right  01/02/2015  CLINICAL DATA:  Right knee pain episodically EXAM: LEFT KNEE 3 VIEWS; RIGHT KNEE 3 VIEWS COMPARISON:  None. FINDINGS: The bones are subjectively osteopenic. No fracture or dislocation. No suprapatellar effusion. Patella tracks appropriately. IMPRESSION: Negative. Electronically Signed   By: Conchita Paris M.D.   On: 01/02/2015 18:05    Assessment & Plan:   Problem List Items Addressed This Visit    Left knee pain    secondaryto DJD and OA.  Improved with steroid injection.       Nausea without vomiting - Primary    Recurrent every morning, likely due to [facility] vs constipation. Recommended daily use of miralax and continue nexium       Relevant Orders   Comprehensive metabolic panel (Completed)   Constipation    Recommended an increase in fiber  intake to 25 to 35 grams daily.  Made several dietary suggestions of high fiber content including [place] low carb whole wheat tortillas which have 26 g fiber/serving, Toufayan flatbread which has around 10 g fiber,   Daily serving of any type of nuts,  and Atkins protein bars which have 8 to 10 g fiber.   Dispelled the popularly held notion that [employer] oatmeal, whole grain bread and most commericially made cereals have adequate fiber.  Finally I recommended daily use of Miralax., metamucil, citrucel, benefiber  or fibercon.       Long term current use of anticoagulant therapy   Relevant Orders   CBC with Differential/Platelet (Completed)   Hypothyroid   Relevant Orders   TSH (Completed)    Other Visit Diagnoses    Vitamin D deficiency        Relevant Orders    Vit D  25 hydroxy (rtn osteoporosis monitoring) (Completed)    Need for influenza vaccination        Relevant Orders    Flu vaccine HIGH DOSE PF (Fluzone High dose) (Completed)    Need for vaccination with 13-polyvalent pneumococcal conjugate vaccine        Relevant Orders    Pneumococcal conjugate vaccine  13-valent IM (Completed)    Need for prophylactic vaccination and inoculation against influenza         A total of 25 minutes of face to face time was spent with patient more than half of which was spent in counselling about the above mentioned conditions  and coordination of care   I have discontinued Ms. Berdan's loratadine. I am also having her maintain her Rivaroxaban, ammonium lactate, metoprolol, Simethicone, acetaminophen, Esomeprazole Magnesium (NEXIUM PO), levothyroxine, traMADol, oxyCODONE-acetaminophen, levothyroxine, ondansetron, mometasone, fexofenadine, predniSONE, metoprolol, polyethylene glycol powder, and cephALEXin.  No orders of the defined types were placed in this encounter.    Medications Discontinued During This Encounter  Medication Reason  . loratadine (CLARITIN) 10 MG tablet     Follow-up: Return in about 6 months (around 08/04/2015).   Crecencio Mc, MD

## 2015-02-03 NOTE — Progress Notes (Signed)
Pre visit review using our clinic review tool, if applicable. No additional management support is needed unless otherwise documented below in the visit note. 

## 2015-02-03 NOTE — Patient Instructions (Addendum)
Your nausea may be coming from your constipation    You can use the Miralax every day of your life to prevent and manage constipation  I recommend that you add a stool softener ,  Colace (the generic is docusate)  100 mg capsule  one daily at bedtime  If you are not moving your bowels daily

## 2015-02-04 DIAGNOSIS — K5900 Constipation, unspecified: Secondary | ICD-10-CM | POA: Insufficient documentation

## 2015-02-04 LAB — COMPREHENSIVE METABOLIC PANEL
ALT: 7 U/L (ref 0–35)
AST: 16 U/L (ref 0–37)
Albumin: 3.8 g/dL (ref 3.5–5.2)
Alkaline Phosphatase: 87 U/L (ref 39–117)
BUN: 14 mg/dL (ref 6–23)
CO2: 32 mEq/L (ref 19–32)
Calcium: 9.3 mg/dL (ref 8.4–10.5)
Chloride: 103 mEq/L (ref 96–112)
Creatinine, Ser: 1.03 mg/dL (ref 0.40–1.20)
GFR: 54.41 mL/min — ABNORMAL LOW (ref >60.00)
Glucose, Bld: 85 mg/dL (ref 70–99)
Potassium: 4.6 mEq/L (ref 3.5–5.1)
Sodium: 140 mEq/L (ref 135–145)
Total Bilirubin: 0.5 mg/dL (ref 0.2–1.2)
Total Protein: 6.4 g/dL (ref 6.0–8.3)

## 2015-02-04 LAB — CBC WITH DIFFERENTIAL/PLATELET
Basophils Absolute: 0.2 10*3/uL — ABNORMAL HIGH (ref 0.0–0.1)
Basophils Relative: 3.1 % — ABNORMAL HIGH (ref 0.0–3.0)
Eosinophils Absolute: 0.1 10*3/uL (ref 0.0–0.7)
Eosinophils Relative: 2.1 % (ref 0.0–5.0)
HCT: 34.5 % — ABNORMAL LOW (ref 36.0–46.0)
Hemoglobin: 11.6 g/dL — ABNORMAL LOW (ref 12.0–15.0)
Lymphocytes Relative: 22.5 % (ref 12.0–46.0)
Lymphs Abs: 1.3 10*3/uL (ref 0.7–4.0)
MCHC: 33.5 g/dL (ref 30.0–36.0)
MCV: 90.7 fl (ref 78.0–100.0)
Monocytes Absolute: 0.5 10*3/uL (ref 0.1–1.0)
Monocytes Relative: 8.7 % (ref 3.0–12.0)
Neutro Abs: 3.7 10*3/uL (ref 1.4–7.7)
Neutrophils Relative %: 63.6 % (ref 43.0–77.0)
Platelets: 154 10*3/uL (ref 150.0–400.0)
RBC: 3.81 Mil/uL — ABNORMAL LOW (ref 3.87–5.11)
RDW: 13.2 % (ref 11.5–15.5)
WBC: 5.8 10*3/uL (ref 4.0–10.5)

## 2015-02-04 LAB — VITAMIN D 25 HYDROXY (VIT D DEFICIENCY, FRACTURES): VITD: 21.24 ng/mL — ABNORMAL LOW (ref 30.00–100.00)

## 2015-02-04 LAB — TSH: TSH: 1.79 u[IU]/mL (ref 0.35–4.50)

## 2015-02-04 NOTE — Assessment & Plan Note (Signed)
secondaryto DJD and OA.  Improved with steroid injection.

## 2015-02-04 NOTE — Assessment & Plan Note (Signed)
Recommended an increase in fiber intake to 25 to 35 grams daily.  Made several dietary suggestions of high fiber content including [employer] low carb whole wheat tortillas which have 26 g fiber/serving, Toufayan flatbread which has around 10 g fiber,   Daily serving of any type of nuts,  and Atkins protein bars which have 8 to 10 g fiber.   Dispelled the popularly held notion that Quaker oats oatmeal, whole grain bread and most commericially made cereals have adequate fiber.  Finally I recommended daily use of Miralax., metamucil, citrucel, benefiber  or fibercon.  

## 2015-02-04 NOTE — Assessment & Plan Note (Addendum)
Recurrent every morning, likely due to [facility] vs constipation. Recommended daily use of miralax and continue nexium

## 2015-02-06 MED ORDER — ERGOCALCIFEROL 1.25 MG (50000 UT) PO CAPS: 50000.0000 [IU] | ORAL_CAPSULE | ORAL | Status: DC

## 2015-02-06 NOTE — Addendum Note (Signed)
Addended by: Crecencio Mc on: 02/06/2015 06:17 PM   Modules accepted: Orders

## 2015-02-10 ENCOUNTER — Other Ambulatory Visit: Payer: Self-pay | Admitting: Internal Medicine

## 2015-02-10 NOTE — Telephone Encounter (Signed)
Ok to refill,  printed rx  

## 2015-02-10 NOTE — Telephone Encounter (Signed)
Last OV 11.17.16, next OV 4.17.17.  Please advise refill

## 2015-02-11 NOTE — Telephone Encounter (Signed)
Refill faxed

## 2015-03-04 DIAGNOSIS — R0602 Shortness of breath: Secondary | ICD-10-CM | POA: Diagnosis not present

## 2015-03-04 DIAGNOSIS — I420 Dilated cardiomyopathy: Secondary | ICD-10-CM | POA: Diagnosis not present

## 2015-03-04 DIAGNOSIS — I4891 Unspecified atrial fibrillation: Secondary | ICD-10-CM | POA: Diagnosis not present

## 2015-03-04 DIAGNOSIS — I38 Endocarditis, valve unspecified: Secondary | ICD-10-CM | POA: Diagnosis not present

## 2015-03-04 DIAGNOSIS — I482 Chronic atrial fibrillation: Secondary | ICD-10-CM | POA: Diagnosis not present

## 2015-03-04 DIAGNOSIS — K219 Gastro-esophageal reflux disease without esophagitis: Secondary | ICD-10-CM | POA: Diagnosis not present

## 2015-03-17 ENCOUNTER — Other Ambulatory Visit: Payer: Self-pay | Admitting: Internal Medicine

## 2015-03-17 DIAGNOSIS — R05 Cough: Secondary | ICD-10-CM | POA: Diagnosis not present

## 2015-03-17 DIAGNOSIS — J019 Acute sinusitis, unspecified: Secondary | ICD-10-CM | POA: Diagnosis not present

## 2015-03-17 DIAGNOSIS — B9689 Other specified bacterial agents as the cause of diseases classified elsewhere: Secondary | ICD-10-CM | POA: Diagnosis not present

## 2015-03-25 ENCOUNTER — Other Ambulatory Visit: Payer: Self-pay | Admitting: Internal Medicine

## 2015-04-02 DIAGNOSIS — I420 Dilated cardiomyopathy: Secondary | ICD-10-CM | POA: Diagnosis not present

## 2015-04-02 DIAGNOSIS — R0602 Shortness of breath: Secondary | ICD-10-CM | POA: Diagnosis not present

## 2015-04-02 DIAGNOSIS — I482 Chronic atrial fibrillation: Secondary | ICD-10-CM | POA: Diagnosis not present

## 2015-04-09 DIAGNOSIS — R0602 Shortness of breath: Secondary | ICD-10-CM | POA: Diagnosis not present

## 2015-04-09 DIAGNOSIS — I4891 Unspecified atrial fibrillation: Secondary | ICD-10-CM | POA: Diagnosis not present

## 2015-05-01 ENCOUNTER — Other Ambulatory Visit: Payer: Self-pay | Admitting: Internal Medicine

## 2015-05-05 DIAGNOSIS — I420 Dilated cardiomyopathy: Secondary | ICD-10-CM | POA: Diagnosis not present

## 2015-05-05 DIAGNOSIS — I481 Persistent atrial fibrillation: Secondary | ICD-10-CM | POA: Diagnosis not present

## 2015-05-05 DIAGNOSIS — I38 Endocarditis, valve unspecified: Secondary | ICD-10-CM | POA: Diagnosis not present

## 2015-05-05 DIAGNOSIS — N183 Chronic kidney disease, stage 3 (moderate): Secondary | ICD-10-CM | POA: Diagnosis not present

## 2015-05-09 DIAGNOSIS — Z79899 Other long term (current) drug therapy: Secondary | ICD-10-CM | POA: Diagnosis not present

## 2015-06-12 ENCOUNTER — Other Ambulatory Visit: Payer: Self-pay | Admitting: Internal Medicine

## 2015-07-01 ENCOUNTER — Ambulatory Visit (INDEPENDENT_AMBULATORY_CARE_PROVIDER_SITE_OTHER): Payer: Medicare Other

## 2015-07-01 VITALS — BP 132/72 | HR 74 | Temp 97.4°F | Resp 14 | Ht 63.0 in | Wt 154.4 lb

## 2015-07-01 DIAGNOSIS — Z0000 Encounter for general adult medical examination without abnormal findings: Secondary | ICD-10-CM

## 2015-07-01 NOTE — Progress Notes (Signed)
Subjective:   Alexandra Martinez is a 82 y.o. female who presents for an Initial Medicare Annual Wellness Visit.  Review of Systems    No ROS.  Medicare Wellness Visit.  Cardiac Risk Factors include: advanced age (>2men, >19 women)     Objective:    Today's Vitals   07/01/15 0930 07/01/15 0945  BP: 132/72   Pulse: 74   Temp: 97.4 F (36.3 C)   TempSrc: Oral   Resp: 14   Height: 5\' 3"  (1.6 m)   Weight: 154 lb 6.4 oz (70.035 kg)   SpO2: 99%   PainSc:  1     Current Medications (verified) Outpatient Encounter Prescriptions as of 07/01/2015  Medication Sig  . acetaminophen (TYLENOL) 500 MG tablet Take 500 mg by mouth every 6 (six) hours as needed for headache.  . ergocalciferol (DRISDOL) 50000 UNITS capsule Take 1 capsule (50,000 Units total) by mouth once a week.  . levothyroxine (SYNTHROID, LEVOTHROID) 50 MCG tablet TAKE 1 TABLET BY MOUTH EVERY DAY  . metoprolol (LOPRESSOR) 100 MG tablet Take 100 mg by mouth 2 (two) times daily.  . Rivaroxaban (XARELTO) 15 MG TABS tablet Take 15 mg by mouth daily with supper.   . traMADol (ULTRAM) 50 MG tablet TAKE 1 TABLET BY MOUTH EVERY 8 HOURS AS NEEDED.  Marland Kitchen ammonium lactate (AMLACTIN) 12 % cream Apply 1 g topically 2 (two) times daily.  . Esomeprazole Magnesium (NEXIUM PO) Take 22.3 mg by mouth daily.  . fexofenadine (HM FEXOFENADINE HCL) 180 MG tablet Take 1 tablet (180 mg total) by mouth daily.  . mometasone (NASONEX) 50 MCG/ACT nasal spray Place 2 sprays into the nose daily. In each nostril (Patient not taking: Reported on 07/01/2015)  . ondansetron (ZOFRAN) 4 MG tablet TAKE 1 TABLET BY MOUTH EVERY 8 HOURS AS NEEDED FOR NAUSEA OR VOMITING (Patient not taking: Reported on 07/01/2015)  . polyethylene glycol powder (GLYCOLAX/MIRALAX) powder Take 17 g by mouth daily as needed for mild constipation. (Patient not taking: Reported on 07/01/2015)  . Simethicone (EQ GAS RELIEF) 125 MG CAPS Take 125 mg by mouth daily as needed (gas). Reported on  07/01/2015  . [DISCONTINUED] cephALEXin (KEFLEX) 500 MG capsule Take 1 capsule (500 mg total) by mouth 2 (two) times daily.  . [DISCONTINUED] levothyroxine (SYNTHROID, LEVOTHROID) 50 MCG tablet Take 50 mcg by mouth daily before breakfast.  . [DISCONTINUED] metoprolol (LOPRESSOR) 100 MG tablet TAKE 1 TABLET BY MOUTH TWICE DAILY  . [DISCONTINUED] oxyCODONE-acetaminophen (PERCOCET/ROXICET) 5-325 MG per tablet Take 1-2 tablets by mouth every 4 (four) hours as needed for moderate pain. (Patient not taking: Reported on 02/03/2015)  . [DISCONTINUED] predniSONE (DELTASONE) 10 MG tablet 6 tablets on Day 1 , then reduce by 1 tablet daily until gone   No facility-administered encounter medications on file as of 07/01/2015.    Allergies (verified) Celecoxib; Rofecoxib; Venlafaxine; Codeine; Dexilant; and Eliquis   History: Past Medical History  Diagnosis Date  . Hypothyroidism   . Osteoporosis   . Recurrent UTI   . GERD (gastroesophageal reflux disease)   . DDD (degenerative disc disease)   . Atrial fib/flutter, transient     [facility] admission for RVR 03/2009  . Hiatal hernia with gastroesophageal reflux 2008    by EGD,  [name]  . High cholesterol   . H/O hiatal hernia   . Hemorrhoids   . Nocturia   . Polyuria   . H/O bladder infections   . History of uterine fibroid   . Anemia  r/t uterine fibroids  . H/O thyroid nodule     surgically removed  . Hypertension     [facility]  . Heart murmur     moderate MR/TR 08/2013 echo [name])  . Hydronephrosis     uretero- with congenital UPJ obstruction- Dr. Edrick Oh  . Hydronephrosis with ureteropelvic junction obstruction    Past Surgical History  Procedure Laterality Date  . Cataract extraction      left  . Cholecystectomy    . Cervical disc surgery  1980's  . Thyroid lobectomy      partial, right lobe  . Total abdominal hysterectomy w/ bilateral salpingoophorectomy     Family History  Problem Relation Age of Onset  .  Heart disease Mother   . Coronary artery disease Father     MI's   Social History   Occupational History  . Not on file.   Social History Main Topics  . Smoking status: Never Smoker   . Smokeless tobacco: Never Used  . Alcohol Use: No  . Drug Use: No  . Sexual Activity: No    Tobacco Counseling Counseling given: Not Answered   Activities of Daily Living In your present state of health, do you have any difficulty performing the following activities: 07/01/2015  Hearing? N  Vision? N  Difficulty concentrating or making decisions? N  Walking or climbing stairs? Y  Dressing or bathing? N  Doing errands, shopping? N  Preparing Food and eating ? N  Using the Toilet? N  In the past six months, have you accidently leaked urine? Y  Do you have problems with loss of bowel control? N  Managing your Medications? N  Managing your Finances? N  Housekeeping or managing your Housekeeping? N    Immunizations and Health Maintenance Immunization History  Administered Date(s) Administered  . Influenza Split 02/22/2011, 01/14/2012  . Influenza, High Dose Seasonal PF 02/03/2015  . Influenza,inj,Quad PF,36+ Mos 02/08/2013, 01/10/2014  . Pneumococcal Conjugate-13 02/03/2015  . Pneumococcal Polysaccharide-23 05/14/2004   There are no preventive care reminders to display for this patient.  Patient Care Team: Crecencio Mc, MD as PCP - General (Internal Medicine)  Indicate any recent Medical Services you may have received from other than [facility] providers in the past year (date may be approximate).     Assessment:   This is a routine wellness examination for Alexandra Martinez. The goal of the wellness visit is to assist the patient how to close the gaps in care and create a preventative care plan for the patient.   Taking VIT D as appropriate/Osteoporosis risk reviewed.  Medications reviewed; taking without issues or barriers.  Safety issues reviewed; smoke detectors in the home. No firearms  in the home. Wears seatbelts when driving or riding with others. No violence in the home.  No identified risk were noted; The patient was oriented x 3; appropriate in dress and manner and no objective failures at ADL's or IADL's.   TDAP vaccine postponed for follow up with insurance, per patient request.  Patient Concerns:  None at this time.  Follow up with PCP as needed.  Return in 1 month for scheduled follow up.  Hearing/Vision screen Hearing Screening Comments: Passes the whisper test Vision Screening Comments: Followed by [facility] Wears glasses Bilateral cataracts extracted Annual visits   Dietary issues and exercise activities discussed: Current Exercise Habits: The patient does not participate in regular exercise at present  Goals    . Healthy Lifestyle  Start chair exercises.  Resume physical therapy exercises  Stay hydrated!  Drink plenty of fluids. Low carb foods.  Lean meats, fruits and vegetables.      Depression Screen PHQ 2/9 Scores 07/01/2015 07/05/2014 09/10/2012  PHQ - 2 Score 0 0 0    Fall Risk Fall Risk  07/01/2015 07/05/2014 09/10/2012  Falls in the past year? No No No    Cognitive Function: MMSE - Mini Mental State Exam 07/01/2015  Orientation to time 5  Orientation to Place 5  Registration 3  Attention/ Calculation 5  Recall 3  Language- name 2 objects 2  Language- repeat 1  Language- follow 3 step command 3  Language- read & follow direction 1  Write a sentence 1  Copy design 1  Total score 30    Screening Tests Health Maintenance  Topic Date Due  . TETANUS/TDAP  06/30/2016 (Originally 04/10/1949)  . INFLUENZA VACCINE  11/18/2015  . DEXA SCAN  Completed  . ZOSTAVAX  Addressed  . PNA vac Low Risk Adult  Completed      Plan:   End of life planning; Advance aging; Advanced directives discussed. Copy of current HCPOA/Living Will requested.    During the course of the visit, Dasja was educated and counseled about the  following appropriate screening and preventive services:   Vaccines to include Pneumoccal, Influenza, Hepatitis B, Td, Zostavax, HCV  Electrocardiogram  Cardiovascular disease screening  Colorectal cancer screening  Bone density screening  Diabetes screening  Glaucoma screening  Mammography/PAP  Nutrition counseling  Smoking cessation counseling  Patient Instructions (the written plan) were given to the patient.    Varney Biles, LPN   [id number]

## 2015-07-01 NOTE — Progress Notes (Signed)
  I have reviewed the above information and agree with above.   Ransome Helwig, MD 

## 2015-07-01 NOTE — Patient Instructions (Addendum)
Alexandra Martinez , Thank you for taking time to come for your Medicare Wellness Visit. I appreciate your ongoing commitment to your health goals. Please review the following plan we discussed and let me know if I can assist you in the future.   Return in 1 month for follow up with Dr. Derrel Nip.   This is a list of the screening recommended for you and due dates:  Health Maintenance  Topic Date Due  . Tetanus Vaccine  04/10/1949  . Flu Shot  11/18/2015  . DEXA scan (bone density measurement)  Completed  . Shingles Vaccine  Addressed  . Pneumonia vaccines  Completed    Health Maintenance, Female Adopting a healthy lifestyle and getting preventive care can go a long way to promote health and wellness. Talk with your health care provider about what schedule of regular examinations is right for you. This is a good chance for you to check in with your provider about disease prevention and staying healthy. In between checkups, there are plenty of things you can do on your own. Experts have done a lot of research about which lifestyle changes and preventive measures are most likely to keep you healthy. Ask your health care provider for more information. WEIGHT AND DIET  Eat a healthy diet  Be sure to include plenty of vegetables, fruits, low-fat dairy products, and lean protein.  Do not eat a lot of foods high in solid fats, added sugars, or salt.  Get regular exercise. This is one of the most important things you can do for your health.  Most adults should exercise for at least 150 minutes each week. The exercise should increase your heart rate and make you sweat (moderate-intensity exercise).  Most adults should also do strengthening exercises at least twice a week. This is in addition to the moderate-intensity exercise.  Maintain a healthy weight  Body mass index (BMI) is a measurement that can be used to identify possible weight problems. It estimates body fat based on height and weight. Your  health care provider can help determine your BMI and help you achieve or maintain a healthy weight.  For females 8 years of age and older:   A BMI below 18.5 is considered underweight.  A BMI of 18.5 to 24.9 is normal.  A BMI of 25 to 29.9 is considered overweight.  A BMI of 30 and above is considered obese.  Watch levels of cholesterol and blood lipids  You should start having your blood tested for lipids and cholesterol at 80 years of age, then have this test every 5 years.  You may need to have your cholesterol levels checked more often if:  Your lipid or cholesterol levels are high.  You are older than 80 years of age.  You are at high risk for heart disease.  CANCER SCREENING   Lung Cancer  Lung cancer screening is recommended for adults 24-82 years old who are at high risk for lung cancer because of a history of smoking.  A yearly low-dose CT scan of the lungs is recommended for people who:  Currently smoke.  Have quit within the past 15 years.  Have at least a 30-pack-year history of smoking. A pack year is smoking an average of one pack of cigarettes a day for 1 year.  Yearly screening should continue until it has been 15 years since you quit.  Yearly screening should stop if you develop a health problem that would prevent you from having lung cancer treatment.  Breast Cancer  Practice breast self-awareness. This means understanding how your breasts normally appear and feel.  It also means doing regular breast self-exams. Let your health care provider know about any changes, no matter how small.  If you are in your 20s or 30s, you should have a clinical breast exam (CBE) by a health care provider every 1-3 years as part of a regular health exam.  If you are 47 or older, have a CBE every year. Also consider having a breast X-ray (mammogram) every year.  If you have a family history of breast cancer, talk to your health care provider about genetic  screening.  If you are at high risk for breast cancer, talk to your health care provider about having an MRI and a mammogram every year.  Breast cancer gene (BRCA) assessment is recommended for women who have family members with BRCA-related cancers. BRCA-related cancers include:  Breast.  Ovarian.  Tubal.  Peritoneal cancers.  Results of the assessment will determine the need for genetic counseling and BRCA1 and BRCA2 testing. Cervical Cancer Your health care provider may recommend that you be screened regularly for cancer of the pelvic organs (ovaries, uterus, and vagina). This screening involves a pelvic examination, including checking for microscopic changes to the surface of your cervix (Pap test). You may be encouraged to have this screening done every 3 years, beginning at age 50.  For women ages 49-65, health care providers may recommend pelvic exams and Pap testing every 3 years, or they may recommend the Pap and pelvic exam, combined with testing for human papilloma virus (HPV), every 5 years. Some types of HPV increase your risk of cervical cancer. Testing for HPV may also be done on women of any age with unclear Pap test results.  Other health care providers may not recommend any screening for nonpregnant women who are considered low risk for pelvic cancer and who do not have symptoms. Ask your health care provider if a screening pelvic exam is right for you.  If you have had past treatment for cervical cancer or a condition that could lead to cancer, you need Pap tests and screening for cancer for at least 20 years after your treatment. If Pap tests have been discontinued, your risk factors (such as having a new sexual partner) need to be reassessed to determine if screening should resume. Some women have medical problems that increase the chance of getting cervical cancer. In these cases, your health care provider may recommend more frequent screening and Pap tests. Colorectal  Cancer  This type of cancer can be detected and often prevented.  Routine colorectal cancer screening usually begins at 81 years of age and continues through 80 years of age.  Your health care provider may recommend screening at an earlier age if you have risk factors for colon cancer.  Your health care provider may also recommend using home test kits to check for hidden blood in the stool.  A small camera at the end of a tube can be used to examine your colon directly (sigmoidoscopy or colonoscopy). This is done to check for the earliest forms of colorectal cancer.  Routine screening usually begins at age 24.  Direct examination of the colon should be repeated every 5-10 years through 80 years of age. However, you may need to be screened more often if early forms of precancerous polyps or small growths are found. Skin Cancer  Check your skin from head to toe regularly.  Tell your health care  provider about any new moles or changes in moles, especially if there is a change in a mole's shape or color.  Also tell your health care provider if you have a mole that is larger than the size of a pencil eraser.  Always use sunscreen. Apply sunscreen liberally and repeatedly throughout the day.  Protect yourself by wearing long sleeves, pants, a wide-brimmed hat, and sunglasses whenever you are outside. HEART DISEASE, DIABETES, AND HIGH BLOOD PRESSURE   High blood pressure causes heart disease and increases the risk of stroke. High blood pressure is more likely to develop in:  People who have blood pressure in the high end of the normal range (130-139/85-89 mm Hg).  People who are overweight or obese.  People who are African American.  If you are 11-50 years of age, have your blood pressure checked every 3-5 years. If you are 72 years of age or older, have your blood pressure checked every year. You should have your blood pressure measured twice--once when you are at a hospital or clinic,  and once when you are not at a hospital or clinic. Record the average of the two measurements. To check your blood pressure when you are not at a hospital or clinic, you can use:  An automated blood pressure machine at a pharmacy.  A home blood pressure monitor.  If you are between 52 years and 14 years old, ask your health care provider if you should take aspirin to prevent strokes.  Have regular diabetes screenings. This involves taking a blood sample to check your fasting blood sugar level.  If you are at a normal weight and have a low risk for diabetes, have this test once every three years after 80 years of age.  If you are overweight and have a high risk for diabetes, consider being tested at a younger age or more often. PREVENTING INFECTION  Hepatitis B  If you have a higher risk for hepatitis B, you should be screened for this virus. You are considered at high risk for hepatitis B if:  You were born in a country where hepatitis B is common. Ask your health care provider which countries are considered high risk.  Your parents were born in a high-risk country, and you have not been immunized against hepatitis B (hepatitis B vaccine).  You have HIV or AIDS.  You use needles to inject street drugs.  You live with someone who has hepatitis B.  You have had sex with someone who has hepatitis B.  You get hemodialysis treatment.  You take certain medicines for conditions, including cancer, organ transplantation, and autoimmune conditions. Hepatitis C  Blood testing is recommended for:  Everyone born from [birth year] through 1963.  Anyone with known risk factors for hepatitis C. Sexually transmitted infections (STIs)  You should be screened for sexually transmitted infections (STIs) including gonorrhea and chlamydia if:  You are sexually active and are younger than 80 years of age.  You are older than 80 years of age and your health care provider tells you that you are at risk  for this type of infection.  Your sexual activity has changed since you were last screened and you are at an increased risk for chlamydia or gonorrhea. Ask your health care provider if you are at risk.  If you do not have HIV, but are at risk, it may be recommended that you take a prescription medicine daily to prevent HIV infection. This is called pre-exposure prophylaxis (PrEP). You are  considered at risk if:  You are sexually active and do not regularly use condoms or know the HIV status of your partner(s).  You take drugs by injection.  You are sexually active with a partner who has HIV. Talk with your health care provider about whether you are at high risk of being infected with HIV. If you choose to begin PrEP, you should first be tested for HIV. You should then be tested every 3 months for as long as you are taking PrEP.  PREGNANCY   If you are premenopausal and you may become pregnant, ask your health care provider about preconception counseling.  If you may become pregnant, take 400 to 800 micrograms (mcg) of folic acid every day.  If you want to prevent pregnancy, talk to your health care provider about birth control (contraception). OSTEOPOROSIS AND MENOPAUSE   Osteoporosis is a disease in which the bones lose minerals and strength with aging. This can result in serious bone fractures. Your risk for osteoporosis can be identified using a bone density scan.  If you are 25 years of age or older, or if you are at risk for osteoporosis and fractures, ask your health care provider if you should be screened.  Ask your health care provider whether you should take a calcium or vitamin D supplement to lower your risk for osteoporosis.  Menopause may have certain physical symptoms and risks.  Hormone replacement therapy may reduce some of these symptoms and risks. Talk to your health care provider about whether hormone replacement therapy is right for you.  HOME CARE INSTRUCTIONS    Schedule regular health, dental, and eye exams.  Stay current with your immunizations.   Do not use any tobacco products including cigarettes, chewing tobacco, or electronic cigarettes.  If you are pregnant, do not drink alcohol.  If you are breastfeeding, limit how much and how often you drink alcohol.  Limit alcohol intake to no more than 1 drink per day for nonpregnant women. One drink equals 12 ounces of beer, 5 ounces of wine, or 1 ounces of hard liquor.  Do not use street drugs.  Do not share needles.  Ask your health care provider for help if you need support or information about quitting drugs.  Tell your health care provider if you often feel depressed.  Tell your health care provider if you have ever been abused or do not feel safe at home.   This information is not intended to replace advice given to you by your health care provider. Make sure you discuss any questions you have with your health care provider.   Document Released: 10/19/2010 Document Revised: 04/26/2014 Document Reviewed: 03/07/2013 Elsevier Interactive Patient Education [employer].

## 2015-07-29 ENCOUNTER — Other Ambulatory Visit: Payer: Self-pay | Admitting: Internal Medicine

## 2015-07-29 NOTE — Telephone Encounter (Signed)
Rx refill sent to pharmacy. 

## 2015-08-04 ENCOUNTER — Ambulatory Visit (INDEPENDENT_AMBULATORY_CARE_PROVIDER_SITE_OTHER): Payer: Medicare Other | Admitting: Internal Medicine

## 2015-08-04 ENCOUNTER — Ambulatory Visit
Admission: RE | Admit: 2015-08-04 | Discharge: 2015-08-04 | Disposition: A | Discharge status: Home / Self Care | Payer: Medicare Other | Source: Ambulatory Visit | Attending: Internal Medicine | Admitting: Internal Medicine

## 2015-08-04 ENCOUNTER — Encounter: Payer: Self-pay | Admitting: Internal Medicine

## 2015-08-04 VITALS — BP 146/70 | HR 59 | Temp 98.0°F | Resp 12 | Ht 63.0 in | Wt 158.2 lb

## 2015-08-04 DIAGNOSIS — I10 Essential (primary) hypertension: Secondary | ICD-10-CM | POA: Diagnosis not present

## 2015-08-04 DIAGNOSIS — M25751 Osteophyte, right hip: Secondary | ICD-10-CM | POA: Insufficient documentation

## 2015-08-04 DIAGNOSIS — E038 Other specified hypothyroidism: Secondary | ICD-10-CM

## 2015-08-04 DIAGNOSIS — D500 Iron deficiency anemia secondary to blood loss (chronic): Secondary | ICD-10-CM | POA: Diagnosis not present

## 2015-08-04 DIAGNOSIS — E559 Vitamin D deficiency, unspecified: Secondary | ICD-10-CM | POA: Diagnosis not present

## 2015-08-04 DIAGNOSIS — E034 Atrophy of thyroid (acquired): Secondary | ICD-10-CM

## 2015-08-04 DIAGNOSIS — M25551 Pain in right hip: Principal | ICD-10-CM

## 2015-08-04 DIAGNOSIS — G8929 Other chronic pain: Secondary | ICD-10-CM

## 2015-08-04 DIAGNOSIS — R042 Hemoptysis: Secondary | ICD-10-CM | POA: Diagnosis not present

## 2015-08-04 DIAGNOSIS — Z7901 Long term (current) use of anticoagulants: Secondary | ICD-10-CM

## 2015-08-04 DIAGNOSIS — D509 Iron deficiency anemia, unspecified: Secondary | ICD-10-CM

## 2015-08-04 LAB — COMPREHENSIVE METABOLIC PANEL
ALT: 8 U/L (ref 0–35)
AST: 16 U/L (ref 0–37)
Albumin: 3.8 g/dL (ref 3.5–5.2)
Alkaline Phosphatase: 78 U/L (ref 39–117)
BUN: 18 mg/dL (ref 6–23)
CO2: 28 mEq/L (ref 19–32)
Calcium: 9.1 mg/dL (ref 8.4–10.5)
Chloride: 103 mEq/L (ref 96–112)
Creatinine, Ser: 1.23 mg/dL — ABNORMAL HIGH (ref 0.40–1.20)
GFR: 44.28 mL/min — ABNORMAL LOW (ref >60.00)
Glucose, Bld: 89 mg/dL (ref 70–99)
Potassium: 4.5 mEq/L (ref 3.5–5.1)
Sodium: 138 mEq/L (ref 135–145)
Total Bilirubin: 0.5 mg/dL (ref 0.2–1.2)
Total Protein: 6.7 g/dL (ref 6.0–8.3)

## 2015-08-04 LAB — VITAMIN D 25 HYDROXY (VIT D DEFICIENCY, FRACTURES): VITD: 22.76 ng/mL — ABNORMAL LOW (ref 30.00–100.00)

## 2015-08-04 LAB — CBC WITH DIFFERENTIAL/PLATELET
Basophils Absolute: 0 10*3/uL (ref 0.0–0.1)
Basophils Relative: 0.4 % (ref 0.0–3.0)
Eosinophils Absolute: 0.2 10*3/uL (ref 0.0–0.7)
Eosinophils Relative: 2 % (ref 0.0–5.0)
HCT: 34.6 % — ABNORMAL LOW (ref 36.0–46.0)
Hemoglobin: 11.7 g/dL — ABNORMAL LOW (ref 12.0–15.0)
Lymphocytes Relative: 24.6 % (ref 12.0–46.0)
Lymphs Abs: 1.9 10*3/uL (ref 0.7–4.0)
MCHC: 33.7 g/dL (ref 30.0–36.0)
MCV: 89.5 fl (ref 78.0–100.0)
Monocytes Absolute: 0.7 10*3/uL (ref 0.1–1.0)
Monocytes Relative: 9.4 % (ref 3.0–12.0)
Neutro Abs: 4.9 10*3/uL (ref 1.4–7.7)
Neutrophils Relative %: 63.6 % (ref 43.0–77.0)
Platelets: 147 10*3/uL — ABNORMAL LOW (ref 150.0–400.0)
RBC: 3.86 Mil/uL — ABNORMAL LOW (ref 3.87–5.11)
RDW: 13.1 % (ref 11.5–15.5)
WBC: 7.7 10*3/uL (ref 4.0–10.5)

## 2015-08-04 LAB — APTT: aPTT: 32.3 s (ref 23.4–32.7)

## 2015-08-04 LAB — TSH: TSH: 2.57 u[IU]/mL (ref 0.35–4.50)

## 2015-08-04 LAB — PROTIME-INR
INR: 1.6 ratio — ABNORMAL HIGH (ref 0.8–1.0)
Prothrombin Time: 16.7 s — ABNORMAL HIGH (ref 9.6–13.1)

## 2015-08-04 NOTE — Patient Instructions (Signed)
I am referring you to Dr Margaretha Sheffield , (ear nose and Throat) to investigate your report of "blood in mouth" because I think it may be coming for your sinuses   I want you to start walking every day for a total of 15 minutes,  You can split it up   I have also ordered x rays of your right hip

## 2015-08-04 NOTE — Progress Notes (Signed)
Subjective:  [name], female    DOB: 1930/03/05  Age: 82 y.o. MRN: [id number]  CC: The primary encounter diagnosis was Hemoptysis. Diagnoses of Long term current use of anticoagulant therapy, Hypothyroidism due to acquired atrophy of thyroid, Vitamin D deficiency, Essential hypertension, Anemia due to chronic blood loss, Hip pain, chronic, right, Right hip pain, and Iron deficiency anemia were also pertinent to this visit.  HPI Alexandra Martinez presents for 6 month follow up  On hypothyroidism, atrial fibrillation, chronic LBP s/p laminectomy with spinal fusion Nov  2015 by  [facility]  back pain still present.  Pain is aggravated by housework  and prolonged standing/walking  . Uses  tramadol prn  Occasional constipation managed with prn miralax.  Not very hungry, but gaining weight.    Weight gain addressed.  Not walking for exercise,  Afraid to walk alone since her dog passed.  Right hip starts to ache and feel weak.Orpha Bur out frequently and feels she overeats.  [employer],  [employer],  [employer]  Does not get dessert.  Drinks sweet tea..    Dg hip right side discussed to rule out DJD.  Niece still checking in on her daily , gets "bossy"  Vit d deficiency: She is not taking a   D supplement or a calcium supplement   Now taking 20 mg Xarelto daily.  Occasionally wakes up with blood in mouth and feels she may have a dental problem, also feels she has developed "knots"  In her  neck on left side   .[email or web address]  Outpatient Prescriptions Prior to Visit  Medication Sig Dispense Refill  . acetaminophen (TYLENOL) 500 MG tablet Take 500 mg by mouth every 6 (six) hours as needed for headache.    . Esomeprazole Magnesium (NEXIUM PO) Take 22.3 mg by mouth daily.    . fexofenadine (HM FEXOFENADINE HCL) 180 MG tablet Take 1 tablet (180 mg total) by mouth daily. 30 tablet 1  . levothyroxine (SYNTHROID, LEVOTHROID) 50 MCG tablet TAKE 1 TABLET BY MOUTH EVERY DAY 30 tablet 4  .  metoprolol (LOPRESSOR) 100 MG tablet TAKE 1 TABLET BY MOUTH TWICE DAILY 180 tablet 0  . mometasone (NASONEX) 50 MCG/ACT nasal spray Place 2 sprays into the nose daily. In each nostril 17 g 12  . ondansetron (ZOFRAN) 4 MG tablet TAKE 1 TABLET BY MOUTH EVERY 8 HOURS AS NEEDED FOR NAUSEA OR VOMITING 30 tablet 0  . polyethylene glycol powder (GLYCOLAX/MIRALAX) powder Take 17 g by mouth daily as needed for mild constipation. 3350 g 1  . Rivaroxaban (XARELTO) 15 MG TABS tablet Take 15 mg by mouth daily with supper.     . Simethicone (EQ GAS RELIEF) 125 MG CAPS Take 125 mg by mouth daily as needed (gas). Reported on 07/01/2015    . traMADol (ULTRAM) 50 MG tablet TAKE 1 TABLET BY MOUTH EVERY 8 HOURS AS NEEDED. 90 tablet 3  . ammonium lactate (AMLACTIN) 12 % cream Apply 1 g topically 2 (two) times daily. Reported on 08/04/2015    . ergocalciferol (DRISDOL) 50000 UNITS capsule Take 1 capsule (50,000 Units total) by mouth once a week. (Patient not taking: Reported on 08/04/2015) 12 capsule 0   No facility-administered medications prior to visit.    Review of Systems;  Patient denies headache, fevers, malaise, unintentional weight loss, skin rash, eye pain, sinus congestion and sinus pain, sore throat, dysphagia,  hemoptysis , cough, dyspnea, wheezing, chest pain, palpitations, orthopnea,  edema, abdominal pain, nausea, melena, diarrhea, constipation, flank pain, dysuria, hematuria, urinary  Frequency, nocturia, numbness, tingling, seizures,  Focal weakness, Loss of consciousness,  Tremor, insomnia, depression, anxiety, and suicidal ideation.      Objective:  BP 146/70 mmHg  Pulse 59  Temp(Src) 98 F (36.7 C) (Oral)  Resp 12  Ht 5\' 3"  (1.6 m)  Wt 158 lb 4 oz (71.782 kg)  BMI 28.04 kg/m2  SpO2 97%  BP Readings from Last 3 Encounters:  08/04/15 146/70  07/01/15 132/72  02/03/15 122/70    Wt Readings from Last 3 Encounters:  08/04/15 158 lb 4 oz (71.782 kg)  07/01/15 154 lb 6.4 oz (70.035 kg)    02/03/15 149 lb (67.586 kg)    General appearance: alert, cooperative and appears stated age Ears: normal TM's and external ear canals both ears Throat: lips, mucosa, and tongue normal; teeth and gums normal Neck: no adenopathy, no carotid bruit, supple, symmetrical, trachea midline and thyroid not enlarged, symmetric, no tenderness/mass/nodules Back: symmetric, no curvature. ROM normal. No CVA tenderness. Lungs: clear to auscultation bilaterally Heart: regular rate and rhythm, S1, S2 normal, no murmur, click, rub or gallop Abdomen: soft, non-tender; bowel sounds normal; no masses,  no organomegaly Pulses: 2+ and symmetric Skin: Skin color, texture, turgor normal. No rashes or lesions Lymph nodes: Cervical, supraclavicular, and axillary nodes normal.  No results found for: HGBA1C  Lab Results  Component Value Date   CREATININE 1.23* 08/04/2015   CREATININE 1.03 02/03/2015   CREATININE 0.95 01/02/2015    Lab Results  Component Value Date   WBC 7.7 08/04/2015   HGB 11.7* 08/04/2015   HCT 34.6* 08/04/2015   PLT 147.0* 08/04/2015   GLUCOSE 89 08/04/2015   CHOL 217* 07/16/2013   TRIG 88.0 07/16/2013   HDL 41.70 07/16/2013   LDLCALC 158* 07/16/2013   ALT 8 08/04/2015   AST 16 08/04/2015   NA 138 08/04/2015   K 4.5 08/04/2015   CL 103 08/04/2015   CREATININE 1.23* 08/04/2015   BUN 18 08/04/2015   CO2 28 08/04/2015   TSH 2.57 08/04/2015   INR 1.6* 08/04/2015    Dg Knee Ap/lat W/sunrise Left  01/02/2015  CLINICAL DATA:  Right knee pain episodically EXAM: LEFT KNEE 3 VIEWS; RIGHT KNEE 3 VIEWS COMPARISON:  None. FINDINGS: The bones are subjectively osteopenic. No fracture or dislocation. No suprapatellar effusion. Patella tracks appropriately. IMPRESSION: Negative. Electronically Signed   By: Conchita Paris M.D.   On: 01/02/2015 18:05   Dg Knee Ap/lat W/sunrise Right  01/02/2015  CLINICAL DATA:  Right knee pain episodically EXAM: LEFT KNEE 3 VIEWS; RIGHT KNEE 3 VIEWS  COMPARISON:  None. FINDINGS: The bones are subjectively osteopenic. No fracture or dislocation. No suprapatellar effusion. Patella tracks appropriately. IMPRESSION: Negative. Electronically Signed   By: Conchita Paris M.D.   On: 01/02/2015 18:05    Assessment & Plan:   Problem List Items Addressed This Visit    Hypothyroid    Thyroid function is normal.  No changes .  Lab Results  Component Value Date   TSH 2.57 08/04/2015           Relevant Orders   TSH (Completed)   Hemoptysis - Primary    Recurrent, waking up with blood in mouth.  ENT evaluation recommended to evaluate sinuses and throat given use of Eliquis       Relevant Orders   Ambulatory referral to ENT   Right hip pain    Aggravated by walking and  prolonged standing.  Has been less active because of her pain.   Plain films ordered to rule out DJD      Anemia    Chronic , mild unchanged.     Lab Results  Component Value Date   WBC 7.7 08/04/2015   HGB 11.7* 08/04/2015   HCT 34.6* 08/04/2015   MCV 89.5 08/04/2015   PLT 147.0* 08/04/2015         Vitamin D deficiency    Recurrent ,  will resume Drisdol for 3 more months.       Relevant Orders   VITAMIN D 25 Hydroxy (Vit-D Deficiency, Fractures) (Completed)   Long term current use of anticoagulant therapy   Relevant Orders   CBC with Differential/Platelet (Completed)   INR/PT (Completed)   PTT (Completed)    Other Visit Diagnoses    Essential hypertension        Relevant Orders    Comprehensive metabolic panel (Completed)    Anemia due to chronic blood loss        Relevant Orders    Iron and TIBC (Completed)    Hip pain, chronic, right        Relevant Orders    DG HIP UNILAT WITH PELVIS 2-3 VIEWS RIGHT (Completed)       I am having Ms. Britain maintain her Rivaroxaban, ammonium lactate, Simethicone, acetaminophen, Esomeprazole Magnesium (NEXIUM PO), mometasone, fexofenadine, polyethylene glycol powder, ergocalciferol, traMADol, ondansetron,  levothyroxine, and metoprolol.  No orders of the defined types were placed in this encounter.    There are no discontinued medications.  Follow-up: No Follow-up on file.   Crecencio Mc, MD

## 2015-08-04 NOTE — Progress Notes (Signed)
Pre-visit discussion using our clinic review tool. No additional management support is needed unless otherwise documented below in the visit note.  

## 2015-08-05 ENCOUNTER — Other Ambulatory Visit: Payer: Self-pay | Admitting: Internal Medicine

## 2015-08-05 DIAGNOSIS — M25551 Pain in right hip: Secondary | ICD-10-CM | POA: Insufficient documentation

## 2015-08-05 DIAGNOSIS — D649 Anemia, unspecified: Secondary | ICD-10-CM | POA: Insufficient documentation

## 2015-08-05 DIAGNOSIS — R944 Abnormal results of kidney function studies: Secondary | ICD-10-CM

## 2015-08-05 DIAGNOSIS — E559 Vitamin D deficiency, unspecified: Secondary | ICD-10-CM | POA: Insufficient documentation

## 2015-08-05 LAB — IRON AND TIBC
%SAT: 28 % (ref 11–50)
Iron: 90 ug/dL (ref 45–160)
TIBC: 321 ug/dL (ref 250–450)
UIBC: 231 ug/dL (ref 125–400)

## 2015-08-05 MED ORDER — ERGOCALCIFEROL 1.25 MG (50000 UT) PO CAPS
50000.0000 [IU] | ORAL_CAPSULE | ORAL | Status: DC
Start: 2015-08-05 — End: 2015-12-29

## 2015-08-05 NOTE — Assessment & Plan Note (Signed)
Chronic , mild unchanged.     Lab Results  Component Value Date   WBC 7.7 08/04/2015   HGB 11.7* 08/04/2015   HCT 34.6* 08/04/2015   MCV 89.5 08/04/2015   PLT 147.0* 08/04/2015

## 2015-08-05 NOTE — Assessment & Plan Note (Addendum)
Recurrent, waking up with blood in mouth.  ENT evaluation recommended to evaluate sinuses and throat given use of Eliquis

## 2015-08-05 NOTE — Assessment & Plan Note (Signed)
Recurrent ,  will resume Drisdol for 3 more months.

## 2015-08-05 NOTE — Assessment & Plan Note (Signed)
Aggravated by walking and prolonged standing.  Has been less active because of her pain.   Plain films ordered to rule out DJD

## 2015-08-05 NOTE — Assessment & Plan Note (Signed)
Thyroid function is normal.  No changes .  Lab Results  Component Value Date   TSH 2.57 08/04/2015

## 2015-08-06 ENCOUNTER — Encounter: Payer: Self-pay | Admitting: *Deleted

## 2015-08-13 ENCOUNTER — Other Ambulatory Visit: Payer: Self-pay | Admitting: Internal Medicine

## 2015-08-13 ENCOUNTER — Other Ambulatory Visit (INDEPENDENT_AMBULATORY_CARE_PROVIDER_SITE_OTHER): Payer: Medicare Other

## 2015-08-13 DIAGNOSIS — N183 Chronic kidney disease, stage 3 unspecified: Secondary | ICD-10-CM

## 2015-08-13 DIAGNOSIS — R944 Abnormal results of kidney function studies: Secondary | ICD-10-CM

## 2015-08-13 LAB — BASIC METABOLIC PANEL
BUN: 17 mg/dL (ref 6–23)
CO2: 30 mEq/L (ref 19–32)
Calcium: 9.1 mg/dL (ref 8.4–10.5)
Chloride: 104 mEq/L (ref 96–112)
Creatinine, Ser: 1.29 mg/dL — ABNORMAL HIGH (ref 0.40–1.20)
GFR: 41.91 mL/min — ABNORMAL LOW (ref >60.00)
Glucose, Bld: 94 mg/dL (ref 70–99)
Potassium: 4.3 mEq/L (ref 3.5–5.1)
Sodium: 140 mEq/L (ref 135–145)

## 2015-08-15 ENCOUNTER — Other Ambulatory Visit: Payer: Self-pay | Admitting: Internal Medicine

## 2015-08-18 NOTE — Telephone Encounter (Signed)
sent 

## 2015-08-25 ENCOUNTER — Other Ambulatory Visit (INDEPENDENT_AMBULATORY_CARE_PROVIDER_SITE_OTHER): Payer: Medicare Other

## 2015-08-25 ENCOUNTER — Telehealth: Payer: Self-pay | Admitting: Internal Medicine

## 2015-08-25 DIAGNOSIS — E559 Vitamin D deficiency, unspecified: Secondary | ICD-10-CM

## 2015-08-25 LAB — VITAMIN D 25 HYDROXY (VIT D DEFICIENCY, FRACTURES): VITD: 23.99 ng/mL — ABNORMAL LOW (ref 30.00–100.00)

## 2015-08-25 NOTE — Telephone Encounter (Signed)
Per verbal from MD scheduled patient for Vit D level , after talking with patient and patient at first stated she was taking Vit D daily and then corrected to weekly.

## 2015-08-25 NOTE — Telephone Encounter (Signed)
Pt  niece states that Alexandra Martinez  is very weak and  nauseous since starting Vit-D and would like a follow up appt.. Please advise where to schedule.. Thanks.Marland Kitchen

## 2015-09-01 DIAGNOSIS — I482 Chronic atrial fibrillation: Secondary | ICD-10-CM | POA: Diagnosis not present

## 2015-09-01 DIAGNOSIS — N183 Chronic kidney disease, stage 3 (moderate): Secondary | ICD-10-CM | POA: Diagnosis not present

## 2015-09-01 DIAGNOSIS — I420 Dilated cardiomyopathy: Secondary | ICD-10-CM | POA: Diagnosis not present

## 2015-09-01 DIAGNOSIS — I481 Persistent atrial fibrillation: Secondary | ICD-10-CM | POA: Diagnosis not present

## 2015-09-02 DIAGNOSIS — I10 Essential (primary) hypertension: Secondary | ICD-10-CM | POA: Diagnosis not present

## 2015-09-02 DIAGNOSIS — N183 Chronic kidney disease, stage 3 (moderate): Secondary | ICD-10-CM | POA: Diagnosis not present

## 2015-09-02 DIAGNOSIS — R809 Proteinuria, unspecified: Secondary | ICD-10-CM | POA: Diagnosis not present

## 2015-09-03 DIAGNOSIS — R042 Hemoptysis: Secondary | ICD-10-CM | POA: Diagnosis not present

## 2015-09-03 DIAGNOSIS — J311 Chronic nasopharyngitis: Secondary | ICD-10-CM | POA: Diagnosis not present

## 2015-09-03 DIAGNOSIS — J310 Chronic rhinitis: Secondary | ICD-10-CM | POA: Diagnosis not present

## 2015-09-09 DIAGNOSIS — J301 Allergic rhinitis due to pollen: Secondary | ICD-10-CM | POA: Diagnosis not present

## 2015-09-17 DIAGNOSIS — N183 Chronic kidney disease, stage 3 (moderate): Secondary | ICD-10-CM | POA: Diagnosis not present

## 2015-09-22 DIAGNOSIS — I10 Essential (primary) hypertension: Secondary | ICD-10-CM | POA: Diagnosis not present

## 2015-09-22 DIAGNOSIS — N183 Chronic kidney disease, stage 3 (moderate): Secondary | ICD-10-CM | POA: Diagnosis not present

## 2015-09-22 DIAGNOSIS — N2581 Secondary hyperparathyroidism of renal origin: Secondary | ICD-10-CM | POA: Diagnosis not present

## 2015-09-22 DIAGNOSIS — R809 Proteinuria, unspecified: Secondary | ICD-10-CM | POA: Diagnosis not present

## 2015-10-07 DIAGNOSIS — R042 Hemoptysis: Secondary | ICD-10-CM | POA: Diagnosis not present

## 2015-10-07 DIAGNOSIS — J311 Chronic nasopharyngitis: Secondary | ICD-10-CM | POA: Diagnosis not present

## 2015-10-29 ENCOUNTER — Other Ambulatory Visit: Payer: Self-pay | Admitting: Internal Medicine

## 2015-11-14 ENCOUNTER — Telehealth: Payer: Self-pay | Admitting: Internal Medicine

## 2015-11-14 NOTE — Telephone Encounter (Signed)
Ok to refill tramadol , bu without a signature they will not be able to pick it up today unless another provider will gisn

## 2015-11-14 NOTE — Telephone Encounter (Signed)
Pt niece called to follow up on pt medication.   Call niece @ [phone]. Thank you!

## 2015-11-14 NOTE — Telephone Encounter (Signed)
LOV 08/04/2015. Renaldo Fiddler, CMA

## 2015-11-17 NOTE — Telephone Encounter (Signed)
Pt's niece called. The medication is not at [facility]. Please call in again  traMADol (ULTRAM) 50 MG tablet.  Thanks

## 2015-11-17 NOTE — Telephone Encounter (Signed)
Called into the pharmacy, thanks

## 2015-11-27 ENCOUNTER — Other Ambulatory Visit: Payer: Self-pay | Admitting: Internal Medicine

## 2015-11-29 IMAGING — RF DG MYELOGRAPHY LUMBAR INJ LUMBOSACRAL
6 of 8 series · 6 of 8 positions shown · non-contrast
Comparison: Lumbar myelogram 09/03/2005. MRI lumbar spine
10/18/2013

CLINICAL DATA: Low back pain extending into the right lower
extremity.
TECHNIQUE: Contiguous axial images were obtained through the Lumbar spine after
the intrathecal infusion of infusion. Coronal and sagittal
reconstructions were obtained of the axial image sets.

[Series 1: (hospital) · 1 of 1 slices shown]
[im 1/1]
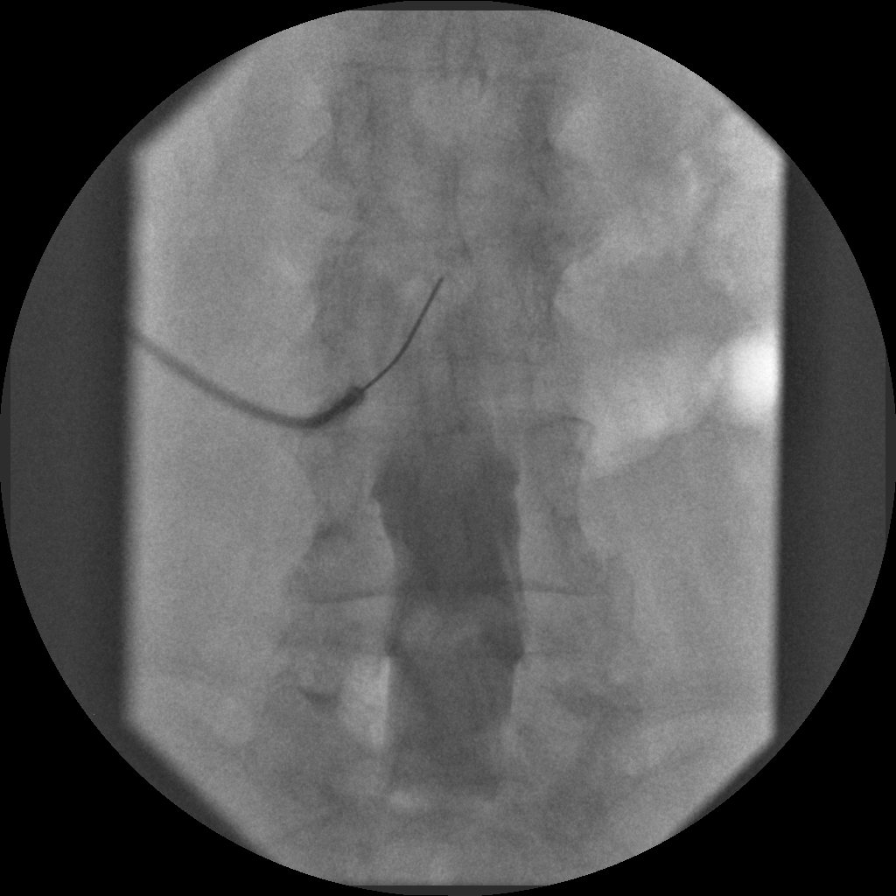

[Series 2: myelogram  white · 1 of 1 slices shown (1 of 2)]
[im 1/1]
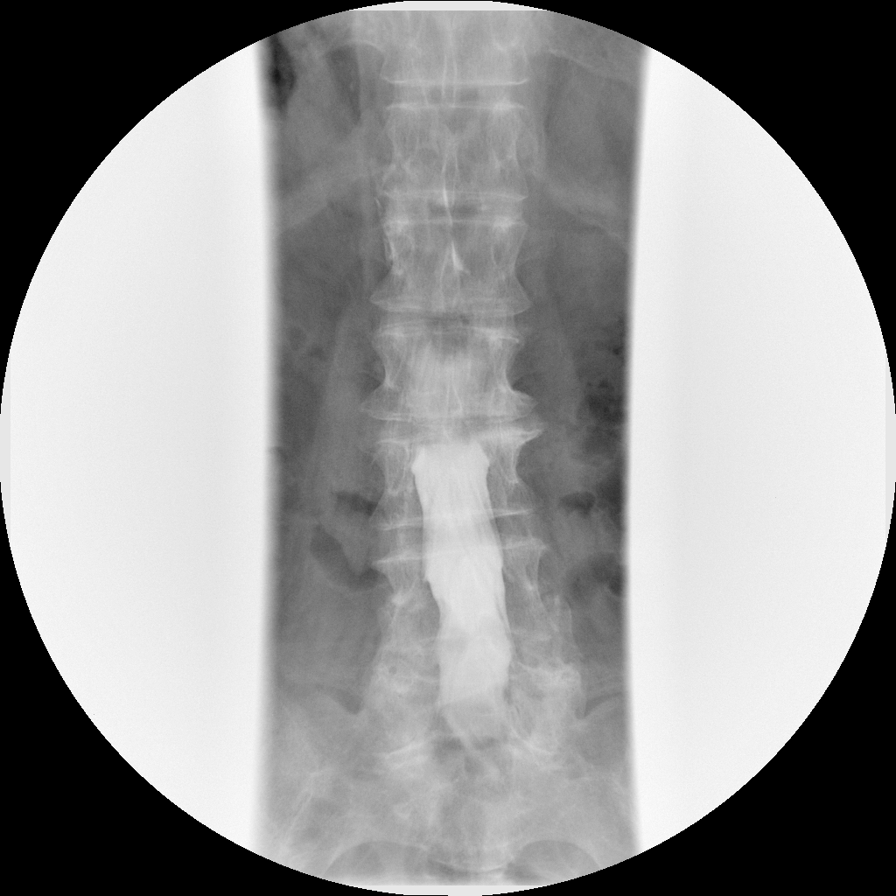

[Series 3: myelogram  white · 1 of 1 slices shown (2 of 2)]
[im 1/1]
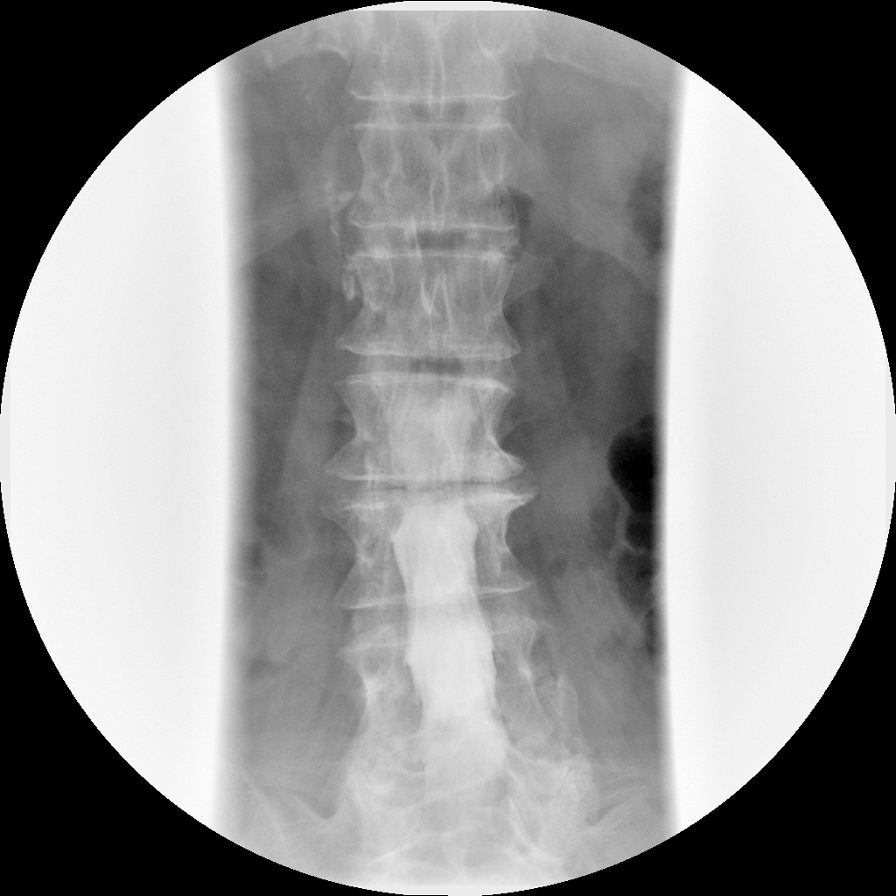

[Series 1001: view not recorded · 0.20mm/px · 1 of 1 slices shown (1 of 3)]
[im 1/1]
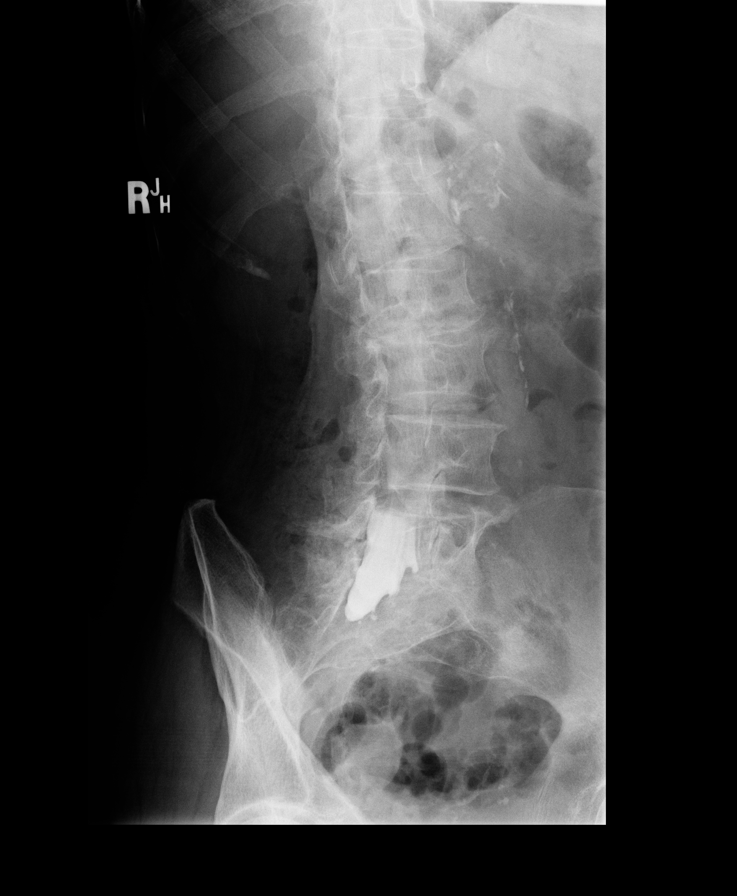

[Series 1002: view not recorded · 0.20mm/px · 1 of 1 slices shown (2 of 3)]
[im 1/1]
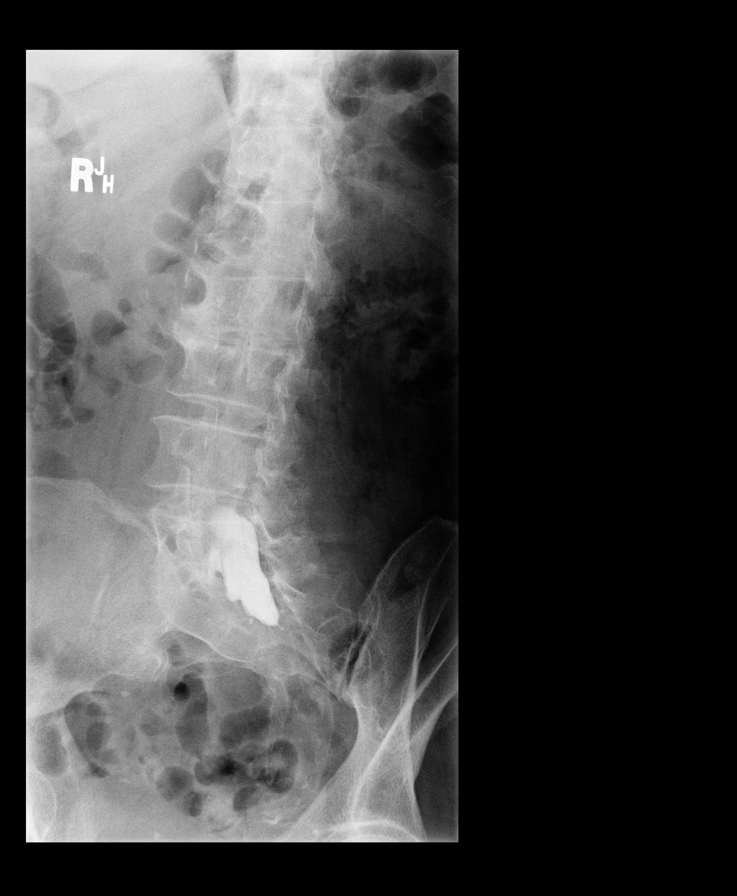

[Series 1003: view not recorded · 0.20mm/px · 1 of 1 slices shown (3 of 3)]
[im 1/1]
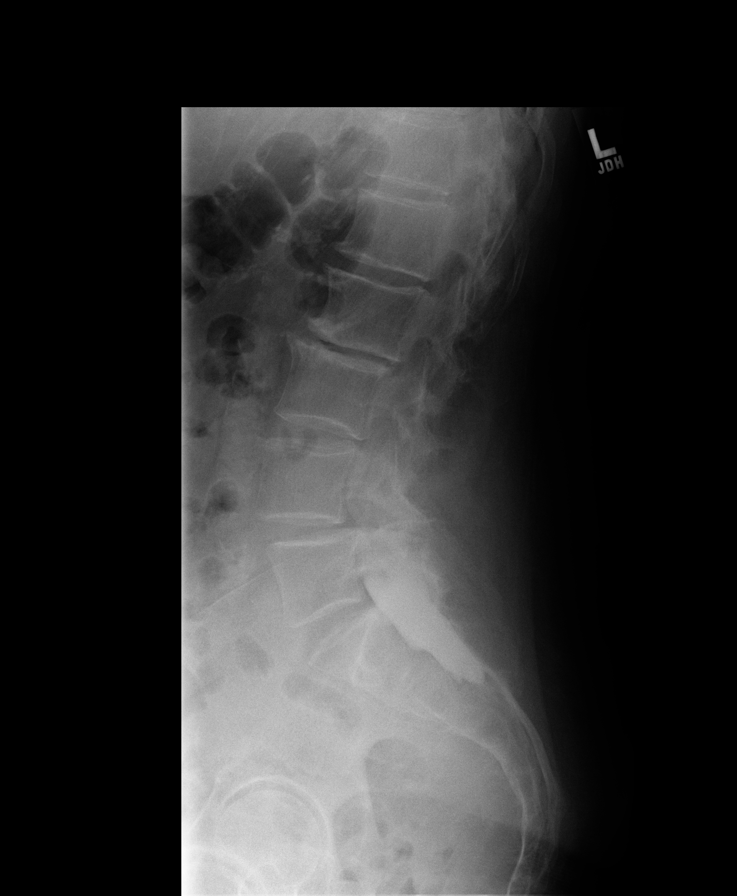

[6 of 8 positions shown; findings below may reference images not displayed]

EXAM:
LUMBAR MYELOGRAM

FLUOROSCOPY TIME:  33 seconds

PROCEDURE:
After thorough discussion of risks and benefits of the procedure
including bleeding, infection, injury to nerves, blood vessels,
adjacent structures as well as headache and CSF leak, written and
oral informed consent was obtained. Consent was obtained by Dr.
Nandi Maddux. Time out form was completed.

Patient was positioned prone on the fluoroscopy table. Local
anesthesia was provided with 1% lidocaine without epinephrine after
prepped and draped in the usual sterile fashion. Puncture was
performed at L2-3 using a 3 1/2 inch 22-gauge spinal needle via left
paramedian approach. Using a single pass through the dura, the
needle was placed within the thecal sac, with return of clear CSF.
15 mL of 7mnipaque-WTP was injected into the thecal sac, with normal
opacification of the nerve roots and cauda equina consistent with
free flow within the subarachnoid space.

I personally performed the lumbar puncture and administered the
intrathecal contrast. I also personally supervised acquisition of
the myelogram images.
FINDINGS: LUMBAR MYELOGRAM FINDINGS:

The C-arm failed during the exam and images could only be obtained
on the table in the diagnostic [place].

Contrast is somewhat limited above the L2-3 level, suggesting
significant stenosis. There is also mild narrowing at the L4-5 level
with sub particular stenosis bilaterally, worse on the left.

Lateral images demonstrate grade 1 anterolisthesis at L4-5. There is
no significant change with flexion or extension. Minimal
retrolisthesis is present at L2-3.

CT LUMBAR MYELOGRAM FINDINGS:

The lumbar spine is imaged from the midbody of T12 through S3 4.
Conus medullaris terminates at L2-3, the lower limits of normal.
Mild leftward curvature of the lumbar spine is centered at L2-3.
There is progressive loss of disc height at L2-3 on the right with
associated endplate change. Atherosclerotic calcifications are
present in the abdominal aorta without aneurysm.

T12-L1: Mild facet hypertrophy is present bilaterally without
significant stenosis.

L1-2: Mild facet hypertrophy is present bilaterally. There is no
significant focal disc protrusion or stenosis.

L2-3: Grade 1 retrolisthesis is associated with a broad-based disc
protrusion. Mild subarticular stenosis is worse on the right. Mild
foraminal narrowing is worse on the right as well.

L3-4: A mild broad-based disc bulging is present. Mild facet
hypertrophy is noted bilaterally. There is no focal stenosis.

L4-5: Moderate facet hypertrophy and spurring is noted bilaterally.
There is uncovering of a broad-based disc protrusion, asymmetric to
the left. Mild left foraminal narrowing is present. The central
canal and right foramen are patent.

L5-S1: Asymmetric right-sided facet hypertrophy is present. There is
no focal stenosis.
IMPRESSION: LUMBAR MYELOGRAM IMPRESSION:

1. Mild narrowing at L4-5 associated with grade 1 anterolisthesis.
2. Mild to moderate central canal stenosis at L2-3 associated with
retrolisthesis at this level.

CT LUMBAR MYELOGRAM IMPRESSION:

1. Multilevel facet hypertrophy.
2. Grade 1 retrolisthesis and broad-based disc protrusion results in
stable mild right subarticular and foraminal narrowing.
3. Mild left foraminal narrowing at L4-5.
4. Stable anterolisthesis at L4-5.
5. Asymmetric right-sided facet hypertrophy at L5-S1 without
significant stenosis.

## 2015-12-09 DIAGNOSIS — R0982 Postnasal drip: Secondary | ICD-10-CM | POA: Diagnosis not present

## 2015-12-09 DIAGNOSIS — J311 Chronic nasopharyngitis: Secondary | ICD-10-CM | POA: Diagnosis not present

## 2015-12-09 DIAGNOSIS — J3502 Chronic adenoiditis: Secondary | ICD-10-CM | POA: Diagnosis not present

## 2015-12-09 DIAGNOSIS — J301 Allergic rhinitis due to pollen: Secondary | ICD-10-CM | POA: Diagnosis not present

## 2015-12-15 IMAGING — CR DG CHEST 2V
2 series · 2 of 2 positions shown · non-contrast
Comparison: Prior chest x-ray 12/09/2009

CLINICAL DATA: Preoperative chest radiograph prior to planned to
level posterior lumbar interbody fusion.

EXAM:
CHEST  2 VIEW

[w chest pa]
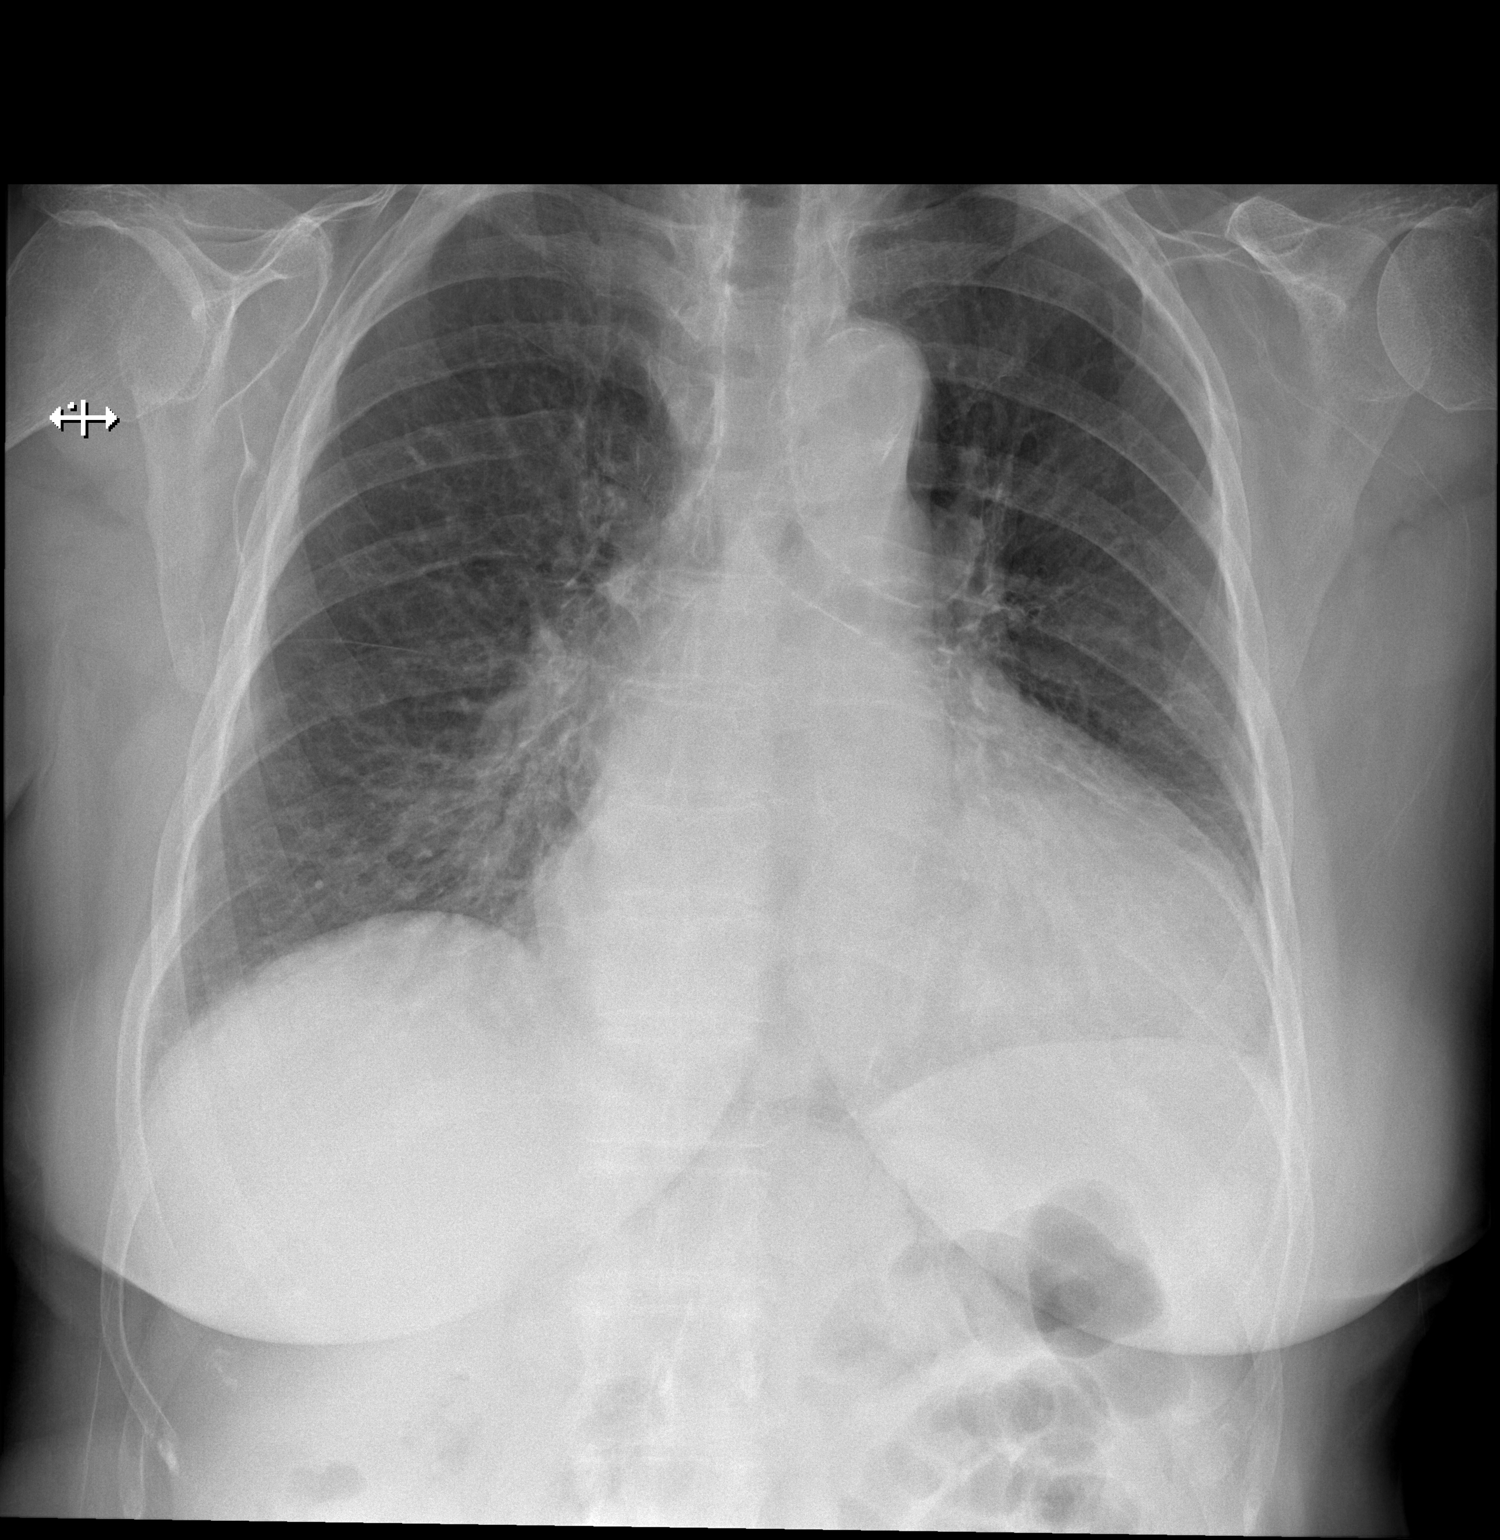

[w chest lat]
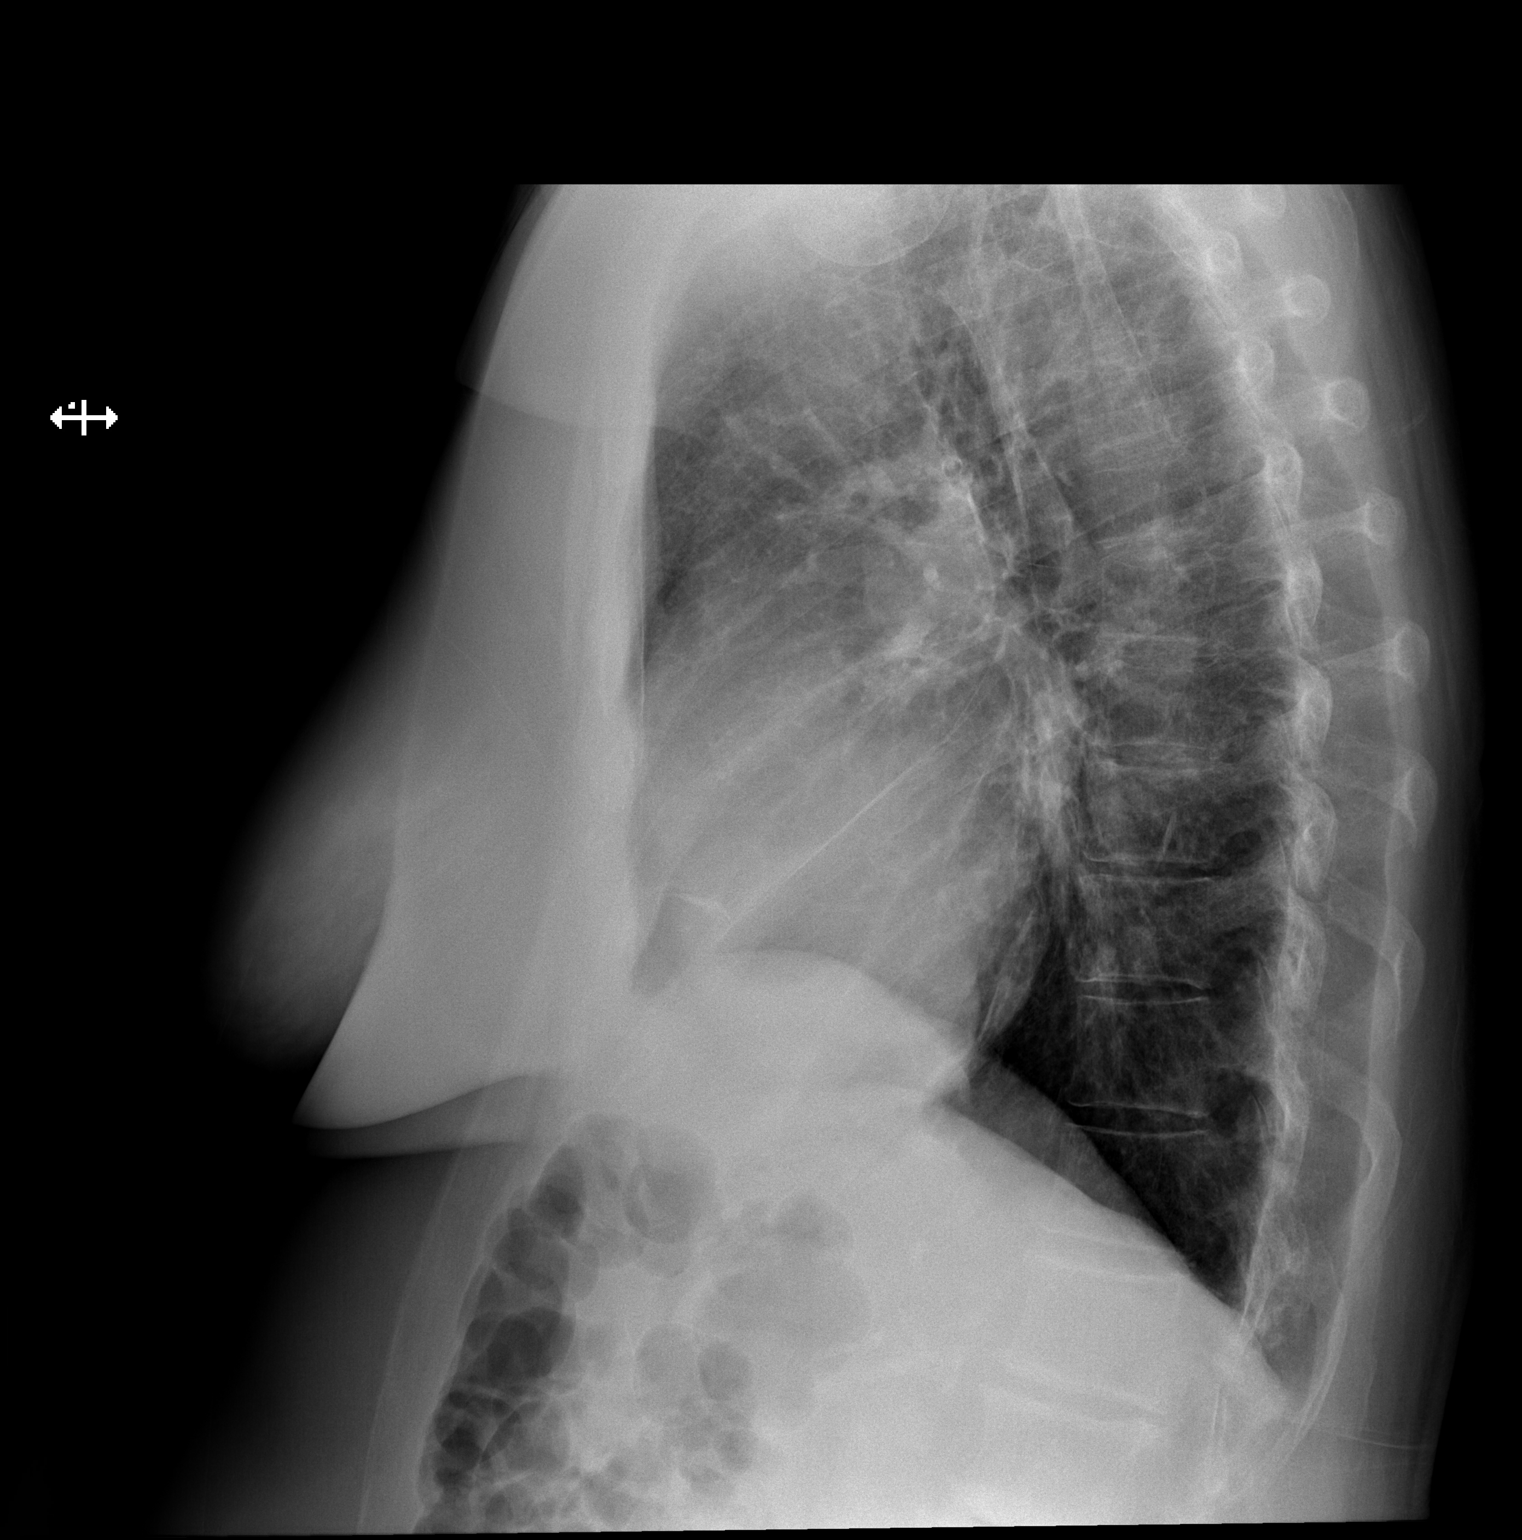

[2 of 2 positions shown; findings below may reference images not displayed]

FINDINGS: Stable cardiomegaly with left heart enlargement. Atherosclerotic
calcification presence throughout the aorta. Mild central bronchitic
change in interstitial prominence without significant interval
change dating back to 8988. No suspicious pulmonary nodule or mass.
No evidence of CHF. No acute osseous abnormality.
IMPRESSION: 1. No acute cardiopulmonary process.
2. Cardiomegaly with left ventricular enlargement has minimally
progressed compared to November 2009.
3. Aortic atherosclerosis.
4. Chronic bronchitic changes and mild interstitial prominence.

## 2015-12-24 DIAGNOSIS — E782 Mixed hyperlipidemia: Secondary | ICD-10-CM | POA: Diagnosis not present

## 2015-12-24 DIAGNOSIS — I482 Chronic atrial fibrillation: Secondary | ICD-10-CM | POA: Diagnosis not present

## 2015-12-24 DIAGNOSIS — I38 Endocarditis, valve unspecified: Secondary | ICD-10-CM | POA: Diagnosis not present

## 2015-12-26 ENCOUNTER — Inpatient Hospital Stay
Admission: RE | Admit: 2015-12-26 | Discharge: 2015-12-26 | Disposition: A | Discharge status: Home / Self Care | Payer: Medicare Other | Source: Ambulatory Visit

## 2015-12-26 NOTE — Pre-Procedure Instructions (Signed)
CARDIAC CLEARANCE ON CHART AND IN DUKES EPIC FROM DR C[place]urg

## 2015-12-26 NOTE — Pre-Procedure Instructions (Signed)
ECG 12-lead5/15/2017 [place] Component Name Value Ref Range  Vent Rate (bpm) 75   QRS Interval (msec) 72   QT Interval (msec) 394   QTc (msec) 439   Result Narrative  Atrial fibrillation with premature ventricular or aberrantly conducted complexes Septal infarct , age undetermined Abnormal ECG No previous ECGs available I reviewed and concur with this report. Electronically signed [id number] MD, Darnell Level [phone]) on 09/04/2015 12:52:15 PM  Status Results Details    Office Visit on 09/01/2015 [place]")' href="[email or web address]/">Encounter Summary

## 2015-12-26 NOTE — Pre-Procedure Instructions (Signed)
Alexandra Dibble, MD - 12/24/2015 11:30 AM EDT Formatting of this note may be different from the original. Established Patient Visit   Chief Complaint: Chief Complaint  Patient presents with  . Follow-up  4 mo, POC sinus surg  . Atrial Fibrillation  Date of Service: 12/24/2015 Date of Birth: [birth year] PCP: Alexandra Hick, MD  History of Present Illness: Alexandra Martinez is a 82 y.o.female patient  Valvular heart disease The patient has chronic mildly symptomatic valvular heart disease including mitral insufficiency and tricuspid Insufficiency with symptoms including dyspnea, fatigue and irregular heart beat worsening with increased severity at this time not requiring further intervention and/or a change in medication at this time. Mixed Hyperlipidemia The patient does have a diagnosis of mixed hyperlipidemia for which the patient is currently receiving Moderate intensity therapy with pravastatin (Pravachol) for risk factors that include high LDL and >7.5% ten year cardiovascular risk score. The patient has tolerated this treatment without significant side effects. They also understand the importance of diet and exercise and healthy lifestyle measures to reduce cardiovascular risk as well. The patient has been compliant  Atrial Fibrillation The patient has non-valvular atrial fibrillation Chronic rate controlled symptomatic with shortness of breath currently on metoprolol medication remaining in afib. The atrial fibrillation has occurred all of the time and can be associated with dizziness stable. The etiology of this atrial fibrillation includes Hypertension, valvular heart disease, sick sinus syndrome and structural heart disease and/or dysfunction. The identified symptoms associated with the patient's atrial fibrillation have affected the patients functionality and/or quality of life with a severity of atrial fibrillation scale of 1. The patient has been on anticoagulation  The patient has a  glomerular filtration rate of 41 ml/min/1.73 and therefore will need a reduced dose of xarelto  Preoperative Assessment Profile for sinus surgery Risk factors for cardiac complication with surgery: Positive for: Chronic kidney disease Negative for: Diabetes, Cardiomyopathy or chf, Coronary artery disease and Peripheral vascular disease Active cardiovascular conditions: Positive JOA:[id number] Negative for: Angina or anginal equivalent, Recent infartion, Congestive heart failure symptoms and Symptomatic valve disease Functional capacity >4 Mets Risk of surgery and/or procedure low risk Possible need and adjustments in therapy prior to surgery no Overall risk of cardiac complication with surgery and/or procedure is low, <1%  Past Medical and Surgical History  Past Medical History Past Medical History:  Diagnosis Date  . Atrial fibrillation (CMS-HCC)  . Chronic neck and back pain  . Depression  . GERD (gastroesophageal reflux disease)  . Hyperlipidemia  . IBS (irritable bowel syndrome)  . Thyroid disease   Past Surgical History She has a past surgical history that includes Thyroid nodule removal; Left breast node biopsy; Hysterectomy; Cholecystectomy; Back surgery; Cataract extraction; Colonoscopy (07/09/2002, 09/06/2006); and Appendectomy.   Medications and Allergies  Current Medications  Current Outpatient Prescriptions  Medication Sig Dispense Refill  . cranberry 500 mg Cap Take 500 mg by mouth 2 (two) times daily. PRN  . esomeprazole (NEXIUM) 20 MG DR capsule Take 20 mg by mouth once daily.  Marland Kitchen levothyroxine (SYNTHROID, LEVOTHROID) 50 MCG tablet Take 50 mcg by mouth once daily. Take on an empty stomach with a glass of water at least 30-60 minutes before breakfast.  . loratadine (CLARITIN) 10 mg tablet Take 10 mg by mouth as needed.  Marland Kitchen losartan (COZAAR) 25 MG tablet TK 1 T PO QD 6  . metoprolol tartrate (LOPRESSOR) 100 MG tablet Take 100 mg by mouth 2 (two) times daily.  .  mometasone (NASONEX) 50  mcg/actuation nasal spray by Nasal route continuously as needed.  . pravastatin (PRAVACHOL) 20 MG tablet TAKE 1 TABLET(20 MG) BY MOUTH EVERY NIGHT 90 tablet 11  . rivaroxaban (XARELTO) 15 mg tablet Take 1 tablet (15 mg total) by mouth once daily. 30 tablet 11  . traMADol (ULTRAM) 50 mg tablet Take by mouth.   No current facility-administered medications for this visit.   Allergies: Celebrex [celecoxib]; Claritin-d 12 hour [loratadine-pseudoephedrine]; Effexor [venlafaxine]; Vioxx [rofecoxib]; Apixaban; Codeine; and Dexlansoprazole  Social and Family History  Social History reports that she has never smoked. She has never used smokeless tobacco. She reports that she does not drink alcohol or use illicit drugs.  Family History Family History  Problem Relation Age of Onset  . Uterine cancer Mother  . Heart attack Father  . Heart disease Sister  . Heart failure Brother  . Lung disease Brother  . Hypertension Other   Review of Systems   Review of Systems  Positive for edema Negative for weight gain weight loss, weakness, vision change, hearing loss, cough, congestion, PND, orthopnea, heartburn, nausea, diaphoresis, vomiting, diarrhea, bloody stool, melena, stomach pain, leg weakness, leg cramping, leg blood clots, headache, blackouts, nosebleed, trouble swallowing, mouth pain, urinary frequency, urination at night, muscle weakness, skin lesions, skin rashes, tingling ,ulcers, numbness, anxiety, and/or depression Physical Examination   Vitals:BP 130/72  Pulse 66  Resp 14  Ht 162.6 cm (5\' 4" )  Wt 70.8 kg (156 lb)  SpO2 98%  BMI 26.78 kg/m2 Ht:162.6 cm (5\' 4" ) Wt:70.8 kg (156 lb) WIO:XBDZ surface area is 1.79 meters squared. Body mass index is 26.78 kg/(m^2). Appearance: well appearing in no acute distress HEENT: Pupils equally reactive to light and accomodation, no xanthalasma  Neck: Supple, no apparent thyromegaly, masses, or lymphadenopathy  Lungs: normal  respiratory effort; no crackles, no rhonchi, no wheezes Heart: irregular rate and rhythm. Normal S1 S2 No gallops, murmur, no rub, PMI is normal size and placement. carotid upstroke normal without bruit. Jugular venous pressure is normal Abdomen: soft, nontender, not distended with normal bowel sounds. Abdominal aorta is normal size without bruit Extremities: 1+edema, no ulcers, no clubbing, no cyanosis Peripheral Pulses: 2+ in upper extremities, 2+ femoral pulses bilaterally, 2+lower extremity  Musculoskeletal; Normal muscle tone without kyphosis Neurological: Oriented and Alert, Cranial nerves intact  Assessment   82 y.o. female with  Encounter Diagnoses  Name Primary?  . Chronic a-fib (CMS-HCC) Yes  . Valvular heart disease  . Mixed hyperlipidemia   Plan  -Proceed to surgery and/or invasive procedure without restriction. The patient is at the lowest risk possible for cardiovascular complication as stated above. Patient may discontinue anticoagulation 3 days prior to surgical intervention and or procedure for reduced bleeding risk. The patient shall restart at a safe period after procedure when bleeding risk is reduced. Patient understands all the risks and benefits of anticoagulation and discontinuation of anticoagulation perioperatively and the quoted study data that applies  -No further cardiac intervention of valvular heart disease due to no significant recent and or new symptoms requiring a change in medication management and clinically stable at this time. -we have discussed risk reduction in cardiovascular disease process by a continuation of lipid management with current medication management for lipid reduction. The goals continue to be 30-50% lowering of LDL cholesterol in addition of lifestyle measures. The patient has an understanding of this discussion at this time and we will continue the appropriate strategy. -Continue current treatment plan for heart rate control of atrial  fibrillation and/or  maintenance of normal sinus rhythm. There appears to be no current evidence of sick sinus syndrome and/or significant worsening tachycardia and/or heart block. The patient will watch closely for any new significant symptoms of dizziness, weakness, palpitations, and/or need for adjustments of medication management. -rivaroxaban will be used for risk reduction of stroke with atrial fibrillation/flutter. The patient has been informed on all risks and benefits including risk of bleeding, bruising, stroke, and medication interaction. The patient will have regular monitor of possible chronic kidney disease and or anemia. We will reduce dose due to low gfr  No orders of the defined types were placed in this encounter.  Return in about 6 months (around 06/22/2016).  Alexandra Dibble, MD       Plan of Treatment - as of this encounter  Not on file   Visit Diagnoses - in this encounter   Diagnosis  Chronic a-fib (CMS-HCC) - Primary  Atrial fibrillation   Valvular heart disease  Endocarditis, valve unspecified, unspecified cause   Mixed hyperlipidemia

## 2015-12-29 ENCOUNTER — Encounter: Payer: Self-pay | Admitting: *Deleted

## 2015-12-29 NOTE — Patient Instructions (Signed)
  Your procedure is scheduled on: 01-05-16 [facility]) Report to Same Day Surgery 2nd floor medical mall To find out your arrival time please call [phone] between 1PM - 3PM on 01-02-16 (FRIDAY)  Remember: Instructions that are not followed completely may result in serious medical risk, up to and including death, or upon the discretion of your surgeon and anesthesiologist your surgery may need to be rescheduled.    _x___ 1. Do not eat food or drink liquids after midnight. No gum chewing or hard candies.     __x__ 2. No Alcohol for 24 hours before or after surgery.   __x__3. No Smoking for 24 prior to surgery.   ____  4. Bring all medications with you on the day of surgery if instructed.    __x__ 5. Notify your doctor if there is any change in your medical condition     (cold, fever, infections).     Do not wear jewelry, make-up, hairpins, clips or nail polish.  Do not wear lotions, powders, or perfumes. You may wear deodorant.  Do not shave 48 hours prior to surgery. Men may shave face and neck.  Do not bring valuables to the hospital.    [facility] is not responsible for any belongings or valuables.               Contacts, dentures or bridgework may not be worn into surgery.  Leave your suitcase in the car. After surgery it may be brought to your room.  For patients admitted to the hospital, discharge time is determined by your treatment team.   Patients discharged the day of surgery will not be allowed to drive home.    Please read over the following fact sheets that you were given:   [facility] Preparing for Surgery and or MRSA Information   _x___ Take these medicines the morning of surgery with A SIP OF WATER:    1. LEVOTHYROXINE (SYNTHROID)  2. LOSARTAN (COZAAR)  3. METOPROLOL  4. NEXIUM  5. TAKE A NEXIUM THE NIGHT BEFORE YOUR SURGERY  6.  ____ Fleet Enema (as directed)   ____ Use CHG Soap or sage wipes as directed on instruction sheet   ____ Use inhalers on the  day of surgery and bring to hospital day of surgery  ____ Stop metformin 2 days prior to surgery    ____ Take 1/2 of usual insulin dose the night before surgery and none on the morning of surgery.   _X___ Stop aspirin or coumadin, or plavix-STOP XARELTO 3 DAYS PRIOR TO SURGERY-LAST DOSE ON Thursday, 01-01-16  _x__ Stop Anti-inflammatories such as Advil, Aleve, Ibuprofen, Motrin, Naproxen,          Naprosyn, Goodies powders or aspirin products. Ok to take Tylenol OR TRAMADOL   ____ Stop supplements until after surgery.    ____ Bring C-Pap to the hospital.

## 2015-12-30 ENCOUNTER — Encounter
Admission: RE | Admit: 2015-12-30 | Discharge: 2015-12-30 | Disposition: A | Discharge status: Home / Self Care | Payer: Medicare Other | Source: Ambulatory Visit | Attending: Otolaryngology | Admitting: Otolaryngology

## 2015-12-30 DIAGNOSIS — Z01812 Encounter for preprocedural laboratory examination: Secondary | ICD-10-CM | POA: Insufficient documentation

## 2015-12-30 LAB — BASIC METABOLIC PANEL
Anion gap: 6 (ref 5–15)
BUN: 17 mg/dL (ref 6–20)
CO2: 27 mmol/L (ref 22–32)
Calcium: 9.1 mg/dL (ref 8.9–10.3)
Chloride: 107 mmol/L (ref 101–111)
Creatinine, Ser: 1.04 mg/dL — ABNORMAL HIGH (ref 0.44–1.00)
GFR calc Af Amer: 56 mL/min — ABNORMAL LOW (ref >60)
GFR calc non Af Amer: 48 mL/min — ABNORMAL LOW (ref >60)
Glucose, Bld: 89 mg/dL (ref 65–99)
Potassium: 4.5 mmol/L (ref 3.5–5.1)
Sodium: 140 mmol/L (ref 135–145)

## 2015-12-30 LAB — CBC
HCT: 35.6 % (ref 35.0–47.0)
Hemoglobin: 12.3 g/dL (ref 12.0–16.0)
MCH: 31.3 pg (ref 26.0–34.0)
MCHC: 34.7 g/dL (ref 32.0–36.0)
MCV: 90.1 fL (ref 80.0–100.0)
Platelets: 142 10*3/uL — ABNORMAL LOW (ref 150–440)
RBC: 3.95 MIL/uL (ref 3.80–5.20)
RDW: 13.1 % (ref 11.5–14.5)
WBC: 6.6 10*3/uL (ref 3.6–11.0)

## 2016-01-02 ENCOUNTER — Encounter: Payer: Self-pay | Admitting: *Deleted

## 2016-01-05 ENCOUNTER — Ambulatory Visit
Admission: RE | Admit: 2016-01-05 | Discharge: 2016-01-05 | Disposition: A | Discharge status: Home / Self Care | Payer: Medicare Other | Source: Ambulatory Visit | Attending: Otolaryngology | Admitting: Otolaryngology

## 2016-01-05 ENCOUNTER — Encounter: Payer: Self-pay | Admitting: Anesthesiology

## 2016-01-05 ENCOUNTER — Ambulatory Visit: Payer: Medicare Other | Admitting: Certified Registered Nurse Anesthetist

## 2016-01-05 ENCOUNTER — Encounter
Admission: RE | Disposition: A | Discharge status: Home / Self Care | Payer: Self-pay | Source: Ambulatory Visit | Attending: Otolaryngology

## 2016-01-05 DIAGNOSIS — I10 Essential (primary) hypertension: Secondary | ICD-10-CM | POA: Insufficient documentation

## 2016-01-05 DIAGNOSIS — E039 Hypothyroidism, unspecified: Secondary | ICD-10-CM | POA: Insufficient documentation

## 2016-01-05 DIAGNOSIS — E7800 Pure hypercholesterolemia, unspecified: Secondary | ICD-10-CM | POA: Insufficient documentation

## 2016-01-05 DIAGNOSIS — J00 Acute nasopharyngitis [common cold]: Secondary | ICD-10-CM | POA: Diagnosis not present

## 2016-01-05 DIAGNOSIS — J311 Chronic nasopharyngitis: Secondary | ICD-10-CM | POA: Diagnosis present

## 2016-01-05 DIAGNOSIS — Z7901 Long term (current) use of anticoagulants: Secondary | ICD-10-CM | POA: Insufficient documentation

## 2016-01-05 DIAGNOSIS — K219 Gastro-esophageal reflux disease without esophagitis: Secondary | ICD-10-CM | POA: Diagnosis not present

## 2016-01-05 DIAGNOSIS — I4891 Unspecified atrial fibrillation: Secondary | ICD-10-CM | POA: Insufficient documentation

## 2016-01-05 DIAGNOSIS — J3502 Chronic adenoiditis: Secondary | ICD-10-CM | POA: Diagnosis not present

## 2016-01-05 DIAGNOSIS — Z79899 Other long term (current) drug therapy: Secondary | ICD-10-CM | POA: Diagnosis not present

## 2016-01-05 DIAGNOSIS — J352 Hypertrophy of adenoids: Secondary | ICD-10-CM | POA: Diagnosis not present

## 2016-01-05 HISTORY — DX: Cardiac arrhythmia, unspecified: I49.9

## 2016-01-05 HISTORY — PX: ADENOIDECTOMY: SHX5191

## 2016-01-05 HISTORY — PX: NASOPHARYNGOSCOPY: SHX5210

## 2016-01-05 SURGERY — ADENOIDECTOMY
Anesthesia: General | Wound class: Clean Contaminated

## 2016-01-05 MED ORDER — TRAMADOL HCL 50 MG PO TABS
ORAL_TABLET | ORAL | Status: AC
  Administered 2016-01-05: 50 mg via ORAL
  Filled 2016-01-05: qty 1

## 2016-01-05 MED ORDER — LACTATED RINGERS IV SOLN
INTRAVENOUS | Status: DC
  Administered 2016-01-05: 07:00:00 via INTRAVENOUS

## 2016-01-05 MED ORDER — FENTANYL CITRATE (PF) 100 MCG/2ML IJ SOLN
25.0000 ug | INTRAMUSCULAR | Status: DC | PRN
  Administered 2016-01-05 (×4): 25 ug via INTRAVENOUS

## 2016-01-05 MED ORDER — FENTANYL CITRATE (PF) 100 MCG/2ML IJ SOLN
INTRAMUSCULAR | Status: DC | PRN
  Administered 2016-01-05 (×2): 50 ug via INTRAVENOUS

## 2016-01-05 MED ORDER — SILVER NITRATE-POT NITRATE 75-25 % EX MISC
CUTANEOUS | Status: AC
  Filled 2016-01-05: qty 1

## 2016-01-05 MED ORDER — TRAMADOL HCL 50 MG PO TABS
50.0000 mg | ORAL_TABLET | Freq: Four times a day (QID) | ORAL | Status: DC | PRN
  Administered 2016-01-05: 50 mg via ORAL

## 2016-01-05 MED ORDER — OXYMETAZOLINE HCL 0.05 % NA SOLN
NASAL | Status: DC | PRN
  Administered 2016-01-05: 2 via NASAL

## 2016-01-05 MED ORDER — LIDOCAINE HCL (CARDIAC) 20 MG/ML IV SOLN
INTRAVENOUS | Status: DC | PRN
  Administered 2016-01-05: 50 mg via INTRAVENOUS

## 2016-01-05 MED ORDER — DEXAMETHASONE SODIUM PHOSPHATE 10 MG/ML IJ SOLN
INTRAMUSCULAR | Status: DC | PRN
  Administered 2016-01-05: 10 mg via INTRAVENOUS

## 2016-01-05 MED ORDER — MIDAZOLAM HCL 2 MG/2ML IJ SOLN
INTRAMUSCULAR | Status: DC | PRN
  Administered 2016-01-05: 1 mg via INTRAVENOUS

## 2016-01-05 MED ORDER — FENTANYL CITRATE (PF) 100 MCG/2ML IJ SOLN
INTRAMUSCULAR | Status: AC
  Administered 2016-01-05: 25 ug via INTRAVENOUS
  Filled 2016-01-05: qty 2

## 2016-01-05 MED ORDER — PROPOFOL 10 MG/ML IV BOLUS
INTRAVENOUS | Status: DC | PRN
  Administered 2016-01-05: 10 mg via INTRAVENOUS
  Administered 2016-01-05: 20 mg via INTRAVENOUS
  Administered 2016-01-05: 120 mg via INTRAVENOUS

## 2016-01-05 MED ORDER — ONDANSETRON HCL 4 MG/2ML IJ SOLN
INTRAMUSCULAR | Status: DC | PRN
  Administered 2016-01-05: 4 mg via INTRAVENOUS

## 2016-01-05 MED ORDER — SILVER NITRATE-POT NITRATE 75-25 % EX MISC
CUTANEOUS | Status: DC | PRN
  Administered 2016-01-05: 1

## 2016-01-05 MED ORDER — SUCCINYLCHOLINE CHLORIDE 20 MG/ML IJ SOLN
INTRAMUSCULAR | Status: DC | PRN
  Administered 2016-01-05: 100 mg via INTRAVENOUS

## 2016-01-05 MED ORDER — ONDANSETRON HCL 4 MG/2ML IJ SOLN: 4.0000 mg | Freq: Once | INTRAMUSCULAR | Status: DC | PRN

## 2016-01-05 SURGICAL SUPPLY — 11 items
CANISTER SUCT 1200ML W/VALVE (MISCELLANEOUS) ×3 IMPLANT
COAG SUCT 10F 3.5MM HAND CTRL (MISCELLANEOUS) ×3 IMPLANT
DRSG TELFA 3X8 NADH (GAUZE/BANDAGES/DRESSINGS) ×3 IMPLANT
ELECT REM PT RETURN 9FT ADLT (ELECTROSURGICAL) ×3
ELECTRODE REM PT RTRN 9FT ADLT (ELECTROSURGICAL) ×2 IMPLANT
GLOVE PROTEXIS LATEX SZ 7.5 (GLOVE) ×6 IMPLANT
GOWN STRL REUS W/ TWL LRG LVL3 (GOWN DISPOSABLE) ×4 IMPLANT
GOWN STRL REUS W/TWL LRG LVL3 (GOWN DISPOSABLE) ×2
NS IRRIG 500ML POUR BTL (IV SOLUTION) ×3 IMPLANT
PACK HEAD/NECK (MISCELLANEOUS) ×3 IMPLANT
SPONGE TONSIL 1 RF SGL (DISPOSABLE) ×3 IMPLANT

## 2016-01-05 NOTE — Op Note (Signed)
01/05/2016  8:21 AM    Charma Igo  [id number]   Pre-Op Dx:  Chronic adenoiditis  Post-op Dx: Chronic adenoiditis  Proc: Adenoidectomy , direct nasopharyngoscopy  Surg:  Keysean Savino H  Anes:  GOT  EBL:  20 mL  Comp:  None  Findings:  Small pocket in the middle of the adenoid tissue with a green crust overlying this. Inflamed adenoid tissue on both sides of this.  Procedure: The patient was given general anesthesia by oral endotracheal intubation. A Davis mouth gag was used to visualize the oropharynx. Mucosa was healthy and there is no sign of inflammation anywhere. The soft palate was retracted and a mirror was used to visualize the nasopharynx. There was a depression near the adenoid area that made it difficult to see. There was a purulent crust that was overlying the midportion of the adenoids. I used a [employer] forceps and was able to remove the green crust but unable to reach any of the adenoid tissue on both sides.  A 0 scope was then placed through the left nostril to visualize the hypopharynx. I placed a suction through the right nostril be able to see these areas better. The small pocket in the midportion of the adenoids went down to the muscular tissue overlying the cervical spine. There was swollen lymphoid tissue on both sides of it. It bled very easily. I placed a biopsy forceps through the right nostril and looking with the scope in the left nostril was able remove adenoid tissue from both sides of the midline hole. After I widened this out to see better, small bits of tissue removed from the muscle bed in the midline for biopsy as well. Adenoid tissue was removed inferiorly as well to allow better drainage from this area instead of mucus just sitting here. When I finished there was a distal out area in the middle that was flatter with no pockets. A suction cautery was placed through the right nostril and under direct vision electrocautery was used to control  bleeding in the nasopharyngeal area. There is no evidence of purulence anywhere. There is no further bleeding noted.  The patient was awakened and taken to the recovery room in satisfactory condition. There is no operative complications. She tolerated the procedure well  Dispo:   To PACU to be discharged home.  Plan:  To follow-up in the office in 1 week to reevaluate the nasopharynx and go over the path report with her. She can have a normal diet at home and will use her tramadol for pain as necessary.  Zale Marcotte H  01/05/2016 8:21 AM

## 2016-01-05 NOTE — Anesthesia Procedure Notes (Signed)
Procedure Name: Intubation Date/Time: 01/05/2016 7:30 AM Performed by: Johnna Acosta Pre-anesthesia Checklist: Patient identified, Emergency Drugs available, Suction available, Patient being monitored and Timeout performed Patient Re-evaluated:Patient Re-evaluated prior to inductionOxygen Delivery Method: Circle system utilized Preoxygenation: Pre-oxygenation with 100% oxygen Intubation Type: IV induction Ventilation: Mask ventilation without difficulty and Oral airway inserted - appropriate to patient size Laryngoscope Size: Sabra Heck and 2 Grade View: Grade I Tube type: Oral Rae Tube size: 7.0 mm Number of attempts: 1 Airway Equipment and Method: Stylet Placement Confirmation: ETT inserted through vocal cords under direct vision and breath sounds checked- equal and bilateral Tube secured with: Tape Dental Injury: Teeth and Oropharynx as per pre-operative assessment

## 2016-01-05 NOTE — Transfer of Care (Signed)
Immediate Anesthesia Transfer of Care Note  Patient: Alexandra Martinez  Procedure(s) Performed: Procedure(s): ADENOIDECTOMY (N/A) NASOPHARYNGOSCOPY  Patient Location: PACU  Anesthesia Type:General  Level of Consciousness: sedated  Airway & Oxygen Therapy: Patient Spontanous Breathing and Patient connected to face mask oxygen  Post-op Assessment: Report given to RN and Post -op Vital signs reviewed and stable  Post vital signs: Reviewed and stable  Last Vitals:  Vitals:   01/05/16 0637 01/05/16 0827  BP: (!) 168/78 (!) 156/88  Pulse: 83 76  Resp: 20 20  Temp: 36.4 C 36.1 C    Last Pain:  Vitals:   01/05/16 0827  TempSrc:   PainSc: Asleep         Complications: No apparent anesthesia complications

## 2016-01-05 NOTE — Anesthesia Preprocedure Evaluation (Signed)
Anesthesia Evaluation  Patient identified by MRN, date of birth, ID band Patient awake    Reviewed: Allergy & Precautions, H&P , NPO status , Patient's Chart, lab work & pertinent test results, reviewed documented beta blocker date and time   History of Anesthesia Complications Negative for: history of anesthetic complications  Airway Mallampati: II  TM Distance: >3 FB Neck ROM: full    Dental no notable dental hx. (+) Teeth Intact   Pulmonary shortness of breath and with exertion, neg sleep apnea, neg COPD, neg recent URI,    Pulmonary exam normal breath sounds clear to auscultation       Cardiovascular Exercise Tolerance: Good hypertension, (-) angina(-) CAD, (-) Past MI, (-) Cardiac Stents and (-) CABG Normal cardiovascular exam+ dysrhythmias Atrial Fibrillation + Valvular Problems/Murmurs  Rhythm:regular Rate:Normal     Neuro/Psych negative neurological ROS  negative psych ROS   GI/Hepatic Neg liver ROS, hiatal hernia, GERD  ,  Endo/Other  neg diabetesHypothyroidism   Renal/GU Renal disease  negative genitourinary   Musculoskeletal   Abdominal   Peds  Hematology  (+) Blood dyscrasia, anemia ,   Anesthesia Other Findings Past Medical History: No date: Anemia     Comment: r/t uterine fibroids No date: Atrial fib/flutter, transient     Comment: [place] [facility] admission for RVR 03/2009 No date: DDD (degenerative disc disease) No date: Dysrhythmia     Comment: A-FIB No date: GERD (gastroesophageal reflux disease) No date: H/O bladder infections No date: H/O hiatal hernia No date: H/O thyroid nodule     Comment: surgically removed No date: Heart murmur     Comment: moderate MR/TR 08/2013 echo [name]) No date: Hemorrhoids 2008: Hiatal hernia with gastroesophageal reflux     Comment: by EGD,  [name] No date: High cholesterol No date: History of uterine fibroid No date: Hydronephrosis     Comment: uretero- with  congenital UPJ obstruction- Dr.               Edrick Oh No date: Hydronephrosis with ureteropelvic junction obs* No date: Hypertension     Comment: [facility] No date: Hypothyroidism No date: Nocturia No date: Osteoporosis No date: Polyuria No date: Recurrent UTI   Reproductive/Obstetrics negative OB ROS                             Anesthesia Physical Anesthesia Plan  ASA: III  Anesthesia Plan: General   Post-op Pain Management:    Induction:   Airway Management Planned:   Additional Equipment:   Intra-op Plan:   Post-operative Plan:   Informed Consent: I have reviewed the patients History and Physical, chart, labs and discussed the procedure including the risks, benefits and alternatives for the proposed anesthesia with the patient or authorized representative who has indicated his/her understanding and acceptance.   Dental Advisory Given  Plan Discussed with: Anesthesiologist, CRNA and Surgeon  Anesthesia Plan Comments:         Anesthesia Quick Evaluation

## 2016-01-05 NOTE — H&P (Signed)
  H&P has been reviewed and no changes necessary. To be downloaded later. 

## 2016-01-05 NOTE — Discharge Instructions (Signed)

## 2016-01-06 LAB — SURGICAL PATHOLOGY

## 2016-01-06 NOTE — Anesthesia Postprocedure Evaluation (Signed)
Anesthesia Post Note  Patient: Alexandra Martinez  Procedure(s) Performed: Procedure(s) (LRB): ADENOIDECTOMY (N/A) NASOPHARYNGOSCOPY  Patient location during evaluation: PACU Anesthesia Type: General Level of consciousness: awake and alert Pain management: pain level controlled Vital Signs Assessment: post-procedure vital signs reviewed and stable Respiratory status: spontaneous breathing, nonlabored ventilation, respiratory function stable and patient connected to nasal cannula oxygen Cardiovascular status: blood pressure returned to baseline and stable Postop Assessment: no signs of nausea or vomiting Anesthetic complications: no    Last Vitals:  Vitals:   01/05/16 0936 01/05/16 1052  BP: (!) 140/98 (!) 174/86  Pulse: 83 88  Resp: 16 16  Temp: 36.6 C     Last Pain:  Vitals:   01/05/16 1052  TempSrc:   PainSc: 3                  Martha Clan

## 2016-01-21 DIAGNOSIS — J352 Hypertrophy of adenoids: Secondary | ICD-10-CM | POA: Diagnosis not present

## 2016-02-17 DIAGNOSIS — N183 Chronic kidney disease, stage 3 (moderate): Secondary | ICD-10-CM | POA: Diagnosis not present

## 2016-02-17 DIAGNOSIS — N2581 Secondary hyperparathyroidism of renal origin: Secondary | ICD-10-CM | POA: Diagnosis not present

## 2016-02-17 DIAGNOSIS — I10 Essential (primary) hypertension: Secondary | ICD-10-CM | POA: Diagnosis not present

## 2016-02-17 DIAGNOSIS — R809 Proteinuria, unspecified: Secondary | ICD-10-CM | POA: Diagnosis not present

## 2016-02-19 DIAGNOSIS — Z23 Encounter for immunization: Secondary | ICD-10-CM | POA: Diagnosis not present

## 2016-03-05 DIAGNOSIS — J352 Hypertrophy of adenoids: Secondary | ICD-10-CM | POA: Diagnosis not present

## 2016-03-05 DIAGNOSIS — J3502 Chronic adenoiditis: Secondary | ICD-10-CM | POA: Diagnosis not present

## 2016-03-08 DIAGNOSIS — Z6825 Body mass index (BMI) 25.0-25.9, adult: Secondary | ICD-10-CM | POA: Diagnosis not present

## 2016-03-08 DIAGNOSIS — Z6826 Body mass index (BMI) 26.0-26.9, adult: Secondary | ICD-10-CM | POA: Diagnosis not present

## 2016-03-08 DIAGNOSIS — I10 Essential (primary) hypertension: Secondary | ICD-10-CM | POA: Diagnosis not present

## 2016-03-15 ENCOUNTER — Other Ambulatory Visit: Payer: Self-pay | Admitting: Internal Medicine

## 2016-03-17 ENCOUNTER — Other Ambulatory Visit: Payer: Self-pay | Admitting: Neurosurgery

## 2016-03-17 DIAGNOSIS — M4316 Spondylolisthesis, lumbar region: Secondary | ICD-10-CM

## 2016-03-26 ENCOUNTER — Other Ambulatory Visit: Payer: Self-pay | Admitting: Internal Medicine

## 2016-04-07 DIAGNOSIS — Z48813 Encounter for surgical aftercare following surgery on the respiratory system: Secondary | ICD-10-CM | POA: Diagnosis not present

## 2016-04-08 ENCOUNTER — Ambulatory Visit
Admission: RE | Admit: 2016-04-08 | Discharge: 2016-04-08 | Disposition: A | Discharge status: Home / Self Care | Payer: Medicare Other | Source: Ambulatory Visit | Attending: Neurosurgery | Admitting: Neurosurgery

## 2016-04-08 VITALS — BP 136/61 | HR 71

## 2016-04-08 DIAGNOSIS — M4316 Spondylolisthesis, lumbar region: Secondary | ICD-10-CM

## 2016-04-08 DIAGNOSIS — M5126 Other intervertebral disc displacement, lumbar region: Secondary | ICD-10-CM | POA: Diagnosis not present

## 2016-04-08 DIAGNOSIS — Z981 Arthrodesis status: Secondary | ICD-10-CM

## 2016-04-08 DIAGNOSIS — M5136 Other intervertebral disc degeneration, lumbar region: Secondary | ICD-10-CM

## 2016-04-08 MED ORDER — IOPAMIDOL (ISOVUE-M 200) INJECTION 41%
15.0000 mL | Freq: Once | INTRAMUSCULAR | Status: AC
  Administered 2016-04-08: 15 mL via INTRATHECAL

## 2016-04-08 MED ORDER — DIAZEPAM 5 MG PO TABS
5.0000 mg | ORAL_TABLET | Freq: Once | ORAL | Status: AC
  Administered 2016-04-08: 2.5 mg via ORAL

## 2016-04-08 NOTE — Discharge Instructions (Signed)
Myelogram Discharge Instructions  1. Go home and rest quietly for the next 24 hours.  It is important to lie flat for the next 24 hours.  Get up only to go to the restroom.  You may lie in the bed or on a couch on your back, your stomach, your left side or your right side.  You may have one pillow under your head.  You may have pillows between your knees while you are on your side or under your knees while you are on your back.  2. DO NOT drive today.  Recline the seat as far back as it will go, while still wearing your seat belt, on the way home.  3. You may get up to go to the bathroom as needed.  You may sit up for 10 minutes to eat.  You may resume your normal diet and medications unless otherwise indicated.  Drink lots of extra fluids today and tomorrow.  4. The incidence of headache, nausea, or vomiting is about 5% (one in 20 patients).  If you develop a headache, lie flat and drink plenty of fluids until the headache goes away.  Caffeinated beverages may be helpful.  If you develop severe nausea and vomiting or a headache that does not go away with flat bed rest, call [phone].  5. You may resume normal activities after your 24 hours of bed rest is over; however, do not exert yourself strongly or do any heavy lifting tomorrow. If when you get up you have a headache when standing, go back to bed and force fluids for another 24 hours.  6. Call your physician for a follow-up appointment.  The results of your myelogram will be sent directly to your physician by the following day.  7. If you have any questions or if complications develop after you arrive home, please call [phone].  Discharge instructions have been explained to the patient.  The patient, or the person responsible for the patient, fully understands these instructions.        MAY RESUME [place].   May resume Tramadol on Dec. 22, 2017, after 1:00 pm.

## 2016-04-08 NOTE — Progress Notes (Signed)
Patient states she has been off Xarelto and Tramadol for at least the past two days.  jkl

## 2016-04-15 DIAGNOSIS — Z6826 Body mass index (BMI) 26.0-26.9, adult: Secondary | ICD-10-CM | POA: Diagnosis not present

## 2016-04-15 DIAGNOSIS — M545 Low back pain: Secondary | ICD-10-CM | POA: Diagnosis not present

## 2016-04-15 DIAGNOSIS — I10 Essential (primary) hypertension: Secondary | ICD-10-CM | POA: Diagnosis not present

## 2016-04-23 ENCOUNTER — Other Ambulatory Visit: Payer: Self-pay | Admitting: Neurosurgery

## 2016-04-23 DIAGNOSIS — G8929 Other chronic pain: Secondary | ICD-10-CM

## 2016-04-23 DIAGNOSIS — M545 Low back pain, unspecified: Secondary | ICD-10-CM

## 2016-04-29 ENCOUNTER — Ambulatory Visit
Admission: RE | Admit: 2016-04-29 | Discharge: 2016-04-29 | Disposition: A | Discharge status: Home / Self Care | Payer: Medicare Other | Source: Ambulatory Visit | Attending: Neurosurgery | Admitting: Neurosurgery

## 2016-04-29 DIAGNOSIS — M545 Low back pain, unspecified: Secondary | ICD-10-CM

## 2016-04-29 DIAGNOSIS — G8929 Other chronic pain: Secondary | ICD-10-CM

## 2016-04-29 MED ORDER — METHYLPREDNISOLONE ACETATE 40 MG/ML INJ SUSP (RADIOLOG
120.0000 mg | Freq: Once | INTRAMUSCULAR | Status: AC
  Administered 2016-04-29: 120 mg via EPIDURAL

## 2016-04-29 MED ORDER — IOPAMIDOL (ISOVUE-M 200) INJECTION 41%
1.0000 mL | Freq: Once | INTRAMUSCULAR | Status: AC
  Administered 2016-04-29: 1 mL via EPIDURAL

## 2016-04-29 NOTE — Discharge Instructions (Signed)
Post Procedure Spinal Discharge Instruction Sheet  1. You may resume a regular diet and any medications that you routinely take (including pain medications).  2. No driving day of procedure.  3. Light activity throughout the rest of the day.  Do not do any strenuous work, exercise, bending or lifting.  The day following the procedure, you can resume normal physical activity but you should refrain from exercising or physical therapy for at least three days thereafter.   Common Side Effects:   Headaches- take your usual medications as directed by your physician.  Increase your fluid intake.  Caffeinated beverages may be helpful.  Lie flat in bed until your headache resolves.   Restlessness or inability to sleep- you may have trouble sleeping for the next few days.  Ask your referring physician if you need any medication for sleep.   Facial flushing or redness- should subside within a few days.   Increased pain- a temporary increase in pain a day or two following your procedure is not unusual.  Take your pain medication as prescribed by your referring physician.   Leg cramps  Please contact our office at [phone] for the following symptoms:  Fever greater than 100 degrees.  Headaches unresolved with medication after 2-3 days.  Increased swelling, pain, or redness at injection site.  Thank you for visiting our office.    You may resume Xarelto today.

## 2016-05-03 ENCOUNTER — Other Ambulatory Visit: Payer: Self-pay | Admitting: Internal Medicine

## 2016-05-24 ENCOUNTER — Ambulatory Visit (INDEPENDENT_AMBULATORY_CARE_PROVIDER_SITE_OTHER): Payer: Medicare Other | Admitting: Family Medicine

## 2016-05-24 ENCOUNTER — Encounter: Payer: Self-pay | Admitting: Family Medicine

## 2016-05-24 VITALS — BP 152/90 | HR 59 | Temp 98.5°F | Wt 154.2 lb

## 2016-05-24 DIAGNOSIS — G8929 Other chronic pain: Secondary | ICD-10-CM

## 2016-05-24 DIAGNOSIS — J302 Other seasonal allergic rhinitis: Secondary | ICD-10-CM | POA: Diagnosis not present

## 2016-05-24 DIAGNOSIS — R5382 Chronic fatigue, unspecified: Secondary | ICD-10-CM

## 2016-05-24 DIAGNOSIS — R5383 Other fatigue: Secondary | ICD-10-CM

## 2016-05-24 DIAGNOSIS — M545 Low back pain: Secondary | ICD-10-CM

## 2016-05-24 NOTE — Progress Notes (Signed)
  Tommi Rumps, MD Phone: [phone]  Alexandra Martinez is a 83 y.o. female who presents today for same-day visit.  Patient notes drainage down her throat and rhinorrhea chronically. Improved somewhat after her adenoidectomy in September 2017. Notes minimal cough if any. Clear phlegm out of her nose. Patient additionally note she just feels tired and overall weak since she had her back operated on in 2015. She notes she is able to do things physically without difficulty though the next day she will be overall tired. No chest pain or shortness of breath. This has been going on for several years. Patient additionally notes chronic low back pain. This has been going on for some time as well. She is followed by neurosurgery for this. She had an injection several weeks ago. She takes tramadol if needed. Notes her legs feel heavy though no weakness. Occasionally notes left posterior leg numbness that is intermittent. Can't remember last time this occurred. She has chronic urinary incontinence has been going on for a number of years and is unchanged. She notes no bowel incontinence. No saddle anesthesia. No fevers.  PMH: nonsmoker.   ROS see history of present illness  Objective  Physical Exam Vitals:   05/24/16 1607 05/24/16 1630  BP: (!) 160/80 (!) 152/90  Pulse: (!) 59   Temp: 98.5 F (36.9 C)     BP Readings from Last 3 Encounters:  05/24/16 (!) 152/90  04/29/16 (!) 173/68  04/08/16 136/61   Wt Readings from Last 3 Encounters:  05/24/16 154 lb 3.2 oz (69.9 kg)  01/05/16 155 lb (70.3 kg)  08/04/15 158 lb 4 oz (71.8 kg)    Physical Exam  Constitutional: No distress.  HENT:  Head: Normocephalic and atraumatic.  Mouth/Throat: Oropharynx is clear and moist.  Normal TMs bilaterally  Eyes: Conjunctivae are normal. Pupils are equal, round, and reactive to light.  Neck: Neck supple.  Cardiovascular: Normal rate and normal heart sounds.   Pulmonary/Chest: Effort normal and breath  sounds normal.  Musculoskeletal:  No midline spine tenderness, no midline spine step-off, no muscular back tenderness  Lymphadenopathy:    She has no cervical adenopathy.  Neurological: She is alert. Gait normal.  5 out of 5 strength bilateral quads, hamstrings, plantar flexion, and dorsiflexion, sensation to light touch intact in bilateral lower extremities  Skin: Skin is warm and dry. She is not diaphoretic.     Assessment/Plan: Please see individual problem list.  Seasonal rhinitis Symptoms most consistent with allergic rhinitis. Discussed starting Flonase and Claritin.  Chronic lower back pain Continues to have chronic low back pain. Reports some intermittent numbness left posterior leg. Neurologically intact in her lower extremities. Has stable chronic urge incontinence that is unchanged from previously. Doubt this is related to her back. Discussed continuing to follow-up with her neurosurgeon regarding her back pain.  Fatigue Chronic issue since she had her back operated on. We'll check CBC and TSH and CMP. She'll follow-up with her PCP regarding this.   Orders Placed This Encounter  Procedures  . Comp Met (CMET)  . CBC  . TSH    Tommi Rumps, MD [place]

## 2016-05-24 NOTE — Patient Instructions (Signed)
Nice to see you. We'll check some lab work today. You can use Claritin and Flonase for your drainage. Please continue to follow with your surgeon regarding your back.

## 2016-05-24 NOTE — Assessment & Plan Note (Addendum)
Continues to have chronic low back pain. Reports some intermittent numbness left posterior leg. Neurologically intact in her lower extremities. Has stable chronic urge incontinence that is unchanged from previously. Doubt this is related to her back. Discussed continuing to follow-up with her neurosurgeon regarding her back pain.

## 2016-05-24 NOTE — Assessment & Plan Note (Signed)
Chronic issue since she had her back operated on. We'll check CBC and TSH and CMP. She'll follow-up with her PCP regarding this.

## 2016-05-24 NOTE — Assessment & Plan Note (Signed)
Symptoms most consistent with allergic rhinitis. Discussed starting Flonase and Claritin.

## 2016-05-24 NOTE — Progress Notes (Signed)
Pre visit review using our clinic review tool, if applicable. No additional management support is needed unless otherwise documented below in the visit note. 

## 2016-05-25 LAB — CBC
HCT: 38 % (ref 36.0–46.0)
Hemoglobin: 12.4 g/dL (ref 12.0–15.0)
MCHC: 32.7 g/dL (ref 30.0–36.0)
MCV: 92.4 fl (ref 78.0–100.0)
Platelets: 155 10*3/uL (ref 150.0–400.0)
RBC: 4.11 Mil/uL (ref 3.87–5.11)
RDW: 13.5 % (ref 11.5–15.5)
WBC: 7.1 10*3/uL (ref 4.0–10.5)

## 2016-05-25 LAB — COMPREHENSIVE METABOLIC PANEL
ALT: 10 U/L (ref 0–35)
AST: 15 U/L (ref 0–37)
Albumin: 3.9 g/dL (ref 3.5–5.2)
Alkaline Phosphatase: 71 U/L (ref 39–117)
BUN: 18 mg/dL (ref 6–23)
CO2: 30 mEq/L (ref 19–32)
Calcium: 9.1 mg/dL (ref 8.4–10.5)
Chloride: 106 mEq/L (ref 96–112)
Creatinine, Ser: 1.02 mg/dL (ref 0.40–1.20)
GFR: 54.86 mL/min — ABNORMAL LOW (ref >60.00)
Glucose, Bld: 98 mg/dL (ref 70–99)
Potassium: 4.6 mEq/L (ref 3.5–5.1)
Sodium: 139 mEq/L (ref 135–145)
Total Bilirubin: 0.4 mg/dL (ref 0.2–1.2)
Total Protein: 6.8 g/dL (ref 6.0–8.3)

## 2016-05-25 LAB — TSH: TSH: 2.77 u[IU]/mL (ref 0.35–4.50)

## 2016-05-31 ENCOUNTER — Other Ambulatory Visit: Payer: Self-pay | Admitting: Internal Medicine

## 2016-06-11 DIAGNOSIS — M546 Pain in thoracic spine: Secondary | ICD-10-CM | POA: Diagnosis not present

## 2016-06-11 DIAGNOSIS — M545 Low back pain: Secondary | ICD-10-CM | POA: Diagnosis not present

## 2016-06-11 DIAGNOSIS — Z981 Arthrodesis status: Secondary | ICD-10-CM | POA: Diagnosis not present

## 2016-06-11 DIAGNOSIS — M47816 Spondylosis without myelopathy or radiculopathy, lumbar region: Secondary | ICD-10-CM | POA: Diagnosis not present

## 2016-06-11 DIAGNOSIS — M5136 Other intervertebral disc degeneration, lumbar region: Secondary | ICD-10-CM | POA: Diagnosis not present

## 2016-06-16 DIAGNOSIS — R600 Localized edema: Secondary | ICD-10-CM | POA: Diagnosis not present

## 2016-06-16 DIAGNOSIS — N183 Chronic kidney disease, stage 3 (moderate): Secondary | ICD-10-CM | POA: Diagnosis not present

## 2016-06-16 DIAGNOSIS — I10 Essential (primary) hypertension: Secondary | ICD-10-CM | POA: Diagnosis not present

## 2016-06-16 DIAGNOSIS — N2581 Secondary hyperparathyroidism of renal origin: Secondary | ICD-10-CM | POA: Diagnosis not present

## 2016-06-16 DIAGNOSIS — R601 Generalized edema: Secondary | ICD-10-CM | POA: Diagnosis not present

## 2016-06-26 ENCOUNTER — Other Ambulatory Visit: Payer: Self-pay | Admitting: Internal Medicine

## 2016-06-30 ENCOUNTER — Ambulatory Visit: Payer: Medicare Other

## 2016-07-06 DIAGNOSIS — I482 Chronic atrial fibrillation: Secondary | ICD-10-CM | POA: Diagnosis not present

## 2016-07-06 DIAGNOSIS — I420 Dilated cardiomyopathy: Secondary | ICD-10-CM | POA: Diagnosis not present

## 2016-07-06 DIAGNOSIS — E782 Mixed hyperlipidemia: Secondary | ICD-10-CM | POA: Diagnosis not present

## 2016-07-09 DIAGNOSIS — I482 Chronic atrial fibrillation: Secondary | ICD-10-CM | POA: Diagnosis not present

## 2016-07-23 ENCOUNTER — Encounter: Payer: Self-pay | Admitting: Internal Medicine

## 2016-07-23 ENCOUNTER — Ambulatory Visit (INDEPENDENT_AMBULATORY_CARE_PROVIDER_SITE_OTHER): Payer: Medicare Other | Admitting: Internal Medicine

## 2016-07-23 ENCOUNTER — Other Ambulatory Visit: Payer: Self-pay | Admitting: Internal Medicine

## 2016-07-23 DIAGNOSIS — E039 Hypothyroidism, unspecified: Secondary | ICD-10-CM

## 2016-07-23 DIAGNOSIS — R5383 Other fatigue: Secondary | ICD-10-CM

## 2016-07-23 DIAGNOSIS — K589 Irritable bowel syndrome without diarrhea: Secondary | ICD-10-CM | POA: Diagnosis not present

## 2016-07-23 MED ORDER — TRAMADOL HCL 50 MG PO TABS: 50.0000 mg | ORAL_TABLET | Freq: Three times a day (TID) | ORAL | 5 refills | Status: DC | PRN

## 2016-07-23 NOTE — Progress Notes (Signed)
Subjective:  [name], female    DOB: 16-Jul-1929  Age: 83 y.o. MRN: [id number]  CC: Diagnoses of Other fatigue, Acquired hypothyroidism, and Irritable bowel syndrome without diarrhea were pertinent to this visit.  HPI Alexandra Martinez presents for FOLLOW UP ON HYPERTENSION,  HYPOTHYTROID  ATRIAL fibrillation and chronic anticoagulation with WITH XARELTO.  Last seen by me a year ago.   FATIGUE persistent, WORKUP Negative  FEB 2018   s/p lumbar spine surgery ; still has back pain that is .limiting her activities.  Not taking anything but tylenol daily even thought she has an rx for and tolerates tramadol.   Has about  15 minutes of housework before she has to sit down . Has gained weight due to inactivity.    Had adnoidectomy done in sept 2017 by juengel for chronic nasal drainage ,  Drainage improved somewhat,  Still has allergies     Outpatient Medications Prior to Visit  Medication Sig Dispense Refill  . acetaminophen (TYLENOL) 500 MG tablet Take 500 mg by mouth every 6 (six) hours as needed for headache.    . Esomeprazole Magnesium (NEXIUM PO) Take 20 mg by mouth as needed.     Marland Kitchen levothyroxine (SYNTHROID, LEVOTHROID) 50 MCG tablet TAKE 1 TABLET BY MOUTH EVERY DAY 30 tablet 2  . losartan (COZAAR) 25 MG tablet Take 25 mg by mouth every morning.    . metoprolol (LOPRESSOR) 100 MG tablet TAKE 1 TABLET BY MOUTH TWICE DAILY 180 tablet 2  . ondansetron (ZOFRAN) 4 MG tablet TAKE 1 TABLET BY MOUTH EVERY 8 HOURS AS NEEDED FOR NAUSEA OR VOMITING 30 tablet 3  . polyethylene glycol powder (GLYCOLAX/MIRALAX) powder Take 17 g by mouth daily as needed for mild constipation. 3350 g 1  . pravastatin (PRAVACHOL) 20 MG tablet TAKE 1 TABLET(20 MG) BY MOUTH EVERY NIGHT 30 tablet 0  . Rivaroxaban (XARELTO) 15 MG TABS tablet Take 15 mg by mouth daily with supper.    . Simethicone (EQ GAS RELIEF) 125 MG CAPS Take 125 mg by mouth daily as needed (gas). Reported on 07/01/2015    . traMADol  (ULTRAM) 50 MG tablet TAKE 1 TABLET BY MOUTH EVERY 8 HOURS AS NEEDED 90 tablet 2   No facility-administered medications prior to visit.     Review of Systems;  Patient denies headache, fevers, malaise, unintentional weight loss, skin rash, eye pain, sinus congestion and sinus pain, sore throat, dysphagia,  hemoptysis , cough, dyspnea, wheezing, chest pain, palpitations, orthopnea, edema, abdominal pain, nausea, melena, diarrhea, constipation, flank pain, dysuria, hematuria, urinary  Frequency, nocturia, numbness, tingling, seizures,  Focal weakness, Loss of consciousness,  Tremor, insomnia, depression, anxiety, and suicidal ideation.      Objective:  BP (!) 142/84   Pulse 70   Temp 98.2 F (36.8 C) (Oral)   Resp 16   Ht 5\' 4"  (1.626 m)   Wt 161 lb 12.8 oz (73.4 kg)   SpO2 95%   BMI 27.77 kg/m   BP Readings from Last 3 Encounters:  07/23/16 (!) 142/84  05/24/16 (!) 152/90  04/29/16 (!) 173/68    Wt Readings from Last 3 Encounters:  07/23/16 161 lb 12.8 oz (73.4 kg)  05/24/16 154 lb 3.2 oz (69.9 kg)  01/05/16 155 lb (70.3 kg)    General appearance: alert, cooperative and appears stated age Ears: normal TM's and external ear canals both ears Throat: lips, mucosa, and tongue normal; teeth and gums normal Neck: no adenopathy, no carotid  bruit, supple, symmetrical, trachea midline and thyroid not enlarged, symmetric, no tenderness/mass/nodules Back: symmetric, no curvature. ROM normal. No CVA tenderness. Lungs: clear to auscultation bilaterally Heart: regular rate and rhythm, S1, S2 normal, no murmur, click, rub or gallop Abdomen: soft, non-tender; bowel sounds normal; no masses,  no organomegaly Pulses: 2+ and symmetric Skin: Skin color, texture, turgor normal. No rashes or lesions Lymph nodes: Cervical, supraclavicular, and axillary nodes normal.  No results found for: HGBA1C  Lab Results  Component Value Date   CREATININE 1.02 05/24/2016   CREATININE 1.04 (H)  12/30/2015   CREATININE 1.29 (H) 08/13/2015    Lab Results  Component Value Date   WBC 7.1 05/24/2016   HGB 12.4 05/24/2016   HCT 38.0 05/24/2016   PLT 155.0 05/24/2016   GLUCOSE 98 05/24/2016   CHOL 217 (H) 07/16/2013   TRIG 88.0 07/16/2013   HDL 41.70 07/16/2013   LDLCALC 158 (H) 07/16/2013   ALT 10 05/24/2016   AST 15 05/24/2016   NA 139 05/24/2016   K 4.6 05/24/2016   CL 106 05/24/2016   CREATININE 1.02 05/24/2016   BUN 18 05/24/2016   CO2 30 05/24/2016   TSH 2.77 05/24/2016   INR 1.6 (H) 08/04/2015    Dg Inject Diag/thera/inc Needle/cath/plc Epi/lumb/sac W/img  Result Date: 04/29/2016 CLINICAL DATA:  Previous lumbar fusion. Persistent low back pain and bilateral leg pain. FLUOROSCOPY TIME:  0 minutes 15 seconds. 31.15 micro gray meter squared EXAM: CAUDAL EPIDURAL INJECTION Utilizing a caudal approach, the skin overlying the sacral hiatus was cleansed and anesthetized. A 22 gauge epidural needle was advanced into the sacral epidural space. Injection of Isovue-M 200 shows a good epidural pattern with spread up to L5-S1. No vascular opacification is seen. 120 mg of Depo-Medrol mixed with 3 ml of normal saline and 3 ml of 1% Lidocaine were instilled. The procedure was well-tolerated, and the patient was discharged thirty minutes following the injection in good condition. IMPRESSION: Technically successful caudal epidural injection #1. Electronically Signed   By: Nelson Chimes M.D.   On: 04/29/2016 11:17    Assessment & Plan:   Problem List Items Addressed This Visit    Fatigue    Chronic issue since she her back surgery .  No cause found during workup in February,  Etiology appears to be deconditioning due to inactivity       Hypothyroid    Thyroid function is normal.  No changes .  Lab Results  Component Value Date   TSH 2.77 05/24/2016           Irritable bowel syndrome without diarrhea    symptoms are currently quiescent          I have changed Ms.  Gerding's traMADol. I am also having her maintain her Simethicone, acetaminophen, Esomeprazole Magnesium (NEXIUM PO), polyethylene glycol powder, ondansetron, metoprolol, losartan, Rivaroxaban, levothyroxine, pravastatin, furosemide, and losartan.  Meds ordered this encounter  Medications  . furosemide (LASIX) 20 MG tablet    Sig: Take by mouth.  . losartan (COZAAR) 50 MG tablet  . traMADol (ULTRAM) 50 MG tablet    Sig: Take 1 tablet (50 mg total) by mouth every 8 (eight) hours as needed.    Dispense:  90 tablet    Refill:  5    Medications Discontinued During This Encounter  Medication Reason  . traMADol (ULTRAM) 50 MG tablet Reorder    Follow-up: Return in about 6 months (around 01/22/2017).   Crecencio Mc, MD

## 2016-07-23 NOTE — Patient Instructions (Addendum)
I recommend that you treat your back pain more aggressively  Because it's causing you to get out of shape!  use the tramadol on a daily basis,   Up to 3 daily  And schedule  500 mg tylenol twice daily  YOU WILL NOT DEVELOP AN ADDICITION IF YOU FOLLOW THIS REGIMEN!!    .There are several previously prescribed medications for seasonal allergies that are now available OTC.  You can try  allegra (fexofenadine 180 mg daily), zyrtec (cetirizine 10 mg daily), or  claritin (loratadine 10 mg daily) .  If those fail, the next step is a steroid nasal spray which I can prescribe.     You should try [facility] Sinus rinse after being outside in the pollen  ;  It is a strong sinus "flush" using water and medicated salts.  Do it over the sink because it can be a bit messy

## 2016-07-23 NOTE — Progress Notes (Signed)
Pre visit review using our clinic review tool, if applicable. No additional management support is needed unless otherwise documented below in the visit note. 

## 2016-07-23 NOTE — Telephone Encounter (Signed)
Refilled: 10/29/2015 Last OV: 08/04/2015 Next OV: 01/27/2017 Last Labs: 05/24/2016

## 2016-07-25 NOTE — Assessment & Plan Note (Signed)
symptoms are currently quiescent

## 2016-07-25 NOTE — Telephone Encounter (Signed)
Refill sent.

## 2016-07-25 NOTE — Assessment & Plan Note (Signed)
She continues to have chronic low back pain despite lumbar decompressive surgery that is limiting her activity  .  Suggested that she use tramadol and tylenol on a daily basis so she can tolerate more activity

## 2016-07-25 NOTE — Assessment & Plan Note (Signed)
Chronic issue since she her back surgery .  No cause found during workup in February,  Etiology appears to be deconditioning due to inactivity

## 2016-07-25 NOTE — Assessment & Plan Note (Signed)
Thyroid function is normal.  No changes .  Lab Results  Component Value Date   TSH 2.77 05/24/2016

## 2016-07-26 ENCOUNTER — Other Ambulatory Visit: Payer: Self-pay | Admitting: Internal Medicine

## 2016-08-02 DIAGNOSIS — E782 Mixed hyperlipidemia: Secondary | ICD-10-CM | POA: Diagnosis not present

## 2016-08-02 DIAGNOSIS — I420 Dilated cardiomyopathy: Secondary | ICD-10-CM | POA: Diagnosis not present

## 2016-08-02 DIAGNOSIS — I482 Chronic atrial fibrillation: Secondary | ICD-10-CM | POA: Diagnosis not present

## 2016-08-02 DIAGNOSIS — I38 Endocarditis, valve unspecified: Secondary | ICD-10-CM | POA: Diagnosis not present

## 2016-08-11 ENCOUNTER — Other Ambulatory Visit: Payer: Self-pay | Admitting: Internal Medicine

## 2016-08-13 ENCOUNTER — Other Ambulatory Visit: Payer: Self-pay | Admitting: Internal Medicine

## 2016-08-13 NOTE — Telephone Encounter (Signed)
Just refilled on 07/23/2016 with 5 refills

## 2016-08-20 ENCOUNTER — Other Ambulatory Visit: Payer: Self-pay | Admitting: Internal Medicine

## 2016-08-23 ENCOUNTER — Telehealth: Payer: Self-pay | Admitting: *Deleted

## 2016-08-23 MED ORDER — TRAMADOL HCL 50 MG PO TABS
50.0000 mg | ORAL_TABLET | Freq: Three times a day (TID) | ORAL | 5 refills | Status: DC | PRN
Start: 2016-08-23 — End: 2017-08-16

## 2016-08-23 NOTE — Telephone Encounter (Signed)
Per Dr. Derrel Nip the rx was reprinted and put in the quick sign folder.

## 2016-08-23 NOTE — Telephone Encounter (Signed)
Signed and faxed

## 2016-08-23 NOTE — Telephone Encounter (Signed)
Medication Refill requested for :tramadol  Pharmacy:[facility]  Return Contact : [facility]  [phone]

## 2016-09-05 ENCOUNTER — Emergency Department: Payer: Medicare Other

## 2016-09-05 ENCOUNTER — Encounter: Payer: Self-pay | Admitting: Emergency Medicine

## 2016-09-05 ENCOUNTER — Emergency Department
Admission: EM | Admit: 2016-09-05 | Discharge: 2016-09-05 | Disposition: A | Discharge status: Home / Self Care | Payer: Medicare Other | Attending: Emergency Medicine | Admitting: Emergency Medicine

## 2016-09-05 DIAGNOSIS — Z79899 Other long term (current) drug therapy: Secondary | ICD-10-CM | POA: Insufficient documentation

## 2016-09-05 DIAGNOSIS — Z7902 Long term (current) use of antithrombotics/antiplatelets: Secondary | ICD-10-CM | POA: Diagnosis not present

## 2016-09-05 DIAGNOSIS — I10 Essential (primary) hypertension: Secondary | ICD-10-CM | POA: Diagnosis not present

## 2016-09-05 DIAGNOSIS — R5383 Other fatigue: Secondary | ICD-10-CM | POA: Diagnosis not present

## 2016-09-05 DIAGNOSIS — R531 Weakness: Secondary | ICD-10-CM | POA: Diagnosis not present

## 2016-09-05 DIAGNOSIS — E039 Hypothyroidism, unspecified: Secondary | ICD-10-CM | POA: Insufficient documentation

## 2016-09-05 DIAGNOSIS — R1084 Generalized abdominal pain: Secondary | ICD-10-CM | POA: Diagnosis not present

## 2016-09-05 LAB — BASIC METABOLIC PANEL
Anion gap: 3 — ABNORMAL LOW (ref 5–15)
BUN: 17 mg/dL (ref 6–20)
CO2: 28 mmol/L (ref 22–32)
Calcium: 8.7 mg/dL — ABNORMAL LOW (ref 8.9–10.3)
Chloride: 106 mmol/L (ref 101–111)
Creatinine, Ser: 1.06 mg/dL — ABNORMAL HIGH (ref 0.44–1.00)
GFR calc Af Amer: 54 mL/min — ABNORMAL LOW (ref >60)
GFR calc non Af Amer: 47 mL/min — ABNORMAL LOW (ref >60)
Glucose, Bld: 100 mg/dL — ABNORMAL HIGH (ref 65–99)
Potassium: 4.3 mmol/L (ref 3.5–5.1)
Sodium: 137 mmol/L (ref 135–145)

## 2016-09-05 LAB — LIPASE, BLOOD: Lipase: 19 U/L (ref 11–51)

## 2016-09-05 LAB — URINALYSIS, COMPLETE (UACMP) WITH MICROSCOPIC
Bilirubin Urine: NEGATIVE
Glucose, UA: NEGATIVE mg/dL
Ketones, ur: NEGATIVE mg/dL
Nitrite: NEGATIVE
Protein, ur: NEGATIVE mg/dL
Specific Gravity, Urine: 1.004 — ABNORMAL LOW (ref 1.005–1.030)
Squamous Epithelial / LPF: NONE SEEN
pH: 6 (ref 5.0–8.0)

## 2016-09-05 LAB — CBC
HCT: 35.7 % (ref 35.0–47.0)
Hemoglobin: 12.1 g/dL (ref 12.0–16.0)
MCH: 30.9 pg (ref 26.0–34.0)
MCHC: 34 g/dL (ref 32.0–36.0)
MCV: 90.9 fL (ref 80.0–100.0)
Platelets: 138 10*3/uL — ABNORMAL LOW (ref 150–440)
RBC: 3.93 MIL/uL (ref 3.80–5.20)
RDW: 12.5 % (ref 11.5–14.5)
WBC: 5.7 10*3/uL (ref 3.6–11.0)

## 2016-09-05 LAB — MAGNESIUM: Magnesium: 1.9 mg/dL (ref 1.7–2.4)

## 2016-09-05 LAB — T4, FREE: Free T4: 1.09 ng/dL (ref 0.61–1.12)

## 2016-09-05 LAB — HEPATIC FUNCTION PANEL
ALT: 12 U/L — ABNORMAL LOW (ref 14–54)
AST: 22 U/L (ref 15–41)
Albumin: 3.7 g/dL (ref 3.5–5.0)
Alkaline Phosphatase: 64 U/L (ref 38–126)
Bilirubin, Direct: 0.1 mg/dL (ref 0.1–0.5)
Indirect Bilirubin: 0.5 mg/dL (ref 0.3–0.9)
Total Bilirubin: 0.6 mg/dL (ref 0.3–1.2)
Total Protein: 6.5 g/dL (ref 6.5–8.1)

## 2016-09-05 LAB — PHOSPHORUS: Phosphorus: 4.3 mg/dL (ref 2.5–4.6)

## 2016-09-05 LAB — TSH: TSH: 2.093 u[IU]/mL (ref 0.350–4.500)

## 2016-09-05 MED ORDER — IOPAMIDOL (ISOVUE-300) INJECTION 61%
80.0000 mL | Freq: Once | INTRAVENOUS | Status: AC | PRN
  Administered 2016-09-05: 80 mL via INTRAVENOUS

## 2016-09-05 MED ORDER — IOPAMIDOL (ISOVUE-300) INJECTION 61%
30.0000 mL | Freq: Once | INTRAVENOUS | Status: AC | PRN
  Administered 2016-09-05: 30 mL via ORAL

## 2016-09-05 NOTE — Discharge Instructions (Signed)
Your CT scan, chest xray, and lab tests today did not show any acute issues.  Continue your medications and follow up with your primary care doctor tomorrow. Continue taking your medications as prescribed. Avoid taking tramadol until you see your doctor to see if this helps with your symptoms.

## 2016-09-05 NOTE — ED Provider Notes (Signed)
[facility] Emergency Department Provider Note  ____________________________________________  Time seen: Approximately 3:26 PM  I have reviewed the triage vital signs and the nursing notes.   HISTORY  Chief Complaint Weakness Level 5 caveat:  Portions of the history and physical were unable to be obtained due to the patient's poor historian    HPI Alexandra Martinez is a 83 y.o. female who complains of chronic low back pain and fatigue since her back surgery in September 2015. She notes that after that she had a cane and a walker which she uses for a time but now she walks independently. She reports that over the last 2-3 days, she has felt more tired than usual. She denies any pain and does not report any other symptoms. She wonders if it's related to the tramadol that she takes for her back pain occasionally. Denies falls or trauma. Endorses occasional dizziness on standing. Denies chest pain shortness of breath. Denies fever chills or vomiting. She reports eating and drinking normally.She endorses vague generalized abdominal pain which she cannot further characterize.  Fatigue has been constant for the past few days. No aggravating or alleviating factors. Reports it is moderate in severity.     Past Medical History:  Diagnosis Date  . Anemia    r/t uterine fibroids  . Atrial fib/flutter, transient    [facility] admission for RVR 03/2009  . DDD (degenerative disc disease)   . Dysrhythmia    A-FIB  . GERD (gastroesophageal reflux disease)   . H/O bladder infections   . H/O hiatal hernia   . H/O thyroid nodule    surgically removed  . Heart murmur    moderate MR/TR 08/2013 echo [name])  . Hemorrhoids   . Hiatal hernia with gastroesophageal reflux 2008   by EGD,  [name]  . High cholesterol   . History of uterine fibroid   . Hydronephrosis    uretero- with congenital UPJ obstruction- Dr. Edrick Oh  . Hydronephrosis with ureteropelvic junction obstruction    . Hypertension    [facility]  . Hypothyroidism   . Nocturia   . Osteoporosis   . Polyuria   . Recurrent UTI      Patient Active Problem List   Diagnosis Date Noted  . Right hip pain 08/05/2015  . Anemia 08/05/2015  . Vitamin D deficiency 08/05/2015  . Hemoptysis 08/04/2015  . Constipation 02/04/2015  . Left knee pain 01/05/2015  . Status post laminectomy with spinal fusion 02/23/2014  . Fatigue 07/07/2013  . Dyspnea on exertion 08/12/2012  . Chronic lower back pain 08/12/2012  . Urge urinary incontinence 08/12/2012  . Personal history of colonic polyps 08/12/2012  . Seasonal rhinitis 01/16/2012  . Hiatal hernia with gastroesophageal reflux   . Fatigue 09/16/2011  . Controlled atrial fibrillation ([place]) 12/15/2010  . Irritable bowel syndrome without diarrhea 12/15/2010  . Hypothyroid 12/15/2010  . Long term current use of anticoagulant therapy 12/15/2010     Past Surgical History:  Procedure Laterality Date  . ADENOIDECTOMY N/A 01/05/2016   Procedure: ADENOIDECTOMY;  Surgeon: Margaretha Sheffield, MD;  Location: [facility] ORS;  Service: ENT;  Laterality: N/A;  . BACK SURGERY    . CATARACT EXTRACTION     left  . CERVICAL DISC SURGERY  1980's  . CHOLECYSTECTOMY    . NASOPHARYNGOSCOPY  01/05/2016   Procedure: NASOPHARYNGOSCOPY;  Surgeon: Margaretha Sheffield, MD;  Location: [facility] ORS;  Service: ENT;;  . THYROID LOBECTOMY     partial, right lobe  .  TOTAL ABDOMINAL HYSTERECTOMY W/ BILATERAL SALPINGOOPHORECTOMY       Prior to Admission medications   Medication Sig Start Date End Date Taking? Authorizing Provider  acetaminophen (TYLENOL) 500 MG tablet Take 500 mg by mouth every 6 (six) hours as needed for headache.    [provider]  Esomeprazole Magnesium (NEXIUM PO) Take 20 mg by mouth as needed.     [provider]  furosemide (LASIX) 20 MG tablet Take by mouth. 06/16/16   [provider]  levothyroxine (SYNTHROID, LEVOTHROID) 50 MCG tablet TAKE 1  TABLET BY MOUTH EVERY DAY 08/12/16   Crecencio Mc, MD  losartan (COZAAR) 25 MG tablet Take 25 mg by mouth every morning.    [provider]  losartan (COZAAR) 50 MG tablet  04/26/16   [provider]  metoprolol (LOPRESSOR) 100 MG tablet TAKE 1 TABLET BY MOUTH TWICE DAILY 07/25/16   Crecencio Mc, MD  ondansetron (ZOFRAN) 4 MG tablet TAKE 1 TABLET BY MOUTH EVERY 8 HOURS AS NEEDED FOR NAUSEA OR VOMITING 08/18/15   Crecencio Mc, MD  polyethylene glycol powder (GLYCOLAX/MIRALAX) powder Take 17 g by mouth daily as needed for mild constipation. 01/02/15   Leone Haven, MD  pravastatin (PRAVACHOL) 20 MG tablet TAKE 1 TABLET(20 MG) BY MOUTH EVERY NIGHT 07/26/16   Crecencio Mc, MD  Rivaroxaban (XARELTO) 15 MG TABS tablet Take 15 mg by mouth daily with supper.    [provider]  Simethicone (EQ GAS RELIEF) 125 MG CAPS Take 125 mg by mouth daily as needed (gas). Reported on 07/01/2015    [provider]  traMADol (ULTRAM) 50 MG tablet Take 1 tablet (50 mg total) by mouth every 8 (eight) hours as needed. 08/23/16   Crecencio Mc, MD     Allergies Celebrex [celecoxib]; Venlafaxine; Vioxx [rofecoxib]; Codeine; Dexilant [dexlansoprazole]; and Eliquis [apixaban]   Family History  Problem Relation Age of Onset  . Heart disease Mother   . Coronary artery disease Father        MI's    Social History Social History  Substance Use Topics  . Smoking status: Never Smoker  . Smokeless tobacco: Never Used  . Alcohol use No    Review of Systems  Constitutional:   No fever or chills.  ENT:   No sore throat. No rhinorrhea. Cardiovascular:   No chest pain or syncope. Respiratory:   No dyspnea or cough. Gastrointestinal:   Negative for abdominal pain, vomiting and diarrhea.  Genitourinary: No dysuria frequency or urgency Musculoskeletal:   Chronic low back pain All other systems reviewed and are negative except as documented above in ROS and  HPI.  ____________________________________________   PHYSICAL EXAM:  VITAL SIGNS: ED Triage Vitals  Enc Vitals Group     BP 09/05/16 1107 (!) 144/81     Pulse Rate 09/05/16 1107 68     Resp 09/05/16 1107 18     Temp 09/05/16 1107 98 F (36.7 C)     Temp Source 09/05/16 1107 Oral     SpO2 09/05/16 1107 97 %     Weight 09/05/16 1107 150 lb (68 kg)     Height 09/05/16 1107 5\' 4"  (1.626 m)     Head Circumference --      Peak Flow --      Pain Score 09/05/16 1118 0     Pain Loc --      Pain Edu? --      Excl. in [place]? --  Vital signs reviewed, nursing assessments reviewed.   Constitutional:   Alert and oriented. Well appearing and in no distress. Eyes:   No scleral icterus. No conjunctival pallor. PERRL. EOMI.  No nystagmus. ENT   Head:   Normocephalic and atraumatic.   Nose:   No congestion/rhinnorhea.    Mouth/Throat:   MMM, no pharyngeal erythema. No peritonsillar mass.    Neck:   No meningismus. Full ROM Hematological/Lymphatic/Immunilogical:   No cervical lymphadenopathy. Cardiovascular:   Irregularly irregular rhythm, rate about 70. Symmetric bilateral radial and DP pulses.  No murmurs.  Respiratory:   Normal respiratory effort without tachypnea/retractions. Breath sounds are clear and equal bilaterally. No wheezes/rales/rhonchi. Gastrointestinal:   Soft with generalized mild tenderness. Non distended. There is no CVA tenderness.  No rebound, rigidity, or guarding. Genitourinary:   deferred Musculoskeletal:   Normal range of motion in all extremities. No joint effusions.  No lower extremity tenderness.  No edema. Symmetric Circumference. No midline spinal tenderness. No lower back muscular tenderness Neurologic:   Normal speech and language.  CN 2-10 normal. Motor grossly intact and large muscle groups of all extremities. No pronator drift. Normal gait. No gross focal neurologic deficits are appreciated.  Skin:    Skin is warm, dry and intact. No rash  noted.  No petechiae, purpura, or bullae.  ____________________________________________    LABS (pertinent positives/negatives) (all labs ordered are listed, but only abnormal results are displayed) Labs Reviewed  BASIC METABOLIC PANEL - Abnormal; Notable for the following:       Result Value   Glucose, Bld 100 (*)    Creatinine, Ser 1.06 (*)    Calcium 8.7 (*)    GFR calc non Af Amer 47 (*)    GFR calc Af Amer 54 (*)    Anion gap 3 (*)    All other components within normal limits  CBC - Abnormal; Notable for the following:    Platelets 138 (*)    All other components within normal limits  URINALYSIS, COMPLETE (UACMP) WITH MICROSCOPIC - Abnormal; Notable for the following:    Color, Urine STRAW (*)    APPearance CLEAR (*)    Specific Gravity, Urine 1.004 (*)    Hgb urine dipstick SMALL (*)    Leukocytes, UA TRACE (*)    Bacteria, UA RARE (*)    All other components within normal limits  HEPATIC FUNCTION PANEL - Abnormal; Notable for the following:    ALT 12 (*)    All other components within normal limits  LIPASE, BLOOD  MAGNESIUM  PHOSPHORUS  TSH  T4, FREE  CBG MONITORING, ED   ____________________________________________   EKG  Interpreted by me Atrial fibrillation rate of 71, normal axis intervals QRS ST segments and T waves  ____________________________________________    RADIOLOGY  Dg Chest 2 View  Result Date: 09/05/2016 CLINICAL DATA:  Generalized weakness, abdominal tenderness EXAM: CHEST  2 VIEW COMPARISON:  01/03/2014 FINDINGS: Lungs are clear.  No pleural effusion or pneumothorax. Cardiomegaly. Lumbar spine fixation hardware, incompletely visualized. IMPRESSION: No evidence of acute cardiopulmonary disease. Electronically Signed   By: Julian Hy M.D.   On: 09/05/2016 15:48   Ct Abdomen Pelvis W Contrast  Result Date: 09/05/2016 CLINICAL DATA:  Generalized abdominal pain weakness for 2 days. EXAM: CT ABDOMEN AND PELVIS WITH CONTRAST TECHNIQUE:  Multidetector CT imaging of the abdomen and pelvis was performed using the standard protocol following bolus administration of intravenous contrast. CONTRAST:  33mL ISOVUE-300 IOPAMIDOL (ISOVUE-300) INJECTION 61% COMPARISON:  09/25/2007  FINDINGS: Despite efforts by the technologist and patient, motion artifact is present on today's exam and could not be eliminated. This reduces exam sensitivity and specificity. Lower chest: Moderate cardiomegaly, increased from prior CT from 2009. Descending thoracic aortic atherosclerotic calcification. Hepatobiliary: Cholecystectomy.  Otherwise unremarkable. Pancreas: Unremarkable Spleen: Unremarkable Adrenals/Urinary Tract: Adrenal glands normal. Renal atrophy with associated cortical thinning. Stomach/Bowel: Descending duodenum diverticulum, image 33/2. Otherwise unremarkable. Vascular/Lymphatic: Small likely chronic focal dissection just above the aortic hiatus in the thoracic aorta, images 24 through 27 of series 2, with calcification along the small focal dissection flap. Aortoiliac atherosclerotic vascular disease. No pathologic adenopathy identified. Reproductive: Uterus absent.  Adnexa unremarkable. Other: Unremarkable Musculoskeletal: Posterolateral rod and pedicle screw fixation at L2-L3-L4-L5 with interbody spacers at L4-5. Chronic grade 1 degenerative retrolisthesis at L2- 3 and anterolisthesis at L4-5. Multilevel posterior decompression. No specific complicating feature. IMPRESSION: 1. A specific cause for the patient's generalized abdominal pain weakness is not identified. 2. Moderate cardiomegaly. 3. 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 4.  Aortic Atherosclerosis (ICD10-I70.0). 5. Stable postoperative findings in the lumbar spine. 6. Bilateral renal atrophy. Electronically Signed   By: Van Clines M.D.   On: 09/05/2016 17:50     ____________________________________________   PROCEDURES Procedures  ____________________________________________   INITIAL IMPRESSION / ASSESSMENT AND PLAN / ED COURSE  Pertinent labs & imaging results that were available during my care of the patient were reviewed by me and considered in my medical decision making (see chart for details).  Patient presents with fatigue, acute on chronic by history. Found to have abdominal tenderness on exam. Initial labs unremarkable. I'll get a chest x-ray and CT scan of the abdomen and pelvis for further workup given the lack of localizing symptoms or other exam findings. She is well appearing, not in distress, not in pain, with unremarkable vital signs. If imaging is unremarkable I think the patient is suitable for outpatient follow-up with her primary care doctor. Clinical Course as of Sep 05 1809  Sun Sep 05, 2016  1556 Cxr neg DG Chest 2 View [PS]  1655 LFTs, TFTs, electrolytes unremarkable TSH: 2.093 [PS]  1807 Ct negative.  No acute pathology identified on workup today. Considering the patient's symptoms, medical history, and physical examination today, I have low suspicion for cholecystitis or biliary pathology, pancreatitis, perforation or bowel obstruction, hernia, intra-abdominal abscess, AAA or dissection, volvulus or intussusception, mesenteric ischemia, or appendicitis. Low suspicion of stroke meningitis encephalitis. Patient is not septic. We'll discharge home to follow up with primary care tomorrow.    [PS]    Clinical Course User Index [PS] Carrie Mew, MD     ____________________________________________   FINAL CLINICAL IMPRESSION(S) / ED DIAGNOSES  Final diagnoses:  Fatigue, unspecified type      New Prescriptions   No medications on file     Portions of this note were generated with dragon dictation software. Dictation errors may occur despite best attempts at proofreading.    Carrie Mew,  MD 09/05/16 [phone]

## 2016-09-05 NOTE — ED Notes (Signed)
Report received from Farley Ly. = pt back from ct and awaiting results.

## 2016-09-05 NOTE — ED Notes (Signed)
MD Stafford at bedside. 

## 2016-09-05 NOTE — ED Triage Notes (Signed)
C/O weakness x 2-3 days.

## 2016-09-07 ENCOUNTER — Telehealth: Payer: Self-pay | Admitting: Internal Medicine

## 2016-09-07 NOTE — Telephone Encounter (Signed)
Pt's niece called and stated that pt was in the Ed on Sunday and that she needs to follow up with Dr. Derrel Nip. Pt was in for fatigue/weakness. Please advise, thank you!  Call [facility] @ [phone]

## 2016-09-07 NOTE — Telephone Encounter (Signed)
LMTCB

## 2016-09-08 NOTE — Telephone Encounter (Signed)
Spoke with pt's niece, [name] and she stated that the pt was seen in the ED last Sunday for weakness and fatigue. The niece stated that the pt is still having trouble with the weakness and fatigue, not wanting to get out of bed very much. She stated that this has been going on for quite some time now. Would it be ok to schedule the pt on June 4th at 4:30pm or do you want to see her sooner? You have a 4:30pm open this Friday and a 4:30pm open one day next week. Please advise.

## 2016-09-08 NOTE — Telephone Encounter (Signed)
Patient's niece has requested a returned call [name] @ [phone]

## 2016-09-08 NOTE — Telephone Encounter (Signed)
June 4 th is fine  I need to keep those urgent slots for urgent matters!

## 2016-09-10 NOTE — Telephone Encounter (Signed)
Spoke with [name] pt's niece and scheduled the pt for Friday 09/17/2016 @ 10:30am. [name] gave a verbal understanding.

## 2016-09-10 NOTE — Telephone Encounter (Signed)
That slot is no longer available, please advise a new one, thanks

## 2016-09-17 ENCOUNTER — Encounter: Payer: Self-pay | Admitting: Internal Medicine

## 2016-09-17 ENCOUNTER — Ambulatory Visit (INDEPENDENT_AMBULATORY_CARE_PROVIDER_SITE_OTHER): Payer: Medicare Other | Admitting: Internal Medicine

## 2016-09-17 VITALS — BP 122/84 | HR 73 | Temp 97.8°F | Resp 15 | Ht 64.0 in | Wt 154.6 lb

## 2016-09-17 DIAGNOSIS — R0609 Other forms of dyspnea: Secondary | ICD-10-CM | POA: Diagnosis not present

## 2016-09-17 DIAGNOSIS — M545 Low back pain: Secondary | ICD-10-CM | POA: Diagnosis not present

## 2016-09-17 DIAGNOSIS — G8929 Other chronic pain: Secondary | ICD-10-CM

## 2016-09-17 DIAGNOSIS — I482 Chronic atrial fibrillation, unspecified: Secondary | ICD-10-CM

## 2016-09-17 DIAGNOSIS — I420 Dilated cardiomyopathy: Secondary | ICD-10-CM

## 2016-09-17 DIAGNOSIS — R5383 Other fatigue: Secondary | ICD-10-CM

## 2016-09-17 DIAGNOSIS — R0600 Dyspnea, unspecified: Secondary | ICD-10-CM

## 2016-09-17 DIAGNOSIS — E538 Deficiency of other specified B group vitamins: Secondary | ICD-10-CM

## 2016-09-17 LAB — VITAMIN B12: Vitamin B-12: 295 pg/mL (ref 211–911)

## 2016-09-17 MED ORDER — CYANOCOBALAMIN 1000 MCG/ML IJ SOLN
1000.0000 ug | Freq: Once | INTRAMUSCULAR | Status: AC
  Administered 2016-09-17: 1000 ug via INTRAMUSCULAR

## 2016-09-17 NOTE — Progress Notes (Addendum)
Subjective:  [name], female    DOB: September 22, 1929  Age: 83 y.o. MRN: [id number]  CC: The primary encounter diagnosis was B12 deficiency. Diagnoses of Chronic bilateral low back pain without sciatica, Chronic atrial fibrillation ([place]), Dilated cardiomyopathy ([place]), Fatigue, unspecified type, and Dyspnea on exertion were also pertinent to this visit.  HPI Alexandra Martinez presents for evaluation of persistent fatigue, complicated by dyspnea with exertion .  ,symptoms have been present since February.. Still doing her own housework, and as a result has little energy left for other activities because she is tired and her back starts hurting   Saw Kowalski:  Holter monitor done:  Some signs of sick sinus syndrome . Metoprolol dose maintained,  No medication changes.     He did note that her valvular disease had progressed slightly : she now has moderate MVR,  Mild AVR ,  EF 55% . EKG atrial fibrillation,  Rate controlled.   HWent to ER on May 20 for fatigue one Sunday morning     Outpatient Medications Prior to Visit  Medication Sig Dispense Refill  . acetaminophen (TYLENOL) 500 MG tablet Take 500 mg by mouth every 6 (six) hours as needed for headache.    . Esomeprazole Magnesium (NEXIUM PO) Take 20 mg by mouth as needed.     . furosemide (LASIX) 20 MG tablet Take by mouth.    . levothyroxine (SYNTHROID, LEVOTHROID) 50 MCG tablet TAKE 1 TABLET BY MOUTH EVERY DAY 30 tablet 0  . losartan (COZAAR) 50 MG tablet     . metoprolol (LOPRESSOR) 100 MG tablet TAKE 1 TABLET BY MOUTH TWICE DAILY 180 tablet 0  . ondansetron (ZOFRAN) 4 MG tablet TAKE 1 TABLET BY MOUTH EVERY 8 HOURS AS NEEDED FOR NAUSEA OR VOMITING 30 tablet 3  . polyethylene glycol powder (GLYCOLAX/MIRALAX) powder Take 17 g by mouth daily as needed for mild constipation. 3350 g 1  . pravastatin (PRAVACHOL) 20 MG tablet TAKE 1 TABLET(20 MG) BY MOUTH EVERY NIGHT 30 tablet 2  . Rivaroxaban (XARELTO) 15 MG TABS tablet Take 15 mg  by mouth daily with supper.    . Simethicone (EQ GAS RELIEF) 125 MG CAPS Take 125 mg by mouth daily as needed (gas). Reported on 07/01/2015    . traMADol (ULTRAM) 50 MG tablet Take 1 tablet (50 mg total) by mouth every 8 (eight) hours as needed. 90 tablet 5  . losartan (COZAAR) 25 MG tablet Take 25 mg by mouth every morning.     No facility-administered medications prior to visit.     Review of Systems;  Patient denies headache, fevers, malaise, unintentional weight loss, skin rash, eye pain, sinus congestion and sinus pain, sore throat, dysphagia,  hemoptysis , cough, dyspnea, wheezing, chest pain, palpitations, orthopnea, edema, abdominal pain, nausea, melena, diarrhea, constipation, flank pain, dysuria, hematuria, urinary  Frequency, nocturia, numbness, tingling, seizures,  Focal weakness, Loss of consciousness,  Tremor, insomnia, depression, anxiety, and suicidal ideation.      Objective:  BP 122/84 (BP Location: Left Arm, Patient Position: Sitting, Cuff Size: Normal)   Pulse 73   Temp 97.8 F (36.6 C) (Alexandra)   Resp 15   Ht 5\' 4"  (1.626 m)   Wt 154 lb 9.6 oz (70.1 kg)   SpO2 95%   BMI 26.54 kg/m   BP Readings from Last 3 Encounters:  09/17/16 122/84  09/05/16 (!) 169/82  07/23/16 (!) 142/84    Wt Readings from Last 3 Encounters:  09/17/16 154  lb 9.6 oz (70.1 kg)  09/05/16 150 lb (68 kg)  07/23/16 161 lb 12.8 oz (73.4 kg)    General appearance: alert, cooperative and appears stated age Ears: normal TM's and external ear canals both ears Throat: lips, mucosa, and tongue normal; teeth and gums normal Neck: no adenopathy, no carotid bruit, supple, symmetrical, trachea midline and thyroid not enlarged, symmetric, no tenderness/mass/nodules Back: symmetric, no curvature. ROM normal. No CVA tenderness. Lungs: clear to auscultation bilaterally Heart: regular rate and rhythm, S1, S2 normal, no murmur, click, rub or gallop Abdomen: soft, non-tender; bowel sounds normal; no  masses,  no organomegaly Pulses: 2+ and symmetric Skin: Skin color, texture, turgor normal. No rashes or lesions Lymph nodes: Cervical, supraclavicular, and axillary nodes normal.  No results found for: HGBA1C  Lab Results  Component Value Date   CREATININE 1.06 (H) 09/05/2016   CREATININE 1.02 05/24/2016   CREATININE 1.04 (H) 12/30/2015    Lab Results  Component Value Date   WBC 5.7 09/05/2016   HGB 12.1 09/05/2016   HCT 35.7 09/05/2016   PLT 138 (L) 09/05/2016   GLUCOSE 100 (H) 09/05/2016   CHOL 217 (H) 07/16/2013   TRIG 88.0 07/16/2013   HDL 41.70 07/16/2013   LDLCALC 158 (H) 07/16/2013   ALT 12 (L) 09/05/2016   AST 22 09/05/2016   NA 137 09/05/2016   K 4.3 09/05/2016   CL 106 09/05/2016   CREATININE 1.06 (H) 09/05/2016   BUN 17 09/05/2016   CO2 28 09/05/2016   TSH 2.093 09/05/2016   INR 1.6 (H) 08/04/2015    Dg Chest 2 View  Result Date: 09/05/2016 CLINICAL DATA:  Generalized weakness, abdominal tenderness EXAM: CHEST  2 VIEW COMPARISON:  01/03/2014 FINDINGS: Lungs are clear.  No pleural effusion or pneumothorax. Cardiomegaly. Lumbar spine fixation hardware, incompletely visualized. IMPRESSION: No evidence of acute cardiopulmonary disease. Electronically Signed   By: Julian Hy M.D.   On: 09/05/2016 15:48   Ct Abdomen Pelvis W Contrast  Result Date: 09/05/2016 CLINICAL DATA:  Generalized abdominal pain weakness for 2 days. EXAM: CT ABDOMEN AND PELVIS WITH CONTRAST TECHNIQUE: Multidetector CT imaging of the abdomen and pelvis was performed using the standard protocol following bolus administration of intravenous contrast. CONTRAST:  22mL ISOVUE-300 IOPAMIDOL (ISOVUE-300) INJECTION 61% COMPARISON:  09/25/2007 FINDINGS: Despite efforts by the technologist and patient, motion artifact is present on today's exam and could not be eliminated. This reduces exam sensitivity and specificity. Lower chest: Moderate cardiomegaly, increased from prior CT from 2009. Descending  thoracic aortic atherosclerotic calcification. Hepatobiliary: Cholecystectomy.  Otherwise unremarkable. Pancreas: Unremarkable Spleen: Unremarkable Adrenals/Urinary Tract: Adrenal glands normal. Renal atrophy with associated cortical thinning. Stomach/Bowel: Descending duodenum diverticulum, image 33/2. Otherwise unremarkable. Vascular/Lymphatic: Small likely chronic focal dissection just above the aortic hiatus in the thoracic aorta, images 24 through 27 of series 2, with calcification along the small focal dissection flap. Aortoiliac atherosclerotic vascular disease. No pathologic adenopathy identified. Reproductive: Uterus absent.  Adnexa unremarkable. Other: Unremarkable Musculoskeletal: Posterolateral rod and pedicle screw fixation at L2-L3-L4-L5 with interbody spacers at L4-5. Chronic grade 1 degenerative retrolisthesis at L2- 3 and anterolisthesis at L4-5. Multilevel posterior decompression. No specific complicating feature. IMPRESSION: 1. A specific cause for the patient's generalized abdominal pain weakness is not identified. 2. Moderate cardiomegaly. 3. 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 4.  Aortic Atherosclerosis (ICD10-I70.0). 5. Stable postoperative findings in the lumbar spine. 6.  Bilateral renal atrophy. Electronically Signed   By: Van Clines M.D.   On: 09/05/2016 17:50    Assessment & Plan:   Problem List Items Addressed This Visit    Fatigue    Multifactorial, aggravated by back pain and early sick sinus syndrome. Referral to PT and Heart Track   Lab Results  Component Value Date   VITAMINB12 295 09/17/2016   No results found for: FOLATE       Dyspnea on exertion    She has a dilated cardiomyopathy with moderate mitral valve regurgitation , mild aortic valve regurgitation,  EF 55% and atrial fibrillation.  She ges shourt of breath and fatigued  with household chores. Referral to Heart track recommended.       Chronic lower back pain   Relevant Orders   Ambulatory referral to Physical Therapy   B12 deficiency - Primary    New diagnosis,  B12 injections recomended      Relevant Medications   cyanocobalamin ((VITAMIN B-12)) injection 1,000 mcg (Completed)   Other Relevant Orders   Vitamin B12 (Completed)   Folate RBC (Completed)    Other Visit Diagnoses    Chronic atrial fibrillation ([place])       Relevant Orders   Ambulatory referral to Physical Therapy   Dilated cardiomyopathy ([place])       Relevant Orders   Ambulatory referral to Physical Therapy     A total of 25 minutes was spent with patient more than half of which was spent in counseling patient on the above mentioned issues , reviewing and explaining recent labs and imaging studies done, and coordination of care. I am having Ms. Melroy maintain her Simethicone, acetaminophen, Esomeprazole Magnesium (NEXIUM PO), polyethylene glycol powder, ondansetron, Rivaroxaban, furosemide, losartan, metoprolol tartrate, pravastatin, levothyroxine, and traMADol. We administered cyanocobalamin.  Meds ordered this encounter  Medications  . cyanocobalamin ((VITAMIN B-12)) injection 1,000 mcg    Medications Discontinued During This Encounter  Medication Reason  . losartan (COZAAR) 25 MG tablet Patient has not taken in last 30 days    Follow-up: No Follow-up on file.   Crecencio Mc, MD

## 2016-09-17 NOTE — Patient Instructions (Signed)
If you get a boost of energy from the b12 injection today,  We can continue weekly injections   Referrals for Heart Track and Physical therapy for your back are in process

## 2016-09-18 DIAGNOSIS — E538 Deficiency of other specified B group vitamins: Secondary | ICD-10-CM | POA: Insufficient documentation

## 2016-09-18 LAB — FOLATE RBC: RBC Folate: 785 ng/mL (ref >280)

## 2016-09-18 NOTE — Assessment & Plan Note (Addendum)
Multifactorial, aggravated by back pain and early sick sinus syndrome. Referral to PT and Heart Track   Lab Results  Component Value Date   VITAMINB12 295 09/17/2016   No results found for: FOLATE

## 2016-09-18 NOTE — Assessment & Plan Note (Signed)
New diagnosis,  B12 injections recomended

## 2016-09-19 NOTE — Assessment & Plan Note (Addendum)
She has a dilated cardiomyopathy with moderate mitral valve regurgitation , mild aortic valve regurgitation,  EF 55% and atrial fibrillation.  She ges shourt of breath and fatigued with household chores. Referral to Heart track recommended.

## 2016-09-21 ENCOUNTER — Other Ambulatory Visit: Payer: Self-pay | Admitting: *Deleted

## 2016-09-21 DIAGNOSIS — R0609 Other forms of dyspnea: Principal | ICD-10-CM

## 2016-09-21 DIAGNOSIS — R0600 Dyspnea, unspecified: Secondary | ICD-10-CM

## 2016-09-25 ENCOUNTER — Other Ambulatory Visit: Payer: Self-pay | Admitting: Internal Medicine

## 2016-09-29 ENCOUNTER — Ambulatory Visit: Payer: Medicare Other

## 2016-09-30 ENCOUNTER — Ambulatory Visit: Payer: Medicare Other

## 2016-10-04 ENCOUNTER — Ambulatory Visit: Payer: Medicare Other

## 2016-10-18 ENCOUNTER — Other Ambulatory Visit: Payer: Self-pay | Admitting: Internal Medicine

## 2016-10-23 ENCOUNTER — Other Ambulatory Visit: Payer: Self-pay | Admitting: Internal Medicine

## 2016-10-26 DIAGNOSIS — I10 Essential (primary) hypertension: Secondary | ICD-10-CM | POA: Diagnosis not present

## 2016-10-26 DIAGNOSIS — N2581 Secondary hyperparathyroidism of renal origin: Secondary | ICD-10-CM | POA: Diagnosis not present

## 2016-10-26 DIAGNOSIS — N183 Chronic kidney disease, stage 3 (moderate): Secondary | ICD-10-CM | POA: Diagnosis not present

## 2016-10-26 DIAGNOSIS — R601 Generalized edema: Secondary | ICD-10-CM | POA: Diagnosis not present

## 2016-10-26 DIAGNOSIS — R600 Localized edema: Secondary | ICD-10-CM | POA: Diagnosis not present

## 2016-11-30 ENCOUNTER — Ambulatory Visit (INDEPENDENT_AMBULATORY_CARE_PROVIDER_SITE_OTHER): Payer: Medicare Other

## 2016-11-30 VITALS — BP 128/64 | HR 68 | Temp 98.3°F | Resp 16 | Ht 64.0 in | Wt 159.0 lb

## 2016-11-30 DIAGNOSIS — Z0000 Encounter for general adult medical examination without abnormal findings: Secondary | ICD-10-CM

## 2016-11-30 DIAGNOSIS — Z1389 Encounter for screening for other disorder: Secondary | ICD-10-CM | POA: Diagnosis not present

## 2016-11-30 DIAGNOSIS — Z1331 Encounter for screening for depression: Secondary | ICD-10-CM

## 2016-11-30 NOTE — Patient Instructions (Addendum)
  Alexandra Martinez , Thank you for taking time to come for your Medicare Wellness Visit. I appreciate your ongoing commitment to your health goals. Please review the following plan we discussed and let me know if I can assist you in the future.   Follow up with Dr. Derrel Nip as needed.    Bring a copy of your [place] and/or Living Will to be scanned into chart once completed.  Have a great day!  These are the goals we discussed: Goals    . Healthy Lifestyle          Start chair exercises.   Stay hydrated!  Drink plenty of fluids/water. Trade a bottle for soda for a bottle of water. Low carb foods.  Lean meats, fruits and vegetables.       This is a list of the screening recommended for you and due dates:  Health Maintenance  Topic Date Due  . Tetanus Vaccine  04/10/1949  . Flu Shot  11/17/2016  . DEXA scan (bone density measurement)  Completed  . Pneumonia vaccines  Completed

## 2016-11-30 NOTE — Progress Notes (Signed)
Subjective:   CAELA HUOT is a 83 y.o. female who presents for Medicare Annual (Subsequent) preventive examination.  Review of Systems:  No ROS.  Medicare Wellness Visit. Additional risk factors are reflected in the social history.  Cardiac Risk Factors include: advanced age (>19men, >81 women)     Objective:     Vitals: BP 128/64 (BP Location: Left Arm, Patient Position: Sitting, Cuff Size: Normal)   Pulse 68   Temp 98.3 F (36.8 C) (Oral)   Resp 16   Ht 5\' 4"  (1.626 m)   Wt 159 lb (72.1 kg)   SpO2 96%   BMI 27.29 kg/m   Body mass index is 27.29 kg/m.   Tobacco History  Smoking Status  . Never Smoker  Smokeless Tobacco  . Never Used     Counseling given: Not Answered   Past Medical History:  Diagnosis Date  . Anemia    r/t uterine fibroids  . Atrial fib/flutter, transient    [facility] admission for RVR 03/2009  . DDD (degenerative disc disease)   . Dysrhythmia    A-FIB  . GERD (gastroesophageal reflux disease)   . H/O bladder infections   . H/O hiatal hernia   . H/O thyroid nodule    surgically removed  . Heart murmur    moderate MR/TR 08/2013 echo [name])  . Hemorrhoids   . Hiatal hernia with gastroesophageal reflux 2008   by EGD,  [name]  . High cholesterol   . History of uterine fibroid   . Hydronephrosis    uretero- with congenital UPJ obstruction- Dr. Edrick Oh  . Hydronephrosis with ureteropelvic junction obstruction   . Hypertension    [facility]  . Hypothyroidism   . Nocturia   . Osteoporosis   . Polyuria   . Recurrent UTI    Past Surgical History:  Procedure Laterality Date  . ADENOIDECTOMY N/A 01/05/2016   Procedure: ADENOIDECTOMY;  Surgeon: Margaretha Sheffield, MD;  Location: [facility] ORS;  Service: ENT;  Laterality: N/A;  . BACK SURGERY    . CATARACT EXTRACTION     left  . CERVICAL DISC SURGERY  1980's  . CHOLECYSTECTOMY    . NASOPHARYNGOSCOPY  01/05/2016   Procedure: NASOPHARYNGOSCOPY;  Surgeon: Margaretha Sheffield, MD;  Location:  [facility] ORS;  Service: ENT;;  . THYROID LOBECTOMY     partial, right lobe  . TOTAL ABDOMINAL HYSTERECTOMY W/ BILATERAL SALPINGOOPHORECTOMY     Family History  Problem Relation Age of Onset  . Heart disease Mother   . Coronary artery disease Father        MI's   History  Sexual Activity  . Sexual activity: No    Outpatient Encounter Prescriptions as of 11/30/2016  Medication Sig  . acetaminophen (TYLENOL) 500 MG tablet Take 500 mg by mouth every 6 (six) hours as needed for headache.  . Esomeprazole Magnesium (NEXIUM PO) Take 20 mg by mouth as needed.   . furosemide (LASIX) 20 MG tablet Take by mouth.  . levothyroxine (SYNTHROID, LEVOTHROID) 50 MCG tablet TAKE 1 TABLET BY MOUTH EVERY DAY  . losartan (COZAAR) 50 MG tablet   . metoprolol (LOPRESSOR) 100 MG tablet TAKE 1 TABLET BY MOUTH TWICE DAILY  . metoprolol tartrate (LOPRESSOR) 100 MG tablet TAKE 1 TABLET BY MOUTH TWICE DAILY  . ondansetron (ZOFRAN) 4 MG tablet TAKE 1 TABLET BY MOUTH EVERY 8 HOURS AS NEEDED FOR NAUSEA OR VOMITING  . polyethylene glycol powder (GLYCOLAX/MIRALAX) powder Take 17 g by mouth daily as needed for  mild constipation.  . pravastatin (PRAVACHOL) 20 MG tablet TAKE 1 TABLET(20 MG) BY MOUTH EVERY NIGHT  . Rivaroxaban (XARELTO) 15 MG TABS tablet Take 15 mg by mouth daily with supper.  . Simethicone (EQ GAS RELIEF) 125 MG CAPS Take 125 mg by mouth daily as needed (gas). Reported on 07/01/2015  . traMADol (ULTRAM) 50 MG tablet Take 1 tablet (50 mg total) by mouth every 8 (eight) hours as needed.   No facility-administered encounter medications on file as of 11/30/2016.     Activities of Daily Living In your present state of health, do you have any difficulty performing the following activities: 11/30/2016 12/29/2015  Hearing? N -  Vision? N -  Difficulty concentrating or making decisions? Y -  Comment Some difficulty remembering names -  Walking or climbing stairs? N -  Dressing or bathing? N -  Doing errands,  shopping? N N  Preparing Food and eating ? N -  Using the Toilet? N -  In the past six months, have you accidently leaked urine? Y -  Comment Managed with a daily pad.  Followed by Urology and PCP. -  Do you have problems with loss of bowel control? N -  Managing your Medications? N -  Managing your Finances? N -  Housekeeping or managing your Housekeeping? N -  Some recent data might be hidden    Patient Care Team: Crecencio Mc, MD as PCP - General (Internal Medicine)    Assessment:    This is a routine wellness examination for Alyxandra. The goal of the wellness visit is to assist the patient how to close the gaps in care and create a preventative care plan for the patient.   The roster of all physicians providing medical care to patient is listed in the Snapshot section of the chart.  Taking calcium VIT D as appropriate/Osteoporosis risk reviewed.    Safety issues reviewed; Smoke and carbon monoxide detectors in the home. No firearms in the home.  Wears seatbelts when driving or riding with others. Patient does wear sunscreen or protective clothing when in direct sunlight. No violence in the home.  Depression- PHQ 2 &9 complete.  No signs/symptoms or verbal communication regarding little pleasure in doing things, feeling down, depressed or hopeless. No changes in sleeping, energy, eating, concentrating.  No thoughts of self harm or harm towards others.  Time spent on this topic is 8 minutes.   Patient is alert, normal appearance, oriented to person/place/and time. Correctly identified the president of the [place], and performing simple calculations. Displays appropriate judgement and can read correct time from watch face.   No new identified risk were noted.  No failures at ADL's or IADL's.    BMI- discussed the importance of a healthy diet, water intake and the benefits of aerobic exercise. Educational material provided.   24 hour diet recall: Breakfast: cereal Lunch:  none Dinner: chicken pot pie   Daily fluid intake: 4 cups of caffeine, 2 cups of water.    Dental- every 12 months.  Eye- Visual acuity not assessed per patient preference since they have regular follow up with the ophthalmologist.  Wears corrective lenses.  Sleep patterns- Sleeps 8 hours at night.  Wakes feeling rested.  TDAP vaccine deferred per patient preference.  Follow up with insurance.  Educational material provided.   Patient Concerns: None at this time. Follow up with PCP as needed.  Exercise Activities and Dietary recommendations Current Exercise Habits: The patient does not participate in regular  exercise at present  Goals    . Healthy Lifestyle          Start chair exercises.   Stay hydrated!  Drink plenty of fluids/water. Trade a bottle for soda for a bottle of water. Low carb foods.  Lean meats, fruits and vegetables.      Fall Risk Fall Risk  11/30/2016 07/23/2016 07/01/2015 07/05/2014 09/10/2012  Falls in the past year? No No No No No   Depression Screen PHQ 2/9 Scores 11/30/2016 07/23/2016 07/01/2015 07/05/2014  PHQ - 2 Score 0 1 0 0  PHQ- 9 Score 0 - - -     Cognitive Function MMSE - Mini Mental State Exam 11/30/2016 07/01/2015  Orientation to time 5 5  Orientation to Place 5 5  Registration 3 3  Attention/ Calculation 5 5  Recall 0 3  Language- name 2 objects 2 2  Language- repeat 1 1  Language- follow 3 step command 3 3  Language- read & follow direction 1 1  Write a sentence 1 1  Copy design 0 1  Copy design-comments difficulty copying the design -  Total score 26 30        Immunization History  Administered Date(s) Administered  . Influenza Split 02/22/2011, 01/14/2012  . Influenza, High Dose Seasonal PF 02/03/2015  . Influenza,inj,Quad PF,36+ Mos 02/08/2013, 01/10/2014  . Pneumococcal Conjugate-13 02/03/2015  . Pneumococcal Polysaccharide-23 05/14/2004   Screening Tests Health Maintenance  Topic Date Due  . TETANUS/TDAP  04/10/1949  .  INFLUENZA VACCINE  11/17/2016  . DEXA SCAN  Completed  . PNA vac Low Risk Adult  Completed      Plan:   End of life planning; Advanced aging; Advanced directives discussed.  No HCPOA/Living Will.  Additional information provided to help them start the conversation with family.  Copy of HCPOA/Living Will requested upon completion. Time spent on this topic is 20 minutes.  I have personally reviewed and noted the following in the patient's chart:   . Medical and social history . Use of alcohol, tobacco or illicit drugs  . Current medications and supplements . Functional ability and status . Nutritional status . Physical activity . Advanced directives . List of other physicians . Hospitalizations, surgeries, and ER visits in previous 12 months . Vitals . Screenings to include cognitive, depression, and falls . Referrals and appointments  In addition, I have reviewed and discussed with patient certain preventive protocols, quality metrics, and best practice recommendations. A written personalized care plan for preventive services as well as general preventive health recommendations were provided to patient.     Varney Biles, LPN  [id number]   Reviewed above information.  Agree with assessment and plan.  Dr Nicki Reaper

## 2016-12-01 DIAGNOSIS — I872 Venous insufficiency (chronic) (peripheral): Secondary | ICD-10-CM | POA: Diagnosis not present

## 2016-12-01 DIAGNOSIS — E782 Mixed hyperlipidemia: Secondary | ICD-10-CM | POA: Diagnosis not present

## 2016-12-01 DIAGNOSIS — N183 Chronic kidney disease, stage 3 (moderate): Secondary | ICD-10-CM | POA: Diagnosis not present

## 2016-12-01 DIAGNOSIS — I482 Chronic atrial fibrillation: Secondary | ICD-10-CM | POA: Diagnosis not present

## 2016-12-06 ENCOUNTER — Other Ambulatory Visit: Payer: Self-pay | Admitting: Internal Medicine

## 2016-12-07 ENCOUNTER — Other Ambulatory Visit: Payer: Self-pay

## 2016-12-07 MED ORDER — PRAVASTATIN SODIUM 20 MG PO TABS: ORAL_TABLET | ORAL | 2 refills | Status: DC

## 2017-01-07 DIAGNOSIS — Z23 Encounter for immunization: Secondary | ICD-10-CM | POA: Diagnosis not present

## 2017-01-27 ENCOUNTER — Ambulatory Visit: Payer: Medicare Other | Admitting: Internal Medicine

## 2017-02-16 DIAGNOSIS — M546 Pain in thoracic spine: Secondary | ICD-10-CM | POA: Diagnosis not present

## 2017-02-16 DIAGNOSIS — Z6825 Body mass index (BMI) 25.0-25.9, adult: Secondary | ICD-10-CM | POA: Diagnosis not present

## 2017-02-16 DIAGNOSIS — M545 Low back pain: Secondary | ICD-10-CM | POA: Diagnosis not present

## 2017-02-16 DIAGNOSIS — Z981 Arthrodesis status: Secondary | ICD-10-CM | POA: Diagnosis not present

## 2017-02-16 DIAGNOSIS — M5136 Other intervertebral disc degeneration, lumbar region: Secondary | ICD-10-CM | POA: Diagnosis not present

## 2017-02-16 DIAGNOSIS — M47816 Spondylosis without myelopathy or radiculopathy, lumbar region: Secondary | ICD-10-CM | POA: Diagnosis not present

## 2017-02-23 ENCOUNTER — Ambulatory Visit: Payer: Medicare Other | Admitting: Internal Medicine

## 2017-02-23 DIAGNOSIS — Z0289 Encounter for other administrative examinations: Secondary | ICD-10-CM

## 2017-03-11 ENCOUNTER — Other Ambulatory Visit: Payer: Self-pay | Admitting: Internal Medicine

## 2017-03-15 ENCOUNTER — Ambulatory Visit: Payer: Medicare Other | Attending: Neurosurgery

## 2017-03-15 DIAGNOSIS — M6281 Muscle weakness (generalized): Secondary | ICD-10-CM | POA: Diagnosis not present

## 2017-03-15 DIAGNOSIS — G8929 Other chronic pain: Secondary | ICD-10-CM | POA: Insufficient documentation

## 2017-03-15 DIAGNOSIS — M545 Low back pain: Secondary | ICD-10-CM | POA: Insufficient documentation

## 2017-03-15 NOTE — Therapy (Signed)
[place] [facility] [address], [place], [address] Phone: [phone]   Fax:  [phone]  Physical Therapy Evaluation  Patient Details  Name: Alexandra Martinez MRN: [id number] Date of Birth: February 17, 1977 Referring Provider: Jovita Gamma MD   Encounter Date: 03/15/2017  PT End of Session - 03/15/17 1208    Visit Number  1    Number of Visits  13    Date for PT Re-Evaluation  04/26/17    Authorization Type  1 / 10 G Code    PT Start Time  1100    PT Stop Time  1200    PT Time Calculation (min)  60 min    Activity Tolerance  Patient tolerated treatment well    Behavior During Therapy  [facility] for tasks assessed/performed       Past Medical History:  Diagnosis Date  . Anemia    r/t uterine fibroids  . Atrial fib/flutter, transient    [facility] admission for RVR 03/2009  . DDD (degenerative disc disease)   . Dysrhythmia    A-FIB  . GERD (gastroesophageal reflux disease)   . H/O bladder infections   . H/O hiatal hernia   . H/O thyroid nodule    surgically removed  . Heart murmur    moderate MR/TR 08/2013 echo [name])  . Hemorrhoids   . Hiatal hernia with gastroesophageal reflux 2008   by EGD,  [name]  . High cholesterol   . History of uterine fibroid   . Hydronephrosis    uretero- with congenital UPJ obstruction- Dr. Edrick Oh  . Hydronephrosis with ureteropelvic junction obstruction   . Hypertension    [facility]  . Hypothyroidism   . Nocturia   . Osteoporosis   . Polyuria   . Recurrent UTI     Past Surgical History:  Procedure Laterality Date  . ADENOIDECTOMY N/A 01/05/2016   Procedure: ADENOIDECTOMY;  Surgeon: Margaretha Sheffield, MD;  Location: [facility] ORS;  Service: ENT;  Laterality: N/A;  . BACK SURGERY    . CATARACT EXTRACTION     left  . CERVICAL DISC SURGERY  1944's  . CHOLECYSTECTOMY    . NASOPHARYNGOSCOPY  01/05/2016   Procedure: NASOPHARYNGOSCOPY;  Surgeon: Margaretha Sheffield, MD;   Location: [facility] ORS;  Service: ENT;;  . THYROID LOBECTOMY     partial, right lobe  . TOTAL ABDOMINAL HYSTERECTOMY W/ BILATERAL SALPINGOOPHORECTOMY      There were no vitals filed for this visit.   Subjective Assessment - 03/15/17 1115    Subjective  Patient reports increased low back pain which started increasing in intensity 3 years ago. Patient reports increased pain with lifting, bending, walking, and performing household chores. Patient reports decreased pain after sitting down for a period of time. Patient reports she has to break her household chores to seperate days to decrease pain. Patient reports the greatest difficulty with bending over.     Pertinent History  PMH: A- fib, History of LBP(several years does not give exact date), gallbladder removal, history of neck pain, L2-5 Decompression    Limitations  Lifting;Standing;Walking    Currently in Pain?  Yes    Pain Score  0-No pain Worst pain: 8/10    Pain Location  Back    Pain Orientation  Mid    Pain Descriptors / Indicators  Aching;Discomfort    Pain Type  Chronic pain    Pain Onset  More than a month ago    Pain Frequency  Intermittent         [facility] PT Assessment - 03/15/17 1111      Assessment   Medical Diagnosis  Low back pain    Referring Provider  Jovita Gamma MD    Onset Date/Surgical Date  04/19/13    Hand Dominance  Right    Next MD Visit  unknown    Prior Therapy  yes - for lower back       Balance Screen   Has the patient fallen in the past 6 months  No    Has the patient had a decrease in activity level because of a fear of falling?   Yes    Is the patient reluctant to leave their home because of a fear of falling?   No      Prior Function   Level of Independence  Independent    Vocation  Retired    Biomedical scientist  N/A    Leisure  watch TV, listen to the radio      Observation/Other Assessments   Observations  Decreased lumbar lordosis in standing    Other Surveys   Other Surveys     Modified [place]  58%      Sensation   Light Touch  Appears Intact      Functional Tests   Functional tests  Sit to Stand      Sit to Stand   Comments  Slow to perform requires use of hands in beginning of treatment session      Posture/Postural Control   Posture Comments  Decreased lordosis along lumbar spine      ROM / Strength   AROM / PROM / Strength  AROM;Strength      AROM   AROM Assessment Site  Lumbar All other tested AROM WNL    Lumbar Flexion  WNL- increased pain at end range    Lumbar Extension  75% limited Increased pain    Lumbar - Right Side Bend  25% limited    Lumbar - Left Side Bend  25% limited    Lumbar - Right Rotation  WNL    Lumbar - Left Rotation  WNL      Strength   Strength Assessment Site  Hip;Knee;Ankle;Lumbar    Right/Left Hip  Right;Left    Right Hip Flexion  4/5    Right Hip Extension  4-/5    Right Hip External Rotation   4-/5    Right Hip ABduction  4-/5    Right Hip ADduction  4-/5    Left Hip Flexion  4/5    Left Hip Extension  4-/5    Left Hip External Rotation  4-/5    Left Hip ABduction  4-/5    Left Hip ADduction  4-/5    Right/Left Knee  Right;Left    Right Knee Flexion  4-/5    Right Knee Extension  4+/5    Left Knee Flexion  4-/5    Left Knee Extension  4+/5    Right/Left Ankle  Right;Left    Right Ankle Dorsiflexion  4/5    Left Ankle Dorsiflexion  4/5      Palpation   Palpation comment  No increased pain upon palpation only with movement      Special Tests    Special Tests  Lumbar    Lumbar Tests  FABER test      FABER test   findings  Positive    Side  Right    Comment  Increased hip pain      Transfers   Five time sit to stand comments   18.5sec to perform      Ambulation/Gait   Gait Comments  Decreased speed and heel strike B       Objective measurements completed on examination: See above findings.    TREATMENT Therapeutic Exercise: Bridges in hooklying -- x 10 Clamshells performed B -- x 10 LTR  in hooklying -- x 10   Patient demonstrates decreased pain at end of the session     PT Education - 03/15/17 1205    Education provided  Yes    Education Details  HEP: bridges in hooklying, LTR, clamshells in sidelying    Person(s) Educated  Patient    Methods  Demonstration;Explanation    Comprehension  Verbalized understanding;Returned demonstration          PT Long Term Goals - 03/15/17 1218      PT LONG TERM GOAL #1   Title  Patient will be independent with HEP to continue benefits of therapy after discharge.     Time  6    Period  Weeks    Status  New    Target Date  04/26/17      PT LONG TERM GOAL #2   Title  Patient will be able to perform all household chores without increase in pain to improve functional capacity.    Baseline  Requires a sitting rest break after 4mins of activity    Time  6    Period  Weeks    Status  New    Target Date  04/26/16      PT LONG TERM GOAL #3   Title  Patient will improve MODI to under 20% to improve self perceived lumbar function and greater ability to perform social activities without increase in pain.     Baseline  58% MODI    Time  6    Period  Weeks    Status  New    Target Date  04/26/16             Plan - 03/15/17 1209    Clinical Impression Statement  Patient is a right hand dominant 83 yo female presenting with increased low back pain from insidious onset several years prior (patient not able to give exact date). Patient demonstrates increased low back dysfunction with symptoms congruent with poor muscular motor and stabilization. Patient demonstrates improvement of pain after performing exercises indicating both improved muscular coordination and stabilization. Patient will benefit from further skilled therapy to return to prior level of function.     History and Personal Factors relevant to plan of care:  Chronic back pain, lumbar surgery    Clinical Presentation  Evolving    Clinical Presentation due to:  Pain  not improving over the past month    Clinical Decision Making  Moderate    Rehab Potential  Good    Clinical Impairments Affecting Rehab Potential  (+) highly motivated; (-)     PT Frequency  2x / week    PT Duration  6 weeks    PT Treatment/Interventions  Passive range of motion;Manual techniques;Cryotherapy;Electrical Stimulation;Moist Heat;Ultrasound;Iontophoresis 4mg /ml Dexamethasone;Gait training;Therapeutic activities;Therapeutic exercise;Patient/family education;Neuromuscular re-education;Balance training;Dry needling    PT Next Visit Plan  Improve strength and endurance    PT Home Exercise Plan  See education    Consulted and Agree with Plan of Care  Patient       Patient will benefit from skilled  therapeutic intervention in order to improve the following deficits and impairments:  Decreased activity tolerance, Decreased endurance, Decreased range of motion, Decreased strength, Difficulty walking, Decreased mobility, Increased muscle spasms, Postural dysfunction, Decreased coordination, Pain, Impaired sensation, Increased fascial restricitons  Visit Diagnosis: Chronic midline low back pain without sciatica - Plan: PT plan of care cert/re-cert  Muscle weakness (generalized) - Plan: PT plan of care cert/re-cert  G-Codes - 56/38/93 1229    Functional Assessment Tool Used (Outpatient Only)  clinical judgement, 5XSTS, MMT, AROM, MODI    Functional Limitation  Changing and maintaining body position    Changing and Maintaining Body Position Current Status (T3428)  At least 40 percent but less than 60 percent impaired, limited or restricted    Changing and Maintaining Body Position Goal Status (J6811)  At least 1 percent but less than 20 percent impaired, limited or restricted        Problem List Patient Active Problem List   Diagnosis Date Noted  . B12 deficiency 09/18/2016  . Right hip pain 08/05/2015  . Anemia 08/05/2015  . Vitamin D deficiency 08/05/2015  . Hemoptysis  08/04/2015  . Constipation 02/04/2015  . Left knee pain 01/05/2015  . Status post laminectomy with spinal fusion 02/23/2014  . Fatigue 07/07/2013  . Dyspnea on exertion 08/12/2012  . Chronic lower back pain 08/12/2012  . Urge urinary incontinence 08/12/2012  . Personal history of colonic polyps 08/12/2012  . Seasonal rhinitis 01/16/2012  . Hiatal hernia with gastroesophageal reflux   . Fatigue 09/16/2011  . Controlled atrial fibrillation ([place]) 12/15/2010  . Irritable bowel syndrome without diarrhea 12/15/2010  . Hypothyroid 12/15/2010  . Long term current use of anticoagulant therapy 12/15/2010    Blythe Stanford, PT DPT 03/15/2017, 12:55 PM  [place] [facility] [address] [place], [address] Phone: [phone]   Fax:  [phone]  Name: JANMARIE SMOOT MRN: [id number] Date of Birth: 1930-03-06

## 2017-03-17 ENCOUNTER — Ambulatory Visit: Payer: Medicare Other

## 2017-03-17 DIAGNOSIS — M545 Low back pain, unspecified: Secondary | ICD-10-CM

## 2017-03-17 DIAGNOSIS — G8929 Other chronic pain: Secondary | ICD-10-CM | POA: Diagnosis not present

## 2017-03-17 DIAGNOSIS — M6281 Muscle weakness (generalized): Secondary | ICD-10-CM | POA: Diagnosis not present

## 2017-03-17 NOTE — Therapy (Signed)
[place] [facility] [address] [place], [address] Phone: [phone]   Fax:  [phone]  Physical Therapy Treatment  Patient Details  Name: Alexandra Martinez MRN: [id number] Date of Birth: February 23, 1930 Referring Provider: Jovita Gamma MD   Encounter Date: 03/17/2017  PT End of Session - 03/17/17 1103    Visit Number  2    Number of Visits  13    Date for PT Re-Evaluation  04/26/17    Authorization Type  2 / 10 G code     PT Start Time  1055    PT Stop Time  1140    PT Time Calculation (min)  45 min    Activity Tolerance  Patient tolerated treatment well    Behavior During Therapy  [facility] for tasks assessed/performed       Past Medical History:  Diagnosis Date  . Anemia    r/t uterine fibroids  . Atrial fib/flutter, transient    [facility] admission for RVR 03/2009  . DDD (degenerative disc disease)   . Dysrhythmia    A-FIB  . GERD (gastroesophageal reflux disease)   . H/O bladder infections   . H/O hiatal hernia   . H/O thyroid nodule    surgically removed  . Heart murmur    moderate MR/TR 08/2013 echo [name])  . Hemorrhoids   . Hiatal hernia with gastroesophageal reflux 2008   by EGD,  [name]  . High cholesterol   . History of uterine fibroid   . Hydronephrosis    uretero- with congenital UPJ obstruction- Dr. Edrick Oh  . Hydronephrosis with ureteropelvic junction obstruction   . Hypertension    [facility]  . Hypothyroidism   . Nocturia   . Osteoporosis   . Polyuria   . Recurrent UTI     Past Surgical History:  Procedure Laterality Date  . ADENOIDECTOMY N/A 01/05/2016   Procedure: ADENOIDECTOMY;  Surgeon: Margaretha Sheffield, MD;  Location: [facility] ORS;  Service: ENT;  Laterality: N/A;  . BACK SURGERY    . CATARACT EXTRACTION     left  . CERVICAL DISC SURGERY  1980's  . CHOLECYSTECTOMY    . NASOPHARYNGOSCOPY  01/05/2016   Procedure: NASOPHARYNGOSCOPY;  Surgeon: Margaretha Sheffield, MD;   Location: [facility] ORS;  Service: ENT;;  . THYROID LOBECTOMY     partial, right lobe  . TOTAL ABDOMINAL HYSTERECTOMY W/ BILATERAL SALPINGOOPHORECTOMY      There were no vitals filed for this visit.  Subjective Assessment - 03/17/17 1101    Subjective  Patinet reports she's been feeling "much better" since the previous session. Patient reports her pain has decreased considerably.     Pertinent History  PMH: A- fib, History of LBP(several years does not give exact date), gallbladder removal, history of neck pain, L2-5 Decompression    Limitations  Lifting;Standing;Walking    Currently in Pain?  Yes    Pain Score  5     Pain Location  Back    Pain Orientation  Mid    Pain Descriptors / Indicators  Aching;Discomfort    Pain Type  Chronic pain    Pain Onset  More than a month ago    Pain Frequency  Intermittent          TREATMENT Therapeutic Exercise: Sit to stands - 2 x 10  Seated hip adduction with glute squeezes - 2 x 10 Hookyling arms over head with TrA activation - x 10, x10 without ball overhead [employer]  with Hip abduction - x 10 against manual resistance Standing arm push downs - 2 x 10  Hip abduction in standing - 2 x 10  Mini Squats with focus on improving hip hinging - 2 x 10 Lumbar Derotation in standing at OMEGA - x10  Standing hip extension - 2 x 10     Patient demonstrates decreased pain at end of the session   PT Education - 03/17/17 1102    Education provided  Yes    Education Details  HEP: seated hip adduction with glute squeeze    Person(s) Educated  Patient    Methods  Explanation;Demonstration    Comprehension  Verbalized understanding;Returned demonstration          PT Long Term Goals - 03/15/17 1218      PT LONG TERM GOAL #1   Title  Patient will be independent with HEP to continue benefits of therapy after discharge.     Time  6    Period  Weeks    Status  New    Target Date  04/26/17      PT LONG TERM GOAL #2   Title  Patient will be able  to perform all household chores without increase in pain to improve functional capacity.    Baseline  Requires a sitting rest break after 69mins of activity    Time  6    Period  Weeks    Status  New    Target Date  04/26/16      PT LONG TERM GOAL #3   Title  Patient will improve MODI to under 20% to improve self perceived lumbar function and greater ability to perform social activities without increase in pain.     Baseline  58% MODI    Time  6    Period  Weeks    Status  New    Target Date  04/26/16            Plan - 03/17/17 1146    Clinical Impression Statement  Patient demonstrates poor motor control with activities requiring frequent cueing on form and technique. Patient reports decreased lumbar pain after performing core stabilization exercises indicating aberrant movement with activity. Patient demonstrates poor coordination with bending and lifting items from the ground and will benefit from further skilled therapy to return to prior level of function.     Rehab Potential  Good    Clinical Impairments Affecting Rehab Potential  (+) highly motivated; (-)     PT Frequency  2x / week    PT Duration  6 weeks    PT Treatment/Interventions  Passive range of motion;Manual techniques;Cryotherapy;Electrical Stimulation;Moist Heat;Ultrasound;Iontophoresis 4mg /ml Dexamethasone;Gait training;Therapeutic activities;Therapeutic exercise;Patient/family education;Neuromuscular re-education;Balance training;Dry needling    PT Next Visit Plan  Improve strength and endurance    PT Home Exercise Plan  See education    Consulted and Agree with Plan of Care  Patient       Patient will benefit from skilled therapeutic intervention in order to improve the following deficits and impairments:  Decreased activity tolerance, Decreased endurance, Decreased range of motion, Decreased strength, Difficulty walking, Decreased mobility, Increased muscle spasms, Postural dysfunction, Decreased coordination,  Pain, Impaired sensation, Increased fascial restricitons  Visit Diagnosis: Muscle weakness (generalized)  Chronic midline low back pain without sciatica     Problem List Patient Active Problem List   Diagnosis Date Noted  . B12 deficiency 09/18/2016  . Right hip pain 08/05/2015  . Anemia 08/05/2015  . Vitamin D deficiency  08/05/2015  . Hemoptysis 08/04/2015  . Constipation 02/04/2015  . Left knee pain 01/05/2015  . Status post laminectomy with spinal fusion 02/23/2014  . Fatigue 07/07/2013  . Dyspnea on exertion 08/12/2012  . Chronic lower back pain 08/12/2012  . Urge urinary incontinence 08/12/2012  . Personal history of colonic polyps 08/12/2012  . Seasonal rhinitis 01/16/2012  . Hiatal hernia with gastroesophageal reflux   . Fatigue 09/16/2011  . Controlled atrial fibrillation ([place]) 12/15/2010  . Irritable bowel syndrome without diarrhea 12/15/2010  . Hypothyroid 12/15/2010  . Long term current use of anticoagulant therapy 12/15/2010    Blythe Stanford, PT DPT 03/17/2017, 11:49 AM  [place] [facility] [address], [place], [address] Phone: [phone]   Fax:  [phone]  Name: Alexandra Martinez MRN: [id number] Date of Birth: 10/08/1929

## 2017-03-21 ENCOUNTER — Ambulatory Visit: Payer: Medicare Other | Attending: Neurosurgery

## 2017-03-21 DIAGNOSIS — M545 Low back pain: Secondary | ICD-10-CM | POA: Diagnosis not present

## 2017-03-21 DIAGNOSIS — M6281 Muscle weakness (generalized): Secondary | ICD-10-CM | POA: Diagnosis not present

## 2017-03-21 DIAGNOSIS — G8929 Other chronic pain: Secondary | ICD-10-CM | POA: Insufficient documentation

## 2017-03-21 NOTE — Therapy (Signed)
[place] [address] [place], [address] Phone: [phone]   Fax:  [phone]  Physical Therapy Treatment  Patient Details  Name: Alexandra Martinez MRN: [id number] Date of Birth: 03/14/1930 Referring Provider: Jovita Gamma MD   Encounter Date: 03/21/2017  PT End of Session - 03/21/17 1417    Visit Number  3    Number of Visits  13    Date for PT Re-Evaluation  04/26/17    Authorization Type  3 / 10 G code     PT Start Time  5009    PT Stop Time  1430    PT Time Calculation (min)  45 min    Activity Tolerance  Patient tolerated treatment well    Behavior During Therapy  [facility] for tasks assessed/performed       Past Medical History:  Diagnosis Date  . Anemia    r/t uterine fibroids  . Atrial fib/flutter, transient    [facility] admission for RVR 03/2009  . DDD (degenerative disc disease)   . Dysrhythmia    A-FIB  . GERD (gastroesophageal reflux disease)   . H/O bladder infections   . H/O hiatal hernia   . H/O thyroid nodule    surgically removed  . Heart murmur    moderate MR/TR 08/2013 echo [name])  . Hemorrhoids   . Hiatal hernia with gastroesophageal reflux 2008   by EGD,  [name]  . High cholesterol   . History of uterine fibroid   . Hydronephrosis    uretero- with congenital UPJ obstruction- Dr. Edrick Oh  . Hydronephrosis with ureteropelvic junction obstruction   . Hypertension    [facility]  . Hypothyroidism   . Nocturia   . Osteoporosis   . Polyuria   . Recurrent UTI     Past Surgical History:  Procedure Laterality Date  . ADENOIDECTOMY N/A 01/05/2016   Procedure: ADENOIDECTOMY;  Surgeon: Margaretha Sheffield, MD;  Location: [facility] ORS;  Service: ENT;  Laterality: N/A;  . BACK SURGERY    . CATARACT EXTRACTION     left  . CERVICAL DISC SURGERY  1980's  . CHOLECYSTECTOMY    . NASOPHARYNGOSCOPY  01/05/2016   Procedure: NASOPHARYNGOSCOPY;  Surgeon: Margaretha Sheffield, MD;   Location: [facility] ORS;  Service: ENT;;  . THYROID LOBECTOMY     partial, right lobe  . TOTAL ABDOMINAL HYSTERECTOMY W/ BILATERAL SALPINGOOPHORECTOMY      There were no vitals filed for this visit.  Subjective Assessment - 03/21/17 1353    Subjective  Patient reports she's been performing exercises at home. Patient states she's been performing exercises at home.     Pertinent History  PMH: A- fib, History of LBP(several years does not give exact date), gallbladder removal, history of neck pain, L2-5 Decompression    Limitations  Lifting;Standing;Walking    Currently in Pain?  Yes    Pain Score  5     Pain Location  Back    Pain Orientation  Mid;Lower    Pain Descriptors / Indicators  Aching    Pain Type  Chronic pain    Pain Onset  More than a month ago    Pain Frequency  Intermittent       TREATMENT Therapeutic Exercise: Standing marches on airex pad - 2 x 15  Standing hip abduction - x 15 Monster walks with YTB around knees - x 52ft CKC Mackenzie extension with arms supported - x 20 required frequent cueing  Nustep level 3 - x 50min strength with foucs on speed Mini Squats with focus on improving hip hinging - 2 x 15 Hip extension with YTB around the knees - x15 Sit to stands with hip adduction and glute squeeze -- x 15    Patient demonstrates decreased pain at end of the session    PT Long Term Goals - 03/15/17 1218      PT [place] #1   Title  Patient will be independent with HEP to continue benefits of therapy after discharge.     Time  6    Period  Weeks    Status  New    Target Date  04/26/17      PT LONG TERM GOAL #2   Title  Patient will be able to perform all household chores without increase in pain to improve functional capacity.    Baseline  Requires a sitting rest break after 54mins of activity    Time  6    Period  Weeks    Status  New    Target Date  04/26/16      PT LONG TERM GOAL #3   Title  Patient will improve MODI to under 20% to improve  self perceived lumbar function and greater ability to perform social activities without increase in pain.     Baseline  58% MODI    Time  6    Period  Weeks    Status  New    Target Date  04/26/16            Plan - 03/21/17 1418    Clinical Impression Statement  Focused on performing more exercises in standing to address decreased motor control in standing. Patient requires frequent cueing on activating her anterior core musculature indicating poor coordination. Patient will benefit from further skilled therapy focused on improving limitations to return to prior level of function.     Rehab Potential  Good    Clinical Impairments Affecting Rehab Potential  (+) highly motivated; (-)     PT Frequency  2x / week    PT Duration  6 weeks    PT Treatment/Interventions  Passive range of motion;Manual techniques;Cryotherapy;Electrical Stimulation;Moist Heat;Ultrasound;Iontophoresis 4mg /ml Dexamethasone;Gait training;Therapeutic activities;Therapeutic exercise;Patient/family education;Neuromuscular re-education;Balance training;Dry needling    PT Next Visit Plan  Improve strength and endurance    PT Home Exercise Plan  See education    Consulted and Agree with Plan of Care  Patient       Patient will benefit from skilled therapeutic intervention in order to improve the following deficits and impairments:  Decreased activity tolerance, Decreased endurance, Decreased range of motion, Decreased strength, Difficulty walking, Decreased mobility, Increased muscle spasms, Postural dysfunction, Decreased coordination, Pain, Impaired sensation, Increased fascial restricitons  Visit Diagnosis: Chronic midline low back pain without sciatica  Muscle weakness (generalized)     Problem List Patient Active Problem List   Diagnosis Date Noted  . B12 deficiency 09/18/2016  . Right hip pain 08/05/2015  . Anemia 08/05/2015  . Vitamin D deficiency 08/05/2015  . Hemoptysis 08/04/2015  . Constipation  02/04/2015  . Left knee pain 01/05/2015  . Status post laminectomy with spinal fusion 02/23/2014  . Fatigue 07/07/2013  . Dyspnea on exertion 08/12/2012  . Chronic lower back pain 08/12/2012  . Urge urinary incontinence 08/12/2012  . Personal history of colonic polyps 08/12/2012  . Seasonal rhinitis 01/16/2012  . Hiatal hernia with gastroesophageal reflux   . Fatigue 09/16/2011  . Controlled atrial  fibrillation ([place]) 12/15/2010  . Irritable bowel syndrome without diarrhea 12/15/2010  . Hypothyroid 12/15/2010  . Long term current use of anticoagulant therapy 12/15/2010    Blythe Stanford, PT DPT 03/21/2017, 2:26 PM  [place] [facility] [address] [place], [address] Phone: [phone]   Fax:  [phone]  Name: KIMESHA CLAXTON MRN: [id number] Date of Birth: 1929-07-25

## 2017-03-23 ENCOUNTER — Ambulatory Visit: Payer: Medicare Other

## 2017-03-23 DIAGNOSIS — M6281 Muscle weakness (generalized): Secondary | ICD-10-CM

## 2017-03-23 DIAGNOSIS — M545 Low back pain: Principal | ICD-10-CM

## 2017-03-23 DIAGNOSIS — G8929 Other chronic pain: Secondary | ICD-10-CM

## 2017-03-23 NOTE — Therapy (Signed)
[place] [facility] [address] [place], [address] Phone: [phone]   Fax:  [phone]  Physical Therapy Treatment  Patient Details  Name: Alexandra Martinez MRN: [id number] Date of Birth: 1929-07-05 Referring Provider: Jovita Gamma MD   Encounter Date: 03/23/2017  PT End of Session - 03/23/17 1010    Visit Number  4    Number of Visits  13    Date for PT Re-Evaluation  04/26/17    Authorization Type  4 / 10 G code    PT Start Time  0945    PT Stop Time  1030    PT Time Calculation (min)  45 min    Activity Tolerance  Patient tolerated treatment well    Behavior During Therapy  [facility] for tasks assessed/performed       Past Medical History:  Diagnosis Date  . Anemia    r/t uterine fibroids  . Atrial fib/flutter, transient    [facility] admission for RVR 03/2009  . DDD (degenerative disc disease)   . Dysrhythmia    A-FIB  . GERD (gastroesophageal reflux disease)   . H/O bladder infections   . H/O hiatal hernia   . H/O thyroid nodule    surgically removed  . Heart murmur    moderate MR/TR 08/2013 echo [name])  . Hemorrhoids   . Hiatal hernia with gastroesophageal reflux 2008   by EGD,  [name]  . High cholesterol   . History of uterine fibroid   . Hydronephrosis    uretero- with congenital UPJ obstruction- Dr. Edrick Oh  . Hydronephrosis with ureteropelvic junction obstruction   . Hypertension    [facility]  . Hypothyroidism   . Nocturia   . Osteoporosis   . Polyuria   . Recurrent UTI     Past Surgical History:  Procedure Laterality Date  . ADENOIDECTOMY N/A 01/05/2016   Procedure: ADENOIDECTOMY;  Surgeon: Margaretha Sheffield, MD;  Location: [facility] ORS;  Service: ENT;  Laterality: N/A;  . BACK SURGERY    . CATARACT EXTRACTION     left  . CERVICAL DISC SURGERY  1980's  . CHOLECYSTECTOMY    . NASOPHARYNGOSCOPY  01/05/2016   Procedure: NASOPHARYNGOSCOPY;  Surgeon: Margaretha Sheffield, MD;  Location:  [facility] ORS;  Service: ENT;;  . THYROID LOBECTOMY     partial, right lobe  . TOTAL ABDOMINAL HYSTERECTOMY W/ BILATERAL SALPINGOOPHORECTOMY      There were no vitals filed for this visit.  Subjective Assessment - 03/23/17 1007    Subjective  Patient reports no major changes since the start of physical therapy. Patient reports the therapy has been helping her greatly.     Pertinent History  PMH: A- fib, History of LBP(several years does not give exact date), gallbladder removal, history of neck pain, L2-5 Decompression    Limitations  Lifting;Standing;Walking    Currently in Pain?  Yes    Pain Score  3     Pain Location  Hip    Pain Orientation  Mid;Lower    Pain Descriptors / Indicators  Aching    Pain Type  Chronic pain    Pain Onset  More than a month ago    Pain Frequency  Intermittent         TREATMENT Therapeutic Exercise: Mini Squats with focus on improving hip hinging -  x 15 Nustep level 4 - x 60min strength with focus on speed Hip abduction with hip machine - 25# 2 x 20  Side stepping with airex beam - x 5 down and back  Widened tandem stance - x20 weight shifts with heel and toe raises Sit to stands with hip adduction and glute squeeze -- x 20     Patient demonstrates decreased pain at end of the session   PT Education - 03/23/17 1009    Education provided  Yes    Education Details  Sit to stand with use of hands    Person(s) Educated  Patient    Methods  Explanation;Demonstration    Comprehension  Verbalized understanding;Returned demonstration          PT Long Term Goals - 03/15/17 1218      PT LONG TERM GOAL #1   Title  Patient will be independent with HEP to continue benefits of therapy after discharge.     Time  6    Period  Weeks    Status  New    Target Date  04/26/17      PT LONG TERM GOAL #2   Title  Patient will be able to perform all household chores without increase in pain to improve functional capacity.    Baseline  Requires a sitting rest  break after 64mins of activity    Time  6    Period  Weeks    Status  New    Target Date  04/26/16      PT LONG TERM GOAL #3   Title  Patient will improve MODI to under 20% to improve self perceived lumbar function and greater ability to perform social activities without increase in pain.     Baseline  58% MODI    Time  6    Period  Weeks    Status  New    Target Date  04/26/16            Plan - 03/23/17 1022    Clinical Impression Statement  Continued to focus on improving strength and balance in standing. Patient demonstrates early onset of fatigue with exercises indicating decreased muscular strength. Patient will benefit from further skilled therapy to return to prior level of function.     Rehab Potential  Good    Clinical Impairments Affecting Rehab Potential  (+) highly motivated; (-)     PT Frequency  2x / week    PT Duration  6 weeks    PT Treatment/Interventions  Passive range of motion;Manual techniques;Cryotherapy;Electrical Stimulation;Moist Heat;Ultrasound;Iontophoresis 4mg /ml Dexamethasone;Gait training;Therapeutic activities;Therapeutic exercise;Patient/family education;Neuromuscular re-education;Balance training;Dry needling    PT Next Visit Plan  Improve strength and endurance    PT Home Exercise Plan  See education    Consulted and Agree with Plan of Care  Patient       Patient will benefit from skilled therapeutic intervention in order to improve the following deficits and impairments:  Decreased activity tolerance, Decreased endurance, Decreased range of motion, Decreased strength, Difficulty walking, Decreased mobility, Increased muscle spasms, Postural dysfunction, Decreased coordination, Pain, Impaired sensation, Increased fascial restricitons  Visit Diagnosis: Chronic midline low back pain without sciatica  Muscle weakness (generalized)     Problem List Patient Active Problem List   Diagnosis Date Noted  . B12 deficiency 09/18/2016  . Right hip  pain 08/05/2015  . Anemia 08/05/2015  . Vitamin D deficiency 08/05/2015  . Hemoptysis 08/04/2015  . Constipation 02/04/2015  . Left knee pain 01/05/2015  . Status post laminectomy with spinal fusion 02/23/2014  . Fatigue 07/07/2013  . Dyspnea on exertion 08/12/2012  . Chronic lower  back pain 08/12/2012  . Urge urinary incontinence 08/12/2012  . Personal history of colonic polyps 08/12/2012  . Seasonal rhinitis 01/16/2012  . Hiatal hernia with gastroesophageal reflux   . Fatigue 09/16/2011  . Controlled atrial fibrillation ([place]) 12/15/2010  . Irritable bowel syndrome without diarrhea 12/15/2010  . Hypothyroid 12/15/2010  . Long term current use of anticoagulant therapy 12/15/2010    Blythe Stanford, PT DPT 03/23/2017, 10:29 AM  [place] [facility] [address] [place], [address] Phone: [phone]   Fax:  [phone]  Name: Alexandra Martinez MRN: [id number] Date of Birth: 23-Nov-1929

## 2017-03-25 DIAGNOSIS — Z86010 Personal history of colonic polyps: Secondary | ICD-10-CM | POA: Diagnosis not present

## 2017-03-25 DIAGNOSIS — Z1371 Encounter for nonprocreative screening for genetic disease carrier status: Secondary | ICD-10-CM | POA: Diagnosis not present

## 2017-03-28 ENCOUNTER — Ambulatory Visit: Payer: Medicare Other

## 2017-03-29 ENCOUNTER — Telehealth: Payer: Self-pay | Admitting: Internal Medicine

## 2017-03-29 NOTE — Telephone Encounter (Signed)
Copied from [place]. Topic: Quick Communication - See Telephone Encounter >> Mar 29, 2017  1:23 PM Boyd Kerbs wrote: CRM for notification. See Telephone encounter for:  Prescription refill for Pravastatin   [facility] [place], [place] [facility] [place] 03/29/17.

## 2017-03-30 ENCOUNTER — Ambulatory Visit: Payer: Medicare Other

## 2017-03-30 MED ORDER — PRAVASTATIN SODIUM 20 MG PO TABS: ORAL_TABLET | ORAL | 2 refills | Status: DC

## 2017-04-04 ENCOUNTER — Ambulatory Visit: Payer: Medicare Other

## 2017-04-04 DIAGNOSIS — G8929 Other chronic pain: Secondary | ICD-10-CM

## 2017-04-04 DIAGNOSIS — M6281 Muscle weakness (generalized): Secondary | ICD-10-CM | POA: Diagnosis not present

## 2017-04-04 DIAGNOSIS — M545 Low back pain, unspecified: Secondary | ICD-10-CM

## 2017-04-04 NOTE — Therapy (Signed)
[place] [facility] [address] [place], [address] Phone: [phone]   Fax:  [phone]  Physical Therapy Treatment  Patient Details  Name: Alexandra Martinez MRN: [id number] Date of Birth: Jan 16, 1930 Referring Provider: Jovita Gamma MD   Encounter Date: 04/04/2017  PT End of Session - 04/04/17 1201    Visit Number  5    Number of Visits  13    Date for PT Re-Evaluation  04/26/17    Authorization Type  5 / 10 G code    PT Start Time  1116    PT Stop Time  1203    PT Time Calculation (min)  47 min    Activity Tolerance  Patient tolerated treatment well    Behavior During Therapy  [facility] for tasks assessed/performed       Past Medical History:  Diagnosis Date  . Anemia    r/t uterine fibroids  . Atrial fib/flutter, transient    [facility] admission for RVR 03/2009  . DDD (degenerative disc disease)   . Dysrhythmia    A-FIB  . GERD (gastroesophageal reflux disease)   . H/O bladder infections   . H/O hiatal hernia   . H/O thyroid nodule    surgically removed  . Heart murmur    moderate MR/TR 08/2013 echo [name])  . Hemorrhoids   . Hiatal hernia with gastroesophageal reflux 2008   by EGD,  [name]  . High cholesterol   . History of uterine fibroid   . Hydronephrosis    uretero- with congenital UPJ obstruction- Dr. Edrick Oh  . Hydronephrosis with ureteropelvic junction obstruction   . Hypertension    [facility]  . Hypothyroidism   . Nocturia   . Osteoporosis   . Polyuria   . Recurrent UTI     Past Surgical History:  Procedure Laterality Date  . ADENOIDECTOMY N/A 01/05/2016   Procedure: ADENOIDECTOMY;  Surgeon: Margaretha Sheffield, MD;  Location: [facility] ORS;  Service: ENT;  Laterality: N/A;  . BACK SURGERY    . CATARACT EXTRACTION     left  . CERVICAL DISC SURGERY  1980's  . CHOLECYSTECTOMY    . NASOPHARYNGOSCOPY  01/05/2016   Procedure: NASOPHARYNGOSCOPY;  Surgeon: Margaretha Sheffield, MD;   Location: [facility] ORS;  Service: ENT;;  . THYROID LOBECTOMY     partial, right lobe  . TOTAL ABDOMINAL HYSTERECTOMY W/ BILATERAL SALPINGOOPHORECTOMY      There were no vitals filed for this visit.  Subjective Assessment - 04/04/17 1159    Subjective  Patient reports she's had back pain has been increased the past week and states she's had to use her abdominal brace.    Pertinent History  PMH: A- fib, History of LBP(several years does not give exact date), gallbladder removal, history of neck pain, L2-5 Decompression    Limitations  Lifting;Standing;Walking    Currently in Pain?  Yes    Pain Score  7     Pain Location  Back    Pain Orientation  Mid    Pain Descriptors / Indicators  Aching    Pain Type  Chronic pain    Pain Onset  More than a month ago    Pain Frequency  Intermittent       TREATMENT: Therapeutic Exercise: Knees to chest with physioball - x 20 LTR with feet on ground - x 20  Sit to stands without use of UEs - x 20  Dead bug with assistance - x20  Bridges with abduction - x 20  Bridges with adduction with use of a ball - x 20  Pelvic tilts ant/post in supine - x 20 5sec LTRS on physioball - x 20  TRX sit to stands - x 20   Patient demonstrates improvement in pain at end of the session   PT Education - 04/04/17 1201    Education provided  Yes    Education Details  HEP: reviewed bridges    Person(s) Educated  Patient    Methods  Explanation;Demonstration    Comprehension  Returned demonstration;Verbalized understanding          PT Long Term Goals - 03/15/17 1218      PT LONG TERM GOAL #1   Title  Patient will be independent with HEP to continue benefits of therapy after discharge.     Time  6    Period  Weeks    Status  New    Target Date  04/26/17      PT LONG TERM GOAL #2   Title  Patient will be able to perform all household chores without increase in pain to improve functional capacity.    Baseline  Requires a sitting rest break after 74mins of  activity    Time  6    Period  Weeks    Status  New    Target Date  04/26/16      PT LONG TERM GOAL #3   Title  Patient will improve MODI to under 20% to improve self perceived lumbar function and greater ability to perform social activities without increase in pain.     Baseline  58% MODI    Time  6    Period  Weeks    Status  New    Target Date  04/26/16            Plan - 04/04/17 1202    Clinical Impression Statement  Patient demonstrates improvement in pain after performing bridges and exercises in hooklying indicating improvement in motor control and coordination. Focused on education on performance of HEP to further decrease symptoms; patient will benefit from further skilled therapy to return to prior level of function.     Rehab Potential  Good    Clinical Impairments Affecting Rehab Potential  (+) highly motivated; (-)     PT Frequency  2x / week    PT Duration  6 weeks    PT Treatment/Interventions  Passive range of motion;Manual techniques;Cryotherapy;Electrical Stimulation;Moist Heat;Ultrasound;Iontophoresis 4mg /ml Dexamethasone;Gait training;Therapeutic activities;Therapeutic exercise;Patient/family education;Neuromuscular re-education;Balance training;Dry needling    PT Next Visit Plan  Improve strength and endurance    PT Home Exercise Plan  See education    Consulted and Agree with Plan of Care  Patient       Patient will benefit from skilled therapeutic intervention in order to improve the following deficits and impairments:  Decreased activity tolerance, Decreased endurance, Decreased range of motion, Decreased strength, Difficulty walking, Decreased mobility, Increased muscle spasms, Postural dysfunction, Decreased coordination, Pain, Impaired sensation, Increased fascial restricitons  Visit Diagnosis: Muscle weakness (generalized)  Chronic midline low back pain without sciatica     Problem List Patient Active Problem List   Diagnosis Date Noted  .  B12 deficiency 09/18/2016  . Right hip pain 08/05/2015  . Anemia 08/05/2015  . Vitamin D deficiency 08/05/2015  . Hemoptysis 08/04/2015  . Constipation 02/04/2015  . Left knee pain 01/05/2015  . Status post laminectomy with spinal fusion 02/23/2014  . Fatigue 07/07/2013  .  Dyspnea on exertion 08/12/2012  . Chronic lower back pain 08/12/2012  . Urge urinary incontinence 08/12/2012  . Personal history of colonic polyps 08/12/2012  . Seasonal rhinitis 01/16/2012  . Hiatal hernia with gastroesophageal reflux   . Fatigue 09/16/2011  . Controlled atrial fibrillation ([place]) 12/15/2010  . Irritable bowel syndrome without diarrhea 12/15/2010  . Hypothyroid 12/15/2010  . Long term current use of anticoagulant therapy 12/15/2010    Blythe Stanford, PT DPT 04/04/2017, 12:29 PM  [place] [facility] [address] [place], [address] Phone: [phone]   Fax:  [phone]  Name: Alexandra [place] MRN: [id number] Date of Birth: [birth year]

## 2017-04-06 ENCOUNTER — Ambulatory Visit: Payer: Medicare Other

## 2017-04-06 DIAGNOSIS — M6281 Muscle weakness (generalized): Secondary | ICD-10-CM

## 2017-04-06 DIAGNOSIS — M545 Low back pain: Principal | ICD-10-CM

## 2017-04-06 DIAGNOSIS — G8929 Other chronic pain: Secondary | ICD-10-CM

## 2017-04-06 NOTE — Therapy (Signed)
[facility] [address] [place], [address] Phone: [phone]   Fax:  [phone]  Physical Therapy Treatment  Patient Details  Name: Alexandra Martinez MRN: [id number] Date of Birth: Oct 26, 1929 Referring Provider: Jovita Gamma MD   Encounter Date: 04/06/2017  PT End of Session - 04/06/17 1137    Visit Number  6    Number of Visits  13    Date for PT Re-Evaluation  04/26/17    Authorization Type  6 / 10 G code    PT Start Time  1120    PT Stop Time  1204    PT Time Calculation (min)  44 min    Activity Tolerance  Patient tolerated treatment well    Behavior During Therapy  [facility] for tasks assessed/performed       Past Medical History:  Diagnosis Date  . Anemia    r/t uterine fibroids  . Atrial fib/flutter, transient    [facility] admission for RVR 03/2009  . DDD (degenerative disc disease)   . Dysrhythmia    A-FIB  . GERD (gastroesophageal reflux disease)   . H/O bladder infections   . H/O hiatal hernia   . H/O thyroid nodule    surgically removed  . Heart murmur    moderate MR/TR 08/2013 echo [name])  . Hemorrhoids   . Hiatal hernia with gastroesophageal reflux 2008   by EGD,  [name]  . High cholesterol   . History of uterine fibroid   . Hydronephrosis    uretero- with congenital UPJ obstruction- Dr. Edrick Oh  . Hydronephrosis with ureteropelvic junction obstruction   . Hypertension    [facility]  . Hypothyroidism   . Nocturia   . Osteoporosis   . Polyuria   . Recurrent UTI     Past Surgical History:  Procedure Laterality Date  . ADENOIDECTOMY N/A 01/05/2016   Procedure: ADENOIDECTOMY;  Surgeon: Margaretha Sheffield, MD;  Location: [facility] ORS;  Service: ENT;  Laterality: N/A;  . BACK SURGERY    . CATARACT EXTRACTION     left  . CERVICAL DISC SURGERY  1980's  . CHOLECYSTECTOMY    . NASOPHARYNGOSCOPY  01/05/2016   Procedure: NASOPHARYNGOSCOPY;  Surgeon: Margaretha Sheffield, MD;   Location: [facility] ORS;  Service: ENT;;  . THYROID LOBECTOMY     partial, right lobe  . TOTAL ABDOMINAL HYSTERECTOMY W/ BILATERAL SALPINGOOPHORECTOMY      There were no vitals filed for this visit.  Subjective Assessment - 04/06/17 1133    Subjective  Patient reports she feels she has been improving since the start of physical therapy. Patient reports she continues to use her abdominal brace for heavy house hold chores.     Pertinent History  PMH: A- fib, History of LBP(several years does not give exact date), gallbladder removal, history of neck pain, L2-5 Decompression    Limitations  Lifting;Standing;Walking    Currently in Pain?  Yes    Pain Score  4     Pain Location  Back    Pain Orientation  Mid    Pain Descriptors / Indicators  Aching    Pain Type  Chronic pain    Pain Onset  More than a month ago    Pain Frequency  Intermittent       TREATMENT: Therapeutic Exercise: Bridges in hooklying - x 20 Dead bug with assistance with maintaining hip flexion on contralateral side- x20  Bridges with adduction with use of a ball -  x 20  LTRS with ball I nbetween knees --  x 20  Bridges with abduction - x 20 with BTB; against manual resistance x15 Sit to stands without use of UEs while holding 1kg ball at shoulder height arms straight at 90deg - x 20     Patient demonstrates improvement in pain at end of the session  PT Education - 04/06/17 1136    Education provided  Yes    Education Details  HEP: Reviewed Dead bug exercise    Person(s) Educated  Patient    Methods  Explanation;Demonstration    Comprehension  Verbalized understanding;Returned demonstration          PT Long Term Goals - 03/15/17 1218      PT LONG TERM GOAL #1   Title  Patient will be independent with HEP to continue benefits of therapy after discharge.     Time  6    Period  Weeks    Status  New    Target Date  04/26/17      PT LONG TERM GOAL #2   Title  Patient will be able to perform all household  chores without increase in pain to improve functional capacity.    Baseline  Requires a sitting rest break after 3mins of activity    Time  6    Period  Weeks    Status  New    Target Date  04/26/16      PT LONG TERM GOAL #3   Title  Patient will improve MODI to under 20% to improve self perceived lumbar function and greater ability to perform social activities without increase in pain.     Baseline  58% MODI    Time  6    Period  Weeks    Status  New    Target Date  04/26/16            Plan - 04/06/17 1253    Clinical Impression Statement  Patient demonstrates improvemenet in pain and symptoms after performing therapeutic exercisese focused on improving core stabilization and strengthening. Although patient is improving, she continues to have increased pain with the performance of standing exercises and reaching outside of BOS in standing. Patient will benefit from further skilled therapy to return to prior level of function.     Rehab Potential  Good    Clinical Impairments Affecting Rehab Potential  (+) highly motivated; (-)     PT Frequency  2x / week    PT Duration  6 weeks    PT Treatment/Interventions  Passive range of motion;Manual techniques;Cryotherapy;Electrical Stimulation;Moist Heat;Ultrasound;Iontophoresis 4mg /ml Dexamethasone;Gait training;Therapeutic activities;Therapeutic exercise;Patient/family education;Neuromuscular re-education;Balance training;Dry needling    PT Next Visit Plan  Improve strength and endurance    PT Home Exercise Plan  See education    Consulted and Agree with Plan of Care  Patient       Patient will benefit from skilled therapeutic intervention in order to improve the following deficits and impairments:  Decreased activity tolerance, Decreased endurance, Decreased range of motion, Decreased strength, Difficulty walking, Decreased mobility, Increased muscle spasms, Postural dysfunction, Decreased coordination, Pain, Impaired sensation,  Increased fascial restricitons  Visit Diagnosis: Chronic midline low back pain without sciatica  Muscle weakness (generalized)     Problem List Patient Active Problem List   Diagnosis Date Noted  . B12 deficiency 09/18/2016  . Right hip pain 08/05/2015  . Anemia 08/05/2015  . Vitamin D deficiency 08/05/2015  . Hemoptysis 08/04/2015  . Constipation 02/04/2015  .  Left knee pain 01/05/2015  . Status post laminectomy with spinal fusion 02/23/2014  . Fatigue 07/07/2013  . Dyspnea on exertion 08/12/2012  . Chronic lower back pain 08/12/2012  . Urge urinary incontinence 08/12/2012  . Personal history of colonic polyps 08/12/2012  . Seasonal rhinitis 01/16/2012  . Hiatal hernia with gastroesophageal reflux   . Fatigue 09/16/2011  . Controlled atrial fibrillation ([place]) 12/15/2010  . Irritable bowel syndrome without diarrhea 12/15/2010  . Hypothyroid 12/15/2010  . Long term current use of anticoagulant therapy 12/15/2010    Blythe Stanford, PT DPT 04/06/2017, 1:02 PM  [facility] [address] [place], [address] Phone: [phone]   Fax:  [phone]  Name: GAELYN TUKES MRN: [id number] Date of Birth: July 07, 1929

## 2017-04-20 ENCOUNTER — Ambulatory Visit: Payer: Medicare Other | Attending: Neurosurgery

## 2017-04-20 ENCOUNTER — Telehealth: Payer: Self-pay | Admitting: Internal Medicine

## 2017-04-20 DIAGNOSIS — M545 Low back pain, unspecified: Secondary | ICD-10-CM

## 2017-04-20 DIAGNOSIS — M6281 Muscle weakness (generalized): Secondary | ICD-10-CM | POA: Diagnosis not present

## 2017-04-20 DIAGNOSIS — G8929 Other chronic pain: Secondary | ICD-10-CM | POA: Diagnosis not present

## 2017-04-20 NOTE — Telephone Encounter (Signed)
See pharmacy request to switch brand name of Levothyroxine /

## 2017-04-20 NOTE — Telephone Encounter (Signed)
Copied from [place]. Topic: Quick Communication - See Telephone Encounter >> Apr 20, 2017  4:07 PM Boyd Kerbs wrote: CRM for notification. See Telephone encounter for:   Alexandra Martinez from [facility] called wanting to see if can switch Levothyroxine  from [place] to [place] 04/20/17.

## 2017-04-20 NOTE — Therapy (Signed)
[facility] [address] [place], [address] Phone: [phone]   Fax:  [phone]  Physical Therapy Treatment  Patient Details  Name: Alexandra Martinez MRN: [id number] Date of Birth: 1929/05/12 Referring Provider: Jovita Gamma MD   Encounter Date: 04/20/2017  PT End of Session - 04/20/17 1703    Visit Number  7    Number of Visits  13    Date for PT Re-Evaluation  04/26/17    Authorization Type  7 / 10 G code    PT Start Time  1645    PT Stop Time  6599    PT Time Calculation (min)  30 min    Activity Tolerance  Patient tolerated treatment well    Behavior During Therapy  [facility] for tasks assessed/performed       Past Medical History:  Diagnosis Date  . Anemia    r/t uterine fibroids  . Atrial fib/flutter, transient    [facility] admission for RVR 03/2009  . DDD (degenerative disc disease)   . Dysrhythmia    A-FIB  . GERD (gastroesophageal reflux disease)   . H/O bladder infections   . H/O hiatal hernia   . H/O thyroid nodule    surgically removed  . Heart murmur    moderate MR/TR 08/2013 echo [name])  . Hemorrhoids   . Hiatal hernia with gastroesophageal reflux 2008   by EGD,  [name]  . High cholesterol   . History of uterine fibroid   . Hydronephrosis    uretero- with congenital UPJ obstruction- Dr. Edrick Oh  . Hydronephrosis with ureteropelvic junction obstruction   . Hypertension    [facility]  . Hypothyroidism   . Nocturia   . Osteoporosis   . Polyuria   . Recurrent UTI     Past Surgical History:  Procedure Laterality Date  . ADENOIDECTOMY N/A 01/05/2016   Procedure: ADENOIDECTOMY;  Surgeon: Margaretha Sheffield, MD;  Location: [facility] ORS;  Service: ENT;  Laterality: N/A;  . BACK SURGERY    . CATARACT EXTRACTION     left  . CERVICAL DISC SURGERY  1980's  . CHOLECYSTECTOMY    . NASOPHARYNGOSCOPY  01/05/2016   Procedure: NASOPHARYNGOSCOPY;  Surgeon: Margaretha Sheffield, MD;  Location:  [facility] ORS;  Service: ENT;;  . THYROID LOBECTOMY     partial, right lobe  . TOTAL ABDOMINAL HYSTERECTOMY W/ BILATERAL SALPINGOOPHORECTOMY      There were no vitals filed for this visit.  Subjective Assessment - 04/20/17 1656    Subjective  Patient reports she continues to improve but states she continues to require to use her lumbar brace.     Pertinent History  PMH: A- fib, History of LBP(several years does not give exact date), gallbladder removal, history of neck pain, L2-5 Decompression    Limitations  Lifting;Standing;Walking    Currently in Pain?  Yes    Pain Score  4     Pain Location  Back    Pain Orientation  Lower    Pain Descriptors / Indicators  Aching    Pain Type  Chronic pain    Pain Onset  More than a month ago    Pain Frequency  Intermittent        TREATMENT: Therapeutic Exercise: Bridges in hooklying - x 20 Dead bug in hooklying- x20  Bridges with adduction with use of a ball - x 20  LTRS in hooklying --  x 20  Bridges on a ball -  x20  Bridges with abduction - x 20 with BTB; against manual resistance x15 Sit to stands without use of UEs while holding 1kg ball at shoulder height arms at 90deg -2 x 15     Patient demonstrates improvement in pain at end of the session   PT Education - 04/20/17 1659    Education provided  Yes    Education Details  form/technique with exercise    Person(s) Educated  Patient    Methods  Explanation;Demonstration    Comprehension  Verbalized understanding;Returned demonstration          PT Long Term Goals - 03/15/17 1218      PT LONG TERM GOAL #1   Title  Patient will be independent with HEP to continue benefits of therapy after discharge.     Time  6    Period  Weeks    Status  New    Target Date  04/26/17      PT LONG TERM GOAL #2   Title  Patient will be able to perform all household chores without increase in pain to improve functional capacity.    Baseline  Requires a sitting rest break after 24mins of  activity    Time  6    Period  Weeks    Status  New    Target Date  04/26/16      PT LONG TERM GOAL #3   Title  Patient will improve MODI to under 20% to improve self perceived lumbar function and greater ability to perform social activities without increase in pain.     Baseline  58% MODI    Time  6    Period  Weeks    Status  New    Target Date  04/26/16            Plan - 04/20/17 1706    Clinical Impression Statement  Patient demonstrates improvement in pain and symptoms after performing exercises indicating improved bracing ability and core stabilization. Focused on educating patient to continue exercise performance at home and patient will benefit from further skilled therapy to return to prior level of function.     Rehab Potential  Good    Clinical Impairments Affecting Rehab Potential  (+) highly motivated; (-)     PT Frequency  2x / week    PT Duration  6 weeks    PT Treatment/Interventions  Passive range of motion;Manual techniques;Cryotherapy;Electrical Stimulation;Moist Heat;Ultrasound;Iontophoresis 4mg /ml Dexamethasone;Gait training;Therapeutic activities;Therapeutic exercise;Patient/family education;Neuromuscular re-education;Balance training;Dry needling    PT Next Visit Plan  Improve strength and endurance    PT Home Exercise Plan  See education    Consulted and Agree with Plan of Care  Patient       Patient will benefit from skilled therapeutic intervention in order to improve the following deficits and impairments:  Decreased activity tolerance, Decreased endurance, Decreased range of motion, Decreased strength, Difficulty walking, Decreased mobility, Increased muscle spasms, Postural dysfunction, Decreased coordination, Pain, Impaired sensation, Increased fascial restricitons  Visit Diagnosis: Chronic midline low back pain without sciatica  Muscle weakness (generalized)     Problem List Patient Active Problem List   Diagnosis Date Noted  . B12  deficiency 09/18/2016  . Right hip pain 08/05/2015  . Anemia 08/05/2015  . Vitamin D deficiency 08/05/2015  . Hemoptysis 08/04/2015  . Constipation 02/04/2015  . Left knee pain 01/05/2015  . Status post laminectomy with spinal fusion 02/23/2014  . Fatigue 07/07/2013  . Dyspnea on exertion 08/12/2012  . Chronic  lower back pain 08/12/2012  . Urge urinary incontinence 08/12/2012  . Personal history of colonic polyps 08/12/2012  . Seasonal rhinitis 01/16/2012  . Hiatal hernia with gastroesophageal reflux   . Fatigue 09/16/2011  . Controlled atrial fibrillation ([place]) 12/15/2010  . Irritable bowel syndrome without diarrhea 12/15/2010  . Hypothyroid 12/15/2010  . Long term current use of anticoagulant therapy 12/15/2010    Blythe Stanford, PT DPT 04/20/2017, 5:11 PM  [facility] [address], [place], [address] Phone: [phone]   Fax:  [phone]  Name: ILINE BUCHINGER MRN: [id number] Date of Birth: [birth year]

## 2017-04-21 NOTE — Telephone Encounter (Signed)
Yes, ok to change

## 2017-04-21 NOTE — Telephone Encounter (Signed)
Gave the okay to the pharmacy to change the brand of levothyroxine for the pt.

## 2017-04-26 ENCOUNTER — Ambulatory Visit: Payer: Medicare Other

## 2017-04-26 DIAGNOSIS — M545 Low back pain: Secondary | ICD-10-CM | POA: Diagnosis not present

## 2017-04-26 DIAGNOSIS — M6281 Muscle weakness (generalized): Secondary | ICD-10-CM | POA: Diagnosis not present

## 2017-04-26 DIAGNOSIS — G8929 Other chronic pain: Secondary | ICD-10-CM

## 2017-04-26 NOTE — Therapy (Signed)
[facility] [address], [place], [address] Phone: [phone]   Fax:  [phone]  Physical Therapy Treatment  Patient Details  Name: Alexandra Martinez MRN: [id number] Date of Birth: [birth year] Referring Provider: Jovita Gamma MD   Encounter Date: 04/26/2017  PT End of Session - 04/26/17 1103    Visit Number  8    Number of Visits  13    Date for PT Re-Evaluation  04/26/17    PT Start Time  1030    PT Stop Time  1115    PT Time Calculation (min)  45 min    Activity Tolerance  Patient tolerated treatment well    Behavior During Therapy  [facility] for tasks assessed/performed       Past Medical History:  Diagnosis Date  . Anemia    r/t uterine fibroids  . Atrial fib/flutter, transient    [facility] admission for RVR 03/2009  . DDD (degenerative disc disease)   . Dysrhythmia    A-FIB  . GERD (gastroesophageal reflux disease)   . H/O bladder infections   . H/O hiatal hernia   . H/O thyroid nodule    surgically removed  . Heart murmur    moderate MR/TR 08/2013 echo [name])  . Hemorrhoids   . Hiatal hernia with gastroesophageal reflux 2008   by EGD,  [name]  . High cholesterol   . History of uterine fibroid   . Hydronephrosis    uretero- with congenital UPJ obstruction- Dr. Edrick Oh  . Hydronephrosis with ureteropelvic junction obstruction   . Hypertension    [facility]  . Hypothyroidism   . Nocturia   . Osteoporosis   . Polyuria   . Recurrent UTI     Past Surgical History:  Procedure Laterality Date  . ADENOIDECTOMY N/A 01/05/2016   Procedure: ADENOIDECTOMY;  Surgeon: Margaretha Sheffield, MD;  Location: [facility] ORS;  Service: ENT;  Laterality: N/A;  . BACK SURGERY    . CATARACT EXTRACTION     left  . CERVICAL DISC SURGERY  1980's  . CHOLECYSTECTOMY    . NASOPHARYNGOSCOPY  01/05/2016   Procedure: NASOPHARYNGOSCOPY;  Surgeon: Margaretha Sheffield, MD;  Location: [facility] ORS;  Service: ENT;;  . THYROID  LOBECTOMY     partial, right lobe  . TOTAL ABDOMINAL HYSTERECTOMY W/ BILATERAL SALPINGOOPHORECTOMY      There were no vitals filed for this visit.  Subjective Assessment - 04/26/17 1052    Subjective  Patient reports improvement in pain today and states the pain has not been "as bad".     Pertinent History  PMH: A- fib, History of LBP(several years does not give exact date), gallbladder removal, history of neck pain, L2-5 Decompression    Limitations  Lifting;Standing;Walking    Currently in Pain?  Yes    Pain Score  2     Pain Location  Back    Pain Orientation  Lower    Pain Descriptors / Indicators  Aching    Pain Onset  More than a month ago        TREATMENT: Therapeutic Exercise: Bridges in hooklying with hip abduction - x 20 Dead bug in hooklying- x20  LTRS in hooklying --  x 20  Sit to stands without use of UEs while holding 1kg ball at shoulder height arms at 90deg - 2 x 15 Standing hip extension with UE support - x20 with cueing  Standing hip abduction with UE support - 2 x 20  with cueing on body positioning Heel raises in standing with UE support - x 30 with cueing on gluteal activation Seated multifidus crunch - x 20   Patient demonstrates increased fatigue at the end of the session    PT Education - 04/26/17 1059    Education provided  Yes    Education Details  bridges and LTR in hooklying added to HEP     Person(s) Educated  Patient    Methods  Demonstration;Explanation    Comprehension  Verbalized understanding;Returned demonstration          PT Long Term Goals - 03/15/17 1218      PT LONG TERM GOAL #1   Title  Patient will be independent with HEP to continue benefits of therapy after discharge.     Time  6    Period  Weeks    Status  New    Target Date  04/26/17      PT LONG TERM GOAL #2   Title  Patient will be able to perform all household chores without increase in pain to improve functional capacity.    Baseline  Requires a sitting rest  break after 63mins of activity    Time  6    Period  Weeks    Status  New    Target Date  04/26/16      PT LONG TERM GOAL #3   Title  Patient will improve MODI to under 20% to improve self perceived lumbar function and greater ability to perform social activities without increase in pain.     Baseline  58% MODI    Time  6    Period  Weeks    Status  New    Target Date  04/26/16            Plan - 04/26/17 1120    Clinical Impression Statement  Performed greater amounter of standing exercises to improve lumbar stabilization today. Patient demonstrates poor motor control requiring frequent cueing on body positioning during exercise performance. Patientce will benefit from further skilled therapy to return to prior level of function.     Rehab Potential  Good    Clinical Impairments Affecting Rehab Potential  (+) highly motivated; (-)     PT Frequency  2x / week    PT Duration  6 weeks    PT Treatment/Interventions  Passive range of motion;Manual techniques;Cryotherapy;Electrical Stimulation;Moist Heat;Ultrasound;Iontophoresis 4mg /ml Dexamethasone;Gait training;Therapeutic activities;Therapeutic exercise;Patient/family education;Neuromuscular re-education;Balance training;Dry needling    PT Next Visit Plan  Improve strength and endurance    PT Home Exercise Plan  See education    Consulted and Agree with Plan of Care  Patient       Patient will benefit from skilled therapeutic intervention in order to improve the following deficits and impairments:  Decreased activity tolerance, Decreased endurance, Decreased range of motion, Decreased strength, Difficulty walking, Decreased mobility, Increased muscle spasms, Postural dysfunction, Decreased coordination, Pain, Impaired sensation, Increased fascial restricitons  Visit Diagnosis: Chronic midline low back pain without sciatica  Muscle weakness (generalized)     Problem List Patient Active Problem List   Diagnosis Date Noted  .  B12 deficiency 09/18/2016  . Right hip pain 08/05/2015  . Anemia 08/05/2015  . Vitamin D deficiency 08/05/2015  . Hemoptysis 08/04/2015  . Constipation 02/04/2015  . Left knee pain 01/05/2015  . Status post laminectomy with spinal fusion 02/23/2014  . Fatigue 07/07/2013  . Dyspnea on exertion 08/12/2012  . Chronic lower back pain 08/12/2012  .  Urge urinary incontinence 08/12/2012  . Personal history of colonic polyps 08/12/2012  . Seasonal rhinitis 01/16/2012  . Hiatal hernia with gastroesophageal reflux   . Fatigue 09/16/2011  . Controlled atrial fibrillation ([place]) 12/15/2010  . Irritable bowel syndrome without diarrhea 12/15/2010  . Hypothyroid 12/15/2010  . Long term current use of anticoagulant therapy 12/15/2010    Blythe Stanford, PT DPT 04/26/2017, 11:31 AM  [facility] [address] [place], [address] Phone: [phone]   Fax:  [phone]  Name: Alexandra Martinez MRN: [id number] Date of Birth: 09/07/1929

## 2017-04-27 ENCOUNTER — Ambulatory Visit: Payer: Medicare Other

## 2017-04-27 DIAGNOSIS — N183 Chronic kidney disease, stage 3 (moderate): Secondary | ICD-10-CM | POA: Diagnosis not present

## 2017-04-27 DIAGNOSIS — N2581 Secondary hyperparathyroidism of renal origin: Secondary | ICD-10-CM | POA: Diagnosis not present

## 2017-04-27 DIAGNOSIS — I10 Essential (primary) hypertension: Secondary | ICD-10-CM | POA: Diagnosis not present

## 2017-04-27 DIAGNOSIS — R600 Localized edema: Secondary | ICD-10-CM | POA: Diagnosis not present

## 2017-05-03 ENCOUNTER — Ambulatory Visit: Payer: Medicare Other

## 2017-05-03 DIAGNOSIS — G8929 Other chronic pain: Secondary | ICD-10-CM | POA: Diagnosis not present

## 2017-05-03 DIAGNOSIS — M6281 Muscle weakness (generalized): Secondary | ICD-10-CM

## 2017-05-03 DIAGNOSIS — M545 Low back pain: Secondary | ICD-10-CM | POA: Diagnosis not present

## 2017-05-03 NOTE — Therapy (Signed)
[facility] [address] [place], [address] Phone: [phone]   Fax:  [phone]  Physical Therapy Treatment  Patient Details  Name: Alexandra Martinez MRN: [id number] Date of Birth: 07/01/1929 Referring Provider: Jovita Gamma MD   Encounter Date: 05/03/2017  PT End of Session - 05/03/17 1440    Visit Number  9    Number of Visits  13    Date for PT Re-Evaluation  04/26/17    PT Start Time  2992    PT Stop Time  1430    PT Time Calculation (min)  45 min    Activity Tolerance  Patient tolerated treatment well    Behavior During Therapy  [facility] for tasks assessed/performed       Past Medical History:  Diagnosis Date  . Anemia    r/t uterine fibroids  . Atrial fib/flutter, transient    [facility] admission for RVR 03/2009  . DDD (degenerative disc disease)   . Dysrhythmia    A-FIB  . GERD (gastroesophageal reflux disease)   . H/O bladder infections   . H/O hiatal hernia   . H/O thyroid nodule    surgically removed  . Heart murmur    moderate MR/TR 08/2013 echo [name])  . Hemorrhoids   . Hiatal hernia with gastroesophageal reflux 2008   by EGD,  [name]  . High cholesterol   . History of uterine fibroid   . Hydronephrosis    uretero- with congenital UPJ obstruction- Dr. Edrick Oh  . Hydronephrosis with ureteropelvic junction obstruction   . Hypertension    [facility]  . Hypothyroidism   . Nocturia   . Osteoporosis   . Polyuria   . Recurrent UTI     Past Surgical History:  Procedure Laterality Date  . ADENOIDECTOMY N/A 01/05/2016   Procedure: ADENOIDECTOMY;  Surgeon: Margaretha Sheffield, MD;  Location: [facility] ORS;  Service: ENT;  Laterality: N/A;  . BACK SURGERY    . CATARACT EXTRACTION     left  . CERVICAL DISC SURGERY  1980's  . CHOLECYSTECTOMY    . NASOPHARYNGOSCOPY  01/05/2016   Procedure: NASOPHARYNGOSCOPY;  Surgeon: Margaretha Sheffield, MD;  Location: [facility] ORS;  Service: ENT;;  . THYROID  LOBECTOMY     partial, right lobe  . TOTAL ABDOMINAL HYSTERECTOMY W/ BILATERAL SALPINGOOPHORECTOMY      There were no vitals filed for this visit.  Subjective Assessment - 05/03/17 1350    Subjective  Patient states her pain is improving and reports pain is currently 2/10. Patient reports she has not been doing her HEP since previous session. Patient reports she is getting better with physical therapy.     Pertinent History  PMH: A- fib, History of LBP(several years does not give exact date), gallbladder removal, history of neck pain, L2-5 Decompression    Limitations  Lifting;Standing;Walking    Currently in Pain?  Yes    Pain Score  2     Pain Location  Back    Pain Orientation  Lower    Pain Descriptors / Indicators  Aching    Pain Type  Chronic pain    Pain Onset  More than a month ago    Pain Frequency  Intermittent        Therapeutic Exercise:   Seated glute squeeze with ball -- 1x20 to increase glute strength and motor control Standing glute squeeze - 1x12 performed to improve motor control and increase activation of glute muscles; required  extensive verbal and tactile cueing. Standing hip extensions -- 1x12 B to increase LE strength and endurance Glute bridge in hooklying -1x20 to increase glute strength and activation; required extensive verbal cueing  Glute bridge in hooklying with ball - 1x20 to increase glute strength and activation Lower trunk rotations - 1x30 performed to decrease LBP Standing hip abduction -- 1x20 B to improve motor control Sit-to-stands -- 2x10 to improve LE strength and coordination bilaterally   MODI: 38%  Patient demonstrates increased fatigue at end of session.      PT Education - 05/03/17 1439    Education provided  Yes    Education Details  Form/technique with exercise    Person(s) Educated  Patient    Methods  Explanation;Demonstration;Verbal cues    Comprehension  Verbalized understanding;Returned demonstration           PT Long Term Goals - 05/03/17 1535      PT LONG TERM GOAL #1   Title  Patient will be independent with HEP to continue benefits of therapy after discharge.     Baseline  05/03/17: Requires frequent cueing on exercise performance and does not perform HEP regularly     Time  6    Period  Weeks    Status  On-going      PT LONG TERM GOAL #2   Title  Patient will be able to perform all household chores without increase in pain to improve functional capacity.    Baseline  Requires a sitting rest break after 98mins of activity; 05/03/17: Patient continues to require a sitting rest break after 45 min     Time  6    Period  Weeks    Status  On-going      PT LONG TERM GOAL #3   Title  Patient will improve MODI to under 20% to improve self perceived lumbar function and greater ability to perform social activities without increase in pain.     Baseline  58% MODI; 05/03/17: 38% MODI    Time  6    Period  Weeks    Status  On-going            Plan - 05/03/17 1444    Clinical Impression Statement  Patient is making progress towards long term goals with improvement in MODI and resting pain. Although patient is improving, she continues to have increased pain with performing heavy chores around her house. Patient demonstrates improvement in sit-to-stands with an emphasis on speed indicating an increase LE strength and coordination. Patient continues to require extensive verbal and tactile cueing to perform glute squeezes in standing indicating poor motor control and strength of the gluteal muscles. Patient will benefit from further skilled therapy to improve motor control, increase LE strengh bilaterally, and to return to prior level of function.      Rehab Potential  Good    Clinical Impairments Affecting Rehab Potential  (+) highly motivated; (-)     PT Frequency  2x / week    PT Duration  6 weeks    PT Treatment/Interventions  Passive range of motion;Manual  techniques;Cryotherapy;Electrical Stimulation;Moist Heat;Ultrasound;Iontophoresis 4mg /ml Dexamethasone;Gait training;Therapeutic activities;Therapeutic exercise;Patient/family education;Neuromuscular re-education;Balance training;Dry needling    PT Next Visit Plan  Improve strength and endurance    PT Home Exercise Plan  See education    Consulted and Agree with Plan of Care  Patient       Patient will benefit from skilled therapeutic intervention in order to improve the following deficits and impairments:  Decreased activity tolerance, Decreased endurance, Decreased range of motion, Decreased strength, Difficulty walking, Decreased mobility, Increased muscle spasms, Postural dysfunction, Decreased coordination, Pain, Impaired sensation, Increased fascial restricitons  Visit Diagnosis: Muscle weakness (generalized)  Chronic midline low back pain without sciatica     Problem List Patient Active Problem List   Diagnosis Date Noted  . B12 deficiency 09/18/2016  . Right hip pain 08/05/2015  . Anemia 08/05/2015  . Vitamin D deficiency 08/05/2015  . Hemoptysis 08/04/2015  . Constipation 02/04/2015  . Left knee pain 01/05/2015  . Status post laminectomy with spinal fusion 02/23/2014  . Fatigue 07/07/2013  . Dyspnea on exertion 08/12/2012  . Chronic lower back pain 08/12/2012  . Urge urinary incontinence 08/12/2012  . Personal history of colonic polyps 08/12/2012  . Seasonal rhinitis 01/16/2012  . Hiatal hernia with gastroesophageal reflux   . Fatigue 09/16/2011  . Controlled atrial fibrillation ([place]) 12/15/2010  . Irritable bowel syndrome without diarrhea 12/15/2010  . Hypothyroid 12/15/2010  . Long term current use of anticoagulant therapy 12/15/2010    Ricard Dillon, SPT 05/03/2017, 3:37 PM  [facility] [address], [place], [address] Phone: [phone]   Fax:  [phone]  Name: Alexandra Martinez MRN:  [id number] Date of Birth: 1929/08/12

## 2017-05-05 ENCOUNTER — Ambulatory Visit: Payer: Medicare Other

## 2017-05-05 DIAGNOSIS — G8929 Other chronic pain: Secondary | ICD-10-CM | POA: Diagnosis not present

## 2017-05-05 DIAGNOSIS — M545 Low back pain, unspecified: Secondary | ICD-10-CM

## 2017-05-05 DIAGNOSIS — M6281 Muscle weakness (generalized): Secondary | ICD-10-CM

## 2017-05-05 NOTE — Therapy (Signed)
[facility] [address], [place], [address] Phone: [phone]   Fax:  [phone]  Physical Therapy Treatment  Patient Details  Name: Alexandra Martinez MRN: [id number] Date of Birth: 07/06/1929 Referring Provider: Jovita Gamma MD   Encounter Date: 05/05/2017  PT End of Session - 05/05/17 1440    Visit Number  10    Number of Visits  13    Date for PT Re-Evaluation  06/14/17    PT Start Time  6734    PT Stop Time  1426    PT Time Calculation (min)  41 min    Activity Tolerance  Patient tolerated treatment well    Behavior During Therapy  [facility] for tasks assessed/performed       Past Medical History:  Diagnosis Date  . Anemia    r/t uterine fibroids  . Atrial fib/flutter, transient    [facility] admission for RVR 03/2009  . DDD (degenerative disc disease)   . Dysrhythmia    A-FIB  . GERD (gastroesophageal reflux disease)   . H/O bladder infections   . H/O hiatal hernia   . H/O thyroid nodule    surgically removed  . Heart murmur    moderate MR/TR 08/2013 echo [name])  . Hemorrhoids   . Hiatal hernia with gastroesophageal reflux 2008   by EGD,  [name]  . High cholesterol   . History of uterine fibroid   . Hydronephrosis    uretero- with congenital UPJ obstruction- Dr. Edrick Oh  . Hydronephrosis with ureteropelvic junction obstruction   . Hypertension    [facility]  . Hypothyroidism   . Nocturia   . Osteoporosis   . Polyuria   . Recurrent UTI     Past Surgical History:  Procedure Laterality Date  . ADENOIDECTOMY N/A 01/05/2016   Procedure: ADENOIDECTOMY;  Surgeon: Margaretha Sheffield, MD;  Location: [facility] ORS;  Service: ENT;  Laterality: N/A;  . BACK SURGERY    . CATARACT EXTRACTION     left  . CERVICAL DISC SURGERY  1980's  . CHOLECYSTECTOMY    . NASOPHARYNGOSCOPY  01/05/2016   Procedure: NASOPHARYNGOSCOPY;  Surgeon: Margaretha Sheffield, MD;  Location: [facility] ORS;  Service: ENT;;  .  THYROID LOBECTOMY     partial, right lobe  . TOTAL ABDOMINAL HYSTERECTOMY W/ BILATERAL SALPINGOOPHORECTOMY      There were no vitals filed for this visit.  Subjective Assessment - 05/05/17 1348    Subjective  Patient reports her back pain has decreased since previous session and that pain is currently 0/10. Patient states that she continues to use brace to decrease back pain when performing tasks such as driving.    Pertinent History  PMH: A- fib, History of LBP(several years does not give exact date), gallbladder removal, history of neck pain, L2-5 Decompression    Limitations  Lifting;Standing;Walking    Currently in Pain?  No/denies    Pain Onset  More than a month ago       Therapeutic Exercise:   Seated glute squeeze with ball -- 1x20 to increase glute strength and motor control  Standing glute squeeze - 1x30 performed to improve motor control and endurance of glute muscles  Standing hip abduction -- 1x30 B to improve motor control and endurance of hip abductors; required extensive verbal and tactile cueing to perform with proper technique  Monster walks with YTB - 7 feet 1x15 - to increase LE endurance  Seated hip abduction with YTB 1x30-  to increase endurance and strength in hip abductor muscles  heel slides with patient in supine - B 1x20 to increase strength and endurance in hamstring and core musculature  bridges with feet on ball - 1x20 to increase strength and endurance of hamstrings  Patient demonstrates increased fatigue at end of session.     PT Education - 05/05/17 1439    Education provided  Yes    Education Details  HEP: Monster walks wtih YTB    Person(s) Educated  Patient    Methods  Explanation;Demonstration;Verbal cues    Comprehension  Verbalized understanding;Returned demonstration          PT Long Term Goals - 05/03/17 1535      PT LONG TERM GOAL #1   Title  Patient will be independent with HEP to continue benefits of therapy after  discharge.     Baseline  05/03/17: Requires frequent cueing on exercise performance and does not perform HEP regularly     Time  6    Period  Weeks    Status  On-going      PT LONG TERM GOAL #2   Title  Patient will be able to perform all household chores without increase in pain to improve functional capacity.    Baseline  Requires a sitting rest break after 63mins of activity; 05/03/17: Patient continues to require a sitting rest break after 45 min     Time  6    Period  Weeks    Status  On-going      PT LONG TERM GOAL #3   Title  Patient will improve MODI to under 20% to improve self perceived lumbar function and greater ability to perform social activities without increase in pain.     Baseline  58% MODI; 05/03/17: 38% MODI    Time  6    Period  Weeks    Status  On-going            Plan - 05/05/17 1443    Clinical Impression Statement  Patient demonstrates improvement with activatoin of her glute muscles during standing glute squeezes, indicating increased motor control of her glute muscles. However, patient continues to require extensive verbal and tactile cueing to perform proper technique with standing hip abduction and supine heel slides, indicating poor motor control of the core and hip musculature.  Patient will benefit from further skilled therapy to increase LE motor control and strength.    Rehab Potential  Good    Clinical Impairments Affecting Rehab Potential  (+) highly motivated; (-)     PT Frequency  2x / week    PT Duration  6 weeks    PT Treatment/Interventions  Passive range of motion;Manual techniques;Cryotherapy;Electrical Stimulation;Moist Heat;Ultrasound;Iontophoresis 4mg /ml Dexamethasone;Gait training;Therapeutic activities;Therapeutic exercise;Patient/family education;Neuromuscular re-education;Balance training;Dry needling    PT Next Visit Plan  Improve strength and endurance    PT Home Exercise Plan  See education    Consulted and Agree with Plan of Care   Patient       Patient will benefit from skilled therapeutic intervention in order to improve the following deficits and impairments:  Decreased activity tolerance, Decreased endurance, Decreased range of motion, Decreased strength, Difficulty walking, Decreased mobility, Increased muscle spasms, Postural dysfunction, Decreased coordination, Pain, Impaired sensation, Increased fascial restricitons  Visit Diagnosis: Chronic midline low back pain without sciatica  Muscle weakness (generalized)     Problem List Patient Active Problem List   Diagnosis Date Noted  . B12 deficiency 09/18/2016  . Right  hip pain 08/05/2015  . Anemia 08/05/2015  . Vitamin D deficiency 08/05/2015  . Hemoptysis 08/04/2015  . Constipation 02/04/2015  . Left knee pain 01/05/2015  . Status post laminectomy with spinal fusion 02/23/2014  . Fatigue 07/07/2013  . Dyspnea on exertion 08/12/2012  . Chronic lower back pain 08/12/2012  . Urge urinary incontinence 08/12/2012  . Personal history of colonic polyps 08/12/2012  . Seasonal rhinitis 01/16/2012  . Hiatal hernia with gastroesophageal reflux   . Fatigue 09/16/2011  . Controlled atrial fibrillation ([place]) 12/15/2010  . Irritable bowel syndrome without diarrhea 12/15/2010  . Hypothyroid 12/15/2010  . Long term current use of anticoagulant therapy 12/15/2010    Ricard Dillon, SPT 05/05/2017, 3:12 PM  [facility] [address], [place], [address] Phone: [phone]   Fax:  [phone]  Name: Alexandra Martinez MRN: [id number] Date of Birth: 26-Apr-1929

## 2017-05-10 ENCOUNTER — Ambulatory Visit: Payer: Medicare Other

## 2017-05-10 DIAGNOSIS — G8929 Other chronic pain: Secondary | ICD-10-CM | POA: Diagnosis not present

## 2017-05-10 DIAGNOSIS — M545 Low back pain, unspecified: Secondary | ICD-10-CM

## 2017-05-10 DIAGNOSIS — M6281 Muscle weakness (generalized): Secondary | ICD-10-CM

## 2017-05-10 NOTE — Therapy (Signed)
[facility] [address], [place], [address] Phone: [phone]   Fax:  [phone]  Physical Therapy Treatment  Patient Details  Name: Alexandra Martinez MRN: [id number] Date of Birth: 04-22-1929 Referring Provider: Jovita Gamma MD   Encounter Date: 05/10/2017  PT End of Session - 05/10/17 1145    Visit Number  11    Number of Visits  13    Date for PT Re-Evaluation  06/14/17    PT Start Time  7902    PT Stop Time  1130    PT Time Calculation (min)  45 min    Activity Tolerance  Patient tolerated treatment well    Behavior During Therapy  [facility] for tasks assessed/performed       Past Medical History:  Diagnosis Date  . Anemia    r/t uterine fibroids  . Atrial fib/flutter, transient    [facility] admission for RVR 03/2009  . DDD (degenerative disc disease)   . Dysrhythmia    A-FIB  . GERD (gastroesophageal reflux disease)   . H/O bladder infections   . H/O hiatal hernia   . H/O thyroid nodule    surgically removed  . Heart murmur    moderate MR/TR 08/2013 echo [name])  . Hemorrhoids   . Hiatal hernia with gastroesophageal reflux 2008   by EGD,  [name]  . High cholesterol   . History of uterine fibroid   . Hydronephrosis    uretero- with congenital UPJ obstruction- Dr. Edrick Oh  . Hydronephrosis with ureteropelvic junction obstruction   . Hypertension    [facility]  . Hypothyroidism   . Nocturia   . Osteoporosis   . Polyuria   . Recurrent UTI     Past Surgical History:  Procedure Laterality Date  . ADENOIDECTOMY N/A 01/05/2016   Procedure: ADENOIDECTOMY;  Surgeon: Margaretha Sheffield, MD;  Location: [facility] ORS;  Service: ENT;  Laterality: N/A;  . BACK SURGERY    . CATARACT EXTRACTION     left  . CERVICAL DISC SURGERY  1980's  . CHOLECYSTECTOMY    . NASOPHARYNGOSCOPY  01/05/2016   Procedure: NASOPHARYNGOSCOPY;  Surgeon: Margaretha Sheffield, MD;  Location: [facility] ORS;  Service: ENT;;  .  THYROID LOBECTOMY     partial, right lobe  . TOTAL ABDOMINAL HYSTERECTOMY W/ BILATERAL SALPINGOOPHORECTOMY      There were no vitals filed for this visit.  Subjective Assessment - 05/10/17 1044    Subjective  Patient reports increased back pain since previous session and that she has been wearing back brace when she goes out. Patient reports back pain gets worse with activity. Patient reports performing some of her HEP.    Pertinent History  PMH: A- fib, History of LBP(several years does not give exact date), gallbladder removal, history of neck pain, L2-5 Decompression    Limitations  Lifting;Standing;Walking    Currently in Pain?  Yes    Pain Score  2     Pain Location  Back    Pain Orientation  Lower    Pain Descriptors / Indicators  Aching    Pain Type  Chronic pain    Pain Onset  More than a month ago    Pain Frequency  Intermittent       Treatment:  Therapeutic Exercise:   Glute squeeze in sitting with ball -- 1x15  to increase glute activation  Glute squeezes in sitting without ball --1x20 to increase endurance and motor control of glute  muscles  Standing glute squeeze - 1x20 performed to improve motor control and endurance of glute muscles  Sit to stands -- 1x15, 1x20  to increase LE & hip muscle endurance  RTB seated hip abduction -- 1x15  to strengthen and increase endurance of the hip abductors  GTB seated hip abduction -- 1x20 - to strengthen and increase endurance of the hip abductors  Standing hip abduction -- 1x20 B to improve motor control and endurance of hip abductors; required extensive verbal and tactile cueing to perform with proper technique  Core stability in quadruped -- x1 min 30 sec to improve motor control and endurance of core musculature   Glute bridges -- 1x30 to increase strength and endurance of hamstrings    Patient demonstrates increased fatigue at end of session.        PT Education - 05/10/17 1143    Education provided  Yes     Education Details  HEP: Sit-to-stands    Person(s) Educated  Patient    Methods  Explanation;Demonstration;Tactile cues;Verbal cues    Comprehension  Verbalized understanding          PT Long Term Goals - 05/03/17 1535      PT LONG TERM GOAL #1   Title  Patient will be independent with HEP to continue benefits of therapy after discharge.     Baseline  05/03/17: Requires frequent cueing on exercise performance and does not perform HEP regularly     Time  6    Period  Weeks    Status  On-going      PT LONG TERM GOAL #2   Title  Patient will be able to perform all household chores without increase in pain to improve functional capacity.    Baseline  Requires a sitting rest break after 15mins of activity; 05/03/17: Patient continues to require a sitting rest break after 45 min     Time  6    Period  Weeks    Status  On-going      PT LONG TERM GOAL #3   Title  Patient will improve MODI to under 20% to improve self perceived lumbar function and greater ability to perform social activities without increase in pain.     Baseline  58% MODI; 05/03/17: 38% MODI    Time  6    Period  Weeks    Status  On-going            Plan - 05/10/17 1147    Clinical Impression Statement  Patient continues to demonstrate improvement with sit-to-stands with ability to perform more repetitions before fatigue, indicating greater endurance and strength of the hip/LE musculature. Although patient is improving, she continues to require increased cueing to perform proper technique for gluteal activation and hip abduction exercises, indicating poor motor control of the gluteal muscles. Patient will continue to benefit from further skilled therapy to increase hip and LE muscular endurance and motor control.    Rehab Potential  Good    Clinical Impairments Affecting Rehab Potential  (+) highly motivated; (-)     PT Frequency  2x / week    PT Duration  6 weeks    PT Treatment/Interventions  Passive range of  motion;Manual techniques;Cryotherapy;Electrical Stimulation;Moist Heat;Ultrasound;Iontophoresis 4mg /ml Dexamethasone;Gait training;Therapeutic activities;Therapeutic exercise;Patient/family education;Neuromuscular re-education;Balance training;Dry needling    PT Next Visit Plan  Improve strength, endurance, and motor control    PT Home Exercise Plan  See education    Consulted and Agree with Plan of Care  Patient       Patient will benefit from skilled therapeutic intervention in order to improve the following deficits and impairments:  Decreased activity tolerance, Decreased endurance, Decreased range of motion, Decreased strength, Difficulty walking, Decreased mobility, Increased muscle spasms, Postural dysfunction, Decreased coordination, Pain, Impaired sensation, Increased fascial restricitons  Visit Diagnosis: Chronic midline low back pain without sciatica  Muscle weakness (generalized)     Problem List Patient Active Problem List   Diagnosis Date Noted  . B12 deficiency 09/18/2016  . Right hip pain 08/05/2015  . Anemia 08/05/2015  . Vitamin D deficiency 08/05/2015  . Hemoptysis 08/04/2015  . Constipation 02/04/2015  . Left knee pain 01/05/2015  . Status post laminectomy with spinal fusion 02/23/2014  . Fatigue 07/07/2013  . Dyspnea on exertion 08/12/2012  . Chronic lower back pain 08/12/2012  . Urge urinary incontinence 08/12/2012  . Personal history of colonic polyps 08/12/2012  . Seasonal rhinitis 01/16/2012  . Hiatal hernia with gastroesophageal reflux   . Fatigue 09/16/2011  . Controlled atrial fibrillation ([place]) 12/15/2010  . Irritable bowel syndrome without diarrhea 12/15/2010  . Hypothyroid 12/15/2010  . Long term current use of anticoagulant therapy 12/15/2010    Ricard Dillon, SPT 05/10/2017, 6:17 PM  [facility] [address] [place], [address] Phone: [phone]   Fax:   [phone]  Name: Alexandra Martinez MRN: [id number] Date of Birth: [birth year]

## 2017-05-12 ENCOUNTER — Ambulatory Visit: Payer: Medicare Other

## 2017-05-12 DIAGNOSIS — G8929 Other chronic pain: Secondary | ICD-10-CM | POA: Diagnosis not present

## 2017-05-12 DIAGNOSIS — M545 Low back pain, unspecified: Secondary | ICD-10-CM

## 2017-05-12 DIAGNOSIS — M6281 Muscle weakness (generalized): Secondary | ICD-10-CM | POA: Diagnosis not present

## 2017-05-12 NOTE — Therapy (Signed)
[facility] [address] [place], [address] Phone: [phone]   Fax:  [phone]  Physical Therapy Treatment  Patient Details  Name: Alexandra Martinez MRN: [id number] Date of Birth: 19-Jun-1929 Referring Provider: Jovita Gamma MD   Encounter Date: 05/12/2017  PT End of Session - 05/12/17 1210    Visit Number  12    Number of Visits  13    Date for PT Re-Evaluation  06/14/17    PT Start Time  1115    PT Stop Time  1200    PT Time Calculation (min)  45 min    Activity Tolerance  Patient tolerated treatment well    Behavior During Therapy  [facility] for tasks assessed/performed       Past Medical History:  Diagnosis Date  . Anemia    r/t uterine fibroids  . Atrial fib/flutter, transient    [facility] admission for RVR 03/2009  . DDD (degenerative disc disease)   . Dysrhythmia    A-FIB  . GERD (gastroesophageal reflux disease)   . H/O bladder infections   . H/O hiatal hernia   . H/O thyroid nodule    surgically removed  . Heart murmur    moderate MR/TR 08/2013 echo [name])  . Hemorrhoids   . Hiatal hernia with gastroesophageal reflux 2008   by EGD,  [name]  . High cholesterol   . History of uterine fibroid   . Hydronephrosis    uretero- with congenital UPJ obstruction- Dr. Edrick Oh  . Hydronephrosis with ureteropelvic junction obstruction   . Hypertension    [facility]  . Hypothyroidism   . Nocturia   . Osteoporosis   . Polyuria   . Recurrent UTI     Past Surgical History:  Procedure Laterality Date  . ADENOIDECTOMY N/A 01/05/2016   Procedure: ADENOIDECTOMY;  Surgeon: Margaretha Sheffield, MD;  Location: [facility] ORS;  Service: ENT;  Laterality: N/A;  . BACK SURGERY    . CATARACT EXTRACTION     left  . CERVICAL DISC SURGERY  1980's  . CHOLECYSTECTOMY    . NASOPHARYNGOSCOPY  01/05/2016   Procedure: NASOPHARYNGOSCOPY;  Surgeon: Margaretha Sheffield, MD;  Location: [facility] ORS;  Service: ENT;;  .  THYROID LOBECTOMY     partial, right lobe  . TOTAL ABDOMINAL HYSTERECTOMY W/ BILATERAL SALPINGOOPHORECTOMY      There were no vitals filed for this visit.  Subjective Assessment - 05/12/17 1116    Subjective  Patient reports wearing back brace when she drives.  Pt reports back pain has been on and off since previous session and increasees with household chores that require bending.  Pt reports performing "some of her home exercises."    Pertinent History  PMH: A- fib, History of LBP(several years does not give exact date), gallbladder removal, history of neck pain, L2-5 Decompression    Limitations  Lifting;Standing;Walking    Pain Score  2     Pain Location  Back    Pain Orientation  Lower    Pain Descriptors / Indicators  Aching    Pain Type  Chronic pain    Pain Onset  More than a month ago    Pain Frequency  Intermittent        Treatment:   Therapeutic Exercise:   Glute squeezes in sitting without ball --1x20 to increase endurance and motor control of glute muscles   Standing glute squeeze - 1x20 performed to improve motor control and endurance of glute  muscles  Standing hip extension - 2x20 to increase strength and endurance of hip extensors  Standing hip abduction 2x20 to increase strength and endurance of hip abductors  Deadbug -- 2x15 B to increase core strength and hip stability  Glute Bridges - 1x20, 1x30 to increase activation and strength of the glutes   Sit to stands -- 2x20 to increase LE & hip muscle endurance  Patient demonstrates increased fatigue with exercise at end of session        PT Education - 05/12/17 1210    Education provided  Yes    Education Details  Form/technique with exercise    Person(s) Educated  Patient    Methods  Explanation;Demonstration    Comprehension  Verbalized understanding;Returned demonstration          PT Long Term Goals - 05/03/17 1535      PT LONG TERM GOAL #1   Title  Patient will be independent with HEP  to continue benefits of therapy after discharge.     Baseline  05/03/17: Requires frequent cueing on exercise performance and does not perform HEP regularly     Time  6    Period  Weeks    Status  On-going      PT LONG TERM GOAL #2   Title  Patient will be able to perform all household chores without increase in pain to improve functional capacity.    Baseline  Requires a sitting rest break after 24mins of activity; 05/03/17: Patient continues to require a sitting rest break after 45 min     Time  6    Period  Weeks    Status  On-going      PT LONG TERM GOAL #3   Title  Patient will improve MODI to under 20% to improve self perceived lumbar function and greater ability to perform social activities without increase in pain.     Baseline  58% MODI; 05/03/17: 38% MODI    Time  6    Period  Weeks    Status  On-going            Plan - 05/12/17 1211    Clinical Impression Statement  Patient demonstrates improvement in endurance and speed when performing STS, indicating increased strength of hip and LEs. Although patient is improving her strength and endurance, patient continues to require frequent cueing for proper gluteal activation during exercises, indicating poor motor control of the glute muscles. Patient will benefit from further skilled therapy to increase strength of the LEs and hip, and to improve activation of the glutes and decrease LBP.    Rehab Potential  Good    Clinical Impairments Affecting Rehab Potential  (+) highly motivated; (-)     PT Frequency  2x / week    PT Duration  6 weeks    PT Treatment/Interventions  Passive range of motion;Manual techniques;Cryotherapy;Electrical Stimulation;Moist Heat;Ultrasound;Iontophoresis 4mg /ml Dexamethasone;Gait training;Therapeutic activities;Therapeutic exercise;Patient/family education;Neuromuscular re-education;Balance training;Dry needling    PT Next Visit Plan  Improve strength, endurance, and motor control    PT Home Exercise  Plan  See education    Consulted and Agree with Plan of Care  Patient       Patient will benefit from skilled therapeutic intervention in order to improve the following deficits and impairments:  Decreased activity tolerance, Decreased endurance, Decreased range of motion, Decreased strength, Difficulty walking, Decreased mobility, Increased muscle spasms, Postural dysfunction, Decreased coordination, Pain, Impaired sensation, Increased fascial restricitons  Visit Diagnosis: Muscle weakness (generalized)  Chronic midline low back pain without sciatica     Problem List Patient Active Problem List   Diagnosis Date Noted  . B12 deficiency 09/18/2016  . Right hip pain 08/05/2015  . Anemia 08/05/2015  . Vitamin D deficiency 08/05/2015  . Hemoptysis 08/04/2015  . Constipation 02/04/2015  . Left knee pain 01/05/2015  . Status post laminectomy with spinal fusion 02/23/2014  . Fatigue 07/07/2013  . Dyspnea on exertion 08/12/2012  . Chronic lower back pain 08/12/2012  . Urge urinary incontinence 08/12/2012  . Personal history of colonic polyps 08/12/2012  . Seasonal rhinitis 01/16/2012  . Hiatal hernia with gastroesophageal reflux   . Fatigue 09/16/2011  . Controlled atrial fibrillation ([place]) 12/15/2010  . Irritable bowel syndrome without diarrhea 12/15/2010  . Hypothyroid 12/15/2010  . Long term current use of anticoagulant therapy 12/15/2010    Ricard Dillon, SPT 05/12/2017, 12:16 PM  [facility] [address] [place], [address] Phone: [phone]   Fax:  [phone]  Name: KALONI BISAILLON MRN: [id number] Date of Birth: 26-Jul-1929

## 2017-05-17 ENCOUNTER — Ambulatory Visit: Payer: Medicare Other

## 2017-05-17 DIAGNOSIS — G8929 Other chronic pain: Secondary | ICD-10-CM | POA: Diagnosis not present

## 2017-05-17 DIAGNOSIS — M545 Low back pain, unspecified: Secondary | ICD-10-CM

## 2017-05-17 DIAGNOSIS — M6281 Muscle weakness (generalized): Secondary | ICD-10-CM

## 2017-05-17 NOTE — Therapy (Signed)
[facility] [address], [place], [address] Phone: [phone]   Fax:  [phone]  Physical Therapy Treatment  Patient Details  Name: Alexandra Martinez MRN: [id number] Date of Birth: 12-Dec-1929 Referring Provider: Jovita Gamma MD   Encounter Date: 05/17/2017  PT End of Session - 05/17/17 1245    Visit Number  13    Number of Visits  23    Date for PT Re-Evaluation  06/14/17    PT Start Time  1115    PT Stop Time  1200    PT Time Calculation (min)  45 min    Activity Tolerance  Patient tolerated treatment well    Behavior During Therapy  [facility] for tasks assessed/performed       Past Medical History:  Diagnosis Date  . Anemia    r/t uterine fibroids  . Atrial fib/flutter, transient    [facility] admission for RVR 03/2009  . DDD (degenerative disc disease)   . Dysrhythmia    A-FIB  . GERD (gastroesophageal reflux disease)   . H/O bladder infections   . H/O hiatal hernia   . H/O thyroid nodule    surgically removed  . Heart murmur    moderate MR/TR 08/2013 echo [name])  . Hemorrhoids   . Hiatal hernia with gastroesophageal reflux 2008   by EGD,  [name]  . High cholesterol   . History of uterine fibroid   . Hydronephrosis    uretero- with congenital UPJ obstruction- Dr. Edrick Oh  . Hydronephrosis with ureteropelvic junction obstruction   . Hypertension    [facility]  . Hypothyroidism   . Nocturia   . Osteoporosis   . Polyuria   . Recurrent UTI     Past Surgical History:  Procedure Laterality Date  . ADENOIDECTOMY N/A 01/05/2016   Procedure: ADENOIDECTOMY;  Surgeon: Margaretha Sheffield, MD;  Location: [facility] ORS;  Service: ENT;  Laterality: N/A;  . BACK SURGERY    . CATARACT EXTRACTION     left  . CERVICAL DISC SURGERY  1980's  . CHOLECYSTECTOMY    . NASOPHARYNGOSCOPY  01/05/2016   Procedure: NASOPHARYNGOSCOPY;  Surgeon: Margaretha Sheffield, MD;  Location: [facility] ORS;  Service: ENT;;  .  THYROID LOBECTOMY     partial, right lobe  . TOTAL ABDOMINAL HYSTERECTOMY W/ BILATERAL SALPINGOOPHORECTOMY      There were no vitals filed for this visit.  Subjective Assessment - 05/17/17 1242    Subjective  Patient reports she has not worn her back brace since previous session.  Patient reports LBP is a 0/10 today. Patient reports doing "some" of her HEP since previous session.    Pertinent History  PMH: A- fib, History of LBP(several years does not give exact date), gallbladder removal, history of neck pain, L2-5 Decompression    Limitations  Lifting;Standing;Walking    Currently in Pain?  No/denies    Pain Onset  More than a month ago        Treatment:   Therapeutic Exercise:  Standing hip extensions -- 2x20 B to increase activation and endurance of the glute muscles  Deadbugs -- 1x10, 1x12 to increase core strength and endurance  Matrix, standing hip abduction -- 1x15 #25 B, 1x20 #25 B to increase strength and endurance of the hip abductors  Partial squats with chair as a cue - 2x20 to improve motor control and endurance of the LEs   Patient demonstrates increased fatigue with exercise at end of session.  PT Education - 05/17/17 1244    Education provided  Yes    Education Details  Form/technique with exercise    Person(s) Educated  Patient    Methods  Explanation;Demonstration;Tactile cues;Verbal cues    Comprehension  Verbalized understanding;Returned demonstration          PT Long Term Goals - 05/03/17 1535      PT LONG TERM GOAL #1   Title  Patient will be independent with HEP to continue benefits of therapy after discharge.     Baseline  05/03/17: Requires frequent cueing on exercise performance and does not perform HEP regularly     Time  6    Period  Weeks    Status  On-going      PT LONG TERM GOAL #2   Title  Patient will be able to perform all household chores without increase in pain to improve functional capacity.    Baseline  Requires a  sitting rest break after 64mins of activity; 05/03/17: Patient continues to require a sitting rest break after 45 min     Time  6    Period  Weeks    Status  On-going      PT LONG TERM GOAL #3   Title  Patient will improve MODI to under 20% to improve self perceived lumbar function and greater ability to perform social activities without increase in pain.     Baseline  58% MODI; 05/03/17: 38% MODI    Time  6    Period  Weeks    Status  On-going            Plan - 05/17/17 1247    Clinical Impression Statement  Patient demonstrates ability to perform greater amount of reps with hip abduction exercise, indicating increased strength and endurance of the hip abductor muscles. Although patient demonstrates improvement, patient continues to require frequent cueing to perform partial squats with proper technique, indicating poor motor control of the hip and LE musculature.  Patient will benefit from further skilled therapy to return to prior levels of function.    Rehab Potential  Good    Clinical Impairments Affecting Rehab Potential  (+) highly motivated; (-)     PT Frequency  2x / week    PT Duration  6 weeks    PT Treatment/Interventions  Passive range of motion;Manual techniques;Cryotherapy;Electrical Stimulation;Moist Heat;Ultrasound;Iontophoresis 4mg /ml Dexamethasone;Gait training;Therapeutic activities;Therapeutic exercise;Patient/family education;Neuromuscular re-education;Balance training;Dry needling    PT Next Visit Plan  Improve strength, endurance, and motor control    PT Home Exercise Plan  See education    Consulted and Agree with Plan of Care  Patient       Patient will benefit from skilled therapeutic intervention in order to improve the following deficits and impairments:  Decreased activity tolerance, Decreased endurance, Decreased range of motion, Decreased strength, Difficulty walking, Decreased mobility, Increased muscle spasms, Postural dysfunction, Decreased  coordination, Pain, Impaired sensation, Increased fascial restricitons  Visit Diagnosis: Chronic midline low back pain without sciatica  Muscle weakness (generalized)     Problem List Patient Active Problem List   Diagnosis Date Noted  . B12 deficiency 09/18/2016  . Right hip pain 08/05/2015  . Anemia 08/05/2015  . Vitamin D deficiency 08/05/2015  . Hemoptysis 08/04/2015  . Constipation 02/04/2015  . Left knee pain 01/05/2015  . Status post laminectomy with spinal fusion 02/23/2014  . Fatigue 07/07/2013  . Dyspnea on exertion 08/12/2012  . Chronic lower back pain 08/12/2012  . Urge urinary incontinence 08/12/2012  .  Personal history of colonic polyps 08/12/2012  . Seasonal rhinitis 01/16/2012  . Hiatal hernia with gastroesophageal reflux   . Fatigue 09/16/2011  . Controlled atrial fibrillation ([place]) 12/15/2010  . Irritable bowel syndrome without diarrhea 12/15/2010  . Hypothyroid 12/15/2010  . Long term current use of anticoagulant therapy 12/15/2010    Ricard Dillon, SPT 05/17/2017, 3:16 PM  [facility] [address], [place], [address] Phone: [phone]   Fax:  [phone]  Name: Alexandra Martinez MRN: [id number] Date of Birth: [birth year]

## 2017-05-19 ENCOUNTER — Ambulatory Visit: Payer: Medicare Other

## 2017-05-19 DIAGNOSIS — G8929 Other chronic pain: Secondary | ICD-10-CM | POA: Diagnosis not present

## 2017-05-19 DIAGNOSIS — M6281 Muscle weakness (generalized): Secondary | ICD-10-CM

## 2017-05-19 DIAGNOSIS — M545 Low back pain: Secondary | ICD-10-CM | POA: Diagnosis not present

## 2017-05-19 NOTE — Therapy (Signed)
[facility] [address], [place], [address] Phone: [phone]   Fax:  [phone]  Physical Therapy Treatment  Patient Details  Name: Alexandra Martinez MRN: [id number] Date of Birth: 1930-04-05 Referring Provider: Jovita Gamma MD   Encounter Date: 05/19/2017  PT End of Session - 05/19/17 1226    Visit Number  14    Number of Visits  23    Date for PT Re-Evaluation  06/14/17    PT Start Time  1115    PT Stop Time  1200    PT Time Calculation (min)  45 min    Activity Tolerance  Patient tolerated treatment well    Behavior During Therapy  [facility] for tasks assessed/performed       Past Medical History:  Diagnosis Date  . Anemia    r/t uterine fibroids  . Atrial fib/flutter, transient    [facility] admission for RVR 03/2009  . DDD (degenerative disc disease)   . Dysrhythmia    A-FIB  . GERD (gastroesophageal reflux disease)   . H/O bladder infections   . H/O hiatal hernia   . H/O thyroid nodule    surgically removed  . Heart murmur    moderate MR/TR 08/2013 echo [name])  . Hemorrhoids   . Hiatal hernia with gastroesophageal reflux 2008   by EGD,  [name]  . High cholesterol   . History of uterine fibroid   . Hydronephrosis    uretero- with congenital UPJ obstruction- Dr. Edrick Oh  . Hydronephrosis with ureteropelvic junction obstruction   . Hypertension    [facility]  . Hypothyroidism   . Nocturia   . Osteoporosis   . Polyuria   . Recurrent UTI     Past Surgical History:  Procedure Laterality Date  . ADENOIDECTOMY N/A 01/05/2016   Procedure: ADENOIDECTOMY;  Surgeon: Margaretha Sheffield, MD;  Location: [facility] ORS;  Service: ENT;  Laterality: N/A;  . BACK SURGERY    . CATARACT EXTRACTION     left  . CERVICAL DISC SURGERY  1980's  . CHOLECYSTECTOMY    . NASOPHARYNGOSCOPY  01/05/2016   Procedure: NASOPHARYNGOSCOPY;  Surgeon: Margaretha Sheffield, MD;  Location: [facility] ORS;  Service: ENT;;  .  THYROID LOBECTOMY     partial, right lobe  . TOTAL ABDOMINAL HYSTERECTOMY W/ BILATERAL SALPINGOOPHORECTOMY      There were no vitals filed for this visit.  Subjective Assessment - 05/19/17 1119    Subjective  Patient reports increase in back pain since previous session and states "cold weather makes it worse." Pt states she has been wearing back brace due to "cold weather."     Pertinent History  PMH: A- fib, History of LBP(several years does not give exact date), gallbladder removal, history of neck pain, L2-5 Decompression    Limitations  Lifting;Standing;Walking    Currently in Pain?  Yes    Pain Score  2     Pain Location  Back    Pain Orientation  Lower    Pain Descriptors / Indicators  Aching    Pain Type  Chronic pain    Pain Onset  More than a month ago    Pain Frequency  Intermittent        Treatment:   Therapeutic Exercise:   Glute bridges --2x20 to increase activation and endurance of the glutes  Deadbugs - 2x15 B to increase strength and endurance of the core musculature  Monster walks - 2x3 min to increase  strength and endurance of the LEs and hip musculature   Standing hip extensions -- 1x20 B to increase endurance of the glutes  Matrix, standing hip abduction 1x20 R, 1x12 L, 1x10 L to increase strength of the hip abductors     Patient demonstrates increased fatigue with exercise at end of session     PT Education - 05/19/17 1225    Education provided  Yes    Education Details  Form/technique with exercise    Person(s) Educated  Patient    Methods  Explanation;Demonstration;Tactile cues;Verbal cues    Comprehension  Verbalized understanding;Returned demonstration          PT Long Term Goals - 05/03/17 1535      PT LONG TERM GOAL #1   Title  Patient will be independent with HEP to continue benefits of therapy after discharge.     Baseline  05/03/17: Requires frequent cueing on exercise performance and does not perform HEP regularly     Time  6     Period  Weeks    Status  On-going      PT LONG TERM GOAL #2   Title  Patient will be able to perform all household chores without increase in pain to improve functional capacity.    Baseline  Requires a sitting rest break after 80mins of activity; 05/03/17: Patient continues to require a sitting rest break after 45 min     Time  6    Period  Weeks    Status  On-going      PT LONG TERM GOAL #3   Title  Patient will improve MODI to under 20% to improve self perceived lumbar function and greater ability to perform social activities without increase in pain.     Baseline  58% MODI; 05/03/17: 38% MODI    Time  6    Period  Weeks    Status  On-going            Plan - 05/19/17 1228    Clinical Impression Statement  Patient demonstrates increase in core and glute strength and endurance, indicated by pateint's ability to perform a greater amount of reps without and increase in fatigue. Although patient shows improvement, patient still requires frequent cueing to perform hip abduction execises with proper technique, indicating poor motor control and LE strength. Patient will benefit from further skilled therapy to return to prior levels of function.     Rehab Potential  Good    Clinical Impairments Affecting Rehab Potential  (+) highly motivated; (-)     PT Frequency  2x / week    PT Duration  6 weeks    PT Treatment/Interventions  Passive range of motion;Manual techniques;Cryotherapy;Electrical Stimulation;Moist Heat;Ultrasound;Iontophoresis 4mg /ml Dexamethasone;Gait training;Therapeutic activities;Therapeutic exercise;Patient/family education;Neuromuscular re-education;Balance training;Dry needling    PT Next Visit Plan  Improve strength, endurance, and motor control    PT Home Exercise Plan  See education    Consulted and Agree with Plan of Care  Patient       Patient will benefit from skilled therapeutic intervention in order to improve the following deficits and impairments:   Decreased activity tolerance, Decreased endurance, Decreased range of motion, Decreased strength, Difficulty walking, Decreased mobility, Increased muscle spasms, Postural dysfunction, Decreased coordination, Pain, Impaired sensation, Increased fascial restricitons  Visit Diagnosis: Chronic midline low back pain without sciatica  Muscle weakness (generalized)     Problem List Patient Active Problem List   Diagnosis Date Noted  . B12 deficiency 09/18/2016  . Right  hip pain 08/05/2015  . Anemia 08/05/2015  . Vitamin D deficiency 08/05/2015  . Hemoptysis 08/04/2015  . Constipation 02/04/2015  . Left knee pain 01/05/2015  . Status post laminectomy with spinal fusion 02/23/2014  . Fatigue 07/07/2013  . Dyspnea on exertion 08/12/2012  . Chronic lower back pain 08/12/2012  . Urge urinary incontinence 08/12/2012  . Personal history of colonic polyps 08/12/2012  . Seasonal rhinitis 01/16/2012  . Hiatal hernia with gastroesophageal reflux   . Fatigue 09/16/2011  . Controlled atrial fibrillation ([place]) 12/15/2010  . Irritable bowel syndrome without diarrhea 12/15/2010  . Hypothyroid 12/15/2010  . Long term current use of anticoagulant therapy 12/15/2010    Ricard Dillon, SPT 05/19/2017, 12:32 PM  [facility] [address], [place], [address] Phone: [phone]   Fax:  [phone]  Name: Alexandra Martinez MRN: [id number] Date of Birth: Oct 31, 1929

## 2017-05-26 ENCOUNTER — Ambulatory Visit: Payer: Medicare Other | Attending: Neurosurgery

## 2017-05-26 DIAGNOSIS — M545 Low back pain: Secondary | ICD-10-CM | POA: Insufficient documentation

## 2017-05-26 DIAGNOSIS — G8929 Other chronic pain: Secondary | ICD-10-CM | POA: Insufficient documentation

## 2017-05-26 DIAGNOSIS — M6281 Muscle weakness (generalized): Secondary | ICD-10-CM | POA: Diagnosis not present

## 2017-05-26 NOTE — Therapy (Signed)
[facility] [address], [place], [address] Phone: [phone]   Fax:  [phone]  Physical Therapy Treatment  Patient Details  Name: Alexandra Martinez MRN: [id number] Date of Birth: 1929/12/14 Referring Provider: Jovita Gamma MD   Encounter Date: 05/26/2017  PT End of Session - 05/26/17 1721    Visit Number  15    Number of Visits  23    Date for PT Re-Evaluation  06/14/17    PT Start Time  6270    PT Stop Time  1730    PT Time Calculation (min)  45 min    Activity Tolerance  Patient tolerated treatment well    Behavior During Therapy  [facility] for tasks assessed/performed       Past Medical History:  Diagnosis Date  . Anemia    r/t uterine fibroids  . Atrial fib/flutter, transient    [facility] admission for RVR 03/2009  . DDD (degenerative disc disease)   . Dysrhythmia    A-FIB  . GERD (gastroesophageal reflux disease)   . H/O bladder infections   . H/O hiatal hernia   . H/O thyroid nodule    surgically removed  . Heart murmur    moderate MR/TR 08/2013 echo [name])  . Hemorrhoids   . Hiatal hernia with gastroesophageal reflux 2008   by EGD,  [name]  . High cholesterol   . History of uterine fibroid   . Hydronephrosis    uretero- with congenital UPJ obstruction- Dr. Edrick Oh  . Hydronephrosis with ureteropelvic junction obstruction   . Hypertension    [facility]  . Hypothyroidism   . Nocturia   . Osteoporosis   . Polyuria   . Recurrent UTI     Past Surgical History:  Procedure Laterality Date  . ADENOIDECTOMY N/A 01/05/2016   Procedure: ADENOIDECTOMY;  Surgeon: Margaretha Sheffield, MD;  Location: [facility] ORS;  Service: ENT;  Laterality: N/A;  . BACK SURGERY    . CATARACT EXTRACTION     left  . CERVICAL DISC SURGERY  1980's  . CHOLECYSTECTOMY    . NASOPHARYNGOSCOPY  01/05/2016   Procedure: NASOPHARYNGOSCOPY;  Surgeon: Margaretha Sheffield, MD;  Location: [facility] ORS;  Service: ENT;;  . THYROID  LOBECTOMY     partial, right lobe  . TOTAL ABDOMINAL HYSTERECTOMY W/ BILATERAL SALPINGOOPHORECTOMY      There were no vitals filed for this visit.  Subjective Assessment - 05/26/17 1712    Subjective  Patient reports pain in back continues to eb and flow in terms of intensity. Patient reports she wants to be ready to stop therapy but is unsure if she is ready. Patient states pain is improving but she has not been performing her exercises at home.    Pertinent History  PMH: A- fib, History of LBP(several years does not give exact date), gallbladder removal, history of neck pain, L2-5 Decompression    Limitations  Lifting;Standing;Walking    Currently in Pain?  No/denies    Pain Onset  More than a month ago         Treatment:   Therapeutic Exercise:    Glute bridges --2x20 to increase activation and endurance of the glutes; with hip abduction   Deadbugs - 2x15 B to increase strength and endurance of the core musculature  Sit to stands with holding yellow ball - x5, x10, x 10 2kg  Matrix, standing hip abduction 2x20 R, 2x20 L to increase strength of the hip abductors 40#  Patient demonstrates increased fatigue with exercise at end of session    PT Education - 05/26/17 1716    Education provided  Yes    Education Details  form/technique with exercise    Person(s) Educated  Patient    Methods  Explanation;Demonstration    Comprehension  Verbalized understanding;Returned demonstration          PT Long Term Goals - 05/03/17 1535      PT LONG TERM GOAL #1   Title  Patient will be independent with HEP to continue benefits of therapy after discharge.     Baseline  05/03/17: Requires frequent cueing on exercise performance and does not perform HEP regularly     Time  6    Period  Weeks    Status  On-going      PT LONG TERM GOAL #2   Title  Patient will be able to perform all household chores without increase in pain to improve functional capacity.    Baseline  Requires  a sitting rest break after 21mins of activity; 05/03/17: Patient continues to require a sitting rest break after 45 min     Time  6    Period  Weeks    Status  On-going      PT LONG TERM GOAL #3   Title  Patient will improve MODI to under 20% to improve self perceived lumbar function and greater ability to perform social activities without increase in pain.     Baseline  58% MODI; 05/03/17: 38% MODI    Time  6    Period  Weeks    Status  On-going            Plan - 05/26/17 1723    Clinical Impression Statement  Patient demonstrates improvement with exercise performance with ability to perform higher density of exercises before onset of fatigue. Although patient is improving, she continues to demnstrate increased pain and faitgue wit performing household activities and will benefit from further skilled therapy to return to prior level of function.     Rehab Potential  Good    Clinical Impairments Affecting Rehab Potential  (+) highly motivated; (-)     PT Frequency  2x / week    PT Duration  6 weeks    PT Treatment/Interventions  Passive range of motion;Manual techniques;Cryotherapy;Electrical Stimulation;Moist Heat;Ultrasound;Iontophoresis 4mg /ml Dexamethasone;Gait training;Therapeutic activities;Therapeutic exercise;Patient/family education;Neuromuscular re-education;Balance training;Dry needling    PT Next Visit Plan  Improve strength, endurance, and motor control    PT Home Exercise Plan  See education    Consulted and Agree with Plan of Care  Patient       Patient will benefit from skilled therapeutic intervention in order to improve the following deficits and impairments:  Decreased activity tolerance, Decreased endurance, Decreased range of motion, Decreased strength, Difficulty walking, Decreased mobility, Increased muscle spasms, Postural dysfunction, Decreased coordination, Pain, Impaired sensation, Increased fascial restricitons  Visit Diagnosis: Chronic midline low back  pain without sciatica  Muscle weakness (generalized)     Problem List Patient Active Problem List   Diagnosis Date Noted  . B12 deficiency 09/18/2016  . Right hip pain 08/05/2015  . Anemia 08/05/2015  . Vitamin D deficiency 08/05/2015  . Hemoptysis 08/04/2015  . Constipation 02/04/2015  . Left knee pain 01/05/2015  . Status post laminectomy with spinal fusion 02/23/2014  . Fatigue 07/07/2013  . Dyspnea on exertion 08/12/2012  . Chronic lower back pain 08/12/2012  . Urge urinary incontinence 08/12/2012  . Personal history of  colonic polyps 08/12/2012  . Seasonal rhinitis 01/16/2012  . Hiatal hernia with gastroesophageal reflux   . Fatigue 09/16/2011  . Controlled atrial fibrillation ([place]) 12/15/2010  . Irritable bowel syndrome without diarrhea 12/15/2010  . Hypothyroid 12/15/2010  . Long term current use of anticoagulant therapy 12/15/2010    Blythe Stanford, PT DPT 05/26/2017, 5:31 PM  [facility] [address] [place], [address] Phone: [phone]   Fax:  [phone]  Name: KEYLI DUROSS MRN: [id number] Date of Birth: Aug 09, 1929

## 2017-05-28 ENCOUNTER — Other Ambulatory Visit: Payer: Self-pay | Admitting: Internal Medicine

## 2017-06-01 ENCOUNTER — Ambulatory Visit: Payer: Medicare Other

## 2017-06-01 DIAGNOSIS — M545 Low back pain, unspecified: Secondary | ICD-10-CM

## 2017-06-01 DIAGNOSIS — G8929 Other chronic pain: Secondary | ICD-10-CM | POA: Diagnosis not present

## 2017-06-01 DIAGNOSIS — M6281 Muscle weakness (generalized): Secondary | ICD-10-CM

## 2017-06-01 NOTE — Therapy (Signed)
[facility] [address], [place], [address] Phone: [phone]   Fax:  [phone]  Physical Therapy Treatment  Patient Details  Name: Alexandra Martinez MRN: [id number] Date of Birth: 12-07-1929 Referring Provider: Jovita Gamma MD   Encounter Date: 06/01/2017  PT End of Session - 06/01/17 1240    Visit Number  16    Number of Visits  23    Date for PT Re-Evaluation  06/14/17    PT Start Time  1220    PT Stop Time  1300    PT Time Calculation (min)  40 min    Activity Tolerance  Patient tolerated treatment well    Behavior During Therapy  [facility] for tasks assessed/performed       Past Medical History:  Diagnosis Date  . Anemia    r/t uterine fibroids  . Atrial fib/flutter, transient    [facility] admission for RVR 03/2009  . DDD (degenerative disc disease)   . Dysrhythmia    A-FIB  . GERD (gastroesophageal reflux disease)   . H/O bladder infections   . H/O hiatal hernia   . H/O thyroid nodule    surgically removed  . Heart murmur    moderate MR/TR 08/2013 echo [name])  . Hemorrhoids   . Hiatal hernia with gastroesophageal reflux 2008   by EGD,  [name]  . High cholesterol   . History of uterine fibroid   . Hydronephrosis    uretero- with congenital UPJ obstruction- Dr. Edrick Oh  . Hydronephrosis with ureteropelvic junction obstruction   . Hypertension    [facility]  . Hypothyroidism   . Nocturia   . Osteoporosis   . Polyuria   . Recurrent UTI     Past Surgical History:  Procedure Laterality Date  . ADENOIDECTOMY N/A 01/05/2016   Procedure: ADENOIDECTOMY;  Surgeon: Margaretha Sheffield, MD;  Location: [facility] ORS;  Service: ENT;  Laterality: N/A;  . BACK SURGERY    . CATARACT EXTRACTION     left  . CERVICAL DISC SURGERY  1980's  . CHOLECYSTECTOMY    . NASOPHARYNGOSCOPY  01/05/2016   Procedure: NASOPHARYNGOSCOPY;  Surgeon: Margaretha Sheffield, MD;  Location: [facility] ORS;  Service: ENT;;  .  THYROID LOBECTOMY     partial, right lobe  . TOTAL ABDOMINAL HYSTERECTOMY W/ BILATERAL SALPINGOOPHORECTOMY      There were no vitals filed for this visit.  Subjective Assessment - 06/01/17 1232    Subjective  Patient reports she continues to wear her back brace when going on into the community. Patient reports she has " not been performing exercises like she should".     Pertinent History  PMH: A- fib, History of LBP(several years does not give exact date), gallbladder removal, history of neck pain, L2-5 Decompression    Limitations  Lifting;Standing;Walking    Currently in Pain?  No/denies    Pain Onset  More than a month ago       Treatment:   Therapeutic Exercise:   TRX squats with UE support - 2 x 20 with frequent cueing on proper form and technique  Matrix, standing hip abduction 2x10 R, 2x10 L to increase strength of the hip abductors 40#   Side stepping against single black tubing - x 6 performed bilaterally (taking 4 steps out during each rep)  Wedding marches with holding 7# weights B - 2x64ft   Hip extension in standing at hip machine - 40# x20  Sit to stands with  holding yellow ball -  2 x 10  3kg   Patient demonstrates increased fatigue with exercise at end of session   PT Education - 06/01/17 1236    Education provided  Yes    Education Details  form/technique with exercise    Person(s) Educated  Patient    Methods  Explanation;Demonstration    Comprehension  Verbalized understanding;Returned demonstration          PT Long Term Goals - 05/03/17 1535      PT LONG TERM GOAL #1   Title  Patient will be independent with HEP to continue benefits of therapy after discharge.     Baseline  05/03/17: Requires frequent cueing on exercise performance and does not perform HEP regularly     Time  6    Period  Weeks    Status  On-going      PT LONG TERM GOAL #2   Title  Patient will be able to perform all household chores without increase in pain to improve  functional capacity.    Baseline  Requires a sitting rest break after 68mins of activity; 05/03/17: Patient continues to require a sitting rest break after 45 min     Time  6    Period  Weeks    Status  On-going      PT LONG TERM GOAL #3   Title  Patient will improve MODI to under 20% to improve self perceived lumbar function and greater ability to perform social activities without increase in pain.     Baseline  58% MODI; 05/03/17: 38% MODI    Time  6    Period  Weeks    Status  On-going            Plan - 06/01/17 1241    Clinical Impression Statement  Patient requires increased cueing to perform exercises indicating poor motor control. Continued to progress exercises performing greater amount of standing exercises to improve ability to perform chores before onset of pain and fatigue. Patient demonstrates early onset of fatigue with hip exercises and patient will benefit from further skilled therapy to return to prior level of function.     Rehab Potential  Good    Clinical Impairments Affecting Rehab Potential  (+) highly motivated; (-)     PT Frequency  2x / week    PT Duration  6 weeks    PT Treatment/Interventions  Passive range of motion;Manual techniques;Cryotherapy;Electrical Stimulation;Moist Heat;Ultrasound;Iontophoresis 4mg /ml Dexamethasone;Gait training;Therapeutic activities;Therapeutic exercise;Patient/family education;Neuromuscular re-education;Balance training;Dry needling    PT Next Visit Plan  Improve strength, endurance, and motor control    PT Home Exercise Plan  See education    Consulted and Agree with Plan of Care  Patient       Patient will benefit from skilled therapeutic intervention in order to improve the following deficits and impairments:  Decreased activity tolerance, Decreased endurance, Decreased range of motion, Decreased strength, Difficulty walking, Decreased mobility, Increased muscle spasms, Postural dysfunction, Decreased coordination, Pain,  Impaired sensation, Increased fascial restricitons  Visit Diagnosis: Muscle weakness (generalized)     Problem List Patient Active Problem List   Diagnosis Date Noted  . B12 deficiency 09/18/2016  . Right hip pain 08/05/2015  . Anemia 08/05/2015  . Vitamin D deficiency 08/05/2015  . Hemoptysis 08/04/2015  . Constipation 02/04/2015  . Left knee pain 01/05/2015  . Status post laminectomy with spinal fusion 02/23/2014  . Fatigue 07/07/2013  . Dyspnea on exertion 08/12/2012  . Chronic lower back pain  08/12/2012  . Urge urinary incontinence 08/12/2012  . Personal history of colonic polyps 08/12/2012  . Seasonal rhinitis 01/16/2012  . Hiatal hernia with gastroesophageal reflux   . Fatigue 09/16/2011  . Controlled atrial fibrillation ([place]) 12/15/2010  . Irritable bowel syndrome without diarrhea 12/15/2010  . Hypothyroid 12/15/2010  . Long term current use of anticoagulant therapy 12/15/2010    Blythe Stanford, PT DPT 06/01/2017, 1:03 PM  [facility] [address], [place], [address] Phone: [phone]   Fax:  [phone]  Name: Alexandra Martinez MRN: [id number] Date of Birth: January 08, 1930

## 2017-06-03 ENCOUNTER — Other Ambulatory Visit: Payer: Self-pay

## 2017-06-03 ENCOUNTER — Encounter: Payer: Self-pay | Admitting: Emergency Medicine

## 2017-06-03 ENCOUNTER — Emergency Department
Admission: EM | Admit: 2017-06-03 | Discharge: 2017-06-03 | Disposition: A | Discharge status: Home / Self Care | Payer: Medicare Other | Attending: Emergency Medicine | Admitting: Emergency Medicine

## 2017-06-03 ENCOUNTER — Emergency Department: Payer: Medicare Other

## 2017-06-03 DIAGNOSIS — I10 Essential (primary) hypertension: Secondary | ICD-10-CM | POA: Diagnosis not present

## 2017-06-03 DIAGNOSIS — R0989 Other specified symptoms and signs involving the circulatory and respiratory systems: Secondary | ICD-10-CM | POA: Diagnosis not present

## 2017-06-03 DIAGNOSIS — E039 Hypothyroidism, unspecified: Secondary | ICD-10-CM | POA: Diagnosis not present

## 2017-06-03 DIAGNOSIS — R42 Dizziness and giddiness: Secondary | ICD-10-CM | POA: Diagnosis not present

## 2017-06-03 DIAGNOSIS — Z7901 Long term (current) use of anticoagulants: Secondary | ICD-10-CM | POA: Insufficient documentation

## 2017-06-03 DIAGNOSIS — I4891 Unspecified atrial fibrillation: Secondary | ICD-10-CM | POA: Diagnosis not present

## 2017-06-03 DIAGNOSIS — Z79899 Other long term (current) drug therapy: Secondary | ICD-10-CM | POA: Diagnosis not present

## 2017-06-03 DIAGNOSIS — R110 Nausea: Secondary | ICD-10-CM | POA: Diagnosis not present

## 2017-06-03 LAB — URINALYSIS, COMPLETE (UACMP) WITH MICROSCOPIC
Bacteria, UA: NONE SEEN
Bilirubin Urine: NEGATIVE
Glucose, UA: NEGATIVE mg/dL
Hgb urine dipstick: NEGATIVE
Ketones, ur: NEGATIVE mg/dL
Nitrite: NEGATIVE
Protein, ur: NEGATIVE mg/dL
Specific Gravity, Urine: 1.003 — ABNORMAL LOW (ref 1.005–1.030)
Squamous Epithelial / LPF: NONE SEEN
pH: 6 (ref 5.0–8.0)

## 2017-06-03 LAB — TROPONIN I: Troponin I: <0.03 ng/mL (ref <0.03)

## 2017-06-03 LAB — CBC
HCT: 34.4 % — ABNORMAL LOW (ref 35.0–47.0)
Hemoglobin: 11.4 g/dL — ABNORMAL LOW (ref 12.0–16.0)
MCH: 30.2 pg (ref 26.0–34.0)
MCHC: 33 g/dL (ref 32.0–36.0)
MCV: 91.6 fL (ref 80.0–100.0)
Platelets: 162 10*3/uL (ref 150–440)
RBC: 3.76 MIL/uL — ABNORMAL LOW (ref 3.80–5.20)
RDW: 13.3 % (ref 11.5–14.5)
WBC: 5.9 10*3/uL (ref 3.6–11.0)

## 2017-06-03 LAB — BASIC METABOLIC PANEL
Anion gap: 7 (ref 5–15)
BUN: 15 mg/dL (ref 6–20)
CO2: 25 mmol/L (ref 22–32)
Calcium: 8.8 mg/dL — ABNORMAL LOW (ref 8.9–10.3)
Chloride: 107 mmol/L (ref 101–111)
Creatinine, Ser: 1.1 mg/dL — ABNORMAL HIGH (ref 0.44–1.00)
GFR calc Af Amer: 52 mL/min — ABNORMAL LOW (ref >60)
GFR calc non Af Amer: 44 mL/min — ABNORMAL LOW (ref >60)
Glucose, Bld: 105 mg/dL — ABNORMAL HIGH (ref 65–99)
Potassium: 4.2 mmol/L (ref 3.5–5.1)
Sodium: 139 mmol/L (ref 135–145)

## 2017-06-03 MED ORDER — SODIUM CHLORIDE 0.9 % IV BOLUS (SEPSIS)
500.0000 mL | Freq: Once | INTRAVENOUS | Status: AC
  Administered 2017-06-03: 500 mL via INTRAVENOUS

## 2017-06-03 NOTE — ED Triage Notes (Signed)
Pt to ed with c/o dizziness and near syncopal episode this am at home.  Pt states when she awoke she felt like she was going to fall over.  Pt states she sat back down on the bed.  Pt states she continues to feel weak and dizzy but it is better overall.  Pt denies chest pain, denies headache, no slurred speech noted, no facial drooping noted. No obvious unilateral weakness.

## 2017-06-03 NOTE — Discharge Instructions (Signed)
Eat and drink plenty of food and fluid, return to the emergency room for new or worrisome symptoms going chest pain shortness of breath lightheadedness, fall, headache, or if you have any other complaints.  Follow closely with your primary care doctor and your cardiologist on Monday.

## 2017-06-03 NOTE — ED Notes (Signed)
PT denies any dizziness when changing positions

## 2017-06-03 NOTE — ED Notes (Signed)
Pt given sandwich tray and water 

## 2017-06-03 NOTE — ED Provider Notes (Addendum)
[facility] Emergency Department Provider Note  ____________________________________________   I have reviewed the triage vital signs and the nursing notes. Where available I have reviewed prior notes and, if possible and indicated, outside hospital notes.    HISTORY  Chief Complaint Dizziness    HPI Alexandra Martinez is a 84 y.o. female  3 mitral regurg, echo 2015 showed moderate regurg TR as well, history of paroxysmal atrial fibrillation, urinary tract infections, lives at home, got up this morning, felt lightheaded for a few minutes, since that time she is felt back to normal.  Did not pass out.  No chest pain or shortness of breath no alleviating or aggravating symptoms.  She just felt lightheaded.  She states sometimes she feels nauseated in the morning.  She does not feel that she was particular nauseated today.  She did not vomit.  No recent diarrhea no melena no bright red blood per rectum no hematemesis, she denies chest pain shortness of breath, she did not have palpitations, she just felt lightheaded when she first got out of bed.  At this time she feels fine.  She and family are eager to go home.  Past Medical History:  Diagnosis Date  . Anemia    r/t uterine fibroids  . Atrial fib/flutter, transient    [facility] admission for RVR 03/2009  . DDD (degenerative disc disease)   . Dysrhythmia    A-FIB  . GERD (gastroesophageal reflux disease)   . H/O bladder infections   . H/O hiatal hernia   . H/O thyroid nodule    surgically removed  . Heart murmur    moderate MR/TR 08/2013 echo [name])  . Hemorrhoids   . Hiatal hernia with gastroesophageal reflux 2008   by EGD,  [name]  . High cholesterol   . History of uterine fibroid   . Hydronephrosis    uretero- with congenital UPJ obstruction- Dr. Edrick Oh  . Hydronephrosis with ureteropelvic junction obstruction   . Hypertension    [facility] Cardiology  . Hypothyroidism   . Nocturia   .  Osteoporosis   . Polyuria   . Recurrent UTI     Patient Active Problem List   Diagnosis Date Noted  . B12 deficiency 09/18/2016  . Right hip pain 08/05/2015  . Anemia 08/05/2015  . Vitamin D deficiency 08/05/2015  . Hemoptysis 08/04/2015  . Constipation 02/04/2015  . Left knee pain 01/05/2015  . Status post laminectomy with spinal fusion 02/23/2014  . Fatigue 07/07/2013  . Dyspnea on exertion 08/12/2012  . Chronic lower back pain 08/12/2012  . Urge urinary incontinence 08/12/2012  . Personal history of colonic polyps 08/12/2012  . Seasonal rhinitis 01/16/2012  . Hiatal hernia with gastroesophageal reflux   . Fatigue 09/16/2011  . Controlled atrial fibrillation ([place]) 12/15/2010  . Irritable bowel syndrome without diarrhea 12/15/2010  . Hypothyroid 12/15/2010  . Long term current use of anticoagulant therapy 12/15/2010    Past Surgical History:  Procedure Laterality Date  . ADENOIDECTOMY N/A 01/05/2016   Procedure: ADENOIDECTOMY;  Surgeon: Margaretha Sheffield, MD;  Location: [facility] ORS;  Service: ENT;  Laterality: N/A;  . BACK SURGERY    . CATARACT EXTRACTION     left  . CERVICAL DISC SURGERY  1980's  . CHOLECYSTECTOMY    . NASOPHARYNGOSCOPY  01/05/2016   Procedure: NASOPHARYNGOSCOPY;  Surgeon: Margaretha Sheffield, MD;  Location: [facility] ORS;  Service: ENT;;  . THYROID LOBECTOMY     partial, right lobe  . TOTAL ABDOMINAL HYSTERECTOMY W/  BILATERAL SALPINGOOPHORECTOMY      Prior to Admission medications   Medication Sig Start Date End Date Taking? Authorizing Provider  acetaminophen (TYLENOL) 500 MG tablet Take 500 mg by mouth every 6 (six) hours as needed for headache.    [provider]  Esomeprazole Magnesium (NEXIUM PO) Take 20 mg by mouth as needed.     [provider]  furosemide (LASIX) 20 MG tablet Take by mouth. 06/16/16   [provider]  levothyroxine (SYNTHROID, LEVOTHROID) 50 MCG tablet TAKE 1 TABLET BY MOUTH EVERY DAY 09/27/16   Crecencio Mc, MD   losartan (COZAAR) 50 MG tablet  04/26/16   [provider]  metoprolol (LOPRESSOR) 100 MG tablet TAKE 1 TABLET BY MOUTH TWICE DAILY 07/25/16   Crecencio Mc, MD  metoprolol tartrate (LOPRESSOR) 100 MG tablet TAKE 1 TABLET BY MOUTH TWICE DAILY 05/30/17   Crecencio Mc, MD  ondansetron (ZOFRAN) 4 MG tablet TAKE 1 TABLET BY MOUTH EVERY 8 HOURS AS NEEDED FOR NAUSEA OR VOMITING 12/06/16   Crecencio Mc, MD  polyethylene glycol powder (GLYCOLAX/MIRALAX) powder Take 17 g by mouth daily as needed for mild constipation. 01/02/15   Leone Haven, MD  pravastatin (PRAVACHOL) 20 MG tablet TAKE 1 TABLET(20 MG) BY MOUTH EVERY NIGHT 03/30/17   Crecencio Mc, MD  Rivaroxaban (XARELTO) 15 MG TABS tablet Take 15 mg by mouth daily with supper.    [provider]  Simethicone (EQ GAS RELIEF) 125 MG CAPS Take 125 mg by mouth daily as needed (gas). Reported on 07/01/2015    [provider]  traMADol (ULTRAM) 50 MG tablet Take 1 tablet (50 mg total) by mouth every 8 (eight) hours as needed. 08/23/16   Crecencio Mc, MD    Allergies Celebrex [celecoxib]; Venlafaxine; Vioxx [rofecoxib]; Codeine; Dexilant [dexlansoprazole]; and Eliquis [apixaban]  Family History  Problem Relation Age of Onset  . Heart disease Mother   . Coronary artery disease Father        MI's    Social History Social History   Tobacco Use  . Smoking status: Never Smoker  . Smokeless tobacco: Never Used  Substance Use Topics  . Alcohol use: No  . Drug use: No    Review of Systems Constitutional: No fever/chills Eyes: No visual changes. ENT: No sore throat. No stiff neck no neck pain Cardiovascular: Denies chest pain. Respiratory: Denies shortness of breath. Gastrointestinal:   no vomiting.  No diarrhea.  No constipation. Genitourinary: Negative for dysuria. Musculoskeletal: Negative lower extremity swelling Skin: Negative for rash. Neurological: Negative for severe headaches, focal weakness or  numbness.   ____________________________________________   PHYSICAL EXAM:  VITAL SIGNS: ED Triage Vitals  Enc Vitals Group     BP 06/03/17 1039 110/69     Pulse Rate 06/03/17 1039 70     Resp 06/03/17 1039 16     Temp 06/03/17 1039 98 F (36.7 C)     Temp Source 06/03/17 1039 Oral     SpO2 06/03/17 1039 100 %     Weight 06/03/17 1040 159 lb (72.1 kg)     Height --      Head Circumference --      Peak Flow --      Pain Score 06/03/17 1040 0     Pain Loc --      Pain Edu? --      Excl. in [place]? --     Constitutional: Alert and oriented. Well appearing and in  no acute distress. Eyes: Conjunctivae are normal Head: Atraumatic HEENT: No congestion/rhinnorhea. Mucous membranes are moist.  Oropharynx non-erythematous Neck:   Nontender with no meningismus, no masses, no stridor Cardiovascular: Normal rate, regular rhythm. Grossly normal heart sounds.  Good peripheral circulation. Respiratory: Normal respiratory effort.  No retractions. Lungs CTAB. Abdominal: Soft and nontender. No distention. No guarding no rebound Back:  There is no focal tenderness or step off.  there is no midline tenderness there are no lesions noted. there is no CVA tenderness Musculoskeletal: No lower extremity tenderness, no upper extremity tenderness. No joint effusions, no DVT signs strong distal pulses no edema Neurologic:  Normal speech and language. No gross focal neurologic deficits are appreciated.  Skin:  Skin is warm, dry and intact. No rash noted. Psychiatric: Mood and affect are normal. Speech and behavior are normal.  ____________________________________________   LABS (all labs ordered are listed, but only abnormal results are displayed)  Labs Reviewed  BASIC METABOLIC PANEL - Abnormal; Notable for the following components:      Result Value   Glucose, Bld 105 (*)    Creatinine, Ser 1.10 (*)    Calcium 8.8 (*)    GFR calc non Af Amer 44 (*)    GFR calc Af Amer 52 (*)    All other  components within normal limits  CBC - Abnormal; Notable for the following components:   RBC 3.76 (*)    Hemoglobin 11.4 (*)    HCT 34.4 (*)    All other components within normal limits  URINALYSIS, COMPLETE (UACMP) WITH MICROSCOPIC  TROPONIN I  CBG MONITORING, ED    Pertinent labs  results that were available during my care of the patient were reviewed by me and considered in my medical decision making (see chart for details). ____________________________________________  EKG  I personally interpreted any EKGs ordered by me or triage Likely sinus rhythm no acute ST elevation or depression no acute ischemic changes, could be a more regular A. fib but looks to be sinus to me  ____________________________________________  RADIOLOGY  Pertinent labs & imaging results that were available during my care of the patient were reviewed by me and considered in my medical decision making (see chart for details). If possible, patient and/or family made aware of any abnormal findings.  No results found. ____________________________________________    PROCEDURES  Procedure(s) performed: None  Procedures  Critical Care performed: None  ____________________________________________   INITIAL IMPRESSION / ASSESSMENT AND PLAN / ED COURSE  Pertinent labs & imaging results that were available during my care of the patient were reviewed by me and considered in my medical decision making (see chart for details).  Felt lightheaded this morning, no chest pain or shortness of breath no other alleviating or aggravating symptoms or complications, has no complaints at this time, at this time she does appear to be sinus on the monitor she does have a history of atrial fibrillation in the past.  ----------------------------------------- 5:03 PM on 06/03/2017 -----------------------------------------  Repeat EKG shows atrial fibrillation, she does have a history of that she is on Xarelto.  She has  no other complaints here, the family inferior to go home urine is reassuring blood pressure and vitals are reassuring, we will discharge her at her request with close outpatient follow-up  ----------------------------------------- 5:17 PM on 06/03/2017 -----------------------------------------  I discussed with Dr. [name], her cardiologist, patient has no symptoms here no complaints and she and her family have asked for discharge several times.  She is  not orthostatic we will discharge her.  Dr. Jose Persia agrees.  They will see her on Monday.  Patient has no chest pain no shortness of breath, no symptoms and no concerns    ____________________________________________   FINAL CLINICAL IMPRESSION(S) / ED DIAGNOSES  Final diagnoses:  None      This chart was dictated using voice recognition software.  Despite best efforts to proofread,  errors can occur which can change meaning.      Schuyler Amor, MD 06/03/17 1701    Schuyler Amor, MD 06/03/17 1703    Schuyler Amor, MD 06/03/17 [phone]

## 2017-06-03 NOTE — ED Notes (Signed)
ED Provider at bedside. 

## 2017-06-05 LAB — URINE CULTURE

## 2017-06-06 ENCOUNTER — Telehealth: Payer: Self-pay | Admitting: Internal Medicine

## 2017-06-06 NOTE — Telephone Encounter (Signed)
Patient scheduled.

## 2017-06-06 NOTE — Telephone Encounter (Signed)
Copied from [place]. Topic: Appointment Scheduling - Scheduling Inquiry for Clinic >> Jun 06, 2017  9:43 AM Hewitt Shorts wrote: Reason for CRM: pt is calling to get a hospital follow up with Dr. Derrel Nip -but her schedule is booked please review and call [phone] -

## 2017-06-07 ENCOUNTER — Ambulatory Visit: Payer: Medicare Other

## 2017-06-07 DIAGNOSIS — G8929 Other chronic pain: Secondary | ICD-10-CM | POA: Diagnosis not present

## 2017-06-07 DIAGNOSIS — M6281 Muscle weakness (generalized): Secondary | ICD-10-CM

## 2017-06-07 DIAGNOSIS — M545 Low back pain: Principal | ICD-10-CM

## 2017-06-07 NOTE — Therapy (Signed)
[facility] [address], [place], [address] Phone: [phone]   Fax:  [phone]  Physical Therapy Treatment  Patient Details  Name: Alexandra Martinez MRN: [id number] Date of Birth: Aug 11, 1929 Referring Provider: Jovita Gamma MD   Encounter Date: 06/07/2017  PT End of Session - 06/07/17 1211    Visit Number  17    Number of Visits  23    Date for PT Re-Evaluation  06/14/17    PT Start Time  1115    PT Stop Time  1201    PT Time Calculation (min)  46 min    Activity Tolerance  Patient tolerated treatment well    Behavior During Therapy  [facility] for tasks assessed/performed       Past Medical History:  Diagnosis Date  . Anemia    r/t uterine fibroids  . Atrial fib/flutter, transient    [facility] admission for RVR 03/2009  . DDD (degenerative disc disease)   . Dysrhythmia    A-FIB  . GERD (gastroesophageal reflux disease)   . H/O bladder infections   . H/O hiatal hernia   . H/O thyroid nodule    surgically removed  . Heart murmur    moderate MR/TR 08/2013 echo [name])  . Hemorrhoids   . Hiatal hernia with gastroesophageal reflux 2008   by EGD,  [name]  . High cholesterol   . History of uterine fibroid   . Hydronephrosis    uretero- with congenital UPJ obstruction- Dr. Edrick Oh  . Hydronephrosis with ureteropelvic junction obstruction   . Hypertension    [facility] Cardiology  . Hypothyroidism   . Nocturia   . Osteoporosis   . Polyuria   . Recurrent UTI     Past Surgical History:  Procedure Laterality Date  . ADENOIDECTOMY N/A 01/05/2016   Procedure: ADENOIDECTOMY;  Surgeon: Margaretha Sheffield, MD;  Location: [facility] ORS;  Service: ENT;  Laterality: N/A;  . BACK SURGERY    . CATARACT EXTRACTION     left  . CERVICAL DISC SURGERY  1980's  . CHOLECYSTECTOMY    . NASOPHARYNGOSCOPY  01/05/2016   Procedure: NASOPHARYNGOSCOPY;  Surgeon: Margaretha Sheffield, MD;  Location: [facility] ORS;  Service: ENT;;  .  THYROID LOBECTOMY     partial, right lobe  . TOTAL ABDOMINAL HYSTERECTOMY W/ BILATERAL SALPINGOOPHORECTOMY      There were no vitals filed for this visit.  Subjective Assessment - 06/07/17 1208    Subjective  Pt reports she went to the hospital since previous session and states that she went because she felt she did not have any "energy." Pt reports she was told to drink water because she was dehydrated.  Pt reports that her back has been hurting and that she has been wearing back brace. Pt reports she has not been doing HEP. Pt reports her legs feel weak.    Pertinent History  PMH: A- fib, History of LBP(several years does not give exact date), gallbladder removal, history of neck pain, L2-5 Decompression    Limitations  Lifting;Standing;Walking    Currently in Pain?  Yes    Pain Score  -- pt did not report number    Pain Location  Back    Pain Orientation  Lower    Pain Descriptors / Indicators  Aching    Pain Type  Chronic pain    Pain Onset  More than a month ago    Pain Frequency  Intermittent  Treatment:   Therapeutic Exercise:   Standing hip abduction - 2x20 B to strengthen and increase endurance of the hip abductors.   Sit to stands without UE support - 1x15 to increase LE strength and endurance  Sit to stands with holding yellow ball 1x15   Forward stepping against black tubing -- x10 to increase hip and LE strength  Backward stepping against black tubing - x15  Standing hip extension -- 1x20 B to increase endurance of the hip extensors bilaterally  Heel taps off 4" step - 1x20 B to increase strength and endurance of the glute med   Pt demonstrates increased fatigue with exercise at end of session.  PT Education - 06/07/17 1210    Education provided  Yes    Education Details  form/technique with exercise    Person(s) Educated  Patient    Methods  Explanation;Demonstration;Tactile cues;Verbal cues    Comprehension  Verbalized understanding;Returned  demonstration;Tactile cues required          PT Long Term Goals - 05/03/17 1535      PT LONG TERM GOAL #1   Title  Patient will be independent with HEP to continue benefits of therapy after discharge.     Baseline  05/03/17: Requires frequent cueing on exercise performance and does not perform HEP regularly     Time  6    Period  Weeks    Status  On-going      PT LONG TERM GOAL #2   Title  Patient will be able to perform all household chores without increase in pain to improve functional capacity.    Baseline  Requires a sitting rest break after 71mins of activity; 05/03/17: Patient continues to require a sitting rest break after 45 min     Time  6    Period  Weeks    Status  On-going      PT LONG TERM GOAL #3   Title  Patient will improve MODI to under 20% to improve self perceived lumbar function and greater ability to perform social activities without increase in pain.     Baseline  58% MODI; 05/03/17: 38% MODI    Time  6    Period  Weeks    Status  On-going            Plan - 06/07/17 1211    Clinical Impression Statement  Pt demonstrates slight regression with back pain today possibly secondary to not performing HEP and reported dehydration and LE weakness from the past weekend .  Pt continues to require increased tactile cueing to perform hip extension exercise with proper technique, indicating decreased motor control of the hip and LE musculature. Pt will benefit from further skilled therapy to return to prior levels of function.    Rehab Potential  Good    Clinical Impairments Affecting Rehab Potential  (+) highly motivated; (-)     PT Frequency  2x / week    PT Duration  6 weeks    PT Treatment/Interventions  Passive range of motion;Manual techniques;Cryotherapy;Electrical Stimulation;Moist Heat;Ultrasound;Iontophoresis 4mg /ml Dexamethasone;Gait training;Therapeutic activities;Therapeutic exercise;Patient/family education;Neuromuscular re-education;Balance training;Dry  needling    PT Next Visit Plan  Improve strength, endurance, and motor control    PT Home Exercise Plan  See education    Consulted and Agree with Plan of Care  Patient       Patient will benefit from skilled therapeutic intervention in order to improve the following deficits and impairments:  Decreased activity tolerance, Decreased endurance, Decreased  range of motion, Decreased strength, Difficulty walking, Decreased mobility, Increased muscle spasms, Postural dysfunction, Decreased coordination, Pain, Impaired sensation, Increased fascial restricitons  Visit Diagnosis: Chronic midline low back pain without sciatica  Muscle weakness (generalized)     Problem List Patient Active Problem List   Diagnosis Date Noted  . B12 deficiency 09/18/2016  . Right hip pain 08/05/2015  . Anemia 08/05/2015  . Vitamin D deficiency 08/05/2015  . Hemoptysis 08/04/2015  . Constipation 02/04/2015  . Left knee pain 01/05/2015  . Status post laminectomy with spinal fusion 02/23/2014  . Fatigue 07/07/2013  . Dyspnea on exertion 08/12/2012  . Chronic lower back pain 08/12/2012  . Urge urinary incontinence 08/12/2012  . Personal history of colonic polyps 08/12/2012  . Seasonal rhinitis 01/16/2012  . Hiatal hernia with gastroesophageal reflux   . Fatigue 09/16/2011  . Controlled atrial fibrillation ([place]) 12/15/2010  . Irritable bowel syndrome without diarrhea 12/15/2010  . Hypothyroid 12/15/2010  . Long term current use of anticoagulant therapy 12/15/2010    Ricard Dillon, SPT 06/07/2017, 12:16 PM  [facility] [address] [place], [address] Phone: [phone]   Fax:  [phone]  Name: Alexandra Martinez MRN: [id number] Date of Birth: 06-18-1929

## 2017-06-09 ENCOUNTER — Ambulatory Visit: Payer: Medicare Other

## 2017-06-13 ENCOUNTER — Encounter: Payer: Self-pay | Admitting: Internal Medicine

## 2017-06-13 ENCOUNTER — Ambulatory Visit (INDEPENDENT_AMBULATORY_CARE_PROVIDER_SITE_OTHER): Payer: Medicare Other | Admitting: Internal Medicine

## 2017-06-13 VITALS — BP 158/76 | HR 81 | Temp 97.7°F | Resp 15 | Ht 64.0 in | Wt 152.0 lb

## 2017-06-13 DIAGNOSIS — E538 Deficiency of other specified B group vitamins: Secondary | ICD-10-CM | POA: Diagnosis not present

## 2017-06-13 DIAGNOSIS — R5383 Other fatigue: Secondary | ICD-10-CM

## 2017-06-13 DIAGNOSIS — E039 Hypothyroidism, unspecified: Secondary | ICD-10-CM

## 2017-06-13 DIAGNOSIS — D519 Vitamin B12 deficiency anemia, unspecified: Secondary | ICD-10-CM | POA: Diagnosis not present

## 2017-06-13 DIAGNOSIS — M545 Low back pain: Secondary | ICD-10-CM

## 2017-06-13 DIAGNOSIS — G8929 Other chronic pain: Secondary | ICD-10-CM

## 2017-06-13 DIAGNOSIS — I10 Essential (primary) hypertension: Secondary | ICD-10-CM | POA: Diagnosis not present

## 2017-06-13 DIAGNOSIS — R4189 Other symptoms and signs involving cognitive functions and awareness: Secondary | ICD-10-CM | POA: Diagnosis not present

## 2017-06-13 DIAGNOSIS — I4891 Unspecified atrial fibrillation: Secondary | ICD-10-CM | POA: Diagnosis not present

## 2017-06-13 DIAGNOSIS — R29818 Other symptoms and signs involving the nervous system: Secondary | ICD-10-CM | POA: Diagnosis not present

## 2017-06-13 DIAGNOSIS — Z7901 Long term (current) use of anticoagulants: Secondary | ICD-10-CM

## 2017-06-13 LAB — TSH: TSH: 4.54 u[IU]/mL — ABNORMAL HIGH (ref 0.35–4.50)

## 2017-06-13 LAB — VITAMIN B12: Vitamin B-12: 220 pg/mL (ref 211–911)

## 2017-06-13 MED ORDER — CYANOCOBALAMIN 1000 MCG/ML IJ SOLN
1000.0000 ug | Freq: Once | INTRAMUSCULAR | Status: AC
  Administered 2017-06-13: 1000 ug via INTRAMUSCULAR

## 2017-06-13 NOTE — Progress Notes (Signed)
Subjective:  [name], female    DOB: [birth year]  Age: 84 y.o. MRN: [id number]  CC: The primary encounter diagnosis was B12 deficiency. Diagnoses of Cognitive changes, Chronic bilateral low back pain without sciatica, Fatigue, unspecified type, Acquired hypothyroidism, Long term current use of anticoagulant therapy, Controlled atrial fibrillation ([place]), Anemia due to vitamin B12 deficiency, unspecified B12 deficiency type, Cognitive decline, and Essential hypertension were also pertinent to this visit.  HPI Alexandra Martinez presents for Er follow up.  Patient was treated on Feb 15   For light headedness which she reported upon waking.n  She was seen initially on Feb 15 at [facility] for acute weakness  Described as fatigue and LH and endorsed feeling  Jittery and dizzy. Because of her history of atrial fibrillation she was  referred to ER .     ED record reviewed,  VS were normal ,   EKG was  Sinus rhythm,ion.,  chest x ray suggested mild pulmonary vascular congestion.  UA was negative for signs of infection. She states she was there several hours and requested to go home   Patient is accompanied by her niece [name] who delivered a handwritten letter prior to the visit to voice her concerns about the cognitive decline  That she and other family memebrs have noticedin patient  over the last 6 months.  Forgetting appointments,  Word finding difficult (eg "bathroom") and visual hallucinations (patient lives alone and has told family that she has woken up several times and found strange people in her home) Her attempts to discuss with patient have been repeatedly rebuffed. noticed including forgetfulness and hallucinations .  Patient lives alone,  Family  checks in on her  But she continues to drive herself to the grocery,  [place] office etc "but I don't drive at night or when it is raining."   Patient was last evaluated by me in June 2018.  B12 level was <300 and she was  advised to set up weekly B12 injections at the office but never came.  She is able to recall some of the events of the recent ER visit but not all. She does agree that she has had some decline in memory and some difficulty with completion of household chores due to persistent back pain despite her recent lumbar decompression.     She admits that she relies on family members for assistance but refuses to appoint any of then as POA m and is adamant about staying in her private home.   She  Is oriented to month and day but not date or year.    Lab Results  Component Value Date   [id number] 595 06/13/2017      Outpatient Medications Prior to Visit  Medication Sig Dispense Refill  . acetaminophen (TYLENOL) 500 MG tablet Take 500 mg by mouth every 6 (six) hours as needed for headache.    . Esomeprazole Magnesium (NEXIUM PO) Take 20 mg by mouth as needed.     . furosemide (LASIX) 20 MG tablet Take by mouth.    . metoprolol tartrate (LOPRESSOR) 100 MG tablet TAKE 1 TABLET BY MOUTH TWICE DAILY 180 tablet 0  . ondansetron (ZOFRAN) 4 MG tablet TAKE 1 TABLET BY MOUTH EVERY 8 HOURS AS NEEDED FOR NAUSEA OR VOMITING 30 tablet 0  . polyethylene glycol powder (GLYCOLAX/MIRALAX) powder Take 17 g by mouth daily as needed for mild constipation. 3350 g 1  . pravastatin (PRAVACHOL) 20 MG tablet TAKE 1  TABLET(20 MG) BY MOUTH EVERY NIGHT 30 tablet 2  . Rivaroxaban (XARELTO) 15 MG TABS tablet Take 15 mg by mouth daily with supper.    . Simethicone (EQ GAS RELIEF) 125 MG CAPS Take 125 mg by mouth daily as needed (gas). Reported on 07/01/2015    . traMADol (ULTRAM) 50 MG tablet Take 1 tablet (50 mg total) by mouth every 8 (eight) hours as needed. 90 tablet 5  . levothyroxine (SYNTHROID, LEVOTHROID) 50 MCG tablet TAKE 1 TABLET BY MOUTH EVERY DAY 30 tablet 10  . losartan (COZAAR) 50 MG tablet     . metoprolol (LOPRESSOR) 100 MG tablet TAKE 1 TABLET BY MOUTH TWICE DAILY 180 tablet 0   No facility-administered  medications prior to visit.     Review of Systems;  Patient denies headache, fevers, malaise, unintentional weight loss, skin rash, eye pain, sinus congestion and sinus pain, sore throat, dysphagia,  hemoptysis , cough, dyspnea, wheezing, chest pain, palpitations, orthopnea, edema, abdominal pain, nausea, melena, diarrhea, constipation, flank pain, dysuria, hematuria, urinary  Frequency, nocturia, numbness, tingling, seizures,  Focal weakness, Loss of consciousness,  Tremor, insomnia, depression, anxiety, and suicidal ideation.      Objective:  BP (!) 158/76 (BP Location: Left Arm, Patient Position: Sitting, Cuff Size: Normal)   Pulse 81   Temp 97.7 F (36.5 C) (Oral)   Resp 15   Ht 5\' 4"  (1.626 m)   Wt 152 lb (68.9 kg)   SpO2 95%   BMI 26.09 kg/m   BP Readings from Last 3 Encounters:  06/13/17 (!) 158/76  06/03/17 (!) 147/70  11/30/16 128/64    Wt Readings from Last 3 Encounters:  06/13/17 152 lb (68.9 kg)  06/03/17 159 lb (72.1 kg)  11/30/16 159 lb (72.1 kg)    General appearance: alert, cooperative and appears stated age Ears: normal TM's and external ear canals both ears Throat: lips, mucosa, and tongue normal; teeth and gums normal Neck: no adenopathy, no carotid bruit, supple, symmetrical, trachea midline and thyroid not enlarged, symmetric, no tenderness/mass/nodules Back: symmetric, no curvature. ROM normal. No CVA tenderness. Lungs: clear to auscultation bilaterally Heart: regular rate and rhythm, S1, S2 normal, no murmur, click, rub or gallop Abdomen: soft, non-tender; bowel sounds normal; no masses,  no organomegaly Pulses: 2+ and symmetric Skin: Skin color, texture, turgor normal. No rashes or lesions Lymph nodes: Cervical, supraclavicular, and axillary nodes normal. Neuro:  awake and interactive with slightly depressed  mood and flat affect. Higher cortical functions are normal. Speech is clear with some  word-finding difficulty, no dysarthria. Extraocular  movements are intact. Visual fields of both eyes are grossly intact. Sensation to light touch is grossly intact bilaterally of upper and lower extremities. Motor examination shows 4+/5 symmetric hand grip and upper extremity and 5/5 lower extremity strength. There is no pronation or drift. Gait is non-ataxic    No results found for: HGBA1C  Lab Results  Component Value Date   CREATININE 1.10 (H) 06/03/2017   CREATININE 1.06 (H) 09/05/2016   CREATININE 1.02 05/24/2016    Lab Results  Component Value Date   WBC 5.9 06/03/2017   HGB 11.4 (L) 06/03/2017   HCT 34.4 (L) 06/03/2017   PLT 162 06/03/2017   GLUCOSE 105 (H) 06/03/2017   CHOL 217 (H) 07/16/2013   TRIG 88.0 07/16/2013   HDL 41.70 07/16/2013   LDLCALC 158 (H) 07/16/2013   ALT 12 (L) 09/05/2016   AST 22 09/05/2016   NA 139 06/03/2017   K 4.2  06/03/2017   CL 107 06/03/2017   CREATININE 1.10 (H) 06/03/2017   BUN 15 06/03/2017   CO2 25 06/03/2017   TSH 4.54 (H) 06/13/2017   INR 1.6 (H) 08/04/2015    Dg Chest 2 View  Result Date: 06/03/2017 CLINICAL DATA:  Dizziness and syncopal episode EXAM: CHEST  2 VIEW COMPARISON:  09/05/2016 FINDINGS: Cardiac shadow is enlarged. Aortic calcifications are again seen. Mild vascular congestion is noted. No focal infiltrate or sizable effusion is seen. No bony abnormality is noted. IMPRESSION: Mild vascular congestion without acute infiltrate. Electronically Signed   By: Inez Catalina M.D.   On: 06/03/2017 16:32    Assessment & Plan:   Problem List Items Addressed This Visit    Controlled atrial fibrillation ([place])    Rate controlled on metoprolol.  Most recent EKG suggests NSR       Relevant Medications   losartan (COZAAR) 50 MG tablet   Hypothyroid    Patient's thyroid function isunderactive on current levothyroxine dose, suggesting that seh has been forgetting to take her medication.  Principles of proper administration reviewed; patient states that has not missed more than one dose  in the last 6 weeks and is taking the medication alone on an empty stomach.  Will increase dose  To 75 mcg and repeat TSH in 6 weeks.   Lab Results  Component Value Date   TSH 4.54 (H) 06/13/2017        Relevant Medications   levothyroxine (SYNTHROID, LEVOTHROID) 75 MCG tablet   Long term current use of anticoagulant therapy    Continue Xarelto for mitigation of embolic risk  Lab Results  Component Value Date   WBC 5.9 06/03/2017   HGB 11.4 (L) 06/03/2017   HCT 34.4 (L) 06/03/2017   MCV 91.6 06/03/2017   PLT 162 06/03/2017         Fatigue    Chronic,  with B12 level < 300 last June.  Not replaced due to being lost to follow up level has dropped even more and a B12 injection was given today . MMA factor and intrisnic factor ab are pending  Lab Results  Component Value Date   VITAMINB12 220 06/13/2017         Chronic lower back pain    She continues to have chronic low back pain despite lumbar decompressive surgery that is limiting her activity.  She has had physical therapy with limited subjective improvement and is requiring more assistance from family to manage household. Suggested that she consider transition to assisted living      Anemia    Chronic , mild unchanged. Likely due to low B12 .  Will repeat iron stores and CBC after one month   Lab Results  Component Value Date   IRON 90 08/04/2015   TIBC 321 08/04/2015   FERRITIN 148.5 02/20/2014      Lab Results  Component Value Date   WBC 5.9 06/03/2017   HGB 11.4 (L) 06/03/2017   HCT 34.4 (L) 06/03/2017   MCV 91.6 06/03/2017   PLT 162 06/03/2017         Relevant Medications   cyanocobalamin ((VITAMIN B-12)) injection 1,000 mcg (Completed)   B12 deficiency - Primary   Relevant Medications   cyanocobalamin ((VITAMIN B-12)) injection 1,000 mcg (Completed)   Other Relevant Orders   Vitamin B12 (Completed)   Methylmalonic Acid   Intrinsic Factor Antibodies   Cognitive decline    With low B12 and  low thyroid,  May be  reversible,  But MRI brain advised to look for evidence supportig vascular dementia.  Neurology referral recommended as well      Essential hypertension    Elevated on last two readings,  Will resume losartan at 50 mg daily   Lab Results  Component Value Date   CREATININE 1.10 (H) 06/03/2017   Lab Results  Component Value Date   NA 139 06/03/2017   K 4.2 06/03/2017   CL 107 06/03/2017   CO2 25 06/03/2017         Relevant Medications   losartan (COZAAR) 50 MG tablet    Other Visit Diagnoses    Cognitive changes       Relevant Orders   TSH (Completed)   RPR    A total of 40 minutes was spent with patient more than half of which was spent in counseling patient on the above mentioned issues , reviewing and explaining recent labs and imaging studies done, and coordination of care.   I have changed Arlana Pouch. Bublitz's levothyroxine and losartan. I am also having her maintain her Simethicone, acetaminophen, Esomeprazole Magnesium (NEXIUM PO), polyethylene glycol powder, Rivaroxaban, furosemide, traMADol, ondansetron, pravastatin, and metoprolol tartrate. We administered cyanocobalamin.  Meds ordered this encounter  Medications  . cyanocobalamin ((VITAMIN B-12)) injection 1,000 mcg  . levothyroxine (SYNTHROID, LEVOTHROID) 75 MCG tablet    Sig: Take 1 tablet (75 mcg total) by mouth daily.    Dispense:  90 tablet    Refill:  0  . losartan (COZAAR) 50 MG tablet    Sig: Take 1 tablet (50 mg total) by mouth daily.    Dispense:  90 tablet    Refill:  0    Medications Discontinued During This Encounter  Medication Reason  . levothyroxine (SYNTHROID, LEVOTHROID) 50 MCG tablet   . metoprolol (LOPRESSOR) 100 MG tablet   . losartan (COZAAR) 50 MG tablet Reorder    Follow-up: Return in about 1 week (around 06/20/2017) for b12 level .   Crecencio Mc, MD

## 2017-06-13 NOTE — Patient Instructions (Addendum)
It was good to see you today  Your memory has declined since your last visit with me in June.  It may be due to your B12 deficiency.  We are rechecking it today    I would also  like you to have an MRI or your brain and an evaluation by a neurologist after you have the MRI   Please consider appointing [name] or one of your nephews Power of Attorney to help manage your financial decisions and any major health decisions that come up    Advance Directive Advance directives are legal documents that let you make choices ahead of time about your health care and medical treatment in case you become unable to communicate for yourself. Advance directives are a way for you to communicate your wishes to family, friends, and health care providers. This can help convey your decisions about end-of-life care if you become unable to communicate. Discussing and writing advance directives should happen over time rather than all at once. Advance directives can be changed depending on your situation and what you want, even after you have signed the advance directives. If you do not have an advance directive, some states assign family decision makers to act on your behalf based on how closely you are related to them. Each state has its own laws regarding advance directives. You may want to check with your health care provider, attorney, or state representative about the laws in your state. There are different types of advance directives, such as:  Medical power of attorney.  Living will.  Do not resuscitate (DNR) or do not attempt resuscitation (DNAR) order.  Health care proxy and medical power of attorney A health care proxy, also called a health care agent, is a person who is appointed to make medical decisions for you in cases in which you are unable to make the decisions yourself. Generally, people choose someone they know well and trust to represent their preferences. Make sure to ask this person for an  agreement to act as your proxy. A proxy may have to exercise judgment in the event of a medical decision for which your wishes are not known. A medical power of attorney is a legal document that names your health care proxy. Depending on the laws in your state, after the document is written, it may also need to be:  Signed.  Notarized.  Dated.  Copied.  Witnessed.  Incorporated into your medical record.  You may also want to appoint someone to manage your financial affairs in a situation in which you are unable to do so. This is called a durable power of attorney for finances. It is a separate legal document from the durable power of attorney for health care. You may choose the same person or someone different from your health care proxy to act as your agent in financial matters. If you do not appoint a proxy, or if there is a concern that the proxy is not acting in your best interests, a court-appointed guardian may be designated to act on your behalf. Living will A living will is a set of instructions documenting your wishes about medical care when you cannot express them yourself. Health care providers should keep a copy of your living will in your medical record. You may want to give a copy to family members or friends. To alert caregivers in case of an emergency, you can place a card in your wallet to let them know that you have a living will and  where they can find it. A living will is used if you become:  Terminally ill.  Incapacitated.  Unable to communicate or make decisions.  Items to consider in your living will include:  The use or non-use of life-sustaining equipment, such as dialysis machines and breathing machines (ventilators).  A DNR or DNAR order, which is the instruction not to use cardiopulmonary resuscitation (CPR) if breathing or heartbeat stops.  The use or non-use of tube feeding.  Withholding of food and fluids.  Comfort (palliative) care when the goal  becomes comfort rather than a cure.  Organ and tissue donation.  A living will does not give instructions for distributing your money and property if you should pass away. It is recommended that you seek the advice of a lawyer when writing a will. Decisions about taxes, beneficiaries, and asset distribution will be legally binding. This process can relieve your family and friends of any concerns surrounding disputes or questions that may come up about the distribution of your assets. DNR or DNAR A DNR or DNAR order is a request not to have CPR in the event that your heart stops beating or you stop breathing. If a DNR or DNAR order has not been made and shared, a health care provider will try to help any patient whose heart has stopped or who has stopped breathing. If you plan to have surgery, talk with your health care provider about how your DNR or DNAR order will be followed if problems occur. Summary  Advance directives are the legal documents that allow you to make choices ahead of time about your health care and medical treatment in case you become unable to communicate for yourself.  The process of discussing and writing advance directives should happen over time. You can change the advance directives, even after you have signed them.  Advance directives include DNR or DNAR orders, living wills, and designating an agent as your medical power of attorney. This information is not intended to replace advice given to you by your health care provider. Make sure you discuss any questions you have with your health care provider. Document Released: 07/13/2007 Document Revised: 02/23/2016 Document Reviewed: 02/23/2016 Elsevier Interactive Patient Education  2017 [employer].

## 2017-06-14 ENCOUNTER — Encounter: Payer: Self-pay | Admitting: Internal Medicine

## 2017-06-14 ENCOUNTER — Ambulatory Visit: Payer: Medicare Other

## 2017-06-14 DIAGNOSIS — I10 Essential (primary) hypertension: Secondary | ICD-10-CM | POA: Insufficient documentation

## 2017-06-14 DIAGNOSIS — F03B18 Unspecified dementia, moderate, with other behavioral disturbance: Secondary | ICD-10-CM | POA: Insufficient documentation

## 2017-06-14 DIAGNOSIS — F0391 Unspecified dementia with behavioral disturbance: Secondary | ICD-10-CM | POA: Insufficient documentation

## 2017-06-14 MED ORDER — LEVOTHYROXINE SODIUM 75 MCG PO TABS: 75.0000 ug | ORAL_TABLET | Freq: Every day | ORAL | 0 refills | Status: DC

## 2017-06-14 MED ORDER — LOSARTAN POTASSIUM 50 MG PO TABS: 50.0000 mg | ORAL_TABLET | Freq: Every day | ORAL | 0 refills | Status: DC

## 2017-06-14 NOTE — Assessment & Plan Note (Signed)
Continue Xarelto for mitigation of embolic risk  Lab Results  Component Value Date   WBC 5.9 06/03/2017   HGB 11.4 (L) 06/03/2017   HCT 34.4 (L) 06/03/2017   MCV 91.6 06/03/2017   PLT 162 06/03/2017

## 2017-06-14 NOTE — Assessment & Plan Note (Addendum)
Elevated on last two readings,  Will resume losartan at 50 mg daily   Lab Results  Component Value Date   CREATININE 1.10 (H) 06/03/2017   Lab Results  Component Value Date   NA 139 06/03/2017   K 4.2 06/03/2017   CL 107 06/03/2017   CO2 25 06/03/2017

## 2017-06-14 NOTE — Assessment & Plan Note (Signed)
With low B12 and low thyroid,  May be reversible,  But MRI brain advised to look for evidence supportig vascular dementia.  Neurology referral recommended as well

## 2017-06-14 NOTE — Assessment & Plan Note (Signed)
Patient's thyroid function isunderactive on current levothyroxine dose, suggesting that seh has been forgetting to take her medication.  Principles of proper administration reviewed; patient states that has not missed more than one dose in the last 6 weeks and is taking the medication alone on an empty stomach.  Will increase dose  To 75 mcg and repeat TSH in 6 weeks.   Lab Results  Component Value Date   TSH 4.54 (H) 06/13/2017

## 2017-06-14 NOTE — Assessment & Plan Note (Signed)
She continues to have chronic low back pain despite lumbar decompressive surgery that is limiting her activity.  She has had physical therapy with limited subjective improvement and is requiring more assistance from family to manage household. Suggested that she consider transition to assisted living

## 2017-06-14 NOTE — Assessment & Plan Note (Addendum)
Chronic , mild unchanged. Likely due to low B12 .  Will repeat iron stores and CBC after one month   Lab Results  Component Value Date   IRON 90 08/04/2015   TIBC 321 08/04/2015   FERRITIN 148.5 02/20/2014      Lab Results  Component Value Date   WBC 5.9 06/03/2017   HGB 11.4 (L) 06/03/2017   HCT 34.4 (L) 06/03/2017   MCV 91.6 06/03/2017   PLT 162 06/03/2017

## 2017-06-14 NOTE — Assessment & Plan Note (Signed)
Rate controlled on metoprolol.  Most recent EKG suggests NSR

## 2017-06-14 NOTE — Assessment & Plan Note (Signed)
Chronic,  with B12 level < 300 last June.  Not replaced due to being lost to follow up level has dropped even more and a B12 injection was given today . MMA factor and intrisnic factor ab are pending  Lab Results  Component Value Date   VITAMINB12 220 06/13/2017

## 2017-06-15 DIAGNOSIS — I10 Essential (primary) hypertension: Secondary | ICD-10-CM | POA: Diagnosis not present

## 2017-06-15 DIAGNOSIS — R42 Dizziness and giddiness: Secondary | ICD-10-CM | POA: Diagnosis not present

## 2017-06-15 DIAGNOSIS — E782 Mixed hyperlipidemia: Secondary | ICD-10-CM | POA: Diagnosis not present

## 2017-06-15 DIAGNOSIS — I482 Chronic atrial fibrillation: Secondary | ICD-10-CM | POA: Diagnosis not present

## 2017-06-16 ENCOUNTER — Ambulatory Visit: Payer: Medicare Other

## 2017-06-16 LAB — RPR: RPR Ser Ql: NONREACTIVE

## 2017-06-16 LAB — METHYLMALONIC ACID, SERUM: Methylmalonic Acid, Quant: 478 nmol/L — ABNORMAL HIGH (ref 87–318)

## 2017-06-16 LAB — INTRINSIC FACTOR ANTIBODIES: Intrinsic Factor: NEGATIVE

## 2017-06-18 ENCOUNTER — Other Ambulatory Visit: Payer: Self-pay | Admitting: Internal Medicine

## 2017-06-20 ENCOUNTER — Ambulatory Visit: Payer: Medicare Other | Attending: Neurosurgery

## 2017-06-20 DIAGNOSIS — M545 Low back pain, unspecified: Secondary | ICD-10-CM

## 2017-06-20 DIAGNOSIS — G8929 Other chronic pain: Secondary | ICD-10-CM | POA: Diagnosis not present

## 2017-06-20 DIAGNOSIS — M6281 Muscle weakness (generalized): Secondary | ICD-10-CM

## 2017-06-20 NOTE — Therapy (Signed)
[place] [facility] [address], [place], [address] Phone: [phone]   Fax:  [phone]  Physical Therapy Treatment  Patient Details  Name: Alexandra Martinez MRN: [id number] Date of Birth: 1930-01-01 Referring Provider: Jovita Gamma MD   Encounter Date: 06/20/2017  PT End of Session - 06/20/17 1556    Visit Number  17    Number of Visits  23    Date for PT Re-Evaluation  07/18/17    PT Start Time  1430    PT Stop Time  1515    PT Time Calculation (min)  45 min    Activity Tolerance  Patient tolerated treatment well    Behavior During Therapy  [facility] for tasks assessed/performed       Past Medical History:  Diagnosis Date  . Anemia    r/t uterine fibroids  . Atrial fib/flutter, transient    [facility] admission for RVR 03/2009  . DDD (degenerative disc disease)   . Dysrhythmia    A-FIB  . GERD (gastroesophageal reflux disease)   . H/O bladder infections   . H/O hiatal hernia   . H/O thyroid nodule    surgically removed  . Heart murmur    moderate MR/TR 08/2013 echo [name])  . Hemorrhoids   . Hiatal hernia with gastroesophageal reflux 2008   by EGD,  [name]  . High cholesterol   . History of uterine fibroid   . Hydronephrosis    uretero- with congenital UPJ obstruction- Dr. Edrick Oh  . Hydronephrosis with ureteropelvic junction obstruction   . Hypertension    [facility]  . Hypothyroidism   . Nocturia   . Osteoporosis   . Polyuria   . Recurrent UTI     Past Surgical History:  Procedure Laterality Date  . ADENOIDECTOMY N/A 01/05/2016   Procedure: ADENOIDECTOMY;  Surgeon: Margaretha Sheffield, MD;  Location: [facility] ORS;  Service: ENT;  Laterality: N/A;  . BACK SURGERY    . CATARACT EXTRACTION     left  . CERVICAL DISC SURGERY  1980's  . CHOLECYSTECTOMY    . NASOPHARYNGOSCOPY  01/05/2016   Procedure: NASOPHARYNGOSCOPY;  Surgeon: Margaretha Sheffield, MD;  Location: [facility] ORS;  Service: ENT;;  . THYROID  LOBECTOMY     partial, right lobe  . TOTAL ABDOMINAL HYSTERECTOMY W/ BILATERAL SALPINGOOPHORECTOMY      There were no vitals filed for this visit.  Subjective Assessment - 06/20/17 1554    Subjective  Patient reports she is feeling a bit better than she did the previous week and would like to participate in therapy. Patient reports she's been having more energy. Patient states she is at times able to perform vacuuming around her house for over an hour without requiring to sit down. Patient reports she feels her pain is overall improving.     Pertinent History  PMH: A- fib, History of LBP(several years does not give exact date), gallbladder removal, history of neck pain, L2-5 Decompression    Limitations  Lifting;Standing;Walking    Currently in Pain?  No/denies    Pain Onset  More than a month ago        TREATMENT: Therapeutic Exercise: Hip extension in standing - x15 with UE support Lifting small metal step - x12 with cueing to improve hip hinging Ball squeeze/glute squeeze - x 20 with cueing to activate glutes Hip abduction with glute squeeze with RTB around knees- x 20  Four square circles with RTB around knees -  x 17  Lumbar extension in standing - x15 with hands on his hips Heel taps from airex onto 6"step - x 15 B Patient demonstrates increased fatigue at the end of the session    PT Education - 06/20/17 1555    Education provided  Yes    Education Details  form/technique with exercise    Person(s) Educated  Patient    Methods  Explanation;Demonstration    Comprehension  Verbalized understanding;Returned demonstration          PT Long Term Goals - 06/20/17 1600      PT LONG TERM GOAL #1   Title  Patient will be independent with HEP to continue benefits of therapy after discharge.     Baseline  05/03/17: Requires frequent cueing on exercise performance and does not perform HEP regularly ; 06/20/17: Requires frequent cueing on exercise performance    Time  6    Period   Weeks    Status  On-going      PT LONG TERM GOAL #2   Title  Patient will be able to perform all household chores without increase in pain to improve functional capacity.    Baseline  Requires a sitting rest break after 32mins of activity; 05/03/17: Patient continues to require a sitting rest break after 45 min; 06/20/17: can vacuum for 1 hour before requiring a sitting rest break    Time  4    Period  Weeks    Status  On-going      PT LONG TERM GOAL #3   Title  Patient will improve MODI to under 20% to improve self perceived lumbar function and greater ability to perform social activities without increase in pain.     Baseline  58% MODI; 05/03/17: 38% MODI; 06/20/17 not performed on this date to time restriction    Time  6    Period  Weeks    Status  On-going            Plan - 06/20/17 1556    Clinical Impression Statement  Patient is making progress towards long term goals with ability to vacuum with longer periods of time without requiring a sitting break. Deferred performance of MODI secondary to time restrictions. Patient reports she continues to have difficulty with performing bending activities and focused on improving hip hinging with lifting objects from the floor today. Patient requires cueing throughout the movement to perform with proper technique. Patient is making improvement, however, continues to have difficulty with mopping and states this activity gives her the greatest amount of pain. Will continue to focus on improving bracing technique with exercise and standing activities to continue benefits of therapy.     Rehab Potential  Good    Clinical Impairments Affecting Rehab Potential  (+) highly motivated; (-)     PT Frequency  2x / week    PT Duration  6 weeks    PT Treatment/Interventions  Passive range of motion;Manual techniques;Cryotherapy;Electrical Stimulation;Moist Heat;Ultrasound;Iontophoresis 4mg /ml Dexamethasone;Gait training;Therapeutic activities;Therapeutic  exercise;Patient/family education;Neuromuscular re-education;Balance training;Dry needling    PT Next Visit Plan  Improve strength, endurance, and motor control    PT Home Exercise Plan  See education    Consulted and Agree with Plan of Care  Patient       Patient will benefit from skilled therapeutic intervention in order to improve the following deficits and impairments:  Decreased activity tolerance, Decreased endurance, Decreased range of motion, Decreased strength, Difficulty walking, Decreased mobility, Increased muscle spasms, Postural dysfunction, Decreased  coordination, Pain, Impaired sensation, Increased fascial restricitons  Visit Diagnosis: Muscle weakness (generalized) - Plan: PT plan of care cert/re-cert  Chronic midline low back pain without sciatica - Plan: PT plan of care cert/re-cert     Problem List Patient Active Problem List   Diagnosis Date Noted  . Cognitive decline 06/14/2017  . Essential hypertension 06/14/2017  . B12 deficiency 09/18/2016  . Right hip pain 08/05/2015  . Anemia 08/05/2015  . Vitamin D deficiency 08/05/2015  . Hemoptysis 08/04/2015  . Constipation 02/04/2015  . Left knee pain 01/05/2015  . Status post laminectomy with spinal fusion 02/23/2014  . Fatigue 07/07/2013  . Dyspnea on exertion 08/12/2012  . Chronic lower back pain 08/12/2012  . Urge urinary incontinence 08/12/2012  . Personal history of colonic polyps 08/12/2012  . Seasonal rhinitis 01/16/2012  . Hiatal hernia with gastroesophageal reflux   . Fatigue 09/16/2011  . Controlled atrial fibrillation ([place]) 12/15/2010  . Irritable bowel syndrome without diarrhea 12/15/2010  . Hypothyroid 12/15/2010  . Long term current use of anticoagulant therapy 12/15/2010    Blythe Stanford, PT DPT 06/20/2017, 4:02 PM  [place] [facility] [address] [place], [address] Phone: [phone]   Fax:  [phone]  Name: Alexandra Martinez MRN: [id number] Date of Birth: 01-24-1930

## 2017-06-21 ENCOUNTER — Encounter: Payer: Self-pay | Admitting: Neurology

## 2017-06-22 DIAGNOSIS — I482 Chronic atrial fibrillation: Secondary | ICD-10-CM | POA: Diagnosis not present

## 2017-06-23 ENCOUNTER — Ambulatory Visit: Payer: Medicare Other

## 2017-06-23 DIAGNOSIS — G8929 Other chronic pain: Secondary | ICD-10-CM

## 2017-06-23 DIAGNOSIS — M545 Low back pain: Secondary | ICD-10-CM | POA: Diagnosis not present

## 2017-06-23 DIAGNOSIS — M6281 Muscle weakness (generalized): Secondary | ICD-10-CM

## 2017-06-23 NOTE — Therapy (Signed)
[place] [facility] [address], [place], [address] Phone: [phone]   Fax:  [phone]  Physical Therapy Treatment  Patient Details  Name: Alexandra Martinez MRN: [id number] Date of Birth: 31-Aug-1929 Referring Provider: Jovita Gamma MD   Encounter Date: 06/23/2017  PT End of Session - 06/23/17 1632    Visit Number  19    Number of Visits  23    Date for PT Re-Evaluation  07/18/17    PT Start Time  1600    PT Stop Time  1645    PT Time Calculation (min)  45 min    Activity Tolerance  Patient tolerated treatment well    Behavior During Therapy  [facility] for tasks assessed/performed       Past Medical History:  Diagnosis Date  . Anemia    r/t uterine fibroids  . Atrial fib/flutter, transient    [facility] admission for RVR 03/2009  . DDD (degenerative disc disease)   . Dysrhythmia    A-FIB  . GERD (gastroesophageal reflux disease)   . H/O bladder infections   . H/O hiatal hernia   . H/O thyroid nodule    surgically removed  . Heart murmur    moderate MR/TR 08/2013 echo [name])  . Hemorrhoids   . Hiatal hernia with gastroesophageal reflux 2008   by EGD,  [name]  . High cholesterol   . History of uterine fibroid   . Hydronephrosis    uretero- with congenital UPJ obstruction- Dr. Edrick Oh  . Hydronephrosis with ureteropelvic junction obstruction   . Hypertension    [facility]  . Hypothyroidism   . Nocturia   . Osteoporosis   . Polyuria   . Recurrent UTI     Past Surgical History:  Procedure Laterality Date  . ADENOIDECTOMY N/A 01/05/2016   Procedure: ADENOIDECTOMY;  Surgeon: Margaretha Sheffield, MD;  Location: [facility] ORS;  Service: ENT;  Laterality: N/A;  . BACK SURGERY    . CATARACT EXTRACTION     left  . CERVICAL DISC SURGERY  1980's  . CHOLECYSTECTOMY    . NASOPHARYNGOSCOPY  01/05/2016   Procedure: NASOPHARYNGOSCOPY;  Surgeon: Margaretha Sheffield, MD;  Location: [facility] ORS;  Service: ENT;;  . THYROID  LOBECTOMY     partial, right lobe  . TOTAL ABDOMINAL HYSTERECTOMY W/ BILATERAL SALPINGOOPHORECTOMY      There were no vitals filed for this visit.  Subjective Assessment - 06/23/17 1622    Subjective  Patient reports she is "feeling alright" today and has no major complaints. Patient states she has not been performing her HEP at home but tries to do some exercises at home.     Pertinent History  PMH: A- fib, History of LBP(several years does not give exact date), gallbladder removal, history of neck pain, L2-5 Decompression    Limitations  Lifting;Standing;Walking    Currently in Pain?  No/denies    Pain Onset  More than a month ago       TREATMENT: Therapeutic Exercise: Hip abduction in standing at hip machine - 2 x 10 40# Walking backwards holding 7# weights B UE - 2 x 56ft Side stepping with holding 7# weights B UE - 2 x 40ft B Sit to stands with holding 5# weight and raising it overhead - 3 x 10  Standing chops with GTB - x20 B Standing Push off press with cable machine - x 20 B 5# Hip extension in standing with UE support - x20 B  Patient demonstrates increased fatigue at the end of the session   PT Education - 06/23/17 1631    Education provided  Yes    Education Details  form/technique with exercise    Person(s) Educated  Patient    Methods  Explanation;Demonstration    Comprehension  Verbalized understanding;Returned demonstration          PT Long Term Goals - 06/20/17 1600      PT LONG TERM GOAL #1   Title  Patient will be independent with HEP to continue benefits of therapy after discharge.     Baseline  05/03/17: Requires frequent cueing on exercise performance and does not perform HEP regularly ; 06/20/17: Requires frequent cueing on exercise performance    Time  6    Period  Weeks    Status  On-going      PT LONG TERM GOAL #2   Title  Patient will be able to perform all household chores without increase in pain to improve functional capacity.    Baseline   Requires a sitting rest break after 45mins of activity; 05/03/17: Patient continues to require a sitting rest break after 45 min; 06/20/17: can vacuum for 1 hour before requiring a sitting rest break    Time  4    Period  Weeks    Status  On-going      PT LONG TERM GOAL #3   Title  Patient will improve MODI to under 20% to improve self perceived lumbar function and greater ability to perform social activities without increase in pain.     Baseline  58% MODI; 05/03/17: 38% MODI; 06/20/17 not performed on this date to time restriction    Time  6    Period  Weeks    Status  On-going            Plan - 06/23/17 1638    Clinical Impression Statement  Patient continues to demonstrates increased fatigue with exercises but no increase in back pain indicating functional carryover between sessions. Although patient is improving, she continues to have increased pain with performing heacvy household chores such as mopping and vacuuming. Patient will benefit from further skilled therapy focused on improving these limitations to return to prior level of function.     Rehab Potential  Good    Clinical Impairments Affecting Rehab Potential  (+) highly motivated; (-)     PT Frequency  2x / week    PT Duration  6 weeks    PT Treatment/Interventions  Passive range of motion;Manual techniques;Cryotherapy;Electrical Stimulation;Moist Heat;Ultrasound;Iontophoresis 4mg /ml Dexamethasone;Gait training;Therapeutic activities;Therapeutic exercise;Patient/family education;Neuromuscular re-education;Balance training;Dry needling    PT Next Visit Plan  Improve strength, endurance, and motor control    PT Home Exercise Plan  See education    Consulted and Agree with Plan of Care  Patient       Patient will benefit from skilled therapeutic intervention in order to improve the following deficits and impairments:  Decreased activity tolerance, Decreased endurance, Decreased range of motion, Decreased strength, Difficulty  walking, Decreased mobility, Increased muscle spasms, Postural dysfunction, Decreased coordination, Pain, Impaired sensation, Increased fascial restricitons  Visit Diagnosis: Muscle weakness (generalized)  Chronic midline low back pain without sciatica     Problem List Patient Active Problem List   Diagnosis Date Noted  . Cognitive decline 06/14/2017  . Essential hypertension 06/14/2017  . B12 deficiency 09/18/2016  . Right hip pain 08/05/2015  . Anemia 08/05/2015  . Vitamin D deficiency 08/05/2015  . Hemoptysis 08/04/2015  .  Constipation 02/04/2015  . Left knee pain 01/05/2015  . Status post laminectomy with spinal fusion 02/23/2014  . Fatigue 07/07/2013  . Dyspnea on exertion 08/12/2012  . Chronic lower back pain 08/12/2012  . Urge urinary incontinence 08/12/2012  . Personal history of colonic polyps 08/12/2012  . Seasonal rhinitis 01/16/2012  . Hiatal hernia with gastroesophageal reflux   . Fatigue 09/16/2011  . Controlled atrial fibrillation ([place]) 12/15/2010  . Irritable bowel syndrome without diarrhea 12/15/2010  . Hypothyroid 12/15/2010  . Long term current use of anticoagulant therapy 12/15/2010    Blythe Stanford, PT DPT 06/23/2017, 5:10 PM  [place] [facility] [address], [place], [address] Phone: [phone]   Fax:  [phone]  Name: Alexandra Martinez MRN: [id number] Date of Birth: 06/12/1929

## 2017-06-24 ENCOUNTER — Ambulatory Visit
Admission: RE | Admit: 2017-06-24 | Discharge: 2017-06-24 | Disposition: A | Discharge status: Home / Self Care | Payer: Medicare Other | Source: Ambulatory Visit | Attending: Internal Medicine | Admitting: Internal Medicine

## 2017-06-24 DIAGNOSIS — R4189 Other symptoms and signs involving cognitive functions and awareness: Secondary | ICD-10-CM | POA: Insufficient documentation

## 2017-06-24 DIAGNOSIS — R29818 Other symptoms and signs involving the nervous system: Secondary | ICD-10-CM | POA: Insufficient documentation

## 2017-06-24 DIAGNOSIS — G319 Degenerative disease of nervous system, unspecified: Secondary | ICD-10-CM | POA: Diagnosis not present

## 2017-06-24 DIAGNOSIS — I998 Other disorder of circulatory system: Secondary | ICD-10-CM | POA: Insufficient documentation

## 2017-06-24 DIAGNOSIS — G3184 Mild cognitive impairment, so stated: Secondary | ICD-10-CM | POA: Diagnosis not present

## 2017-06-26 ENCOUNTER — Other Ambulatory Visit: Payer: Self-pay | Admitting: Internal Medicine

## 2017-06-28 ENCOUNTER — Ambulatory Visit: Payer: Medicare Other

## 2017-06-28 DIAGNOSIS — M6281 Muscle weakness (generalized): Secondary | ICD-10-CM | POA: Diagnosis not present

## 2017-06-28 DIAGNOSIS — R42 Dizziness and giddiness: Secondary | ICD-10-CM | POA: Diagnosis not present

## 2017-06-28 DIAGNOSIS — Z7901 Long term (current) use of anticoagulants: Secondary | ICD-10-CM | POA: Diagnosis not present

## 2017-06-28 DIAGNOSIS — I872 Venous insufficiency (chronic) (peripheral): Secondary | ICD-10-CM | POA: Diagnosis not present

## 2017-06-28 DIAGNOSIS — G8929 Other chronic pain: Secondary | ICD-10-CM

## 2017-06-28 DIAGNOSIS — I38 Endocarditis, valve unspecified: Secondary | ICD-10-CM | POA: Diagnosis not present

## 2017-06-28 DIAGNOSIS — I482 Chronic atrial fibrillation: Secondary | ICD-10-CM | POA: Diagnosis not present

## 2017-06-28 DIAGNOSIS — N183 Chronic kidney disease, stage 3 (moderate): Secondary | ICD-10-CM | POA: Diagnosis not present

## 2017-06-28 DIAGNOSIS — E782 Mixed hyperlipidemia: Secondary | ICD-10-CM | POA: Diagnosis not present

## 2017-06-28 DIAGNOSIS — R5383 Other fatigue: Secondary | ICD-10-CM | POA: Diagnosis not present

## 2017-06-28 DIAGNOSIS — I10 Essential (primary) hypertension: Secondary | ICD-10-CM | POA: Diagnosis not present

## 2017-06-28 DIAGNOSIS — M545 Low back pain: Secondary | ICD-10-CM | POA: Diagnosis not present

## 2017-06-28 NOTE — Therapy (Signed)
[place] [facility] [address], [place], [address] Phone: [phone]   Fax:  [phone]  Physical Therapy Treatment  Patient Details  Name: Alexandra Martinez MRN: [id number] Date of Birth: [birth year] Referring Provider: Jovita Gamma MD   Encounter Date: 06/28/2017  PT End of Session - 06/28/17 1559    Visit Number  20    Number of Visits  23    Date for PT Re-Evaluation  07/18/17    PT Start Time  2585    PT Stop Time  1600    PT Time Calculation (min)  45 min    Activity Tolerance  Patient tolerated treatment well    Behavior During Therapy  [facility] for tasks assessed/performed       Past Medical History:  Diagnosis Date  . Anemia    r/t uterine fibroids  . Atrial fib/flutter, transient    [facility] admission for RVR 03/2009  . DDD (degenerative disc disease)   . Dysrhythmia    A-FIB  . GERD (gastroesophageal reflux disease)   . H/O bladder infections   . H/O hiatal hernia   . H/O thyroid nodule    surgically removed  . Heart murmur    moderate MR/TR 08/2013 echo [name])  . Hemorrhoids   . Hiatal hernia with gastroesophageal reflux 2008   by EGD,  [name]  . High cholesterol   . History of uterine fibroid   . Hydronephrosis    uretero- with congenital UPJ obstruction- Dr. Edrick Oh  . Hydronephrosis with ureteropelvic junction obstruction   . Hypertension    [facility]  . Hypothyroidism   . Nocturia   . Osteoporosis   . Polyuria   . Recurrent UTI     Past Surgical History:  Procedure Laterality Date  . ADENOIDECTOMY N/A 01/05/2016   Procedure: ADENOIDECTOMY;  Surgeon: Margaretha Sheffield, MD;  Location: [facility] ORS;  Service: ENT;  Laterality: N/A;  . BACK SURGERY    . CATARACT EXTRACTION     left  . CERVICAL DISC SURGERY  1980's  . CHOLECYSTECTOMY    . NASOPHARYNGOSCOPY  01/05/2016   Procedure: NASOPHARYNGOSCOPY;  Surgeon: Margaretha Sheffield, MD;  Location: [facility] ORS;  Service: ENT;;  .  THYROID LOBECTOMY     partial, right lobe  . TOTAL ABDOMINAL HYSTERECTOMY W/ BILATERAL SALPINGOOPHORECTOMY      There were no vitals filed for this visit.  Subjective Assessment - 06/28/17 1555    Subjective  Patient reports shes been doing okay and reports no major changges snce the previous session. Patient repports wearing her lumbar brace less.    Pertinent History  PMH: A- fib, History of LBP(several years does not give exact date), gallbladder removal, history of neck pain, L2-5 Decompression    Limitations  Lifting;Standing;Walking    Currently in Pain?  No/denies    Pain Onset  More than a month ago        -- TREATMENT Therapeutic Exercise Nustep level 3,  6 min with focus on speed with performance; level 5 with focus on improving speed Hip machine hip abduction - x 20 40# Hip extension with YTB - x20 in standing  Sit to stands with arms overhead - 2 x 15  Resisted walk outs with black tubing - backwards/forward/sideways B x5 Patient demonstrates increased fatigue at end of the session      PT Education - 06/28/17 1558    Education provided  Yes    Education Details  form/technique with exercise    Person(s) Educated  Patient    Methods  Explanation;Demonstration    Comprehension  Verbalized understanding;Returned demonstration          PT Long Term Goals - 06/20/17 1600      PT LONG TERM GOAL #1   Title  Patient will be independent with HEP to continue benefits of therapy after discharge.     Baseline  05/03/17: Requires frequent cueing on exercise performance and does not perform HEP regularly ; 06/20/17: Requires frequent cueing on exercise performance    Time  6    Period  Weeks    Status  On-going      PT LONG TERM GOAL #2   Title  Patient will be able to perform all household chores without increase in pain to improve functional capacity.    Baseline  Requires a sitting rest break after 22mins of activity; 05/03/17: Patient continues to require a  sitting rest break after 45 min; 06/20/17: can vacuum for 1 hour before requiring a sitting rest break    Time  4    Period  Weeks    Status  On-going      PT LONG TERM GOAL #3   Title  Patient will improve MODI to under 20% to improve self perceived lumbar function and greater ability to perform social activities without increase in pain.     Baseline  58% MODI; 05/03/17: 38% MODI; 06/20/17 not performed on this date to time restriction    Time  6    Period  Weeks    Status  On-going            Plan - 06/28/17 1603    Clinical Impression Statement  Patient demonstrates improvement with exercise performance with ability to perform exercises with decreased pain and spasms along her lumbar spine with exercises requiring minimal rest breaks for exercises. Although patient is improving, she continues to have increased low back pain with performing functional activities around her house and will benefit from further skilled therapy to return to prior level of function.      Rehab Potential  Good    Clinical Impairments Affecting Rehab Potential  (+) highly motivated; (-)     PT Frequency  2x / week    PT Duration  6 weeks    PT Treatment/Interventions  Passive range of motion;Manual techniques;Cryotherapy;Electrical Stimulation;Moist Heat;Ultrasound;Iontophoresis 4mg /ml Dexamethasone;Gait training;Therapeutic activities;Therapeutic exercise;Patient/family education;Neuromuscular re-education;Balance training;Dry needling    PT Next Visit Plan  Improve strength, endurance, and motor control    PT Home Exercise Plan  See education    Consulted and Agree with Plan of Care  Patient       Patient will benefit from skilled therapeutic intervention in order to improve the following deficits and impairments:  Decreased activity tolerance, Decreased endurance, Decreased range of motion, Decreased strength, Difficulty walking, Decreased mobility, Increased muscle spasms, Postural dysfunction, Decreased  coordination, Pain, Impaired sensation, Increased fascial restricitons  Visit Diagnosis: Chronic midline low back pain without sciatica  Muscle weakness (generalized)     Problem List Patient Active Problem List   Diagnosis Date Noted  . Cognitive decline 06/14/2017  . Essential hypertension 06/14/2017  . B12 deficiency 09/18/2016  . Right hip pain 08/05/2015  . Anemia 08/05/2015  . Vitamin D deficiency 08/05/2015  . Hemoptysis 08/04/2015  . Constipation 02/04/2015  . Left knee pain 01/05/2015  . Status post laminectomy with spinal fusion 02/23/2014  . Fatigue 07/07/2013  . Dyspnea on  exertion 08/12/2012  . Chronic lower back pain 08/12/2012  . Urge urinary incontinence 08/12/2012  . Personal history of colonic polyps 08/12/2012  . Seasonal rhinitis 01/16/2012  . Hiatal hernia with gastroesophageal reflux   . Fatigue 09/16/2011  . Controlled atrial fibrillation ([place]) 12/15/2010  . Irritable bowel syndrome without diarrhea 12/15/2010  . Hypothyroid 12/15/2010  . Long term current use of anticoagulant therapy 12/15/2010    Blythe Stanford, PT DPT 06/28/2017, 5:16 PM  [place] [facility] [address], [place], [address] Phone: [phone]   Fax:  [phone]  Name: Alexandra Martinez MRN: [id number] Date of Birth: Jun 26, 1929

## 2017-06-29 ENCOUNTER — Ambulatory Visit: Payer: Medicare Other

## 2017-06-29 ENCOUNTER — Ambulatory Visit (INDEPENDENT_AMBULATORY_CARE_PROVIDER_SITE_OTHER): Payer: Medicare Other

## 2017-06-29 DIAGNOSIS — G8929 Other chronic pain: Secondary | ICD-10-CM

## 2017-06-29 DIAGNOSIS — M6281 Muscle weakness (generalized): Secondary | ICD-10-CM

## 2017-06-29 DIAGNOSIS — E538 Deficiency of other specified B group vitamins: Secondary | ICD-10-CM

## 2017-06-29 DIAGNOSIS — M545 Low back pain: Principal | ICD-10-CM

## 2017-06-29 NOTE — Therapy (Signed)
[facility] [address], [place], [address] Phone: [phone]   Fax:  [phone]  Physical Therapy Treatment  Patient Details  Name: Alexandra Martinez MRN: [id number] Date of Birth: [birth year] Referring Provider: Jovita Gamma MD   Encounter Date: 06/29/2017  PT End of Session - 06/29/17 1106    Visit Number  21    Number of Visits  32    Date for PT Re-Evaluation  07/18/17    PT Start Time  8144    PT Stop Time  1115    PT Time Calculation (min)  40 min    Activity Tolerance  Patient tolerated treatment well    Behavior During Therapy  [facility] for tasks assessed/performed       Past Medical History:  Diagnosis Date  . Anemia    r/t uterine fibroids  . Atrial fib/flutter, transient    [facility] admission for RVR 03/2009  . DDD (degenerative disc disease)   . Dysrhythmia    A-FIB  . GERD (gastroesophageal reflux disease)   . H/O bladder infections   . H/O hiatal hernia   . H/O thyroid nodule    surgically removed  . Heart murmur    moderate MR/TR 08/2013 echo [name])  . Hemorrhoids   . Hiatal hernia with gastroesophageal reflux 2008   by EGD,  [name]  . High cholesterol   . History of uterine fibroid   . Hydronephrosis    uretero- with congenital UPJ obstruction- Dr. Edrick Oh  . Hydronephrosis with ureteropelvic junction obstruction   . Hypertension    [facility]  . Hypothyroidism   . Nocturia   . Osteoporosis   . Polyuria   . Recurrent UTI     Past Surgical History:  Procedure Laterality Date  . ADENOIDECTOMY N/A 01/05/2016   Procedure: ADENOIDECTOMY;  Surgeon: Margaretha Sheffield, MD;  Location: [facility] ORS;  Service: ENT;  Laterality: N/A;  . BACK SURGERY    . CATARACT EXTRACTION     left  . CERVICAL DISC SURGERY  1980's  . CHOLECYSTECTOMY    . NASOPHARYNGOSCOPY  01/05/2016   Procedure: NASOPHARYNGOSCOPY;  Surgeon: Margaretha Sheffield, MD;  Location: [facility] ORS;  Service: ENT;;  .  THYROID LOBECTOMY     partial, right lobe  . TOTAL ABDOMINAL HYSTERECTOMY W/ BILATERAL SALPINGOOPHORECTOMY      There were no vitals filed for this visit.  Subjective Assessment - 06/29/17 1050    Subjective  Patient reports no major changes since yesterday. Patient reports no current increase in soreness. Patient reports she does not remember coming to therapy yesterday.     Pertinent History  PMH: A- fib, History of LBP(several years does not give exact date), gallbladder removal, history of neck pain, L2-5 Decompression    Limitations  Lifting;Standing;Walking    Currently in Pain?  No/denies    Pain Onset  More than a month ago       TREATMENT Therapeutic Exercise Nustep level 5, 8 min with focus on speed with performance; level 5 with focus on improving speed Backwards stepping with focus on taking large steps - 6 x 89ft down and back  Tandem ambulation in standing -  8 x 73ft Sit to stands with arms overhead - 2 x 8 with 2kg ball  Farmers carry with 10# weight unilaterally - 35ftx 12  Patient demonstrates increased fatigue at end of the session   PT Education - 06/29/17 1105    Education provided  Yes    Education Details  form/technique with exercise     Person(s) Educated  Patient    Methods  Explanation;Demonstration    Comprehension  Verbalized understanding;Returned demonstration          PT Long Term Goals - 06/20/17 1600      PT LONG TERM GOAL #1   Title  Patient will be independent with HEP to continue benefits of therapy after discharge.     Baseline  05/03/17: Requires frequent cueing on exercise performance and does not perform HEP regularly ; 06/20/17: Requires frequent cueing on exercise performance    Time  6    Period  Weeks    Status  On-going      PT LONG TERM GOAL #2   Title  Patient will be able to perform all household chores without increase in pain to improve functional capacity.    Baseline  Requires a sitting rest break after 76mins of  activity; 05/03/17: Patient continues to require a sitting rest break after 45 min; 06/20/17: can vacuum for 1 hour before requiring a sitting rest break    Time  4    Period  Weeks    Status  On-going      PT LONG TERM GOAL #3   Title  Patient will improve MODI to under 20% to improve self perceived lumbar function and greater ability to perform social activities without increase in pain.     Baseline  58% MODI; 05/03/17: 38% MODI; 06/20/17 not performed on this date to time restriction    Time  6    Period  Weeks    Status  On-going            Plan - 06/29/17 1107    Clinical Impression Statement  Patient demonstrates increased low back pain with performance of farmers carry exercise most likely due to increased lumbar flexion with fatigue and mobility. Patient demonstrates no increased pain at end of session but increased fatigue. Patient will benefit from further skilled therapy focused on decreasing pain to return to prior level of function.     Rehab Potential  Good    Clinical Impairments Affecting Rehab Potential  (+) highly motivated; (-)     PT Frequency  2x / week    PT Duration  6 weeks    PT Treatment/Interventions  Passive range of motion;Manual techniques;Cryotherapy;Electrical Stimulation;Moist Heat;Ultrasound;Iontophoresis 4mg /ml Dexamethasone;Gait training;Therapeutic activities;Therapeutic exercise;Patient/family education;Neuromuscular re-education;Balance training;Dry needling    PT Next Visit Plan  Improve strength, endurance, and motor control    PT Home Exercise Plan  See education    Consulted and Agree with Plan of Care  Patient       Patient will benefit from skilled therapeutic intervention in order to improve the following deficits and impairments:  Decreased activity tolerance, Decreased endurance, Decreased range of motion, Decreased strength, Difficulty walking, Decreased mobility, Increased muscle spasms, Postural dysfunction, Decreased coordination, Pain,  Impaired sensation, Increased fascial restricitons  Visit Diagnosis: Chronic midline low back pain without sciatica  Muscle weakness (generalized)     Problem List Patient Active Problem List   Diagnosis Date Noted  . Cognitive decline 06/14/2017  . Essential hypertension 06/14/2017  . B12 deficiency 09/18/2016  . Right hip pain 08/05/2015  . Anemia 08/05/2015  . Vitamin D deficiency 08/05/2015  . Hemoptysis 08/04/2015  . Constipation 02/04/2015  . Left knee pain 01/05/2015  . Status post laminectomy with spinal fusion 02/23/2014  . Fatigue 07/07/2013  . Dyspnea on exertion  08/12/2012  . Chronic lower back pain 08/12/2012  . Urge urinary incontinence 08/12/2012  . Personal history of colonic polyps 08/12/2012  . Seasonal rhinitis 01/16/2012  . Hiatal hernia with gastroesophageal reflux   . Fatigue 09/16/2011  . Controlled atrial fibrillation ([place]) 12/15/2010  . Irritable bowel syndrome without diarrhea 12/15/2010  . Hypothyroid 12/15/2010  . Long term current use of anticoagulant therapy 12/15/2010    Blythe Stanford, PT DPT 06/29/2017, 11:21 AM  [facility] [address] [place], [address] Phone: [phone]   Fax:  [phone]  Name: CLAIRA JETER MRN: [id number] Date of Birth: 1930-02-21

## 2017-06-30 DIAGNOSIS — E538 Deficiency of other specified B group vitamins: Secondary | ICD-10-CM | POA: Diagnosis not present

## 2017-06-30 MED ORDER — CYANOCOBALAMIN 1000 MCG/ML IJ SOLN
1000.0000 ug | Freq: Once | INTRAMUSCULAR | Status: AC
  Administered 2017-06-30: 1000 ug via INTRAMUSCULAR

## 2017-06-30 NOTE — Progress Notes (Signed)
Patient comes in for B 12 injection.  Injected left deltoid.  Patient tolerated injection well.   Reviewed.  Dr Scott 

## 2017-07-05 ENCOUNTER — Ambulatory Visit: Payer: Medicare Other

## 2017-07-06 ENCOUNTER — Ambulatory Visit (INDEPENDENT_AMBULATORY_CARE_PROVIDER_SITE_OTHER): Payer: Medicare Other

## 2017-07-06 DIAGNOSIS — E538 Deficiency of other specified B group vitamins: Secondary | ICD-10-CM

## 2017-07-06 MED ORDER — CYANOCOBALAMIN 1000 MCG/ML IJ SOLN: 1000.0000 ug | Freq: Once | INTRAMUSCULAR | Status: DC

## 2017-07-06 NOTE — Progress Notes (Signed)
Patient comes in for B 12 injection.  Injected Right  deltoid.  Patient tolerated injection well.

## 2017-07-07 ENCOUNTER — Ambulatory Visit: Payer: Medicare Other

## 2017-07-07 DIAGNOSIS — G8929 Other chronic pain: Secondary | ICD-10-CM | POA: Diagnosis not present

## 2017-07-07 DIAGNOSIS — M545 Low back pain: Principal | ICD-10-CM

## 2017-07-07 DIAGNOSIS — M6281 Muscle weakness (generalized): Secondary | ICD-10-CM

## 2017-07-07 NOTE — Therapy (Signed)
[facility] [address] [place], [address] Phone: [phone]   Fax:  [phone]  Physical Therapy Treatment  Patient Details  Name: Alexandra Martinez MRN: [id number] Date of Birth: [birth year] Referring Provider: Jovita Gamma MD   Encounter Date: 07/07/2017  PT End of Session - 07/07/17 1531    Visit Number  22    Number of Visits  32    Date for PT Re-Evaluation  07/18/17    PT Start Time  0272    PT Stop Time  1600    PT Time Calculation (min)  45 min    Activity Tolerance  Patient tolerated treatment well    Behavior During Therapy  [facility] for tasks assessed/performed       Past Medical History:  Diagnosis Date  . Anemia    r/t uterine fibroids  . Atrial fib/flutter, transient    [facility] admission for RVR 03/2009  . DDD (degenerative disc disease)   . Dysrhythmia    A-FIB  . GERD (gastroesophageal reflux disease)   . H/O bladder infections   . H/O hiatal hernia   . H/O thyroid nodule    surgically removed  . Heart murmur    moderate MR/TR 08/2013 echo [name])  . Hemorrhoids   . Hiatal hernia with gastroesophageal reflux 2008   by EGD,  [name]  . High cholesterol   . History of uterine fibroid   . Hydronephrosis    uretero- with congenital UPJ obstruction- Dr. Edrick Oh  . Hydronephrosis with ureteropelvic junction obstruction   . Hypertension    [facility]  . Hypothyroidism   . Nocturia   . Osteoporosis   . Polyuria   . Recurrent UTI     Past Surgical History:  Procedure Laterality Date  . ADENOIDECTOMY N/A 01/05/2016   Procedure: ADENOIDECTOMY;  Surgeon: Margaretha Sheffield, MD;  Location: [facility] ORS;  Service: ENT;  Laterality: N/A;  . BACK SURGERY    . CATARACT EXTRACTION     left  . CERVICAL DISC SURGERY  1980's  . CHOLECYSTECTOMY    . NASOPHARYNGOSCOPY  01/05/2016   Procedure: NASOPHARYNGOSCOPY;  Surgeon: Margaretha Sheffield, MD;  Location: [facility] ORS;  Service: ENT;;  .  THYROID LOBECTOMY     partial, right lobe  . TOTAL ABDOMINAL HYSTERECTOMY W/ BILATERAL SALPINGOOPHORECTOMY      There were no vitals filed for this visit.  Subjective Assessment - 07/07/17 1525    Subjective  Patient reports she's feeling not good today and was not feeling good on Tuesday. Patient reports her legs have been hurting.     Pertinent History  PMH: A- fib, History of LBP(several years does not give exact date), gallbladder removal, history of neck pain, L2-5 Decompression    Limitations  Lifting;Standing;Walking    Currently in Pain?  No/denies    Pain Onset  More than a month ago       TREATMENT Therapeutic Exercise Nustep level 5, 8 min with focus on speed with performance; level 5 with focus on improving speed Squats with unilateral support in standing - 2 x 15 with UE support Standing hip abduction - 2 x 10 with cueing on maintaining an erect posture Standing marches against BTB in standing at treadmill - x 20 B Step ups in standing - 2 x 10 B without UE Farmers carry with 10# weight unilaterally - 1107ft B Backwards stepping with focus on taking large steps - 6 x 51ft down and  back  Tandem ambulation in standing -  8 x 42ft   Patient demonstrates increased fatigue at end of the session   PT Education - 07/07/17 1531    Education provided  Yes    Education Details  form/technique with exercise    Person(s) Educated  Patient    Methods  Explanation;Demonstration    Comprehension  Verbalized understanding;Returned demonstration          PT Long Term Goals - 06/20/17 1600      PT LONG TERM GOAL #1   Title  Patient will be independent with HEP to continue benefits of therapy after discharge.     Baseline  05/03/17: Requires frequent cueing on exercise performance and does not perform HEP regularly ; 06/20/17: Requires frequent cueing on exercise performance    Time  6    Period  Weeks    Status  On-going      PT LONG TERM GOAL #2   Title  Patient will be  able to perform all household chores without increase in pain to improve functional capacity.    Baseline  Requires a sitting rest break after 64mins of activity; 05/03/17: Patient continues to require a sitting rest break after 45 min; 06/20/17: can vacuum for 1 hour before requiring a sitting rest break    Time  4    Period  Weeks    Status  On-going      PT LONG TERM GOAL #3   Title  Patient will improve MODI to under 20% to improve self perceived lumbar function and greater ability to perform social activities without increase in pain.     Baseline  58% MODI; 05/03/17: 38% MODI; 06/20/17 not performed on this date to time restriction    Time  6    Period  Weeks    Status  On-going            Plan - 07/07/17 1543    Clinical Impression Statement  Continued to focus on performing hip stabilizaiton exercises and lumbar strengthening exercises in standing. Patient demonstrates improvement with performing step ups with greater ability to perform stand ups without requirement of UE support. Patient demonstrates improvement with ability to perform greater amount of exercise. Patient will benefit from further skilled therapy to return to prior level of function.     Rehab Potential  Good    Clinical Impairments Affecting Rehab Potential  (+) highly motivated; (-)     PT Frequency  2x / week    PT Duration  6 weeks    PT Treatment/Interventions  Passive range of motion;Manual techniques;Cryotherapy;Electrical Stimulation;Moist Heat;Ultrasound;Iontophoresis 4mg /ml Dexamethasone;Gait training;Therapeutic activities;Therapeutic exercise;Patient/family education;Neuromuscular re-education;Balance training;Dry needling    PT Next Visit Plan  Improve strength, endurance, and motor control    PT Home Exercise Plan  See education    Consulted and Agree with Plan of Care  Patient       Patient will benefit from skilled therapeutic intervention in order to improve the following deficits and impairments:   Decreased activity tolerance, Decreased endurance, Decreased range of motion, Decreased strength, Difficulty walking, Decreased mobility, Increased muscle spasms, Postural dysfunction, Decreased coordination, Pain, Impaired sensation, Increased fascial restricitons  Visit Diagnosis: Chronic midline low back pain without sciatica  Muscle weakness (generalized)     Problem List Patient Active Problem List   Diagnosis Date Noted  . Cognitive decline 06/14/2017  . Essential hypertension 06/14/2017  . B12 deficiency 09/18/2016  . Right hip pain 08/05/2015  . Anemia  08/05/2015  . Vitamin D deficiency 08/05/2015  . Hemoptysis 08/04/2015  . Constipation 02/04/2015  . Left knee pain 01/05/2015  . Status post laminectomy with spinal fusion 02/23/2014  . Fatigue 07/07/2013  . Dyspnea on exertion 08/12/2012  . Chronic lower back pain 08/12/2012  . Urge urinary incontinence 08/12/2012  . Personal history of colonic polyps 08/12/2012  . Seasonal rhinitis 01/16/2012  . Hiatal hernia with gastroesophageal reflux   . Fatigue 09/16/2011  . Controlled atrial fibrillation ([place]) 12/15/2010  . Irritable bowel syndrome without diarrhea 12/15/2010  . Hypothyroid 12/15/2010  . Long term current use of anticoagulant therapy 12/15/2010    Blythe Stanford, PT DPT 07/07/2017, 4:13 PM  [facility] [address], [place], [address] Phone: [phone]   Fax:  [phone]  Name: Alexandra Martinez MRN: [id number] Date of Birth: 1929-10-09

## 2017-07-11 ENCOUNTER — Ambulatory Visit: Payer: Medicare Other

## 2017-07-11 DIAGNOSIS — M6281 Muscle weakness (generalized): Secondary | ICD-10-CM | POA: Diagnosis not present

## 2017-07-11 DIAGNOSIS — M545 Low back pain: Secondary | ICD-10-CM | POA: Diagnosis not present

## 2017-07-11 DIAGNOSIS — G8929 Other chronic pain: Secondary | ICD-10-CM

## 2017-07-11 NOTE — Therapy (Signed)
[facility] [address], [place], [address] Phone: [phone]   Fax:  [phone]  Physical Therapy Treatment  Patient Details  Name: Alexandra Martinez MRN: [id number] Date of Birth: October 09, 1929 Referring Provider: Jovita Gamma MD   Encounter Date: 07/11/2017  PT End of Session - 07/11/17 1111    Visit Number  23    Number of Visits  32    Date for PT Re-Evaluation  07/18/17    PT Start Time  1100    PT Stop Time  1130    PT Time Calculation (min)  30 min    Activity Tolerance  Patient tolerated treatment well    Behavior During Therapy  [facility] for tasks assessed/performed       Past Medical History:  Diagnosis Date  . Anemia    r/t uterine fibroids  . Atrial fib/flutter, transient    [facility] admission for RVR 03/2009  . DDD (degenerative disc disease)   . Dysrhythmia    A-FIB  . GERD (gastroesophageal reflux disease)   . H/O bladder infections   . H/O hiatal hernia   . H/O thyroid nodule    surgically removed  . Heart murmur    moderate MR/TR 08/2013 echo [name])  . Hemorrhoids   . Hiatal hernia with gastroesophageal reflux 2008   by EGD,  [name]  . High cholesterol   . History of uterine fibroid   . Hydronephrosis    uretero- with congenital UPJ obstruction- Dr. Edrick Oh  . Hydronephrosis with ureteropelvic junction obstruction   . Hypertension    [facility]  . Hypothyroidism   . Nocturia   . Osteoporosis   . Polyuria   . Recurrent UTI     Past Surgical History:  Procedure Laterality Date  . ADENOIDECTOMY N/A 01/05/2016   Procedure: ADENOIDECTOMY;  Surgeon: Margaretha Sheffield, MD;  Location: [facility] ORS;  Service: ENT;  Laterality: N/A;  . BACK SURGERY    . CATARACT EXTRACTION     left  . CERVICAL DISC SURGERY  1980's  . CHOLECYSTECTOMY    . NASOPHARYNGOSCOPY  01/05/2016   Procedure: NASOPHARYNGOSCOPY;  Surgeon: Margaretha Sheffield, MD;  Location: [facility] ORS;  Service: ENT;;  .  THYROID LOBECTOMY     partial, right lobe  . TOTAL ABDOMINAL HYSTERECTOMY W/ BILATERAL SALPINGOOPHORECTOMY      There were no vitals filed for this visit.  Subjective Assessment - 07/11/17 1106    Subjective  Patient reports she has been having increased back pain all morning. Patient reports she is late secondary to losing the key to her car.    Pertinent History  PMH: A- fib, History of LBP(several years does not give exact date), gallbladder removal, history of neck pain, L2-5 Decompression    Limitations  Lifting;Standing;Walking    Currently in Pain?  No/denies    Pain Onset  More than a month ago       TREATMENT Therapeutic Exercise Sit to stands with raising the ball overhead with 4# -- x 15  Nustep level 5, 8 min with focus on speed with performance; with focus on improving speed Forward ambulation with GTB around knees - 2 x 64ft, backwards 2 x 58ft, side stepping - 2 x 25ft  Singe leg stance balance 2 x 20sec B Heel raises in standing - x 20    Patient demonstrates increased fatigue at end of the session    PT Education - 07/11/17 1110  Education provided  Yes    Education Details  form/technique with exericse    Person(s) Educated  Patient    Methods  Explanation;Demonstration    Comprehension  Verbalized understanding;Returned demonstration          PT Long Term Goals - 06/20/17 1600      PT LONG TERM GOAL #1   Title  Patient will be independent with HEP to continue benefits of therapy after discharge.     Baseline  05/03/17: Requires frequent cueing on exercise performance and does not perform HEP regularly ; 06/20/17: Requires frequent cueing on exercise performance    Time  6    Period  Weeks    Status  On-going      PT LONG TERM GOAL #2   Title  Patient will be able to perform all household chores without increase in pain to improve functional capacity.    Baseline  Requires a sitting rest break after 67mins of activity; 05/03/17: Patient continues to  require a sitting rest break after 45 min; 06/20/17: can vacuum for 1 hour before requiring a sitting rest break    Time  4    Period  Weeks    Status  On-going      PT LONG TERM GOAL #3   Title  Patient will improve MODI to under 20% to improve self perceived lumbar function and greater ability to perform social activities without increase in pain.     Baseline  58% MODI; 05/03/17: 38% MODI; 06/20/17 not performed on this date to time restriction    Time  6    Period  Weeks    Status  On-going            Plan - 07/11/17 1115    Clinical Impression Statement  Continued to focus on improving patient's bracing in standing with exercise performance. Patient requires maximal cueing to activating ant and posterior core musculature with exercises. Patient demosntrates poor carryover between sessions most likely due to memory difficulties. Patient also requires constant reminders throughout the session for exercises currently being performed.     Rehab Potential  Good    Clinical Impairments Affecting Rehab Potential  (+) highly motivated; (-)     PT Frequency  2x / week    PT Duration  6 weeks    PT Treatment/Interventions  Passive range of motion;Manual techniques;Cryotherapy;Electrical Stimulation;Moist Heat;Ultrasound;Iontophoresis 4mg /ml Dexamethasone;Gait training;Therapeutic activities;Therapeutic exercise;Patient/family education;Neuromuscular re-education;Balance training;Dry needling    PT Next Visit Plan  Improve strength, endurance, and motor control    PT Home Exercise Plan  See education    Consulted and Agree with Plan of Care  Patient       Patient will benefit from skilled therapeutic intervention in order to improve the following deficits and impairments:  Decreased activity tolerance, Decreased endurance, Decreased range of motion, Decreased strength, Difficulty walking, Decreased mobility, Increased muscle spasms, Postural dysfunction, Decreased coordination, Pain, Impaired  sensation, Increased fascial restricitons  Visit Diagnosis: Chronic midline low back pain without sciatica  Muscle weakness (generalized)     Problem List Patient Active Problem List   Diagnosis Date Noted  . Cognitive decline 06/14/2017  . Essential hypertension 06/14/2017  . B12 deficiency 09/18/2016  . Right hip pain 08/05/2015  . Anemia 08/05/2015  . Vitamin D deficiency 08/05/2015  . Hemoptysis 08/04/2015  . Constipation 02/04/2015  . Left knee pain 01/05/2015  . Status post laminectomy with spinal fusion 02/23/2014  . Fatigue 07/07/2013  . Dyspnea on exertion 08/12/2012  .  Chronic lower back pain 08/12/2012  . Urge urinary incontinence 08/12/2012  . Personal history of colonic polyps 08/12/2012  . Seasonal rhinitis 01/16/2012  . Hiatal hernia with gastroesophageal reflux   . Fatigue 09/16/2011  . Controlled atrial fibrillation ([place]) 12/15/2010  . Irritable bowel syndrome without diarrhea 12/15/2010  . Hypothyroid 12/15/2010  . Long term current use of anticoagulant therapy 12/15/2010    Blythe Stanford, PT DPT 07/11/2017, 1:30 PM  [facility] [address], [place], [address] Phone: [phone]   Fax:  [phone]  Name: WANEDA KLAMMER MRN: [id number] Date of Birth: October 21, 1929

## 2017-07-13 ENCOUNTER — Ambulatory Visit (INDEPENDENT_AMBULATORY_CARE_PROVIDER_SITE_OTHER): Payer: Medicare Other | Admitting: *Deleted

## 2017-07-13 DIAGNOSIS — E538 Deficiency of other specified B group vitamins: Secondary | ICD-10-CM | POA: Diagnosis not present

## 2017-07-13 MED ORDER — CYANOCOBALAMIN 1000 MCG/ML IJ SOLN
1000.0000 ug | Freq: Once | INTRAMUSCULAR | Status: AC
  Administered 2017-07-13: 1000 ug via INTRAMUSCULAR

## 2017-07-13 NOTE — Progress Notes (Signed)
Patient presented for B 12 injection to left deltoid, patient voiced no concerns nor showed any signs of distress during injection. 

## 2017-07-14 ENCOUNTER — Ambulatory Visit: Payer: Medicare Other

## 2017-07-14 DIAGNOSIS — M545 Low back pain: Secondary | ICD-10-CM | POA: Diagnosis not present

## 2017-07-14 DIAGNOSIS — G8929 Other chronic pain: Secondary | ICD-10-CM

## 2017-07-14 DIAGNOSIS — M6281 Muscle weakness (generalized): Secondary | ICD-10-CM | POA: Diagnosis not present

## 2017-07-14 NOTE — Therapy (Signed)
[place] [facility] [address] [place], [address] Phone: [phone]   Fax:  [phone]  Physical Therapy Treatment  Patient Details  Name: Alexandra Martinez MRN: [id number] Date of Birth: 02-Jun-1929 Referring Provider: Jovita Gamma MD   Encounter Date: 07/14/2017  PT End of Session - 07/14/17 1427    Visit Number  24    Number of Visits  32    Date for PT Re-Evaluation  07/18/17    PT Start Time  2952    PT Stop Time  1430    PT Time Calculation (min)  45 min    Activity Tolerance  Patient tolerated treatment well    Behavior During Therapy  [facility] for tasks assessed/performed       Past Medical History:  Diagnosis Date  . Anemia    r/t uterine fibroids  . Atrial fib/flutter, transient    [facility] admission for RVR 03/2009  . DDD (degenerative disc disease)   . Dysrhythmia    A-FIB  . GERD (gastroesophageal reflux disease)   . H/O bladder infections   . H/O hiatal hernia   . H/O thyroid nodule    surgically removed  . Heart murmur    moderate MR/TR 08/2013 echo [name])  . Hemorrhoids   . Hiatal hernia with gastroesophageal reflux 2008   by EGD,  [name]  . High cholesterol   . History of uterine fibroid   . Hydronephrosis    uretero- with congenital UPJ obstruction- Dr. Edrick Oh  . Hydronephrosis with ureteropelvic junction obstruction   . Hypertension    [facility]  . Hypothyroidism   . Nocturia   . Osteoporosis   . Polyuria   . Recurrent UTI     Past Surgical History:  Procedure Laterality Date  . ADENOIDECTOMY N/A 01/05/2016   Procedure: ADENOIDECTOMY;  Surgeon: Margaretha Sheffield, MD;  Location: [facility] ORS;  Service: ENT;  Laterality: N/A;  . BACK SURGERY    . CATARACT EXTRACTION     left  . CERVICAL DISC SURGERY  1980's  . CHOLECYSTECTOMY    . NASOPHARYNGOSCOPY  01/05/2016   Procedure: NASOPHARYNGOSCOPY;  Surgeon: Margaretha Sheffield, MD;  Location: [facility] ORS;  Service: ENT;;  .  THYROID LOBECTOMY     partial, right lobe  . TOTAL ABDOMINAL HYSTERECTOMY W/ BILATERAL SALPINGOOPHORECTOMY      There were no vitals filed for this visit.  Subjective Assessment - 07/14/17 1411    Subjective  Patient reports she continues to ahave pain and states she does some exercises but is unable to reproduce technique of the exercises she is performing.     Pertinent History  PMH: A- fib, History of LBP(several years does not give exact date), gallbladder removal, history of neck pain, L2-5 Decompression    Limitations  Lifting;Standing;Walking    Currently in Pain?  No/denies    Pain Onset  More than a month ago        TREATMENT Therapeutic Exercise Sit to stands with raising the ball overhead with 5# -- x 15 , x10  Nustep level 5, 8 min with focus on speed with performance; with focus on improving speed Standing balance on blue side of the bosu ball - 2 x 30sec Side step ups onto bosu ball - x10 up and over Walking forward/backward sideways in standing - x 7 (45ft each way)  Step ups onto 6" step x15 without UE support    Patient demonstrates increased fatigue at end  of the session   PT Education - 07/14/17 1420    Education provided  Yes    Education Details  form/technique with exercise    Person(s) Educated  Patient    Methods  Explanation;Demonstration    Comprehension  Verbalized understanding;Returned demonstration          PT Long Term Goals - 06/20/17 1600      PT LONG TERM GOAL #1   Title  Patient will be independent with HEP to continue benefits of therapy after discharge.     Baseline  05/03/17: Requires frequent cueing on exercise performance and does not perform HEP regularly ; 06/20/17: Requires frequent cueing on exercise performance    Time  6    Period  Weeks    Status  On-going      PT LONG TERM GOAL #2   Title  Patient will be able to perform all household chores without increase in pain to improve functional capacity.    Baseline  Requires  a sitting rest break after 23mins of activity; 05/03/17: Patient continues to require a sitting rest break after 45 min; 06/20/17: can vacuum for 1 hour before requiring a sitting rest break    Time  4    Period  Weeks    Status  On-going      PT LONG TERM GOAL #3   Title  Patient will improve MODI to under 20% to improve self perceived lumbar function and greater ability to perform social activities without increase in pain.     Baseline  58% MODI; 05/03/17: 38% MODI; 06/20/17 not performed on this date to time restriction    Time  6    Period  Weeks    Status  On-going            Plan - 07/14/17 1435    Clinical Impression Statement  Continued to address hip and lumbar weakness with performing exercises in standing to improve ability to perform standing chores at home. Patient reports she feels better after performing exercises but has poor carryover between sessions secondary to inability to remember HEP and decreased activity at home. Patient will benefit from further skilled therapy to return to prior level of function.     Rehab Potential  Good    Clinical Impairments Affecting Rehab Potential  (+) highly motivated; (-)     PT Frequency  2x / week    PT Duration  6 weeks    PT Treatment/Interventions  Passive range of motion;Manual techniques;Cryotherapy;Electrical Stimulation;Moist Heat;Ultrasound;Iontophoresis 4mg /ml Dexamethasone;Gait training;Therapeutic activities;Therapeutic exercise;Patient/family education;Neuromuscular re-education;Balance training;Dry needling    PT Next Visit Plan  Improve strength, endurance, and motor control    PT Home Exercise Plan  See education    Consulted and Agree with Plan of Care  Patient       Patient will benefit from skilled therapeutic intervention in order to improve the following deficits and impairments:  Decreased activity tolerance, Decreased endurance, Decreased range of motion, Decreased strength, Difficulty walking, Decreased  mobility, Increased muscle spasms, Postural dysfunction, Decreased coordination, Pain, Impaired sensation, Increased fascial restricitons  Visit Diagnosis: Chronic midline low back pain without sciatica  Muscle weakness (generalized)     Problem List Patient Active Problem List   Diagnosis Date Noted  . Cognitive decline 06/14/2017  . Essential hypertension 06/14/2017  . B12 deficiency 09/18/2016  . Right hip pain 08/05/2015  . Anemia 08/05/2015  . Vitamin D deficiency 08/05/2015  . Hemoptysis 08/04/2015  . Constipation 02/04/2015  .  Left knee pain 01/05/2015  . Status post laminectomy with spinal fusion 02/23/2014  . Fatigue 07/07/2013  . Dyspnea on exertion 08/12/2012  . Chronic lower back pain 08/12/2012  . Urge urinary incontinence 08/12/2012  . Personal history of colonic polyps 08/12/2012  . Seasonal rhinitis 01/16/2012  . Hiatal hernia with gastroesophageal reflux   . Fatigue 09/16/2011  . Controlled atrial fibrillation ([place]) 12/15/2010  . Irritable bowel syndrome without diarrhea 12/15/2010  . Hypothyroid 12/15/2010  . Long term current use of anticoagulant therapy 12/15/2010    Blythe Stanford, PT DPT 07/14/2017, 3:50 PM  [place] [facility] [address], [place], [address] Phone: [phone]   Fax:  [phone]  Name: Alexandra Martinez MRN: [id number] Date of Birth: [birth year]

## 2017-07-20 ENCOUNTER — Encounter: Payer: Self-pay | Admitting: Internal Medicine

## 2017-07-20 ENCOUNTER — Ambulatory Visit (INDEPENDENT_AMBULATORY_CARE_PROVIDER_SITE_OTHER): Payer: Medicare Other | Admitting: Internal Medicine

## 2017-07-20 VITALS — BP 152/90 | HR 97 | Temp 98.2°F | Resp 15 | Ht 64.0 in | Wt 148.0 lb

## 2017-07-20 DIAGNOSIS — I10 Essential (primary) hypertension: Secondary | ICD-10-CM | POA: Diagnosis not present

## 2017-07-20 DIAGNOSIS — R5383 Other fatigue: Secondary | ICD-10-CM | POA: Diagnosis not present

## 2017-07-20 DIAGNOSIS — R4189 Other symptoms and signs involving cognitive functions and awareness: Secondary | ICD-10-CM | POA: Diagnosis not present

## 2017-07-20 DIAGNOSIS — E538 Deficiency of other specified B group vitamins: Secondary | ICD-10-CM | POA: Diagnosis not present

## 2017-07-20 MED ORDER — CYANOCOBALAMIN 1000 MCG/ML IJ SOLN
1000.0000 ug | Freq: Once | INTRAMUSCULAR | Status: AC
  Administered 2017-07-20: 1000 ug via INTRAMUSCULAR

## 2017-07-20 MED ORDER — LOSARTAN POTASSIUM 100 MG PO TABS: 100.0000 mg | ORAL_TABLET | Freq: Every day | ORAL | 1 refills | Status: DC

## 2017-07-20 NOTE — Patient Instructions (Addendum)
I want you to return next week for a fasting lab appointment to check cholesterol,  Thyroid ,   Etc   I have increased your losartan to 100 mg daily .    Take 2 of current tablets  (50 x 2 = 100 mg ( until you get   The new prescription for  100 mg tablet    Continue metoprolol 100 mg twice daily

## 2017-07-20 NOTE — Progress Notes (Signed)
Subjective:  [name], female    DOB: 1929/06/19  Age: 84 y.o. MRN: [id number]  CC: The primary encounter diagnosis was B12 deficiency. Diagnoses of Essential hypertension, Cognitive decline, and Fatigue, unspecified type were also pertinent to this visit.  HPI Alexandra Martinez presents for 6 week follow up on cognitive decline noticed by family. , b12 deficiency,  hypertension    Accompanied by a niece .  Well dressed and clean.  She reports that  She has frequent fatigue  Attributed to her persistent back pain that did not improve despite the lumbar laminectomy in 2015 by Dr Joya Salm.  She continues to live independently  And manage her own medications and drives locally.  She has lost 4 lbs since last visit in February.  Diet reviewed.  She skips meals once a day due to decreased appetite.  She is adamant about remaining in her own home until she can no longer care for herself.  She exhibits some confusion about her medications and recent medication changes.  She is not using a pill box    Lab Results  Component Value Date   TSH 4.54 (H) 06/13/2017      Outpatient Medications Prior to Visit  Medication Sig Dispense Refill  . acetaminophen (TYLENOL) 500 MG tablet Take 500 mg by mouth every 6 (six) hours as needed for headache.    . Esomeprazole Magnesium (NEXIUM PO) Take 20 mg by mouth as needed.     . furosemide (LASIX) 20 MG tablet Take by mouth.    . levothyroxine (SYNTHROID, LEVOTHROID) 75 MCG tablet Take 1 tablet (75 mcg total) by mouth daily. 90 tablet 0  . metoprolol tartrate (LOPRESSOR) 100 MG tablet TAKE 1 TABLET BY MOUTH TWICE DAILY 180 tablet 0  . ondansetron (ZOFRAN) 4 MG tablet TAKE 1 TABLET BY MOUTH EVERY 8 HOURS AS NEEDED FOR NAUSEA OR VOMITING 30 tablet 0  . polyethylene glycol powder (GLYCOLAX/MIRALAX) powder Take 17 g by mouth daily as needed for mild constipation. 3350 g 1  . pravastatin (PRAVACHOL) 20 MG tablet TAKE 1 TABLET(20 MG) BY MOUTH EVERY  NIGHT 30 tablet 2  . Rivaroxaban (XARELTO) 15 MG TABS tablet Take 15 mg by mouth daily with supper.    . Simethicone (EQ GAS RELIEF) 125 MG CAPS Take 125 mg by mouth daily as needed (gas). Reported on 07/01/2015    . traMADol (ULTRAM) 50 MG tablet Take 1 tablet (50 mg total) by mouth every 8 (eight) hours as needed. 90 tablet 5  . losartan (COZAAR) 50 MG tablet Take 1 tablet (50 mg total) by mouth daily. (Patient taking differently: Take 50 mg by mouth daily. ) 90 tablet 0  . levothyroxine (SYNTHROID, LEVOTHROID) 50 MCG tablet TK 1 T PO QD  3  . pravastatin (PRAVACHOL) 20 MG tablet TAKE 1 TABLET(20 MG) BY MOUTH EVERY NIGHT (Patient not taking: Reported on 07/20/2017) 90 tablet 1   Facility-Administered Medications Prior to Visit  Medication Dose Route Frequency Provider Last Rate Last Dose  . cyanocobalamin ((VITAMIN B-12)) injection 1,000 mcg  1,000 mcg Intramuscular Once Crecencio Mc, MD        Review of Systems;  Patient denies headache, fevers, malaise, unintentional weight loss, skin rash, eye pain, sinus congestion and sinus pain, sore throat, dysphagia,  hemoptysis , cough, dyspnea, wheezing, chest pain, palpitations, orthopnea, edema, abdominal pain, nausea, melena, diarrhea, constipation, flank pain, dysuria, hematuria, urinary  Frequency, nocturia, numbness, tingling, seizures,  Focal weakness, Loss of  consciousness,  Tremor, insomnia, depression, anxiety, and suicidal ideation.      Objective:  BP (!) 152/90 (BP Location: Left Arm, Patient Position: Sitting, Cuff Size: Normal)   Pulse 97   Temp 98.2 F (36.8 C) (Oral)   Resp 15   Ht 5\' 4"  (1.626 m)   Wt 148 lb (67.1 kg)   SpO2 99%   BMI 25.40 kg/m   BP Readings from Last 3 Encounters:  07/20/17 (!) 152/90  06/13/17 (!) 158/76  06/03/17 (!) 147/70    Wt Readings from Last 3 Encounters:  07/20/17 148 lb (67.1 kg)  06/13/17 152 lb (68.9 kg)  06/03/17 159 lb (72.1 kg)    General appearance: alert, cooperative and  appears stated age Ears: normal TM's and external ear canals both ears Throat: lips, mucosa, and tongue normal; teeth and gums normal Neck: no adenopathy, no carotid bruit, supple, symmetrical, trachea midline and thyroid not enlarged, symmetric, no tenderness/mass/nodules Back: symmetric, no curvature. ROM normal. No CVA tenderness. Lungs: clear to auscultation bilaterally Heart: regular rate and rhythm, S1, S2 normal, no murmur, click, rub or gallop Abdomen: soft, non-tender; bowel sounds normal; no masses,  no organomegaly Pulses: 2+ and symmetric Skin: Skin color, texture, turgor normal. No rashes or lesions Lymph nodes: Cervical, supraclavicular, and axillary nodes normal.  No results found for: HGBA1C  Lab Results  Component Value Date   CREATININE 1.10 (H) 06/03/2017   CREATININE 1.06 (H) 09/05/2016   CREATININE 1.02 05/24/2016    Lab Results  Component Value Date   WBC 5.9 06/03/2017   HGB 11.4 (L) 06/03/2017   HCT 34.4 (L) 06/03/2017   PLT 162 06/03/2017   GLUCOSE 105 (H) 06/03/2017   CHOL 217 (H) 07/16/2013   TRIG 88.0 07/16/2013   HDL 41.70 07/16/2013   LDLCALC 158 (H) 07/16/2013   ALT 12 (L) 09/05/2016   AST 22 09/05/2016   NA 139 06/03/2017   K 4.2 06/03/2017   CL 107 06/03/2017   CREATININE 1.10 (H) 06/03/2017   BUN 15 06/03/2017   CO2 25 06/03/2017   TSH 4.54 (H) 06/13/2017   INR 1.6 (H) 08/04/2015    Mr Brain Wo Contrast  Result Date: 06/24/2017 CLINICAL DATA:  Cognitive changes.  Cognitive decline. EXAM: MRI HEAD WITHOUT CONTRAST TECHNIQUE: Multiplanar, multiecho pulse sequences of the brain and surrounding structures were obtained without intravenous contrast. COMPARISON:  None available FINDINGS: Brain: No hydrocephalus, collection, mass, or abnormal diffusion. Mild-to-moderate atrophy. Notable atrophy in the medial temporal lobes that is moderate to advanced, greater on the right. Alzheimer's disease could give this pattern. Mild for age chronic  microvascular ischemic type change in the periventricular white matter. No abnormal mineralization. No generalized chronic hemorrhagic foci. Vascular: Major flow voids are preserved. Developmental and incidental vertical course of the right proximal transverse sinus from the superior sagittal sinus. The left transverse sinus is in continuity with the straight sinus. Skull and upper cervical spine: No evidence of marrow lesion. Cervical facet spurring. Sinuses/Orbits: Bilateral cataract resection. IMPRESSION: 1. No reversible finding to explain memory loss. 2. Atrophy that is moderate to advanced at the medial temporal lobes. 3. Mild chronic small vessel ischemia. Electronically Signed   By: Monte Fantasia M.D.   On: 06/24/2017 13:27    Assessment & Plan:   Problem List Items Addressed This Visit    B12 deficiency - Primary   Relevant Medications   cyanocobalamin ((VITAMIN B-12)) injection 1,000 mcg (Completed)   Fatigue    Persistent complaint  With screening labs notable for B12 deficiency and hypothyroid,  Both were noted in February and have been  Addressed with B12 injectiosn and increase in levothyroxine dose to 75 mcg daily .  cardiology evaluation with Holter monitor showed controlled atrial fibrillation.  She has chronic low back pain which may be contributing.   Lab Results  Component Value Date   TSH 4.54 (H) 06/13/2017   Lab Results  Component Value Date   VITAMINB12 220 06/13/2017         Essential hypertension    Not at goal on losartan 50 mg.  Increasing dose to 100 mg today  Lab Results  Component Value Date   CREATININE 1.10 (H) 06/03/2017   Lab Results  Component Value Date   NA 139 06/03/2017   K 4.2 06/03/2017   CL 107 06/03/2017   CO2 25 06/03/2017         Relevant Medications   losartan (COZAAR) 100 MG tablet   Cognitive decline    MRI brain was done last month, and moderate to advanced atrophy in the medial temporal lobes  greater on the right was  noted which may indicate  Alzheimer's disease as the cause of her decline.  She also has low B12 and low thyroid,    Neurology referral is in process         I have changed Arlana Pouch. Degnan's losartan. I am also having her maintain her Simethicone, acetaminophen, Esomeprazole Magnesium (NEXIUM PO), polyethylene glycol powder, Rivaroxaban, furosemide, traMADol, pravastatin, metoprolol tartrate, levothyroxine, and ondansetron. We administered cyanocobalamin. We will continue to administer cyanocobalamin.  Meds ordered this encounter  Medications  . cyanocobalamin ((VITAMIN B-12)) injection 1,000 mcg  . losartan (COZAAR) 100 MG tablet    Sig: Take 1 tablet (100 mg total) by mouth daily.    Dispense:  90 tablet    Refill:  1    Medications Discontinued During This Encounter  Medication Reason  . levothyroxine (SYNTHROID, LEVOTHROID) 50 MCG tablet Error  . pravastatin (PRAVACHOL) 20 MG tablet Duplicate  . losartan (COZAAR) 50 MG tablet     Follow-up: Return in about 3 months (around 10/19/2017) for hypertension .   Crecencio Mc, MD

## 2017-07-21 ENCOUNTER — Other Ambulatory Visit: Payer: Self-pay | Admitting: Internal Medicine

## 2017-07-23 NOTE — Assessment & Plan Note (Signed)
Not at goal on losartan 50 mg.  Increasing dose to 100 mg today  Lab Results  Component Value Date   CREATININE 1.10 (H) 06/03/2017   Lab Results  Component Value Date   NA 139 06/03/2017   K 4.2 06/03/2017   CL 107 06/03/2017   CO2 25 06/03/2017

## 2017-07-23 NOTE — Assessment & Plan Note (Addendum)
MRI brain was done last month, and moderate to advanced atrophy in the medial temporal lobes  greater on the right was noted which may indicate  Alzheimer's disease as the cause of her decline.  She also has low B12 and low thyroid,    Neurology referral is in process

## 2017-07-23 NOTE — Assessment & Plan Note (Signed)
Persistent complaint  With screening labs notable for B12 deficiency and hypothyroid,  Both were noted in February and have been  Addressed with B12 injectiosn and increase in levothyroxine dose to 75 mcg daily .  cardiology evaluation with Holter monitor showed controlled atrial fibrillation.  She has chronic low back pain which may be contributing.   Lab Results  Component Value Date   TSH 4.54 (H) 06/13/2017   Lab Results  Component Value Date   VITAMINB12 220 06/13/2017

## 2017-07-24 NOTE — Progress Notes (Signed)
  I have reviewed the above information and agree with above.   Liahna Brickner, MD 

## 2017-07-26 ENCOUNTER — Telehealth: Payer: Self-pay | Admitting: Radiology

## 2017-07-26 ENCOUNTER — Ambulatory Visit: Payer: Medicare Other | Attending: Neurosurgery

## 2017-07-26 DIAGNOSIS — M6281 Muscle weakness (generalized): Secondary | ICD-10-CM | POA: Diagnosis not present

## 2017-07-26 DIAGNOSIS — I10 Essential (primary) hypertension: Secondary | ICD-10-CM

## 2017-07-26 DIAGNOSIS — M545 Low back pain: Secondary | ICD-10-CM | POA: Insufficient documentation

## 2017-07-26 DIAGNOSIS — E039 Hypothyroidism, unspecified: Secondary | ICD-10-CM

## 2017-07-26 DIAGNOSIS — G8929 Other chronic pain: Secondary | ICD-10-CM | POA: Diagnosis not present

## 2017-07-26 DIAGNOSIS — E559 Vitamin D deficiency, unspecified: Secondary | ICD-10-CM

## 2017-07-26 NOTE — Therapy (Signed)
[place] [facility] [address] [place], [address] Phone: [phone]   Fax:  [phone]  Physical Therapy Treatment  Patient Details  Name: Alexandra Martinez MRN: [id number] Date of Birth: 05/16/1929 Referring Provider: Jovita Gamma MD   Encounter Date: 07/26/2017  PT End of Session - 07/26/17 1527    Visit Number  25    Number of Visits  32    Date for PT Re-Evaluation  09/06/17    PT Start Time  9798    PT Stop Time  1600    PT Time Calculation (min)  45 min    Activity Tolerance  Patient tolerated treatment well    Behavior During Therapy  [facility] for tasks assessed/performed       Past Medical History:  Diagnosis Date  . Anemia    r/t uterine fibroids  . Atrial fib/flutter, transient    [facility] admission for RVR 03/2009  . DDD (degenerative disc disease)   . Dysrhythmia    A-FIB  . GERD (gastroesophageal reflux disease)   . H/O bladder infections   . H/O hiatal hernia   . H/O thyroid nodule    surgically removed  . Heart murmur    moderate MR/TR 08/2013 echo [name])  . Hemorrhoids   . Hiatal hernia with gastroesophageal reflux 2008   by EGD,  [name]  . High cholesterol   . History of uterine fibroid   . Hydronephrosis    uretero- with congenital UPJ obstruction- Dr. Edrick Oh  . Hydronephrosis with ureteropelvic junction obstruction   . Hypertension    [facility]  . Hypothyroidism   . Nocturia   . Osteoporosis   . Polyuria   . Recurrent UTI     Past Surgical History:  Procedure Laterality Date  . ADENOIDECTOMY N/A 01/05/2016   Procedure: ADENOIDECTOMY;  Surgeon: Margaretha Sheffield, MD;  Location: [facility] ORS;  Service: ENT;  Laterality: N/A;  . BACK SURGERY    . CATARACT EXTRACTION     left  . CERVICAL DISC SURGERY  1980's  . CHOLECYSTECTOMY    . NASOPHARYNGOSCOPY  01/05/2016   Procedure: NASOPHARYNGOSCOPY;  Surgeon: Margaretha Sheffield, MD;  Location: [facility] ORS;  Service: ENT;;  . THYROID  LOBECTOMY     partial, right lobe  . TOTAL ABDOMINAL HYSTERECTOMY W/ BILATERAL SALPINGOOPHORECTOMY      There were no vitals filed for this visit.  Subjective Assessment - 07/26/17 1523    Subjective  Patient reports she continues to have pain with lifting objects from the floor and states she has pain with vacuuming.     Pertinent History  PMH: A- fib, History of LBP(several years does not give exact date), gallbladder removal, history of neck pain, L2-5 Decompression    Limitations  Lifting;Standing;Walking    Currently in Pain?  No/denies    Pain Onset  More than a month ago       TREATMENT Hip extension in standing - x 20 without resistance; x20 against RTB  Standing squats with UE support - x 20 Squats lifts lifting 6" step in gym - 3 x 4 Single leg stance with intermittent UE and with PT support - 2 x 45sec  Standing hip abduction at hip abduction machine - x20 with 40# Nustep performed for 5 min at level 5 to decrease increased pain and spasms Patient demonstrates increased fatigue at the end of the session    PT Education - 07/26/17 1527    Education  provided  Yes    Education Details  form/technique with exercise    Person(s) Educated  Patient    Methods  Explanation;Demonstration    Comprehension  Verbalized understanding;Returned demonstration          PT Long Term Goals - 07/26/17 1547      PT LONG TERM GOAL #1   Title  Patient will be independent with HEP to continue benefits of therapy after discharge.     Baseline  05/03/17: Requires frequent cueing on exercise performance and does not perform HEP regularly ; 06/20/17: Requires frequent cueing on exercise performance; 07/26/2017: requires frequent cueing     Time  6    Period  Weeks    Status  On-going      PT LONG TERM GOAL #2   Title  Patient will be able to perform all household chores without increase in pain to improve functional capacity.    Baseline  Requires a sitting rest break after 48mins of  activity; 05/03/17: Patient continues to require a sitting rest break after 45 min; 06/20/17: can vacuum for 1 hour before requiring a sitting rest break; 07/26/2017: able to vacuum one room at a time; unable to give a value    Time  4    Period  Weeks    Status  On-going      PT LONG TERM GOAL #3   Title  Patient will improve MODI to under 20% to improve self perceived lumbar function and greater ability to perform social activities without increase in pain.     Baseline  58% MODI; 05/03/17: 38% MODI; 06/20/17 not performed on this date to time restriction; 07/26/2017: 26%    Time  6    Period  Weeks    Status  On-going            Plan - 07/26/17 1543    Clinical Impression Statement  Patient making limited progress towards long term goals. Patient experiences significant forgetfulness throughout treatment sessions requiring more cueing for proper technique with performing exercises. Patient demonstrates improvement in MODI but demonstrates difficulty with answering questions. Patient continues to have increased pain and spasms with exercises indicating increased weakness. Patient demonstrates poor carryover secondary to forgetfulness sx and will benefit from further skiled therapy focused on improving core/hip/LE musculature to return to prior level of function.     Rehab Potential  Good    Clinical Impairments Affecting Rehab Potential  (+) highly motivated; (-)     PT Frequency  2x / week    PT Duration  6 weeks    PT Treatment/Interventions  Passive range of motion;Manual techniques;Cryotherapy;Electrical Stimulation;Moist Heat;Ultrasound;Iontophoresis 4mg /ml Dexamethasone;Gait training;Therapeutic activities;Therapeutic exercise;Patient/family education;Neuromuscular re-education;Balance training;Dry needling    PT Next Visit Plan  Improve strength, endurance, and motor control    PT Home Exercise Plan  See education    Consulted and Agree with Plan of Care  Patient       Patient will  benefit from skilled therapeutic intervention in order to improve the following deficits and impairments:  Decreased activity tolerance, Decreased endurance, Decreased range of motion, Decreased strength, Difficulty walking, Decreased mobility, Increased muscle spasms, Postural dysfunction, Decreased coordination, Pain, Impaired sensation, Increased fascial restricitons  Visit Diagnosis: Chronic midline low back pain without sciatica  Muscle weakness (generalized)     Problem List Patient Active Problem List   Diagnosis Date Noted  . Cognitive decline 06/14/2017  . Essential hypertension 06/14/2017  . B12 deficiency 09/18/2016  . Right hip  pain 08/05/2015  . Anemia 08/05/2015  . Vitamin D deficiency 08/05/2015  . Hemoptysis 08/04/2015  . Constipation 02/04/2015  . Left knee pain 01/05/2015  . Status post laminectomy with spinal fusion 02/23/2014  . Fatigue 07/07/2013  . Dyspnea on exertion 08/12/2012  . Chronic lower back pain 08/12/2012  . Urge urinary incontinence 08/12/2012  . Personal history of colonic polyps 08/12/2012  . Seasonal rhinitis 01/16/2012  . Hiatal hernia with gastroesophageal reflux   . Fatigue 09/16/2011  . Controlled atrial fibrillation ([place]) 12/15/2010  . Irritable bowel syndrome without diarrhea 12/15/2010  . Hypothyroid 12/15/2010  . Long term current use of anticoagulant therapy 12/15/2010    Blythe Stanford, PT DPT 07/26/2017, 4:55 PM  [place] [facility] [address], [place], [address] Phone: [phone]   Fax:  [phone]  Name: Alexandra Martinez MRN: [id number] Date of Birth: 11/05/1929

## 2017-07-26 NOTE — Addendum Note (Signed)
Addended by: Crecencio Mc on: 07/26/2017 08:38 AM   Modules accepted: Orders

## 2017-07-26 NOTE — Telephone Encounter (Signed)
Nonfasting labs ordered

## 2017-07-26 NOTE — Telephone Encounter (Signed)
Pt coming in tomorrow for labs, please place future orders. Thank you.  

## 2017-07-27 ENCOUNTER — Ambulatory Visit: Payer: Medicare Other | Admitting: *Deleted

## 2017-07-27 ENCOUNTER — Other Ambulatory Visit (INDEPENDENT_AMBULATORY_CARE_PROVIDER_SITE_OTHER): Payer: Medicare Other

## 2017-07-27 VITALS — BP 160/88 | HR 66 | Resp 18

## 2017-07-27 DIAGNOSIS — R4181 Age-related cognitive decline: Secondary | ICD-10-CM

## 2017-07-27 DIAGNOSIS — I10 Essential (primary) hypertension: Secondary | ICD-10-CM

## 2017-07-27 DIAGNOSIS — E559 Vitamin D deficiency, unspecified: Secondary | ICD-10-CM | POA: Diagnosis not present

## 2017-07-27 DIAGNOSIS — E039 Hypothyroidism, unspecified: Secondary | ICD-10-CM | POA: Diagnosis not present

## 2017-07-27 LAB — BASIC METABOLIC PANEL
BUN: 14 mg/dL (ref 6–23)
CO2: 29 mEq/L (ref 19–32)
Calcium: 9 mg/dL (ref 8.4–10.5)
Chloride: 106 mEq/L (ref 96–112)
Creatinine, Ser: 1.25 mg/dL — ABNORMAL HIGH (ref 0.40–1.20)
GFR: 43.26 mL/min — ABNORMAL LOW (ref >60.00)
Glucose, Bld: 111 mg/dL — ABNORMAL HIGH (ref 70–99)
Potassium: 4.4 mEq/L (ref 3.5–5.1)
Sodium: 140 mEq/L (ref 135–145)

## 2017-07-27 LAB — T4, FREE: Free T4: 1.9 ng/dL — ABNORMAL HIGH (ref 0.60–1.60)

## 2017-07-27 LAB — TSH: TSH: 0.24 u[IU]/mL — ABNORMAL LOW (ref 0.35–4.50)

## 2017-07-27 LAB — VITAMIN D 25 HYDROXY (VIT D DEFICIENCY, FRACTURES): VITD: 28.09 ng/mL — ABNORMAL LOW (ref 30.00–100.00)

## 2017-07-27 NOTE — Progress Notes (Addendum)
Patient in for BP check after changing Losartan, left arm BP 160/84 pulse 78, waited additional 15 minutes BP right arm 160/88 pulse 66.   Patient's bp is higher than at visit.  I suspect that she is forgetting to take her meds due to cognitive decline.  She lives alone.  [facility] Care Management referral in progress to evaluate home situation and assist patient to improve compliance, eg, recommend use of a weekly pill box

## 2017-07-28 ENCOUNTER — Ambulatory Visit: Payer: Medicare Other

## 2017-07-28 DIAGNOSIS — M545 Low back pain, unspecified: Secondary | ICD-10-CM

## 2017-07-28 DIAGNOSIS — M6281 Muscle weakness (generalized): Secondary | ICD-10-CM | POA: Diagnosis not present

## 2017-07-28 DIAGNOSIS — G8929 Other chronic pain: Secondary | ICD-10-CM

## 2017-07-28 NOTE — Therapy (Signed)
[place] [facility] [address], [place], [address] Phone: [phone]   Fax:  [phone]  Physical Therapy Treatment  Patient Details  Name: Alexandra Martinez MRN: [id number] Date of Birth: 03-26-1930 Referring Provider: Jovita Gamma MD   Encounter Date: 07/28/2017  PT End of Session - 07/28/17 1557    Visit Number  26    Number of Visits  32    Date for PT Re-Evaluation  09/06/17    PT Start Time  1550    PT Stop Time  1430    PT Time Calculation (min)  1360 min    Activity Tolerance  Patient tolerated treatment well    Behavior During Therapy  [facility] for tasks assessed/performed       Past Medical History:  Diagnosis Date  . Anemia    r/t uterine fibroids  . Atrial fib/flutter, transient    [facility] admission for RVR 03/2009  . DDD (degenerative disc disease)   . Dysrhythmia    A-FIB  . GERD (gastroesophageal reflux disease)   . H/O bladder infections   . H/O hiatal hernia   . H/O thyroid nodule    surgically removed  . Heart murmur    moderate MR/TR 08/2013 echo [name])  . Hemorrhoids   . Hiatal hernia with gastroesophageal reflux 2008   by EGD,  [name]  . High cholesterol   . History of uterine fibroid   . Hydronephrosis    uretero- with congenital UPJ obstruction- Dr. Edrick Oh  . Hydronephrosis with ureteropelvic junction obstruction   . Hypertension    [facility]  . Hypothyroidism   . Nocturia   . Osteoporosis   . Polyuria   . Recurrent UTI     Past Surgical History:  Procedure Laterality Date  . ADENOIDECTOMY N/A 01/05/2016   Procedure: ADENOIDECTOMY;  Surgeon: Margaretha Sheffield, MD;  Location: [facility] ORS;  Service: ENT;  Laterality: N/A;  . BACK SURGERY    . CATARACT EXTRACTION     left  . CERVICAL DISC SURGERY  1980's  . CHOLECYSTECTOMY    . NASOPHARYNGOSCOPY  01/05/2016   Procedure: NASOPHARYNGOSCOPY;  Surgeon: Margaretha Sheffield, MD;  Location: [facility] ORS;  Service: ENT;;  .  THYROID LOBECTOMY     partial, right lobe  . TOTAL ABDOMINAL HYSTERECTOMY W/ BILATERAL SALPINGOOPHORECTOMY      There were no vitals filed for this visit.  Subjective Assessment - 07/28/17 1555    Subjective  Patient reports she continues to have minimal pain with performing activites around her home.     Pertinent History  PMH: A- fib, History of LBP(several years does not give exact date), gallbladder removal, history of neck pain, L2-5 Decompression    Limitations  Lifting;Standing;Walking    Currently in Pain?  No/denies    Pain Onset  More than a month ago       TREATMENT Nustep performed for 5 min at level 4 to decrease increased pain and spasms Hip abduction; hip machine - 3 x 10 25# Leg Press knee extension - 2 x 20 35# Side stepping with YTB around ankles - 6 x 91ft Sit to stands with 2# ball - x 15 - no second set performed secondary to breathlessness and fatigue Single leg stance - x30sec  Heel raises in standing - x25   Patient demonstrates increased fatigue at the end of the session  PT Education - 07/28/17 1557    Education provided  Yes  Education Details  form/technique with exercise    Person(s) Educated  Patient    Methods  Explanation;Demonstration    Comprehension  Verbalized understanding;Returned demonstration          PT Long Term Goals - 07/26/17 1547      PT LONG TERM GOAL #1   Title  Patient will be independent with HEP to continue benefits of therapy after discharge.     Baseline  05/03/17: Requires frequent cueing on exercise performance and does not perform HEP regularly ; 06/20/17: Requires frequent cueing on exercise performance; 07/26/2017: requires frequent cueing     Time  6    Period  Weeks    Status  On-going      PT LONG TERM GOAL #2   Title  Patient will be able to perform all household chores without increase in pain to improve functional capacity.    Baseline  Requires a sitting rest break after 44mins of activity; 05/03/17: Patient  continues to require a sitting rest break after 45 min; 06/20/17: can vacuum for 1 hour before requiring a sitting rest break; 07/26/2017: able to vacuum one room at a time; unable to give a value    Time  4    Period  Weeks    Status  On-going      PT LONG TERM GOAL #3   Title  Patient will improve MODI to under 20% to improve self perceived lumbar function and greater ability to perform social activities without increase in pain.     Baseline  58% MODI; 05/03/17: 38% MODI; 06/20/17 not performed on this date to time restriction; 07/26/2017: 26%    Time  6    Period  Weeks    Status  On-going            Plan - 07/28/17 1634    Clinical Impression Statement  Patient demonstrates increased fatigue and breathlessness with sit to stands and took patient's BP which measured 155/35mmHg. Performed decreased intensity with exercises today secondary to high diastolic BP. Patient continues to have increased pain with bending and lifting items most liekly due to poor biomechanics. Patient will benefit from further skilled therapy to return to prior level of function.     Rehab Potential  Good    Clinical Impairments Affecting Rehab Potential  (+) highly motivated; (-)     PT Frequency  2x / week    PT Duration  6 weeks    PT Treatment/Interventions  Passive range of motion;Manual techniques;Cryotherapy;Electrical Stimulation;Moist Heat;Ultrasound;Iontophoresis 4mg /ml Dexamethasone;Gait training;Therapeutic activities;Therapeutic exercise;Patient/family education;Neuromuscular re-education;Balance training;Dry needling    PT Next Visit Plan  Improve strength, endurance, and motor control    PT Home Exercise Plan  See education    Consulted and Agree with Plan of Care  Patient       Patient will benefit from skilled therapeutic intervention in order to improve the following deficits and impairments:  Decreased activity tolerance, Decreased endurance, Decreased range of motion, Decreased strength,  Difficulty walking, Decreased mobility, Increased muscle spasms, Postural dysfunction, Decreased coordination, Pain, Impaired sensation, Increased fascial restricitons  Visit Diagnosis: Chronic midline low back pain without sciatica  Muscle weakness (generalized)     Problem List Patient Active Problem List   Diagnosis Date Noted  . Cognitive decline 06/14/2017  . Essential hypertension 06/14/2017  . B12 deficiency 09/18/2016  . Right hip pain 08/05/2015  . Anemia 08/05/2015  . Vitamin D deficiency 08/05/2015  . Hemoptysis 08/04/2015  . Constipation 02/04/2015  .  Left knee pain 01/05/2015  . Status post laminectomy with spinal fusion 02/23/2014  . Fatigue 07/07/2013  . Dyspnea on exertion 08/12/2012  . Chronic lower back pain 08/12/2012  . Urge urinary incontinence 08/12/2012  . Personal history of colonic polyps 08/12/2012  . Seasonal rhinitis 01/16/2012  . Hiatal hernia with gastroesophageal reflux   . Fatigue 09/16/2011  . Controlled atrial fibrillation ([place]) 12/15/2010  . Irritable bowel syndrome without diarrhea 12/15/2010  . Hypothyroid 12/15/2010  . Long term current use of anticoagulant therapy 12/15/2010    Blythe Stanford, PT DPT 07/28/2017, 4:45 PM  [place] [facility] [address], [place], [address] Phone: [phone]   Fax:  [phone]  Name: AKEYA RYTHER MRN: [id number] Date of Birth: 1929-06-15

## 2017-07-29 ENCOUNTER — Other Ambulatory Visit: Payer: Self-pay | Admitting: Internal Medicine

## 2017-07-31 NOTE — Addendum Note (Signed)
Addended by: Crecencio Mc on: 07/31/2017 09:25 AM   Modules accepted: Orders

## 2017-08-01 ENCOUNTER — Ambulatory Visit: Payer: Medicare Other

## 2017-08-01 DIAGNOSIS — M545 Low back pain, unspecified: Secondary | ICD-10-CM

## 2017-08-01 DIAGNOSIS — M6281 Muscle weakness (generalized): Secondary | ICD-10-CM

## 2017-08-01 DIAGNOSIS — G8929 Other chronic pain: Secondary | ICD-10-CM | POA: Diagnosis not present

## 2017-08-01 NOTE — Progress Notes (Signed)
Patient niece notified per DPR , Niece stated patient will probably decline their help and not let them into the home, niece will notify office.

## 2017-08-01 NOTE — Therapy (Signed)
[place] [facility] [address], [place], [address] Phone: [phone]   Fax:  [phone]  Physical Therapy Treatment  Patient Details  Name: Alexandra Martinez MRN: [id number] Date of Birth: [birth year] Referring Provider: Jovita Gamma MD   Encounter Date: 08/01/2017  PT End of Session - 08/01/17 1542    Visit Number  27    Number of Visits  32    Date for PT Re-Evaluation  09/06/17    PT Start Time  6734    PT Stop Time  1600    PT Time Calculation (min)  45 min    Activity Tolerance  Patient tolerated treatment well    Behavior During Therapy  [facility] for tasks assessed/performed       Past Medical History:  Diagnosis Date  . Anemia    r/t uterine fibroids  . Atrial fib/flutter, transient    [facility] admission for RVR 03/2009  . DDD (degenerative disc disease)   . Dysrhythmia    A-FIB  . GERD (gastroesophageal reflux disease)   . H/O bladder infections   . H/O hiatal hernia   . H/O thyroid nodule    surgically removed  . Heart murmur    moderate MR/TR 08/2013 echo [name])  . Hemorrhoids   . Hiatal hernia with gastroesophageal reflux 2008   by EGD,  [name]  . High cholesterol   . History of uterine fibroid   . Hydronephrosis    uretero- with congenital UPJ obstruction- Dr. Edrick Oh  . Hydronephrosis with ureteropelvic junction obstruction   . Hypertension    [facility]  . Hypothyroidism   . Nocturia   . Osteoporosis   . Polyuria   . Recurrent UTI     Past Surgical History:  Procedure Laterality Date  . ADENOIDECTOMY N/A 01/05/2016   Procedure: ADENOIDECTOMY;  Surgeon: Margaretha Sheffield, MD;  Location: [facility] ORS;  Service: ENT;  Laterality: N/A;  . BACK SURGERY    . CATARACT EXTRACTION     left  . CERVICAL DISC SURGERY  1980's  . CHOLECYSTECTOMY    . NASOPHARYNGOSCOPY  01/05/2016   Procedure: NASOPHARYNGOSCOPY;  Surgeon: Margaretha Sheffield, MD;  Location: [facility] ORS;  Service: ENT;;  .  THYROID LOBECTOMY     partial, right lobe  . TOTAL ABDOMINAL HYSTERECTOMY W/ BILATERAL SALPINGOOPHORECTOMY      There were no vitals filed for this visit.  Subjective Assessment - 08/01/17 1517    Subjective  Patient reports increased pain with performing activites such as vacuuming and mopping around the house. Patient reports she did not have any time to perform her exercises     Pertinent History  PMH: A- fib, History of LBP(several years does not give exact date), gallbladder removal, history of neck pain, L2-5 Decompression    Limitations  Lifting;Standing;Walking    Currently in Pain?  No/denies    Pain Onset  More than a month ago       TREATMENT Nustep performed for 8 min at level 5 to decrease increased pain and spasms Sit to stands with TRX straps with UE support - 2x 20 Side stepping with YTB around knees  - 4 x 74ft Forward wedding marches YTB around knees holding 5# weight - 4 x 25 ft  Low row in standing - 2x 20 10# Rotational standing straight arm twists with BTB - x 20 B    Patient demonstrates increased fatigue at the end of the session  PT Education - 08/01/17 1536    Education provided  Yes    Education Details  form/technique with exercise    Person(s) Educated  Patient    Methods  Explanation;Demonstration    Comprehension  Verbalized understanding;Returned demonstration          PT Long Term Goals - 07/26/17 1547      PT LONG TERM GOAL #1   Title  Patient will be independent with HEP to continue benefits of therapy after discharge.     Baseline  05/03/17: Requires frequent cueing on exercise performance and does not perform HEP regularly ; 06/20/17: Requires frequent cueing on exercise performance; 07/26/2017: requires frequent cueing     Time  6    Period  Weeks    Status  On-going      PT LONG TERM GOAL #2   Title  Patient will be able to perform all household chores without increase in pain to improve functional capacity.    Baseline   Requires a sitting rest break after 33mins of activity; 05/03/17: Patient continues to require a sitting rest break after 45 min; 06/20/17: can vacuum for 1 hour before requiring a sitting rest break; 07/26/2017: able to vacuum one room at a time; unable to give a value    Time  4    Period  Weeks    Status  On-going      PT LONG TERM GOAL #3   Title  Patient will improve MODI to under 20% to improve self perceived lumbar function and greater ability to perform social activities without increase in pain.     Baseline  58% MODI; 05/03/17: 38% MODI; 06/20/17 not performed on this date to time restriction; 07/26/2017: 26%    Time  6    Period  Weeks    Status  On-going            Plan - 08/01/17 1551    Clinical Impression Statement  Continued to attempt to advance patient exercises performing greater amount of squatting motions compared to the previous treatment session. Patient's BP is 110/60 and continued to perform therapy. Introduced rotation with lumbar movement and functional mobility. Patient demonstrates increased difficulty with performance of the rotational movement indicating poor control and coordination. Patient will benefit from further skilled therapy to return to prior level of function.     Rehab Potential  Good    Clinical Impairments Affecting Rehab Potential  (+) highly motivated; (-)     PT Frequency  2x / week    PT Duration  6 weeks    PT Treatment/Interventions  Passive range of motion;Manual techniques;Cryotherapy;Electrical Stimulation;Moist Heat;Ultrasound;Iontophoresis 4mg /ml Dexamethasone;Gait training;Therapeutic activities;Therapeutic exercise;Patient/family education;Neuromuscular re-education;Balance training;Dry needling    PT Next Visit Plan  Improve strength, endurance, and motor control    PT Home Exercise Plan  See education    Consulted and Agree with Plan of Care  Patient       Patient will benefit from skilled therapeutic intervention in order to improve  the following deficits and impairments:  Decreased activity tolerance, Decreased endurance, Decreased range of motion, Decreased strength, Difficulty walking, Decreased mobility, Increased muscle spasms, Postural dysfunction, Decreased coordination, Pain, Impaired sensation, Increased fascial restricitons  Visit Diagnosis: Muscle weakness (generalized)  Chronic midline low back pain without sciatica     Problem List Patient Active Problem List   Diagnosis Date Noted  . Cognitive decline 06/14/2017  . Essential hypertension 06/14/2017  . B12 deficiency 09/18/2016  . Right hip pain  08/05/2015  . Anemia 08/05/2015  . Vitamin D deficiency 08/05/2015  . Hemoptysis 08/04/2015  . Constipation 02/04/2015  . Left knee pain 01/05/2015  . Status post laminectomy with spinal fusion 02/23/2014  . Fatigue 07/07/2013  . Dyspnea on exertion 08/12/2012  . Chronic lower back pain 08/12/2012  . Urge urinary incontinence 08/12/2012  . Personal history of colonic polyps 08/12/2012  . Seasonal rhinitis 01/16/2012  . Hiatal hernia with gastroesophageal reflux   . Fatigue 09/16/2011  . Controlled atrial fibrillation ([place]) 12/15/2010  . Irritable bowel syndrome without diarrhea 12/15/2010  . Hypothyroid 12/15/2010  . Long term current use of anticoagulant therapy 12/15/2010    Blythe Stanford, PT DPT 08/01/2017, 4:03 PM  [place] [facility] [address], [place], [address] Phone: [phone]   Fax:  [phone]  Name: Alexandra Martinez MRN: [id number] Date of Birth: 03-21-1930

## 2017-08-04 ENCOUNTER — Ambulatory Visit: Payer: Medicare Other

## 2017-08-09 ENCOUNTER — Other Ambulatory Visit: Payer: Self-pay | Admitting: *Deleted

## 2017-08-09 ENCOUNTER — Encounter: Payer: Self-pay | Admitting: *Deleted

## 2017-08-09 ENCOUNTER — Ambulatory Visit: Payer: Medicare Other

## 2017-08-09 DIAGNOSIS — G8929 Other chronic pain: Secondary | ICD-10-CM

## 2017-08-09 DIAGNOSIS — M6281 Muscle weakness (generalized): Secondary | ICD-10-CM

## 2017-08-09 DIAGNOSIS — M545 Low back pain: Secondary | ICD-10-CM | POA: Diagnosis not present

## 2017-08-09 NOTE — Patient Outreach (Signed)
[facility] [facility]) Care Management  08/09/2017  ZABRIA LISS [birth year] [phone]    Telephone Screen (referral from MD) Assisting Quinn Plowman, RN  Initial outreach attempt unsuccessful however able to leave a HIPAA approved voice message requesting a call back. Will inquire further on pt's possible needs at that time. Will send outreach letter and follow up once gain with another call this week for pending services.  Raina Mina, RN Care Management Coordinator [place] Office [phone]

## 2017-08-09 NOTE — Therapy (Signed)
[facility] [address] [place], [address] Phone: [phone]   Fax:  [phone]  Physical Therapy Treatment  Patient Details  Name: Alexandra Martinez MRN: [id number] Date of Birth: 11-12-1929 Referring Provider: Jovita Gamma MD   Encounter Date: 08/09/2017  PT End of Session - 08/09/17 1505    Visit Number  28    Number of Visits  32    Date for PT Re-Evaluation  09/06/17    Authorization Type  4 out of 10 progress note    PT Start Time  1430    PT Stop Time  1515    PT Time Calculation (min)  45 min    Activity Tolerance  Patient tolerated treatment well    Behavior During Therapy  [facility] for tasks assessed/performed       Past Medical History:  Diagnosis Date  . Anemia    r/t uterine fibroids  . Atrial fib/flutter, transient    [facility] admission for RVR 03/2009  . DDD (degenerative disc disease)   . Dysrhythmia    A-FIB  . GERD (gastroesophageal reflux disease)   . H/O bladder infections   . H/O hiatal hernia   . H/O thyroid nodule    surgically removed  . Heart murmur    moderate MR/TR 08/2013 echo [name])  . Hemorrhoids   . Hiatal hernia with gastroesophageal reflux 2008   by EGD,  [name]  . High cholesterol   . History of uterine fibroid   . Hydronephrosis    uretero- with congenital UPJ obstruction- Dr. Edrick Oh  . Hydronephrosis with ureteropelvic junction obstruction   . Hypertension    [facility]  . Hypothyroidism   . Nocturia   . Osteoporosis   . Polyuria   . Recurrent UTI     Past Surgical History:  Procedure Laterality Date  . ADENOIDECTOMY N/A 01/05/2016   Procedure: ADENOIDECTOMY;  Surgeon: Margaretha Sheffield, MD;  Location: [facility] ORS;  Service: ENT;  Laterality: N/A;  . BACK SURGERY    . CATARACT EXTRACTION     left  . CERVICAL DISC SURGERY  1980's  . CHOLECYSTECTOMY    . NASOPHARYNGOSCOPY  01/05/2016   Procedure: NASOPHARYNGOSCOPY;  Surgeon: Margaretha Sheffield,  MD;  Location: [facility] ORS;  Service: ENT;;  . THYROID LOBECTOMY     partial, right lobe  . TOTAL ABDOMINAL HYSTERECTOMY W/ BILATERAL SALPINGOOPHORECTOMY      There were no vitals filed for this visit.  Subjective Assessment - 08/09/17 1500    Subjective  Patient reports she doesn't know if she should be conitnuing therapy. Patient states although she has been improving initially she doesn't know if it has been helping her pain much recently.     Pertinent History  PMH: A- fib, History of LBP(several years does not give exact date), gallbladder removal, history of neck pain, L2-5 Decompression    Limitations  Lifting;Standing;Walking    Currently in Pain?  No/denies    Pain Onset  More than a month ago       TREATMENT: Therapeutic Exercise: Standing back extension -- x 20  Side stepping in standing -- x 10 3 steps in each direction Standing hip abduction with UE support -- x 20  Standing hip extension with UE support -- x 20  Bridges in hooklying -- x 20  Sit to stands -- x 10   Patient is able to repeat back regimen at end of session   PT Education -  08/09/17 1504    Education provided  Yes    Education Details  HEP: Back extension, hip abduction, hip extension, side stepping, bridges, sit to stands    Person(s) Educated  Patient    Methods  Explanation;Demonstration;Handout    Comprehension  Verbalized understanding;Returned demonstration          PT Long Term Goals - 07/26/17 1547      PT LONG TERM GOAL #1   Title  Patient will be independent with HEP to continue benefits of therapy after discharge.     Baseline  05/03/17: Requires frequent cueing on exercise performance and does not perform HEP regularly ; 06/20/17: Requires frequent cueing on exercise performance; 07/26/2017: requires frequent cueing     Time  6    Period  Weeks    Status  On-going      PT LONG TERM GOAL #2   Title  Patient will be able to perform all household chores without increase in pain to improve  functional capacity.    Baseline  Requires a sitting rest break after 11mins of activity; 05/03/17: Patient continues to require a sitting rest break after 45 min; 06/20/17: can vacuum for 1 hour before requiring a sitting rest break; 07/26/2017: able to vacuum one room at a time; unable to give a value    Time  4    Period  Weeks    Status  On-going      PT LONG TERM GOAL #3   Title  Patient will improve MODI to under 20% to improve self perceived lumbar function and greater ability to perform social activities without increase in pain.     Baseline  58% MODI; 05/03/17: 38% MODI; 06/20/17 not performed on this date to time restriction; 07/26/2017: 26%    Time  6    Period  Weeks    Status  On-going            Plan - 08/09/17 1506    Clinical Impression Statement  Educated patient heavily on performing HEP and printed out 3 copies of her exercises for home. Patient has difficulties with following the written instructions on the exercise sheet. We will try to take 2 weeks off of therapy to see if patient can perform her exercises without guidance at home. Patient will benefit from further skilled therapy focused on improving exercise performance to return to prior level of function.     Rehab Potential  Good    Clinical Impairments Affecting Rehab Potential  (+) highly motivated; (-)     PT Frequency  2x / week    PT Duration  6 weeks    PT Treatment/Interventions  Passive range of motion;Manual techniques;Cryotherapy;Electrical Stimulation;Moist Heat;Ultrasound;Iontophoresis 4mg /ml Dexamethasone;Gait training;Therapeutic activities;Therapeutic exercise;Patient/family education;Neuromuscular re-education;Balance training;Dry needling    PT Next Visit Plan  Improve strength, endurance, and motor control    PT Home Exercise Plan  See education    Consulted and Agree with Plan of Care  Patient       Patient will benefit from skilled therapeutic intervention in order to improve the following  deficits and impairments:  Decreased activity tolerance, Decreased endurance, Decreased range of motion, Decreased strength, Difficulty walking, Decreased mobility, Increased muscle spasms, Postural dysfunction, Decreased coordination, Pain, Impaired sensation, Increased fascial restricitons  Visit Diagnosis: Muscle weakness (generalized)  Chronic midline low back pain without sciatica     Problem List Patient Active Problem List   Diagnosis Date Noted  . Cognitive decline 06/14/2017  . Essential hypertension  06/14/2017  . B12 deficiency 09/18/2016  . Right hip pain 08/05/2015  . Anemia 08/05/2015  . Vitamin D deficiency 08/05/2015  . Hemoptysis 08/04/2015  . Constipation 02/04/2015  . Left knee pain 01/05/2015  . Status post laminectomy with spinal fusion 02/23/2014  . Fatigue 07/07/2013  . Dyspnea on exertion 08/12/2012  . Chronic lower back pain 08/12/2012  . Urge urinary incontinence 08/12/2012  . Personal history of colonic polyps 08/12/2012  . Seasonal rhinitis 01/16/2012  . Hiatal hernia with gastroesophageal reflux   . Fatigue 09/16/2011  . Controlled atrial fibrillation ([place]) 12/15/2010  . Irritable bowel syndrome without diarrhea 12/15/2010  . Hypothyroid 12/15/2010  . Long term current use of anticoagulant therapy 12/15/2010    Blythe Stanford, PT DPT 08/09/2017, 3:13 PM  [facility] [address], [place], [address] Phone: [phone]   Fax:  [phone]  Name: Alexandra Martinez MRN: [id number] Date of Birth: [birth year]

## 2017-08-11 ENCOUNTER — Other Ambulatory Visit: Payer: Self-pay | Admitting: *Deleted

## 2017-08-11 ENCOUNTER — Ambulatory Visit: Payer: Medicare Other

## 2017-08-11 NOTE — Patient Outreach (Signed)
[place] [facility]) Care Management  08/11/2017  Alexandra Martinez Apr 01, 1930 [id number]    Telephone Screen (MD referral) Assisting Quinn Plowman, RN  [name] called RN back and left a voice message with RN case manager. RN attempted once again to reach pt at the listed number however only able to leave a HIPAA voice message requesting a call back. Will attempt another call back next week prior to closing this case. Note letter has been sent to pt on the initial unsuccessful contact. Will screen accordingly for any needs.  Raina Mina, RN Care Management Coordinator [place] Office [phone]

## 2017-08-12 ENCOUNTER — Encounter: Payer: Self-pay | Admitting: *Deleted

## 2017-08-12 ENCOUNTER — Other Ambulatory Visit: Payer: Self-pay | Admitting: *Deleted

## 2017-08-12 ENCOUNTER — Ambulatory Visit: Payer: Self-pay | Admitting: *Deleted

## 2017-08-12 NOTE — Patient Outreach (Signed)
[place] [facility]) Care Management  08/12/2017  Alexandra Martinez [birth year] [id number]    Screening via MD referral Assisting Quinn Plowman, RN  RN spoke with [name] relative of the pt with returned calls based upon HIPAA mssasge left to her phone for this pt. RN inquired if I could communicate with the pt to obtain permission to speak with her concerning this pt. Pt's home number was provided and RN contacted the pt directly and introduced the [facility] program/services and the purpose for today's call. Inquired if RN could speak with her relative [name]. Pt indicated I could if needed but she would prefer I discussed today's subject on [facility] services with the requested screening with her directly. RN completed th screening with the pt's assistance and inquired on any needs at this time. Pt declined pharmacy needs and community resources indicating she has family who assist and still able to drive if needed. Pt states she visited her provider's office several weeks ago but did not have a examination at that time. Pt states she continues to do things for herself and doesn't need any help at this time but RN provided permission to contact her niece [name] to inquire further on any needs she may feel pt needs at this time. Pt aware [facility] was referred by her provider's office on today's contact for pending services.  RN contacted [name] (niece) as pt permitted and explained the conversation above with pt's decline for services. Niece indicates pt need assistance with her medications but currently pt has a pill box however pt has all her medications in a bag and occasionally misplace the bag. States pt starting to "forget more and more". Level of care discussed however pt declines and states to this niece that she will not be placed and "will die in her home". Caregiver states pt will decline all services but needs helps. RN offered pharmacy to assist along with a social worker if pt  is willing to accept [facility] services however caregiver states she will decline and she will have to do more conveniening with the pt prior to [facility] involvement. Caregiver very appreciative for the call and inquire today. RN provided caregiver with the direct number the [facility] if pt is willing to allow [facility] services to assist her further however will update pt's provider on pt's option to decline [facility] services at this time.    Raina Mina, RN Care Management Coordinator [place] Office [phone]

## 2017-08-16 ENCOUNTER — Other Ambulatory Visit: Payer: Self-pay

## 2017-08-16 MED ORDER — TRAMADOL HCL 50 MG PO TABS: 50.0000 mg | ORAL_TABLET | Freq: Three times a day (TID) | ORAL | 5 refills | Status: DC | PRN

## 2017-08-16 NOTE — Telephone Encounter (Signed)
Refilled: 08/23/2016 Last OV: 07/20/2017 Next OV: 10/25/2017

## 2017-08-17 ENCOUNTER — Ambulatory Visit: Payer: Self-pay | Admitting: *Deleted

## 2017-08-17 NOTE — Telephone Encounter (Signed)
Printed, signed and faxed.  

## 2017-09-06 ENCOUNTER — Ambulatory Visit: Payer: Medicare Other | Attending: Neurosurgery

## 2017-09-07 ENCOUNTER — Ambulatory Visit (INDEPENDENT_AMBULATORY_CARE_PROVIDER_SITE_OTHER): Payer: Medicare Other | Admitting: Neurology

## 2017-09-07 ENCOUNTER — Encounter: Payer: Self-pay | Admitting: Neurology

## 2017-09-07 ENCOUNTER — Other Ambulatory Visit: Payer: Self-pay

## 2017-09-07 VITALS — BP 120/62 | HR 76 | Ht 63.0 in | Wt 141.0 lb

## 2017-09-07 DIAGNOSIS — F0391 Unspecified dementia with behavioral disturbance: Secondary | ICD-10-CM

## 2017-09-07 DIAGNOSIS — F03B18 Unspecified dementia, moderate, with other behavioral disturbance: Secondary | ICD-10-CM

## 2017-09-07 MED ORDER — DONEPEZIL HCL 10 MG PO TABS: ORAL_TABLET | ORAL | 11 refills | Status: DC

## 2017-09-07 NOTE — Progress Notes (Signed)
NEUROLOGY CONSULTATION NOTE  Alexandra Martinez MRN: [id number] DOB: 05-23-1929  Referring provider: Dr. Deborra Medina Primary care provider: Dr. Deborra Medina  Reason for consult:  Memory loss  Dear Dr Derrel Nip:  Thank you for your kind referral of Alexandra Martinez for consultation of the above symptoms. Although her history is well known to you, please allow me to reiterate it for the purpose of our medical record. The patient was accompanied to the clinic by her niece [name] who also provides collateral information. Records and images were personally reviewed where available.  HISTORY OF PRESENT ILLNESS: This is an 84 year old right-handed woman with a history of hypertension, hyperlipidemia, atrial fibrillation on anticoagulation with Xarelto, presenting for evaluation of memory loss. She does not feel her memory is much off, and states "I don't read as much as I used to." She lives alone. Her niece [name] accompanies her today and has difficulty providing additional history because "she will get ill at me." [name] started noticing changes around a year ago. [name] sees the patient on average 3-4 times a week. [name] has tried to help her with the medications but she insists on doing it herself. [name] tried to get her a pillbox but is not sure if she uses them. [name] then says she takes the medications pretty well. She denies missing bill payments, but [name] reports she has forgotten a few bills 3-4 months ago and had to pay late fees for her water and cable bill. She denies getting lost driving, [name] is unsure, but does report that she visited family last Sunday who lives 3 miles from [place], but it took her 45 minutes to get to [employer] after. She told [name] the road was being torn up, but there is no construction going on. She gets very defensive and upset during the visit, and states she was doing fine until she had back surgery in 2015 and it still bothers her. Per PCP note, [name] gave a handwritten  note about concerns, forgetting appointments, word-finding difficulties, and visual hallucinations. She has told family that she has woke up several times and found strange people in her home. Family attempts to discuss her condition have been repeatedly rebuffed. Her PCP tried to get home health but she told [place] they were not needed when they called.   She has back pain and occasional neck pain, occasional constipation and urinary incontinence. No headaches, dizziness, diplopia, dysarthria/dysphagia, focal numbness/tingling/weakness, anosmia, or tremors. Her sister had Alzheimer's disease. No history of significant head injuries or alcohol use.  I personally reviewed MRI brain without contrast done 06/24/2017 which did not show any acute changes, there was moderate to advanced atrophy, more prominent at the medial temporal lobes.  Laboratory Data: Lab Results  Component Value Date   TSH 0.24 (L) 07/27/2017   Lab Results  Component Value Date   [id number] 06/13/2017     PAST MEDICAL HISTORY: Past Medical History:  Diagnosis Date  . Anemia    r/t uterine fibroids  . Atrial fib/flutter, transient    [facility] admission for RVR 03/2009  . DDD (degenerative disc disease)   . Dysrhythmia    A-FIB  . GERD (gastroesophageal reflux disease)   . H/O bladder infections   . H/O hiatal hernia   . H/O thyroid nodule    surgically removed  . Heart murmur    moderate MR/TR 08/2013 echo [name])  . Hemorrhoids   . Hiatal hernia with gastroesophageal reflux 2008  by EGD,  [name]  . High cholesterol   . History of uterine fibroid   . Hydronephrosis    uretero- with congenital UPJ obstruction- Dr. Edrick Oh  . Hydronephrosis with ureteropelvic junction obstruction   . Hypertension    [facility]  . Hypothyroidism   . Nocturia   . Osteoporosis   . Polyuria   . Recurrent UTI     PAST SURGICAL HISTORY: Past Surgical History:  Procedure Laterality Date  . ADENOIDECTOMY N/A  01/05/2016   Procedure: ADENOIDECTOMY;  Surgeon: Margaretha Sheffield, MD;  Location: [facility] ORS;  Service: ENT;  Laterality: N/A;  . BACK SURGERY    . CATARACT EXTRACTION     left  . CERVICAL DISC SURGERY  1980's  . CHOLECYSTECTOMY    . NASOPHARYNGOSCOPY  01/05/2016   Procedure: NASOPHARYNGOSCOPY;  Surgeon: Margaretha Sheffield, MD;  Location: [facility] ORS;  Service: ENT;;  . THYROID LOBECTOMY     partial, right lobe  . TOTAL ABDOMINAL HYSTERECTOMY W/ BILATERAL SALPINGOOPHORECTOMY      MEDICATIONS: Current Outpatient Medications on File Prior to Visit  Medication Sig Dispense Refill  . acetaminophen (TYLENOL) 500 MG tablet Take 500 mg by mouth every 6 (six) hours as needed for headache.    . Esomeprazole Magnesium (NEXIUM PO) Take 20 mg by mouth as needed.     . furosemide (LASIX) 20 MG tablet Take by mouth.    . levothyroxine (SYNTHROID, LEVOTHROID) 50 MCG tablet TAKE 1 TABLET BY MOUTH EVERY DAY 90 tablet 1  . losartan (COZAAR) 100 MG tablet Take 1 tablet (100 mg total) by mouth daily. 90 tablet 1  . metoprolol tartrate (LOPRESSOR) 100 MG tablet TAKE 1 TABLET BY MOUTH TWICE DAILY 180 tablet 0  . ondansetron (ZOFRAN) 4 MG tablet TAKE 1 TABLET BY MOUTH EVERY 8 HOURS AS NEEDED FOR NAUSEA OR VOMITING 30 tablet 0  . polyethylene glycol powder (GLYCOLAX/MIRALAX) powder Take 17 g by mouth daily as needed for mild constipation. 3350 g 1  . pravastatin (PRAVACHOL) 20 MG tablet TAKE 1 TABLET(20 MG) BY MOUTH EVERY NIGHT 30 tablet 2  . Rivaroxaban (XARELTO) 15 MG TABS tablet Take 15 mg by mouth daily with supper.    . Simethicone (EQ GAS RELIEF) 125 MG CAPS Take 125 mg by mouth daily as needed (gas). Reported on 07/01/2015    . traMADol (ULTRAM) 50 MG tablet Take 1 tablet (50 mg total) by mouth every 8 (eight) hours as needed. 90 tablet 5   Current Facility-Administered Medications on File Prior to Visit  Medication Dose Route Frequency Provider Last Rate Last Dose  . cyanocobalamin ((VITAMIN B-12)) injection 1,000 mcg   1,000 mcg Intramuscular Once Crecencio Mc, MD        ALLERGIES: Allergies  Allergen Reactions  . Celebrex [Celecoxib] Other (See Comments)  . Loratadine-Pseudoephedrine Er     Other reaction(s): Unknown  . Venlafaxine Other (See Comments)  . Vioxx [Rofecoxib] Other (See Comments)  . Codeine Itching and Rash  . Dexilant [Dexlansoprazole] Rash  . Eliquis [Apixaban] Rash    FAMILY HISTORY: Family History  Problem Relation Age of Onset  . Heart disease Mother   . Coronary artery disease Father        MI's    SOCIAL HISTORY: Social History   Socioeconomic History  . Marital status: Widowed    Spouse name: Not on file  . Number of children: Not on file  . Years of education: Not on file  . Highest education level: Not  on file  Occupational History  . Not on file  Social Needs  . Financial resource strain: Not on file  . Food insecurity:    Worry: Not on file    Inability: Not on file  . Transportation needs:    Medical: Not on file    Non-medical: Not on file  Tobacco Use  . Smoking status: Never Smoker  . Smokeless tobacco: Never Used  Substance and Sexual Activity  . Alcohol use: No  . Drug use: No  . Sexual activity: Never  Lifestyle  . Physical activity:    Days per week: Not on file    Minutes per session: Not on file  . Stress: Not on file  Relationships  . Social connections:    Talks on phone: Not on file    Gets together: Not on file    Attends religious service: Not on file    Active member of club or organization: Not on file    Attends meetings of clubs or organizations: Not on file    Relationship status: Not on file  . Intimate partner violence:    Fear of current or ex partner: Not on file    Emotionally abused: Not on file    Physically abused: Not on file    Forced sexual activity: Not on file  Other Topics Concern  . Not on file  Social History Narrative   Lives alone, widowed in 2005.   Husband was a smoker.    REVIEW OF  SYSTEMS: Constitutional: No fevers, chills, or sweats, no generalized fatigue, change in appetite Eyes: No visual changes, double vision, eye pain Ear, nose and throat: No hearing loss, ear pain, nasal congestion, sore throat Cardiovascular: No chest pain, palpitations Respiratory:  No shortness of breath at rest or with exertion, wheezes GastrointestinaI: No nausea, vomiting, diarrhea, abdominal pain, fecal incontinence Genitourinary:  No dysuria, urinary retention or frequency Musculoskeletal:  + neck pain, back pain Integumentary: No rash, pruritus, skin lesions Neurological: as above Psychiatric: No depression, insomnia, anxiety Endocrine: No palpitations, fatigue, diaphoresis, mood swings, change in appetite, change in weight, increased thirst Hematologic/Lymphatic:  No anemia, purpura, petechiae. Allergic/Immunologic: no itchy/runny eyes, nasal congestion, recent allergic reactions, rashes  PHYSICAL EXAM: Vitals:   09/07/17 1410  BP: 120/62  Pulse: 76  SpO2: 97%   General: No acute distress, becomes upset and agitated during the visit discussing her memory Head:  Normocephalic/atraumatic Eyes: Fundoscopic exam shows bilateral sharp discs, no vessel changes, exudates, or hemorrhages Neck: supple, no paraspinal tenderness, full range of motion Back: No paraspinal tenderness Heart: regular rate and rhythm Lungs: Clear to auscultation bilaterally. Vascular: No carotid bruits. Skin/Extremities: No rash, no edema Neurological Exam: Mental status: alert and oriented to person, season/day of week, knows we are in [place], states she is in [place]. No dysarthria or aphasia but has word-finding difficulties. She initially states her age is 60, then changes it to 56. Fund of knowledge is reduced.  Recent and remote memory are impaired.  Attention and concentration are reduced.    Able to name objects and repeat phrases. CDT 5/5 MMSE - Mini Mental State Exam 09/07/2017 11/30/2016 07/01/2015    Orientation to time 2 5 5   Orientation to Place 1 5 5   Registration 2 3 3   Attention/ Calculation 2 5 5   Recall 0 0 3  Language- name 2 objects 2 2 2   Language- repeat 1 1 1   Language- follow 3 step command 2 3 3   Language- read &  follow direction 1 1 1   Write a sentence 1 1 1   Copy design 1 0 1  Copy design-comments - difficulty copying the design -  Total score 15 26 30    Cranial nerves: CN I: not tested CN II: pupils equal, round and reactive to light, visual fields intact, fundi unremarkable. CN III, IV, VI:  full range of motion, no nystagmus, no ptosis CN V: facial sensation intact CN VII: upper and lower face symmetric CN VIII: hearing intact to finger rub CN IX, X: gag intact, uvula midline CN XI: sternocleidomastoid and trapezius muscles intact CN XII: tongue midline Bulk & Tone: normal, no fasciculations. Motor: 5/5 throughout with no pronator drift. Sensation: intact to light touch, cold, pin, vibration and joint position sense.  No extinction to double simultaneous stimulation.  Romberg test negative Deep Tendon Reflexes: +2 throughout, no ankle clonus Plantar responses: downgoing bilaterally Cerebellar: no incoordination on finger to nose, heel to shin. No dysdiadochokinesia Gait: narrow-based and steady, able to tandem walk adequately. Tremor: none  IMPRESSION: This is an 83 year old right-handed woman with a history of hypertension, hyperlipidemia, atrial fibrillation on anticoagulation with Xarelto, presenting for evaluation of memory loss. Her neurological exam is non-focal, MMSE today 15/30. MRI brain done 06/24/2017 did not show any acute changes, there was moderate to advanced atrophy, more prominent at the medial temporal lobes. She did have abnormal TSH results, as well as low B12, but symptoms are suggestive of Alzheimer's disease with behavioral disturbance. We discussed the diagnosis, prognosis, and management, and the importance of having more help at home.  She refuses to leave her home or to have people come in, and will need continued discussions regarding home safety. Unfortunately she has minimal insight into her condition which makes it more difficult. We discussed starting Aricept for dementia, side effects were discussed. We discussed needing assistance with medication management, particularly with fluctuating TSH levels, likely indicating difficulty with compliance. Continue to monitor driving, at this point would stop driving. [name] was advised to start looking into Assisted Living facilities. She will follow-up in 6 months and knows to call for any changes.   Thank you for allowing me to participate in the care of this patient. Please do not hesitate to call for any questions or concerns.   Ellouise Newer, M.D.  CC: Dr. Derrel Nip

## 2017-09-07 NOTE — Patient Instructions (Signed)
1. Start Donepezil 10mg : Take 1/2 tablet daily for 2 weeks, then increase to 1 tablet daily 2. Recommend starting to have an aide come in to help you, as well as make sure medications are taken correctly 3. Look into Assisted Living facilities 4. Follow-up in 6 months, call for any changes  FALL PRECAUTIONS: Be cautious when walking. Scan the area for obstacles that may increase the risk of trips and falls. When getting up in the mornings, sit up at the edge of the bed for a few minutes before getting out of bed. Consider elevating the bed at the head end to avoid drop of blood pressure when getting up. Walk always in a well-lit room (use night lights in the walls). Avoid area rugs or power cords from appliances in the middle of the walkways. Use a walker or a cane if necessary and consider physical therapy for balance exercise. Get your eyesight checked regularly.  FINANCIAL OVERSIGHT: Supervision, especially oversight when making financial decisions or transactions is also recommended.  HOME SAFETY: Consider the safety of the kitchen when operating appliances like stoves, microwave oven, and blender. Consider having supervision and share cooking responsibilities until no longer able to participate in those. Accidents with firearms and other hazards in the house should be identified and addressed as well.  DRIVING: Regarding driving, in patients with progressive memory problems, driving will be impaired. We advise to have someone else do the driving if trouble finding directions or if minor accidents are reported. Independent driving assessment is available to determine safety of driving.  ABILITY TO BE LEFT ALONE: If patient is unable to contact 911 operator, consider using LifeLine, or when the need is there, arrange for someone to stay with patients. Smoking is a fire hazard, consider supervision or cessation. Risk of wandering should be assessed by caregiver and if detected at any point,  supervision and safe proof recommendations should be instituted.  MEDICATION SUPERVISION: Inability to self-administer medication needs to be constantly addressed. Implement a mechanism to ensure safe administration of the medications.  RECOMMENDATIONS FOR ALL PATIENTS WITH MEMORY PROBLEMS: 1. Continue to exercise (Recommend 30 minutes of walking everyday, or 3 hours every week) 2. Increase social interactions - continue going to [place] and enjoy social gatherings with friends and family 3. Eat healthy, avoid fried foods and eat more fruits and vegetables 4. Maintain adequate blood pressure, blood sugar, and blood cholesterol level. Reducing the risk of stroke and cardiovascular disease also helps promoting better memory. 5. Avoid stressful situations. Live a simple life and avoid aggravations. Organize your time and prepare for the next day in anticipation. 6. Sleep well, avoid any interruptions of sleep and avoid any distractions in the bedroom that may interfere with adequate sleep quality 7. Avoid sugar, avoid sweets as there is a strong link between excessive sugar intake, diabetes, and cognitive impairment The Mediterranean diet has been shown to help patients reduce the risk of progressive memory disorders and reduces cardiovascular risk. This includes eating fish, eat fruits and green leafy vegetables, nuts like almonds and hazelnuts, walnuts, and also use olive oil. Avoid fast foods and fried foods as much as possible. Avoid sweets and sugar as sugar use has been linked to worsening of memory function.  There is always a concern of gradual progression of memory problems. If this is the case, then we may need to adjust level of care according to patient needs. Support, both to the patient and caregiver, should then be put into place.

## 2017-09-08 ENCOUNTER — Ambulatory Visit: Payer: Medicare Other

## 2017-09-08 ENCOUNTER — Encounter: Payer: Self-pay | Admitting: Neurology

## 2017-09-13 ENCOUNTER — Ambulatory Visit: Payer: Medicare Other

## 2017-09-13 DIAGNOSIS — R5383 Other fatigue: Secondary | ICD-10-CM | POA: Diagnosis not present

## 2017-09-13 DIAGNOSIS — R110 Nausea: Secondary | ICD-10-CM | POA: Diagnosis not present

## 2017-09-13 DIAGNOSIS — R29898 Other symptoms and signs involving the musculoskeletal system: Secondary | ICD-10-CM | POA: Diagnosis not present

## 2017-09-13 DIAGNOSIS — N390 Urinary tract infection, site not specified: Secondary | ICD-10-CM | POA: Diagnosis not present

## 2017-09-14 ENCOUNTER — Other Ambulatory Visit: Payer: Medicare Other

## 2017-09-15 ENCOUNTER — Telehealth: Payer: Self-pay | Admitting: Radiology

## 2017-09-15 ENCOUNTER — Ambulatory Visit: Payer: Medicare Other

## 2017-09-15 DIAGNOSIS — E538 Deficiency of other specified B group vitamins: Secondary | ICD-10-CM

## 2017-09-15 DIAGNOSIS — E032 Hypothyroidism due to medicaments and other exogenous substances: Secondary | ICD-10-CM

## 2017-09-15 DIAGNOSIS — I10 Essential (primary) hypertension: Secondary | ICD-10-CM

## 2017-09-15 DIAGNOSIS — E559 Vitamin D deficiency, unspecified: Secondary | ICD-10-CM

## 2017-09-15 DIAGNOSIS — Z7901 Long term (current) use of anticoagulants: Secondary | ICD-10-CM

## 2017-09-15 NOTE — Telephone Encounter (Signed)
Pt coming in for labs tomorrow, please place future orders. Thank you.  

## 2017-09-15 NOTE — Addendum Note (Signed)
Addended by: Crecencio Mc on: 09/15/2017 09:39 AM   Modules accepted: Orders

## 2017-09-16 ENCOUNTER — Other Ambulatory Visit (INDEPENDENT_AMBULATORY_CARE_PROVIDER_SITE_OTHER): Payer: Medicare Other

## 2017-09-16 DIAGNOSIS — E559 Vitamin D deficiency, unspecified: Secondary | ICD-10-CM

## 2017-09-16 DIAGNOSIS — E538 Deficiency of other specified B group vitamins: Secondary | ICD-10-CM | POA: Diagnosis not present

## 2017-09-16 DIAGNOSIS — I10 Essential (primary) hypertension: Secondary | ICD-10-CM | POA: Diagnosis not present

## 2017-09-16 DIAGNOSIS — E032 Hypothyroidism due to medicaments and other exogenous substances: Secondary | ICD-10-CM

## 2017-09-16 DIAGNOSIS — N183 Chronic kidney disease, stage 3 unspecified: Secondary | ICD-10-CM

## 2017-09-16 LAB — COMPREHENSIVE METABOLIC PANEL
ALT: 7 U/L (ref 0–35)
AST: 14 U/L (ref 0–37)
Albumin: 3.9 g/dL (ref 3.5–5.2)
Alkaline Phosphatase: 66 U/L (ref 39–117)
BUN: 18 mg/dL (ref 6–23)
CO2: 31 mEq/L (ref 19–32)
Calcium: 9.5 mg/dL (ref 8.4–10.5)
Chloride: 104 mEq/L (ref 96–112)
Creatinine, Ser: 1.36 mg/dL — ABNORMAL HIGH (ref 0.40–1.20)
GFR: 39.24 mL/min — ABNORMAL LOW (ref >60.00)
Glucose, Bld: 103 mg/dL — ABNORMAL HIGH (ref 70–99)
Potassium: 3.8 mEq/L (ref 3.5–5.1)
Sodium: 142 mEq/L (ref 135–145)
Total Bilirubin: 0.7 mg/dL (ref 0.2–1.2)
Total Protein: 6.4 g/dL (ref 6.0–8.3)

## 2017-09-16 LAB — T4, FREE: Free T4: 1.13 ng/dL (ref 0.60–1.60)

## 2017-09-16 LAB — VITAMIN D 25 HYDROXY (VIT D DEFICIENCY, FRACTURES): VITD: 37.06 ng/mL (ref 30.00–100.00)

## 2017-09-16 LAB — TSH: TSH: 2.17 u[IU]/mL (ref 0.35–4.50)

## 2017-09-16 LAB — VITAMIN B12: Vitamin B-12: 422 pg/mL (ref 211–911)

## 2017-09-18 DIAGNOSIS — N184 Chronic kidney disease, stage 4 (severe): Secondary | ICD-10-CM | POA: Insufficient documentation

## 2017-09-18 DIAGNOSIS — I129 Hypertensive chronic kidney disease with stage 1 through stage 4 chronic kidney disease, or unspecified chronic kidney disease: Secondary | ICD-10-CM | POA: Insufficient documentation

## 2017-09-18 DIAGNOSIS — N183 Chronic kidney disease, stage 3 (moderate): Secondary | ICD-10-CM

## 2017-09-19 ENCOUNTER — Ambulatory Visit (INDEPENDENT_AMBULATORY_CARE_PROVIDER_SITE_OTHER): Payer: Medicare Other | Admitting: Internal Medicine

## 2017-09-19 ENCOUNTER — Encounter: Payer: Self-pay | Admitting: Internal Medicine

## 2017-09-19 DIAGNOSIS — K219 Gastro-esophageal reflux disease without esophagitis: Secondary | ICD-10-CM | POA: Diagnosis not present

## 2017-09-19 DIAGNOSIS — R600 Localized edema: Secondary | ICD-10-CM | POA: Diagnosis not present

## 2017-09-19 DIAGNOSIS — R809 Proteinuria, unspecified: Secondary | ICD-10-CM | POA: Diagnosis not present

## 2017-09-19 DIAGNOSIS — N2581 Secondary hyperparathyroidism of renal origin: Secondary | ICD-10-CM | POA: Diagnosis not present

## 2017-09-19 DIAGNOSIS — K449 Diaphragmatic hernia without obstruction or gangrene: Secondary | ICD-10-CM

## 2017-09-19 DIAGNOSIS — R5383 Other fatigue: Secondary | ICD-10-CM | POA: Diagnosis not present

## 2017-09-19 DIAGNOSIS — R4189 Other symptoms and signs involving cognitive functions and awareness: Secondary | ICD-10-CM

## 2017-09-19 DIAGNOSIS — N183 Chronic kidney disease, stage 3 unspecified: Secondary | ICD-10-CM

## 2017-09-19 DIAGNOSIS — I10 Essential (primary) hypertension: Secondary | ICD-10-CM | POA: Diagnosis not present

## 2017-09-19 MED ORDER — ESOMEPRAZOLE MAGNESIUM 40 MG PO PACK: 40.0000 mg | PACK | Freq: Every day | ORAL | 12 refills | Status: DC

## 2017-09-19 NOTE — Progress Notes (Signed)
Subjective:  [name], female    DOB: 02-27-1930  Age: 84 y.o. MRN: [id number]  CC: Diagnoses of Hiatal hernia with gastroesophageal reflux, Fatigue, unspecified type, Cognitive decline, and CKD (chronic kidney disease) stage 3, GFR 30-59 ml/min (HCC) were pertinent to this visit.  HPI Alexandra Martinez presents for follow up on cognitive decline , other issues.  She is accompanied by her niece, [name].   She was  recently treated for Urinary urgency  And frequency with indeterminate culture results by [facility] on May 28  After being told she could not be seen here.  culture was indeterminate,  Symptoms were frequency,  Not dysuria.   Had labs done prior to today;  Thyroid function normalized but GFR low at 40 ml/min . Saw nephrology in Jan 2019  Stable per their assessment.    Dementia:  Was seen May 22 br Dr Delice Lesch for confirmation.    Did not tolerate aricept trial ; stopped after 3 days  due to persistent abdominal  Pain.  Her pain has improved but not resolved,  She is no longer taking nexium for chronic dyspepsia for unclear reasons..  Patient refuses to accept the diagnosis of cognitive decline/dementia, refuses to use a pill box or accept assistance from others in managing her medications.  This was discussed at length with her today    ongoing weight loss; unintentional loss of   11 lbs since Feb 25 OV . Niece reports that she complains of  abdominal pain constantly.  Not moving bowels daily   Occasionally constipated . Patient is a poor historian due to dementia, but today is exhibiting an intentionally evasive  attitude .     bp elevated today. Insists that she is taking her medications,  But refuses to use a pill box.  Does not want meals on wheels.   Does not want to be removed from her home to an assisted living situation.  Understands te risk of stroke and death if BP is not controlled.     " I am saved, I'm not afraid of dying;  I don't want to die,  But I  know that we all have to die."   Outpatient Medications Prior to Visit  Medication Sig Dispense Refill  . acetaminophen (TYLENOL) 500 MG tablet Take 500 mg by mouth every 6 (six) hours as needed for headache.    . Esomeprazole Magnesium (NEXIUM PO) Take 20 mg by mouth as needed.     . furosemide (LASIX) 20 MG tablet Take by mouth.    . levothyroxine (SYNTHROID, LEVOTHROID) 50 MCG tablet TAKE 1 TABLET BY MOUTH EVERY DAY 90 tablet 1  . losartan (COZAAR) 100 MG tablet Take 1 tablet (100 mg total) by mouth daily. 90 tablet 1  . metoprolol tartrate (LOPRESSOR) 100 MG tablet TAKE 1 TABLET BY MOUTH TWICE DAILY 180 tablet 0  . ondansetron (ZOFRAN) 4 MG tablet TAKE 1 TABLET BY MOUTH EVERY 8 HOURS AS NEEDED FOR NAUSEA OR VOMITING 30 tablet 0  . polyethylene glycol powder (GLYCOLAX/MIRALAX) powder Take 17 g by mouth daily as needed for mild constipation. 3350 g 1  . pravastatin (PRAVACHOL) 20 MG tablet TAKE 1 TABLET(20 MG) BY MOUTH EVERY NIGHT 30 tablet 2  . Rivaroxaban (XARELTO) 15 MG TABS tablet Take 15 mg by mouth daily with supper.    . Simethicone (EQ GAS RELIEF) 125 MG CAPS Take 125 mg by mouth daily as needed (gas). Reported on 07/01/2015    .  traMADol (ULTRAM) 50 MG tablet Take 1 tablet (50 mg total) by mouth every 8 (eight) hours as needed. 90 tablet 5  . donepezil (ARICEPT) 10 MG tablet Take 1/2 tablet daily for 2 weeks, then increase to 1 tablet daily and continue (Patient not taking: Reported on 09/19/2017) 30 tablet 11   Facility-Administered Medications Prior to Visit  Medication Dose Route Frequency Provider Last Rate Last Dose  . cyanocobalamin ((VITAMIN B-12)) injection 1,000 mcg  1,000 mcg Intramuscular Once Crecencio Mc, MD        Review of Systems;  Patient denies headache, fevers, malaise, unintentional weight loss, skin rash, eye pain, sinus congestion and sinus pain, sore throat, dysphagia,  hemoptysis , cough, dyspnea, wheezing, chest pain, palpitations, orthopnea, edema,   nausea, melena, diarrhea, constipation, flank pain, dysuria, hematuria, urinary  Frequency, nocturia, numbness, tingling, seizures,  Focal weakness, Loss of consciousness,  Tremor, insomnia, depression, anxiety, and suicidal ideation.      Objective:  BP (!) 182/70 (BP Location: Left Arm, Patient Position: Sitting, Cuff Size: Normal)   Pulse 95   Temp 97.7 F (36.5 C) (Oral)   Resp 15   Ht 5\' 3"  (1.6 m)   Wt 141 lb 6.4 oz (64.1 kg)   SpO2 91%   BMI 25.05 kg/m   BP Readings from Last 3 Encounters:  09/19/17 (!) 182/70  09/07/17 120/62  07/27/17 (!) 160/88    Wt Readings from Last 3 Encounters:  09/19/17 141 lb 6.4 oz (64.1 kg)  09/07/17 141 lb (64 kg)  07/20/17 148 lb (67.1 kg)    General appearance: alert, cooperative and appears stated age Ears: normal TM's and external ear canals both ears Throat: lips, mucosa, and tongue normal; teeth and gums normal Neck: no adenopathy, no carotid bruit, supple, symmetrical, trachea midline and thyroid not enlarged, symmetric, no tenderness/mass/nodules Back: symmetric, no curvature. ROM normal. No CVA tenderness. Lungs: clear to auscultation bilaterally Heart: regular rate and rhythm, S1, S2 normal, no murmur, click, rub or gallop Abdomen: soft, non-tender; bowel sounds normal; no masses,  no organomegaly Pulses: 2+ and symmetric Skin: Skin color, texture, turgor normal. No rashes or lesions Lymph nodes: Cervical, supraclavicular, and axillary nodes normal.  No results found for: HGBA1C  Lab Results  Component Value Date   CREATININE 1.36 (H) 09/16/2017   CREATININE 1.25 (H) 07/27/2017   CREATININE 1.10 (H) 06/03/2017    Lab Results  Component Value Date   WBC 5.9 06/03/2017   HGB 11.4 (L) 06/03/2017   HCT 34.4 (L) 06/03/2017   PLT 162 06/03/2017   GLUCOSE 103 (H) 09/16/2017   CHOL 217 (H) 07/16/2013   TRIG 88.0 07/16/2013   HDL 41.70 07/16/2013   LDLCALC 158 (H) 07/16/2013   ALT 7 09/16/2017   AST 14 09/16/2017   NA  142 09/16/2017   K 3.8 09/16/2017   CL 104 09/16/2017   CREATININE 1.36 (H) 09/16/2017   BUN 18 09/16/2017   CO2 31 09/16/2017   TSH 2.17 09/16/2017   INR 1.6 (H) 08/04/2015    Mr Brain Wo Contrast  Result Date: 06/24/2017 CLINICAL DATA:  Cognitive changes.  Cognitive decline. EXAM: MRI HEAD WITHOUT CONTRAST TECHNIQUE: Multiplanar, multiecho pulse sequences of the brain and surrounding structures were obtained without intravenous contrast. COMPARISON:  None available FINDINGS: Brain: No hydrocephalus, collection, mass, or abnormal diffusion. Mild-to-moderate atrophy. Notable atrophy in the medial temporal lobes that is moderate to advanced, greater on the right. Alzheimer's disease could give this pattern. Mild for age  chronic microvascular ischemic type change in the periventricular white matter. No abnormal mineralization. No generalized chronic hemorrhagic foci. Vascular: Major flow voids are preserved. Developmental and incidental vertical course of the right proximal transverse sinus from the superior sagittal sinus. The left transverse sinus is in continuity with the straight sinus. Skull and upper cervical spine: No evidence of marrow lesion. Cervical facet spurring. Sinuses/Orbits: Bilateral cataract resection. IMPRESSION: 1. No reversible finding to explain memory loss. 2. Atrophy that is moderate to advanced at the medial temporal lobes. 3. Mild chronic small vessel ischemia. Electronically Signed   By: Monte Fantasia M.D.   On: 06/24/2017 13:27    Assessment & Plan:   Problem List Items Addressed This Visit    Hiatal hernia with gastroesophageal reflux    Likely the cause of her current symptoms which are chronic but are not currently managed due to lapse in Nexium.  Rx for 40 mg dose sent to pharmacy. Given her age and cognitive decline,  No further workup is advised.       Relevant Medications   esomeprazole (NEXIUM) 40 MG packet   Fatigue    Persistent complaint  With screening  labs notable for B12 deficiency and hypothyroid,  Both were noted in February and have been  Addressed with B12 injections and increase in levothyroxine dose to 75 mcg daily .  cardiology evaluation with Holter monitor showed controlled atrial fibrillation.  She has chronic low back pain which may be contributing.   Lab Results  Component Value Date   TSH 2.17 09/16/2017   Lab Results  Component Value Date   [id number] 09/16/2017         Cognitive decline    Recent neurology evaluation noted a MMSE score of 15/30 and a tentative diagnosis of Alzheimers Dementia with behavioral changes was made.  Patient's niece [name] has POA and was advised by Dr Delice Lesch to begin search for assisted living facility , and to dissuade patient from driving.  The suggestion of meal supplementation with Meals on Wheels was rejected ; [name] was encouraged to stock patient's refrigerator with health choice entrees and other prepared meals .       CKD (chronic kidney disease) stage 3, GFR 30-59 ml/min (HCC)    No significant change since Jan 2019 per nephrology evaluation         A total of 40 minutes was spent with patient more than half of which was spent in counseling patient on the above mentioned issues , reviewing and explaining recent labs and imaging studies done, and coordination of care.  I have discontinued Arlana Pouch. Auxier's donepezil. I am also having her start on esomeprazole. Additionally, I am having her maintain her Simethicone, acetaminophen, Esomeprazole Magnesium (NEXIUM PO), polyethylene glycol powder, Rivaroxaban, furosemide, pravastatin, metoprolol tartrate, ondansetron, losartan, levothyroxine, and traMADol. We will continue to administer cyanocobalamin.  Meds ordered this encounter  Medications  . esomeprazole (NEXIUM) 40 MG packet    Sig: Take 40 mg by mouth daily before breakfast.    Dispense:  30 each    Refill:  12    Medications Discontinued During This Encounter    Medication Reason  . donepezil (ARICEPT) 10 MG tablet Patient has not taken in last 30 days    Follow-up: Return in about 1 month (around 10/17/2017) for follow up on blood pressure,  Dyspepsia .   Crecencio Mc, MD

## 2017-09-19 NOTE — Patient Instructions (Addendum)
You are forgetting to eat and forgetting to take your medications   Please resume Nexium for your stomach  I have sent it to your pharmacy    You need to Use a pill box to make sure you are taking your medications correctly  ( AM and PM )   I am asking  the home health nurse  To call you to set up a time to come out and help you set up your pill  box every week, to make sure you are eating,    And to check your blood pressure

## 2017-09-20 ENCOUNTER — Other Ambulatory Visit: Payer: Self-pay | Admitting: Internal Medicine

## 2017-09-20 NOTE — Assessment & Plan Note (Signed)
Persistent complaint  With screening labs notable for B12 deficiency and hypothyroid,  Both were noted in February and have been  Addressed with B12 injections and increase in levothyroxine dose to 75 mcg daily .  cardiology evaluation with Holter monitor showed controlled atrial fibrillation.  She has chronic low back pain which may be contributing.   Lab Results  Component Value Date   TSH 2.17 09/16/2017   Lab Results  Component Value Date   [id number] 09/16/2017

## 2017-09-20 NOTE — Assessment & Plan Note (Signed)
No significant change since Jan 2019 per nephrology evaluation

## 2017-09-20 NOTE — Assessment & Plan Note (Addendum)
Recent neurology evaluation noted a MMSE score of 15/30 and a tentative diagnosis of Alzheimers Dementia with behavioral changes was made.  Patient's niece [name] has POA and was advised by Dr Delice Lesch to begin search for assisted living facility , and to dissuade patient from driving.  The suggestion of meal supplementation with Meals on Wheels was rejected ; [name] was encouraged to stock patient's refrigerator with health choice entrees and other prepared meals .

## 2017-09-20 NOTE — Assessment & Plan Note (Signed)
Likely the cause of her current symptoms which are chronic but are not currently managed due to lapse in Nexium.  Rx for 40 mg dose sent to pharmacy. Given her age and cognitive decline,  No further workup is advised.

## 2017-10-17 ENCOUNTER — Other Ambulatory Visit: Payer: Self-pay | Admitting: Internal Medicine

## 2017-10-25 ENCOUNTER — Ambulatory Visit: Payer: Medicare Other | Admitting: Internal Medicine

## 2017-10-26 ENCOUNTER — Ambulatory Visit (INDEPENDENT_AMBULATORY_CARE_PROVIDER_SITE_OTHER): Payer: Medicare Other | Admitting: Internal Medicine

## 2017-10-26 ENCOUNTER — Encounter: Payer: Self-pay | Admitting: Internal Medicine

## 2017-10-26 DIAGNOSIS — I38 Endocarditis, valve unspecified: Secondary | ICD-10-CM | POA: Diagnosis not present

## 2017-10-26 DIAGNOSIS — I10 Essential (primary) hypertension: Secondary | ICD-10-CM | POA: Diagnosis not present

## 2017-10-26 DIAGNOSIS — N183 Chronic kidney disease, stage 3 (moderate): Secondary | ICD-10-CM | POA: Diagnosis not present

## 2017-10-26 DIAGNOSIS — E782 Mixed hyperlipidemia: Secondary | ICD-10-CM | POA: Diagnosis not present

## 2017-10-26 DIAGNOSIS — F03B0 Unspecified dementia, moderate, without behavioral disturbance, psychotic disturbance, mood disturbance, and anxiety: Secondary | ICD-10-CM

## 2017-10-26 DIAGNOSIS — F0390 Unspecified dementia without behavioral disturbance: Secondary | ICD-10-CM | POA: Diagnosis not present

## 2017-10-26 DIAGNOSIS — I482 Chronic atrial fibrillation: Secondary | ICD-10-CM | POA: Diagnosis not present

## 2017-10-26 MED ORDER — ESOMEPRAZOLE MAGNESIUM 40 MG PO CPDR: 40.0000 mg | DELAYED_RELEASE_CAPSULE | Freq: Every day | ORAL | 0 refills | Status: DC

## 2017-10-26 NOTE — Patient Instructions (Addendum)
Take your thyroid pill and your Nexium with water before breakfast  To reduce your nausea   Take your other medications after you eat  Breakfast   I'm glad you are not losing weight  I have refilled your generic Nexium 40 mg daily   I'll see you in  4  months

## 2017-10-26 NOTE — Progress Notes (Signed)
Subjective:  [name], female    DOB: 16-Feb-1930  Age: 84 y.o. MRN: [id number]  CC: Diagnoses of Essential hypertension and Moderate dementia without behavioral disturbance were pertinent to this visit.  HPI TRUC WINFREE presents for one month  follow up on hypertension , hyperthyroidism and new onset dementia. Patient continues to live alone by preferene and t is accompanied today by her next of kin,  [name] , her niece. .    She has no new complaints,  But reports that she continues to feel nauseate every morning, without emesis.  She states that she is taking her nexium and all of her other medications in the morning on an empty stomach and refuses to use a pill box or accept any outside  Assistance other than  what ellen is willing to provide.  She eats out 3-4 times per week with [name].  She does not cook otherwise,  Except to heat up soup or a microwavable dinner. Her weight has been stable for the past  3 months,  But she has lost a total of 18 lbs over the last year, unintentionally .Marland Kitchen        Outpatient Medications Prior to Visit  Medication Sig Dispense Refill  . acetaminophen (TYLENOL) 500 MG tablet Take 500 mg by mouth every 6 (six) hours as needed for headache.    . furosemide (LASIX) 20 MG tablet Take by mouth.    . levothyroxine (SYNTHROID, LEVOTHROID) 50 MCG tablet TAKE 1 TABLET BY MOUTH EVERY DAY 90 tablet 1  . losartan (COZAAR) 100 MG tablet Take 1 tablet (100 mg total) by mouth daily. 90 tablet 1  . metoprolol tartrate (LOPRESSOR) 100 MG tablet TAKE 1 TABLET BY MOUTH TWICE DAILY 180 tablet 0  . ondansetron (ZOFRAN) 4 MG tablet TAKE 1 TABLET BY MOUTH EVERY 8 HOURS AS NEEDED FOR NAUSEA OR VOMITING 30 tablet 0  . polyethylene glycol powder (GLYCOLAX/MIRALAX) powder Take 17 g by mouth daily as needed for mild constipation. 3350 g 1  . pravastatin (PRAVACHOL) 20 MG tablet TAKE 1 TABLET(20 MG) BY MOUTH EVERY NIGHT 90 tablet 1  . Rivaroxaban (XARELTO) 15 MG  TABS tablet Take 15 mg by mouth daily with supper.    . Simethicone (EQ GAS RELIEF) 125 MG CAPS Take 125 mg by mouth daily as needed (gas). Reported on 07/01/2015    . traMADol (ULTRAM) 50 MG tablet Take 1 tablet (50 mg total) by mouth every 8 (eight) hours as needed. 90 tablet 5  . esomeprazole (NEXIUM) 40 MG packet Take 40 mg by mouth daily before breakfast. 30 each 12  . Esomeprazole Magnesium (NEXIUM PO) Take 20 mg by mouth as needed.      Facility-Administered Medications Prior to Visit  Medication Dose Route Frequency Provider Last Rate Last Dose  . cyanocobalamin ((VITAMIN B-12)) injection 1,000 mcg  1,000 mcg Intramuscular Once Crecencio Mc, MD        Review of Systems;  Patient denies headache, fevers, malaise, unintentional weight loss, skin rash, eye pain, sinus congestion and sinus pain, sore throat, dysphagia,  hemoptysis , cough, dyspnea, wheezing, chest pain, palpitations, orthopnea, edema, abdominal pain, nausea, melena, diarrhea, constipation, flank pain, dysuria, hematuria, urinary  Frequency, nocturia, numbness, tingling, seizures,  Focal weakness, Loss of consciousness,  Tremor, insomnia, depression, anxiety, and suicidal ideation.      Objective:  BP 140/68 (BP Location: Left Arm, Patient Position: Sitting, Cuff Size: Normal)   Pulse 84   Temp  97.8 F (36.6 C) (Oral)   Resp 15   Ht 5\' 3"  (1.6 m)   Wt 141 lb 3.2 oz (64 kg)   SpO2 98%   BMI 25.01 kg/m   BP Readings from Last 3 Encounters:  10/26/17 140/68  09/19/17 (!) 182/70  09/07/17 120/62    Wt Readings from Last 3 Encounters:  10/26/17 141 lb 3.2 oz (64 kg)  09/19/17 141 lb 6.4 oz (64.1 kg)  09/07/17 141 lb (64 kg)    General appearance: alert, cooperative and appears stated age Ears: normal TM's and external ear canals both ears Throat: lips, mucosa, and tongue normal; teeth and gums normal Neck: no adenopathy, no carotid bruit, supple, symmetrical, trachea midline and thyroid not enlarged,  symmetric, no tenderness/mass/nodules Back: symmetric, no curvature. ROM normal. No CVA tenderness. Lungs: clear to auscultation bilaterally Heart: regular rate and rhythm, S1, S2 normal, no murmur, click, rub or gallop Abdomen: soft, non-tender; bowel sounds normal; no masses,  no organomegaly Pulses: 2+ and symmetric Skin: Skin color, texture, turgor normal. No rashes or lesions Lymph nodes: Cervical, supraclavicular, and axillary nodes normal.  No results found for: HGBA1C  Lab Results  Component Value Date   CREATININE 1.36 (H) 09/16/2017   CREATININE 1.25 (H) 07/27/2017   CREATININE 1.10 (H) 06/03/2017    Lab Results  Component Value Date   WBC 5.9 06/03/2017   HGB 11.4 (L) 06/03/2017   HCT 34.4 (L) 06/03/2017   PLT 162 06/03/2017   GLUCOSE 103 (H) 09/16/2017   CHOL 217 (H) 07/16/2013   TRIG 88.0 07/16/2013   HDL 41.70 07/16/2013   LDLCALC 158 (H) 07/16/2013   ALT 7 09/16/2017   AST 14 09/16/2017   NA 142 09/16/2017   K 3.8 09/16/2017   CL 104 09/16/2017   CREATININE 1.36 (H) 09/16/2017   BUN 18 09/16/2017   CO2 31 09/16/2017   TSH 2.17 09/16/2017   INR 1.6 (H) 08/04/2015    Mr Brain Wo Contrast  Result Date: 06/24/2017 CLINICAL DATA:  Cognitive changes.  Cognitive decline. EXAM: MRI HEAD WITHOUT CONTRAST TECHNIQUE: Multiplanar, multiecho pulse sequences of the brain and surrounding structures were obtained without intravenous contrast. COMPARISON:  None available FINDINGS: Brain: No hydrocephalus, collection, mass, or abnormal diffusion. Mild-to-moderate atrophy. Notable atrophy in the medial temporal lobes that is moderate to advanced, greater on the right. Alzheimer's disease could give this pattern. Mild for age chronic microvascular ischemic type change in the periventricular white matter. No abnormal mineralization. No generalized chronic hemorrhagic foci. Vascular: Major flow voids are preserved. Developmental and incidental vertical course of the right proximal  transverse sinus from the superior sagittal sinus. The left transverse sinus is in continuity with the straight sinus. Skull and upper cervical spine: No evidence of marrow lesion. Cervical facet spurring. Sinuses/Orbits: Bilateral cataract resection. IMPRESSION: 1. No reversible finding to explain memory loss. 2. Atrophy that is moderate to advanced at the medial temporal lobes. 3. Mild chronic small vessel ischemia. Electronically Signed   By: Monte Fantasia M.D.   On: 06/24/2017 13:27    Assessment & Plan:   Problem List Items Addressed This Visit    Essential hypertension    Improved without medication change .  Continue losartan 100 mg daily , metoprolol 100 mg bid  Lab Results  Component Value Date   CREATININE 1.36 (H) 09/16/2017   Lab Results  Component Value Date   NA 142 09/16/2017   K 3.8 09/16/2017   CL 104 09/16/2017  CO2 31 09/16/2017         Moderate dementia without behavioral disturbance    Confirmed by Neurology.  Did not tolerate Aricept.  Advised to hire a home health aide,  Which she has not done.  I have advised [name] that patient currently has capacity to make informed decisions based on recent discussion.  She understands that if she does not take her medications she can have a stroke or heart attack (her response was, "well we all have to die,  And I'm not afraid of dying at this point in my life.") she is not a hazard to herself at this point in time. Continue to encourage her to consider transition to assisted living.          I have discontinued Arlana Pouch. Weesner's Esomeprazole Magnesium (NEXIUM PO) and esomeprazole. I am also having her start on esomeprazole. Additionally, I am having her maintain her Simethicone, acetaminophen, polyethylene glycol powder, Rivaroxaban, furosemide, ondansetron, losartan, levothyroxine, traMADol, metoprolol tartrate, and pravastatin. We will continue to administer cyanocobalamin.  Meds ordered this encounter  Medications    . esomeprazole (NEXIUM) 40 MG capsule    Sig: Take 1 capsule (40 mg total) by mouth daily at 12 noon.    Dispense:  90 capsule    Refill:  0    Medications Discontinued During This Encounter  Medication Reason  . esomeprazole (NEXIUM) 40 MG packet   . Esomeprazole Magnesium (NEXIUM PO)     Follow-up: Return in about 4 months (around 02/26/2018).   Crecencio Mc, MD

## 2017-10-29 DIAGNOSIS — F03B0 Unspecified dementia, moderate, without behavioral disturbance, psychotic disturbance, mood disturbance, and anxiety: Secondary | ICD-10-CM | POA: Insufficient documentation

## 2017-10-29 DIAGNOSIS — F0390 Unspecified dementia without behavioral disturbance: Secondary | ICD-10-CM | POA: Insufficient documentation

## 2017-10-29 NOTE — Assessment & Plan Note (Addendum)
Improved without medication change .  Continue losartan 100 mg daily , metoprolol 100 mg bid  Lab Results  Component Value Date   CREATININE 1.36 (H) 09/16/2017   Lab Results  Component Value Date   NA 142 09/16/2017   K 3.8 09/16/2017   CL 104 09/16/2017   CO2 31 09/16/2017

## 2017-10-29 NOTE — Assessment & Plan Note (Signed)
Confirmed by Neurology.  Did not tolerate Aricept.  Advised to hire a home health aide,  Which she has not done.  I have advised Alexandra Martinez that patient currently has capacity to make informed decisions based on recent discussion.  She understands that if she does not take her medications she can have a stroke or heart attack (her response was, "well we all have to die,  And I'm not afraid of dying at this point in my life.") she is not a hazard to herself at this point in time. Continue to encourage her to consider transition to assisted living.

## 2017-11-12 ENCOUNTER — Other Ambulatory Visit: Payer: Self-pay | Admitting: Internal Medicine

## 2017-11-25 DIAGNOSIS — M5416 Radiculopathy, lumbar region: Secondary | ICD-10-CM | POA: Diagnosis not present

## 2017-11-25 DIAGNOSIS — M5136 Other intervertebral disc degeneration, lumbar region: Secondary | ICD-10-CM | POA: Diagnosis not present

## 2017-12-01 ENCOUNTER — Ambulatory Visit (INDEPENDENT_AMBULATORY_CARE_PROVIDER_SITE_OTHER): Payer: Medicare Other

## 2017-12-01 VITALS — BP 130/80 | HR 60 | Temp 98.2°F | Resp 15 | Ht 63.0 in | Wt 139.8 lb

## 2017-12-01 DIAGNOSIS — Z0000 Encounter for general adult medical examination without abnormal findings: Secondary | ICD-10-CM | POA: Diagnosis not present

## 2017-12-01 NOTE — Progress Notes (Addendum)
Subjective:   Alexandra Martinez is a 84 y.o. female who presents for Medicare Annual (Subsequent) preventive examination.  Review of Systems:  No ROS.  Medicare Wellness Visit. Additional risk factors are reflected in the social history. Cardiac Risk Factors include: advanced age (>79men, >65 women);hypertension     Objective:     Vitals: BP 130/80 (BP Location: Left Arm, Patient Position: Sitting, Cuff Size: Normal)   Pulse 60   Temp 98.2 F (36.8 C) (Oral)   Resp 15   Ht 5\' 3"  (1.6 m)   Wt 139 lb 12.8 oz (63.4 kg)   SpO2 98%   BMI 24.76 kg/m   Body mass index is 24.76 kg/m.  Advanced Directives 12/01/2017 03/15/2017 11/30/2016 09/05/2016 01/05/2016 07/01/2015 01/14/2014  Does Patient Have a Medical Advance Directive? Yes No No No No Yes No  Type of Advance Directive Healthcare Power of [place] -  Does patient want to make changes to medical advance directive? No - Patient declined - - - - - -  Copy of [place] in Chart? No - copy requested - - - - No - copy requested -  Would patient like information on creating a medical advance directive? - - Yes (MAU/Ambulatory/Procedural Areas - Information given) No - Patient declined No - patient declined information - -    Tobacco Social History   Tobacco Use  Smoking Status Never Smoker  Smokeless Tobacco Never Used     Counseling given: Not Answered   Clinical Intake:  Pre-visit preparation completed: Yes  Pain : No/denies pain     Nutritional Status: BMI of 19-24  Normal Diabetes: No  How often do you need to have someone help you when you read instructions, pamphlets, or other written materials from your doctor or pharmacy?: 1 - Never  Interpreter Needed?: No     Past Medical History:  Diagnosis Date  . Anemia    r/t uterine fibroids  . Atrial fib/flutter, transient    [facility] admission for RVR 03/2009  . DDD (degenerative disc disease)   . Dysrhythmia    A-FIB  . GERD (gastroesophageal reflux disease)   . H/O bladder infections   . H/O hiatal hernia   . H/O thyroid nodule    surgically removed  . Heart murmur    moderate MR/TR 08/2013 echo [name])  . Hemorrhoids   . Hiatal hernia with gastroesophageal reflux 2008   by EGD,  [name]  . High cholesterol   . History of uterine fibroid   . Hydronephrosis    uretero- with congenital UPJ obstruction- Dr. Edrick Oh  . Hydronephrosis with ureteropelvic junction obstruction   . Hypertension    [facility]  . Hypothyroidism   . Nocturia   . Osteoporosis   . Polyuria   . Recurrent UTI    Past Surgical History:  Procedure Laterality Date  . ADENOIDECTOMY N/A 01/05/2016   Procedure: ADENOIDECTOMY;  Surgeon: Margaretha Sheffield, MD;  Location: [facility] ORS;  Service: ENT;  Laterality: N/A;  . BACK SURGERY    . CATARACT EXTRACTION     left  . CERVICAL DISC SURGERY  1980's  . CHOLECYSTECTOMY    . NASOPHARYNGOSCOPY  01/05/2016   Procedure: NASOPHARYNGOSCOPY;  Surgeon: Margaretha Sheffield, MD;  Location: [facility] ORS;  Service: ENT;;  . THYROID LOBECTOMY     partial, right lobe  . TOTAL ABDOMINAL HYSTERECTOMY W/ BILATERAL SALPINGOOPHORECTOMY     Family History  Problem Relation  Age of Onset  . Heart disease Mother   . Coronary artery disease Father        MI's   Social History   Socioeconomic History  . Marital status: Widowed    Spouse name: Not on file  . Number of children: Not on file  . Years of education: Not on file  . Highest education level: Not on file  Occupational History  . Not on file  Social Needs  . Financial resource strain: Not very hard  . Food insecurity:    Worry: Never true    Inability: Never true  . Transportation needs:    Medical: No    Non-medical: No  Tobacco Use  . Smoking status: Never Smoker  . Smokeless tobacco: Never Used  Substance and Sexual Activity  . Alcohol use: No  . Drug use: No  . Sexual activity: Never  Lifestyle  . Physical activity:     Days per week: 0 days    Minutes per session: Not on file  . Stress: Not at all  Relationships  . Social connections:    Talks on phone: More than three times a week    Gets together: Three times a week    Attends religious service: More than 4 times per year    Active member of club or organization: Yes    Attends meetings of clubs or organizations: Never    Relationship status: Widowed  Other Topics Concern  . Not on file  Social History Narrative   Lives alone, widowed in 2005.   Both son's have passed away   07-16-2022 grade education   Retired Engineer, manufacturing systems    Outpatient Encounter Medications as of 12/01/2017  Medication Sig  . acetaminophen (TYLENOL) 500 MG tablet Take 500 mg by mouth every 6 (six) hours as needed for headache.  . esomeprazole (NEXIUM) 40 MG capsule Take 1 capsule (40 mg total) by mouth daily at 12 noon.  . furosemide (LASIX) 20 MG tablet Take by mouth.  . levothyroxine (SYNTHROID, LEVOTHROID) 50 MCG tablet TAKE 1 TABLET BY MOUTH EVERY DAY  . losartan (COZAAR) 100 MG tablet TAKE 1 TABLET(100 MG) BY MOUTH DAILY  . metoprolol tartrate (LOPRESSOR) 100 MG tablet TAKE 1 TABLET BY MOUTH TWICE DAILY  . ondansetron (ZOFRAN) 4 MG tablet TAKE 1 TABLET BY MOUTH EVERY 8 HOURS AS NEEDED FOR NAUSEA OR VOMITING  . polyethylene glycol powder (GLYCOLAX/MIRALAX) powder Take 17 g by mouth daily as needed for mild constipation.  . pravastatin (PRAVACHOL) 20 MG tablet TAKE 1 TABLET(20 MG) BY MOUTH EVERY NIGHT  . Rivaroxaban (XARELTO) 15 MG TABS tablet Take 15 mg by mouth daily with supper.  . Simethicone (EQ GAS RELIEF) 125 MG CAPS Take 125 mg by mouth daily as needed (gas). Reported on 07/01/2015  . traMADol (ULTRAM) 50 MG tablet Take 1 tablet (50 mg total) by mouth every 8 (eight) hours as needed.   Facility-Administered Encounter Medications as of 12/01/2017  Medication  . cyanocobalamin ((VITAMIN B-12)) injection 1,000 mcg    Activities of Daily Living In your present state  of health, do you have any difficulty performing the following activities: 12/01/2017  Hearing? N  Vision? N  Difficulty concentrating or making decisions? Y  Comment Dx of moderate dementia  Walking or climbing stairs? Y  Dressing or bathing? Y  Comment She makes sure someone is in the home before bathing  Doing errands, shopping? Y  Comment Limits her driving to specific destinations  Preparing  Food and eating ? Y  Comment She does not cook.  She heats food up in a microwave.   Using the Toilet? N  In the past six months, have you accidently leaked urine? Y  Comment Managed with a brief  Do you have problems with loss of bowel control? N  Managing your Medications? N  Managing your Finances? Anthony Sar and nephew assists  Housekeeping or managing your Housekeeping? Anthony Sar and nephew assists  Some recent data might be hidden    Patient Care Team: Crecencio Mc, MD as PCP - General (Internal Medicine)    Assessment:   This is a routine wellness examination for Lounette.  The goal of the wellness visit is to assist the patient how to close the gaps in care and create a preventative care plan for the patient.   The roster of all physicians providing medical care to patient is listed in the Snapshot section of the chart.  Osteoporosis risk reviewed.    Safety issues reviewed; Smoke and carbon monoxide detectors in the home. No firearms in the home. Wears seatbelts when driving or riding with others. No violence in the home.  They do not have excessive sun exposure.  Discussed the need for sun protection: hats, long sleeves and the use of sunscreen if there is significant sun exposure.  BMI- discussed the importance of a healthy diet, water intake and the benefits of aerobic exercise. She eats out with her friend [name] several times a week or heats food up in a microwave. Stays active around the home but encouraged to walk as tolerated. She drinks plenty of fluids.     24 hour diet recall: Regular diet  Dental- UTD.  Sleep patterns- Sleeps with no issues.   TDAP vaccine deferred per patient preference.    She is considering home health assistance but not open to the idea of assisted living.   Pill box in use when taking medications. Unclear in answer when asked if she was taking thyroid pill and nexium before breakfast and other medications after breakfast.   MMSE- followed by Neurology.  Moderate dementia. Assessed within the last 3 months at 59.   Patient Concerns: None at this time. Follow up with PCP as needed.  Exercise Activities and Dietary recommendations Current Exercise Habits: The patient does not participate in regular exercise at present  Goals    . Brain Engagement Activity       Fall Risk Fall Risk  12/01/2017 11/30/2016 07/23/2016 07/01/2015 07/05/2014  Falls in the past year? No No No No No   Depression Screen PHQ 2/9 Scores 12/01/2017 08/12/2017 11/30/2016 07/23/2016  PHQ - 2 Score 0 0 0 1  PHQ- 9 Score - - 0 -     Cognitive Function MMSE - Mini Mental State Exam 12/01/2017 09/07/2017 11/30/2016 07/01/2015  Not completed: Unable to complete - - -  Orientation to time - 2 5 5   Orientation to Place - 1 5 5   Registration - 2 3 3   Attention/ Calculation - 2 5 5   Recall - 0 0 3  Language- name 2 objects - 2 2 2   Language- repeat - 1 1 1   Language- follow 3 step command - 2 3 3   Language- read & follow direction - 1 1 1   Write a sentence - 1 1 1   Copy design - 1 0 1  Copy design-comments - - difficulty copying the design -  Total score - 15  26 30        Immunization History  Administered Date(s) Administered  . Influenza Split 02/22/2011, 01/14/2012  . Influenza, High Dose Seasonal PF 02/03/2015  . Influenza,inj,Quad PF,6+ Mos 02/08/2013, 01/10/2014  . Influenza-Unspecified 01/14/2017  . Pneumococcal Conjugate-13 02/03/2015  . Pneumococcal Polysaccharide-23 05/14/2004   Screening Tests Health Maintenance  Topic  Date Due  . TETANUS/TDAP  04/10/1949  . INFLUENZA VACCINE  11/17/2017  . DEXA SCAN  Completed  . PNA vac Low Risk Adult  Completed      Plan:   End of life planning; Advance aging; Advanced directives discussed. Copy of current HCPOA/Living Will requested.    I have personally reviewed and noted the following in the patient's chart:   . Medical and social history . Use of alcohol, tobacco or illicit drugs  . Current medications and supplements . Functional ability and status . Nutritional status . Physical activity . Advanced directives . List of other physicians . Hospitalizations, surgeries, and ER visits in previous 12 months . Vitals . Screenings to include cognitive, depression, and falls . Referrals and appointments  In addition, I have reviewed and discussed with patient certain preventive protocols, quality metrics, and best practice recommendations. A written personalized care plan for preventive services as well as general preventive health recommendations were provided to patient.     OBrien-Blaney, Denisa L, LPN  07/08/2246    I have reviewed the above information and agree with above.   Deborra Medina, MD

## 2017-12-01 NOTE — Patient Instructions (Addendum)
  Ms. Pol , Thank you for taking time to come for your Medicare Wellness Visit. I appreciate your ongoing commitment to your health goals. Please review the following plan we discussed and let me know if I can assist you in the future.   These are the goals we discussed: Goals    . Brain Engagement Activity       This is a list of the screening recommended for you and due dates:  Health Maintenance  Topic Date Due  . Tetanus Vaccine  04/10/1949  . Flu Shot  11/17/2017  . DEXA scan (bone density measurement)  Completed  . Pneumonia vaccines  Completed

## 2017-12-12 ENCOUNTER — Other Ambulatory Visit: Payer: Self-pay | Admitting: Internal Medicine

## 2017-12-20 ENCOUNTER — Telehealth: Payer: Self-pay | Admitting: Internal Medicine

## 2017-12-20 ENCOUNTER — Other Ambulatory Visit: Payer: Self-pay

## 2017-12-20 MED ORDER — ESOMEPRAZOLE MAGNESIUM 40 MG PO CPDR: 40.0000 mg | DELAYED_RELEASE_CAPSULE | Freq: Every day | ORAL | 0 refills | Status: DC

## 2017-12-20 NOTE — Telephone Encounter (Signed)
Copied from [place] [id number]. Topic: Quick Communication - Rx Refill/Question >> Dec 20, 2017 12:06 PM Alexandra Martinez B wrote: Medication: esomeprazole (NEXIUM) 40 MG capsule [[id number]]   Has the patient contacted their pharmacy? Yes.   (Agent: If no, request that the patient contact the pharmacy for the refill.) (Agent: If yes, when and what did the pharmacy advise?)  Preferred Pharmacy (with phone number or street name): [facility]  Agent: Please be advised that RX refills may take up to 3 business days. We ask that you follow-up with your pharmacy.

## 2017-12-20 NOTE — Telephone Encounter (Signed)
rx refilled.

## 2018-01-12 DIAGNOSIS — N183 Chronic kidney disease, stage 3 (moderate): Secondary | ICD-10-CM | POA: Diagnosis not present

## 2018-01-12 DIAGNOSIS — N2581 Secondary hyperparathyroidism of renal origin: Secondary | ICD-10-CM | POA: Diagnosis not present

## 2018-01-12 DIAGNOSIS — D631 Anemia in chronic kidney disease: Secondary | ICD-10-CM | POA: Diagnosis not present

## 2018-01-12 DIAGNOSIS — I10 Essential (primary) hypertension: Secondary | ICD-10-CM | POA: Diagnosis not present

## 2018-01-12 DIAGNOSIS — R600 Localized edema: Secondary | ICD-10-CM | POA: Diagnosis not present

## 2018-01-24 DIAGNOSIS — R1030 Lower abdominal pain, unspecified: Secondary | ICD-10-CM | POA: Diagnosis not present

## 2018-01-25 ENCOUNTER — Other Ambulatory Visit: Payer: Self-pay

## 2018-01-25 ENCOUNTER — Encounter: Payer: Self-pay | Admitting: Emergency Medicine

## 2018-01-25 ENCOUNTER — Emergency Department
Admission: EM | Admit: 2018-01-25 | Discharge: 2018-01-25 | Disposition: A | Discharge status: Home / Self Care | Payer: Medicare Other | Attending: Emergency Medicine | Admitting: Emergency Medicine

## 2018-01-25 ENCOUNTER — Emergency Department: Payer: Medicare Other

## 2018-01-25 DIAGNOSIS — E039 Hypothyroidism, unspecified: Secondary | ICD-10-CM | POA: Insufficient documentation

## 2018-01-25 DIAGNOSIS — R110 Nausea: Secondary | ICD-10-CM | POA: Insufficient documentation

## 2018-01-25 DIAGNOSIS — N183 Chronic kidney disease, stage 3 (moderate): Secondary | ICD-10-CM | POA: Diagnosis not present

## 2018-01-25 DIAGNOSIS — Z79899 Other long term (current) drug therapy: Secondary | ICD-10-CM | POA: Diagnosis not present

## 2018-01-25 DIAGNOSIS — I4891 Unspecified atrial fibrillation: Secondary | ICD-10-CM | POA: Diagnosis not present

## 2018-01-25 DIAGNOSIS — Z7901 Long term (current) use of anticoagulants: Secondary | ICD-10-CM | POA: Insufficient documentation

## 2018-01-25 DIAGNOSIS — F0390 Unspecified dementia without behavioral disturbance: Secondary | ICD-10-CM | POA: Diagnosis not present

## 2018-01-25 DIAGNOSIS — E7800 Pure hypercholesterolemia, unspecified: Secondary | ICD-10-CM | POA: Diagnosis not present

## 2018-01-25 DIAGNOSIS — R1013 Epigastric pain: Secondary | ICD-10-CM | POA: Insufficient documentation

## 2018-01-25 DIAGNOSIS — I714 Abdominal aortic aneurysm, without rupture: Secondary | ICD-10-CM | POA: Diagnosis not present

## 2018-01-25 DIAGNOSIS — I129 Hypertensive chronic kidney disease with stage 1 through stage 4 chronic kidney disease, or unspecified chronic kidney disease: Secondary | ICD-10-CM | POA: Insufficient documentation

## 2018-01-25 LAB — COMPREHENSIVE METABOLIC PANEL
ALT: 12 U/L (ref 0–44)
AST: 22 U/L (ref 15–41)
Albumin: 4.2 g/dL (ref 3.5–5.0)
Alkaline Phosphatase: 66 U/L (ref 38–126)
Anion gap: 10 (ref 5–15)
BUN: 16 mg/dL (ref 8–23)
CO2: 27 mmol/L (ref 22–32)
Calcium: 9.3 mg/dL (ref 8.9–10.3)
Chloride: 102 mmol/L (ref 98–111)
Creatinine, Ser: 1.38 mg/dL — ABNORMAL HIGH (ref 0.44–1.00)
GFR calc Af Amer: 39 mL/min — ABNORMAL LOW (ref >60)
GFR calc non Af Amer: 34 mL/min — ABNORMAL LOW (ref >60)
Glucose, Bld: 122 mg/dL — ABNORMAL HIGH (ref 70–99)
Potassium: 3.4 mmol/L — ABNORMAL LOW (ref 3.5–5.1)
Sodium: 139 mmol/L (ref 135–145)
Total Bilirubin: 1 mg/dL (ref 0.3–1.2)
Total Protein: 7 g/dL (ref 6.5–8.1)

## 2018-01-25 LAB — CBC WITH DIFFERENTIAL/PLATELET
Abs Immature Granulocytes: 0.02 10*3/uL (ref 0.00–0.07)
Basophils Absolute: 0 10*3/uL (ref 0.0–0.1)
Basophils Relative: 1 %
Eosinophils Absolute: 0.1 10*3/uL (ref 0.0–0.5)
Eosinophils Relative: 2 %
HCT: 32.9 % — ABNORMAL LOW (ref 36.0–46.0)
Hemoglobin: 11 g/dL — ABNORMAL LOW (ref 12.0–15.0)
Immature Granulocytes: 0 %
Lymphocytes Relative: 22 %
Lymphs Abs: 1.2 10*3/uL (ref 0.7–4.0)
MCH: 31.2 pg (ref 26.0–34.0)
MCHC: 33.4 g/dL (ref 30.0–36.0)
MCV: 93.2 fL (ref 80.0–100.0)
Monocytes Absolute: 0.5 10*3/uL (ref 0.1–1.0)
Monocytes Relative: 9 %
Neutro Abs: 3.6 10*3/uL (ref 1.7–7.7)
Neutrophils Relative %: 66 %
Platelets: 153 10*3/uL (ref 150–400)
RBC: 3.53 MIL/uL — ABNORMAL LOW (ref 3.87–5.11)
RDW: 12.1 % (ref 11.5–15.5)
WBC: 5.4 10*3/uL (ref 4.0–10.5)
nRBC: 0 % (ref 0.0–0.2)

## 2018-01-25 LAB — TROPONIN I: Troponin I: <0.03 ng/mL (ref <0.03)

## 2018-01-25 LAB — LIPASE, BLOOD: Lipase: 23 U/L (ref 11–51)

## 2018-01-25 LAB — LACTIC ACID, PLASMA: Lactic Acid, Venous: 1 mmol/L (ref 0.5–1.9)

## 2018-01-25 MED ORDER — SODIUM CHLORIDE 0.9 % IV BOLUS
500.0000 mL | Freq: Once | INTRAVENOUS | Status: AC
  Administered 2018-01-25: 500 mL via INTRAVENOUS

## 2018-01-25 MED ORDER — MORPHINE SULFATE (PF) 2 MG/ML IV SOLN
2.0000 mg | Freq: Once | INTRAVENOUS | Status: AC
  Administered 2018-01-25: 2 mg via INTRAVENOUS
  Filled 2018-01-25: qty 1

## 2018-01-25 MED ORDER — ONDANSETRON HCL 4 MG/2ML IJ SOLN
4.0000 mg | Freq: Once | INTRAMUSCULAR | Status: AC
  Administered 2018-01-25: 4 mg via INTRAVENOUS
  Filled 2018-01-25: qty 2

## 2018-01-25 MED ORDER — IOHEXOL 350 MG/ML SOLN
80.0000 mL | Freq: Once | INTRAVENOUS | Status: AC | PRN
  Administered 2018-01-25: 80 mL via INTRAVENOUS

## 2018-01-25 NOTE — ED Triage Notes (Signed)
Here for lower abdominal pain only on palpation per pt. Pt is from home. + nausea, no vomiting. No fevers. VSS

## 2018-01-25 NOTE — Discharge Instructions (Signed)
Fortunately today your lab work, your EKG, and your CT scan were reassuring.  Please continue taking all of your medications as prescribed by your primary care physician and make an appointment to follow-up within the next 2 days for recheck.  Return to the emergency department sooner for any concerns particularly for fevers or chills or if you cannot eat or drink.  It was a pleasure to take care of you today, and thank you for coming to our emergency department.  If you have any questions or concerns before leaving please ask the nurse to grab me and I'm more than happy to go through your aftercare instructions again.  If you were prescribed any opioid pain medication today such as Norco, Vicodin, Percocet, morphine, hydrocodone, or oxycodone please make sure you do not drive when you are taking this medication as it can alter your ability to drive safely.  If you have any concerns once you are home that you are not improving or are in fact getting worse before you can make it to your follow-up appointment, please do not hesitate to call 911 and come back for further evaluation.  Darel Hong, MD  Results for orders placed or performed during the hospital encounter of 01/25/18  Comprehensive metabolic panel  Result Value Ref Range   Sodium 139 135 - 145 mmol/L   Potassium 3.4 (L) 3.5 - 5.1 mmol/L   Chloride 102 98 - 111 mmol/L   CO2 27 22 - 32 mmol/L   Glucose, Bld 122 (H) 70 - 99 mg/dL   BUN 16 8 - 23 mg/dL   Creatinine, Ser 1.38 (H) 0.44 - 1.00 mg/dL   Calcium 9.3 8.9 - 10.3 mg/dL   Total Protein 7.0 6.5 - 8.1 g/dL   Albumin 4.2 3.5 - 5.0 g/dL   AST 22 15 - 41 U/L   ALT 12 0 - 44 U/L   Alkaline Phosphatase 66 38 - 126 U/L   Total Bilirubin 1.0 0.3 - 1.2 mg/dL   GFR calc non Af Amer 34 (L) >60 mL/min   GFR calc Af Amer 39 (L) >60 mL/min   Anion gap 10 5 - 15  Troponin I  Result Value Ref Range   Troponin I <0.03 <0.03 ng/mL  Lactic acid, plasma  Result Value Ref Range   Lactic  Acid, Venous 1.0 0.5 - 1.9 mmol/L  CBC with Differential  Result Value Ref Range   WBC 5.4 4.0 - 10.5 K/uL   RBC 3.53 (L) 3.87 - 5.11 MIL/uL   Hemoglobin 11.0 (L) 12.0 - 15.0 g/dL   HCT 32.9 (L) 36.0 - 46.0 %   MCV 93.2 80.0 - 100.0 fL   MCH 31.2 26.0 - 34.0 pg   MCHC 33.4 30.0 - 36.0 g/dL   RDW 12.1 11.5 - 15.5 %   Platelets 153 150 - 400 K/uL   nRBC 0.0 0.0 - 0.2 %   Neutrophils Relative % 66 %   Neutro Abs 3.6 1.7 - 7.7 K/uL   Lymphocytes Relative 22 %   Lymphs Abs 1.2 0.7 - 4.0 K/uL   Monocytes Relative 9 %   Monocytes Absolute 0.5 0.1 - 1.0 K/uL   Eosinophils Relative 2 %   Eosinophils Absolute 0.1 0.0 - 0.5 K/uL   Basophils Relative 1 %   Basophils Absolute 0.0 0.0 - 0.1 K/uL   Immature Granulocytes 0 %   Abs Immature Granulocytes 0.02 0.00 - 0.07 K/uL  Lipase, blood  Result Value Ref Range  Lipase 23 11 - 51 U/L   Ct Angio Abd/pel W And/or Wo Contrast  Result Date: 01/25/2018 CLINICAL DATA:  Lower abdominal pain on palpation.  Nausea. EXAM: CTA ABDOMEN AND PELVIS wITHOUT AND WITH CONTRAST TECHNIQUE: Multidetector CT imaging of the abdomen and pelvis was performed using the standard protocol during bolus administration of intravenous contrast. Multiplanar reconstructed images and MIPs were obtained and reviewed to evaluate the vascular anatomy. CONTRAST:  24mL OMNIPAQUE IOHEXOL 350 MG/ML SOLN COMPARISON:  CT abdomen and pelvis 09/05/2016 FINDINGS: VASCULAR Aorta: Mild-to-moderate diffuse abdominal aortic calcification. Normal caliber without evidence of aneurysm, dissection, or significant stenosis. Celiac: Patent without evidence of aneurysm, dissection, vasculitis or significant stenosis. SMA: Patent without evidence of aneurysm, dissection, vasculitis or significant stenosis. Renals: Both renal arteries are patent without evidence of aneurysm, dissection, vasculitis, fibromuscular dysplasia or significant stenosis. IMA: Patent without evidence of aneurysm, dissection,  vasculitis or significant stenosis. Inflow: Patent without evidence of aneurysm, dissection, vasculitis or significant stenosis. Proximal Outflow: Bilateral common femoral and visualized portions of the superficial and profunda femoral arteries are patent without evidence of aneurysm, dissection, vasculitis or significant stenosis. Veins: No obvious venous abnormality within the limitations of this arterial phase study. Review of the MIP images confirms the above findings. NON-VASCULAR Lower chest: Motion artifact limits examination. Fibrosis or atelectasis in the lung bases. Diffuse cardiac enlargement. Hepatobiliary: No focal liver abnormality is seen. Status post cholecystectomy. No biliary dilatation. Pancreas: Unremarkable. No pancreatic ductal dilatation or surrounding inflammatory changes. Spleen: Normal in size without focal abnormality. Adrenals/Urinary Tract: Adrenal glands are unremarkable. Kidneys are normal, without renal calculi, focal lesion, or hydronephrosis. Bladder is unremarkable. Stomach/Bowel: Stomach is within normal limits. Appendix is not identified. No evidence of bowel wall thickening, distention, or inflammatory changes. Lymphatic: No significant lymphadenopathy. Reproductive: Status post hysterectomy. No adnexal masses. Other: No abdominal wall hernia or abnormality. No abdominopelvic ascites. Musculoskeletal: Degenerative changes in the spine. Postoperative changes with lumbar laminectomies and posterior fixation from L2 through L5. Intervertebral cage prosthesis at L4-5. No destructive bone lesions. IMPRESSION: VASCULAR CLINICAL DATA:  Lower abdominal pain on palpation. Nausea. EXAM: CTA ABDOMEN AND PELVIS wITHOUT AND WITH CONTRAST TECHNIQUE: Multidetector CT imaging of the abdomen and pelvis was performed using the standard protocol during bolus administration of intravenous contrast. Multiplanar reconstructed images and MIPs were obtained and reviewed to evaluate the vascular  anatomy. CONTRAST:  61mL OMNIPAQUE IOHEXOL 350 MG/ML SOLN COMPARISON:  CT abdomen and pelvis 09/05/2016 FINDINGS: VASCULAR Aorta: Mild-to-moderate diffuse abdominal aortic calcification. Normal caliber without evidence of aneurysm, dissection, or significant stenosis. Celiac: Patent without evidence of aneurysm, dissection, vasculitis or significant stenosis. SMA: Patent without evidence of aneurysm, dissection, vasculitis or significant stenosis. Renals: Both renal arteries are patent without evidence of aneurysm, dissection, vasculitis, fibromuscular dysplasia or significant stenosis. IMA: Patent without evidence of aneurysm, dissection, vasculitis or significant stenosis. Inflow: Patent without evidence of aneurysm, dissection, vasculitis or significant stenosis. Proximal Outflow: Bilateral common femoral and visualized portions of the superficial and profunda femoral arteries are patent without evidence of aneurysm, dissection, vasculitis or significant stenosis. Veins: No obvious venous abnormality within the limitations of this arterial phase study. Review of the MIP images confirms the above findings. NON-VASCULAR Lower chest: Motion artifact limits examination. Fibrosis or atelectasis in the lung bases. Diffuse cardiac enlargement. Hepatobiliary: No focal liver abnormality is seen. Status post cholecystectomy. No biliary dilatation. Pancreas: Unremarkable. No pancreatic ductal dilatation or surrounding inflammatory changes. Spleen: Normal in size without focal abnormality. Adrenals/Urinary Tract: Adrenal glands are  unremarkable. Kidneys are normal, without renal calculi, focal lesion, or hydronephrosis. Bladder is unremarkable. Stomach/Bowel: Stomach is within normal limits. Appendix is not identified. No evidence of bowel wall thickening, distention, or inflammatory changes. Lymphatic: No significant lymphadenopathy. Reproductive: Status post hysterectomy. No adnexal masses. Other: No abdominal wall hernia  or abnormality. No abdominopelvic ascites. Musculoskeletal: Degenerative changes in the spine. Postoperative changes with lumbar laminectomies and posterior fixation from L2 through L5. Intervertebral cage prosthesis at L4-5. No destructive bone lesions. IMPRESSION: VASCULAR 1. No evidence of aortic aneurysm, dissection, or significant stenosis. 2. Mild-to-moderate diffuse atherosclerotic calcification of the abdominal aorta and its branch vessels. 3. Mesenteric vessels appear patent. NON-VASCULAR 1. No acute findings in the abdomen or pelvis. 2. Cardiac enlargement. NON-VASCULAR NON-VASCULAR Electronically Signed   By: Lucienne Capers M.D.   On: 01/25/2018 03:24

## 2018-01-25 NOTE — ED Provider Notes (Signed)
[facility] Emergency Department Provider Note  ____________________________________________   First MD Initiated Contact with Patient 01/25/18 0033     (approximate)  I have reviewed the triage vital signs and the nursing notes.   HISTORY  Chief Complaint Abdominal Pain  Level 5 exemption history limited by the patient's dementia  HPI Alexandra Martinez is a 84 y.o. female who comes to the emergency department via EMS with epigastric discomfort and nausea that is difficult for the patient to quantify.  She says that the pain and nausea have been going on "for quite a long time" although she is unable to tell me if it has been a week a month or a year.  According to EMS she has a history of atrial fibrillation, hypertension, as well as gastritis.  She is supposed to be taking Xarelto for anticoagulation however has been intermittently compliant with all of her medications secondary to progressive dementia.  EMS notes that the patient has been taking Zofran frequently for this nausea.    Past Medical History:  Diagnosis Date  . Anemia    r/t uterine fibroids  . Atrial fib/flutter, transient    [facility] admission for RVR 03/2009  . DDD (degenerative disc disease)   . Dysrhythmia    A-FIB  . GERD (gastroesophageal reflux disease)   . H/O bladder infections   . H/O hiatal hernia   . H/O thyroid nodule    surgically removed  . Heart murmur    moderate MR/TR 08/2013 echo [name])  . Hemorrhoids   . Hiatal hernia with gastroesophageal reflux 2008   by EGD,  [name]  . High cholesterol   . History of uterine fibroid   . Hydronephrosis    uretero- with congenital UPJ obstruction- Dr. Edrick Oh  . Hydronephrosis with ureteropelvic junction obstruction   . Hypertension    [facility]  . Hypothyroidism   . Nocturia   . Osteoporosis   . Polyuria   . Recurrent UTI     Patient Active Problem List   Diagnosis Date Noted  . Moderate dementia without  behavioral disturbance ([place]) 10/29/2017  . CKD (chronic kidney disease) stage 3, GFR 30-59 ml/min ([facility]) 09/18/2017  . Cognitive decline 06/14/2017  . Essential hypertension 06/14/2017  . B12 deficiency 09/18/2016  . Anemia 08/05/2015  . Vitamin D deficiency 08/05/2015  . Constipation 02/04/2015  . Left knee pain 01/05/2015  . Status post laminectomy with spinal fusion 02/23/2014  . Fatigue 07/07/2013  . Chronic lower back pain 08/12/2012  . Urge urinary incontinence 08/12/2012  . Personal history of colonic polyps 08/12/2012  . Seasonal rhinitis 01/16/2012  . Hiatal hernia with gastroesophageal reflux   . Fatigue 09/16/2011  . Controlled atrial fibrillation ([place]) 12/15/2010  . Irritable bowel syndrome without diarrhea 12/15/2010  . Hypothyroid 12/15/2010  . Long term current use of anticoagulant therapy 12/15/2010    Past Surgical History:  Procedure Laterality Date  . ADENOIDECTOMY N/A 01/05/2016   Procedure: ADENOIDECTOMY;  Surgeon: Margaretha Sheffield, MD;  Location: [facility] ORS;  Service: ENT;  Laterality: N/A;  . BACK SURGERY    . CATARACT EXTRACTION     left  . CERVICAL DISC SURGERY  1980's  . CHOLECYSTECTOMY    . NASOPHARYNGOSCOPY  01/05/2016   Procedure: NASOPHARYNGOSCOPY;  Surgeon: Margaretha Sheffield, MD;  Location: [facility] ORS;  Service: ENT;;  . THYROID LOBECTOMY     partial, right lobe  . TOTAL ABDOMINAL HYSTERECTOMY W/ BILATERAL SALPINGOOPHORECTOMY      Prior  to Admission medications   Medication Sig Start Date End Date Taking? Authorizing Provider  acetaminophen (TYLENOL) 500 MG tablet Take 500 mg by mouth every 6 (six) hours as needed for headache.    [provider]  esomeprazole (NEXIUM) 40 MG capsule Take 1 capsule (40 mg total) by mouth daily at 12 noon. 12/20/17   Crecencio Mc, MD  furosemide (LASIX) 20 MG tablet Take by mouth. 06/16/16   [provider]  levothyroxine (SYNTHROID, LEVOTHROID) 50 MCG tablet TAKE 1 TABLET BY MOUTH EVERY DAY 07/22/17   Crecencio Mc, MD  losartan (COZAAR) 100 MG tablet TAKE 1 TABLET(100 MG) BY MOUTH DAILY 11/14/17   Crecencio Mc, MD  metoprolol tartrate (LOPRESSOR) 100 MG tablet TAKE 1 TABLET BY MOUTH TWICE DAILY 12/12/17   Crecencio Mc, MD  ondansetron (ZOFRAN) 4 MG tablet TAKE 1 TABLET BY MOUTH EVERY 8 HOURS AS NEEDED FOR NAUSEA OR VOMITING 06/20/17   Crecencio Mc, MD  polyethylene glycol powder (GLYCOLAX/MIRALAX) powder Take 17 g by mouth daily as needed for mild constipation. 01/02/15   Leone Haven, MD  pravastatin (PRAVACHOL) 20 MG tablet TAKE 1 TABLET(20 MG) BY MOUTH EVERY NIGHT 10/18/17   Crecencio Mc, MD  Rivaroxaban (XARELTO) 15 MG TABS tablet Take 15 mg by mouth daily with supper.    [provider]  Simethicone (EQ GAS RELIEF) 125 MG CAPS Take 125 mg by mouth daily as needed (gas). Reported on 07/01/2015    [provider]  traMADol (ULTRAM) 50 MG tablet Take 1 tablet (50 mg total) by mouth every 8 (eight) hours as needed. 08/16/17   Crecencio Mc, MD    Allergies Celebrex [celecoxib]; Donepezil; Loratadine-pseudoephedrine er; Venlafaxine; Vioxx [rofecoxib]; Codeine; Dexilant [dexlansoprazole]; and Eliquis [apixaban]  Family History  Problem Relation Age of Onset  . Heart disease Mother   . Coronary artery disease Father        MI's    Social History Social History   Tobacco Use  . Smoking status: Never Smoker  . Smokeless tobacco: Never Used  Substance Use Topics  . Alcohol use: No  . Drug use: No    Review of Systems Level 5 exemption history limited by the patient's dementia  ____________________________________________   PHYSICAL EXAM:  VITAL SIGNS: ED Triage Vitals [01/25/18 0033]  Enc Vitals Group     BP      Pulse Rate 88     Resp 18     Temp 98 F (36.7 C)     Temp Source Oral     SpO2 98 %     Weight      Height      Head Circumference      Peak Flow      Pain Score 0     Pain Loc      Pain Edu?      Excl. in [place]?      Constitutional: Pleasantly confused grimacing and holding her abdomen Eyes: PERRL EOMI. Head: Atraumatic. Nose: No congestion/rhinnorhea. Mouth/Throat: No trismus Neck: No stridor.   Cardiovascular: Normal rate, regular rhythm. Grossly normal heart sounds.  Good peripheral circulation. Respiratory: Normal respiratory effort.  No retractions. Lungs CTAB and moving good air Gastrointestinal: Soft diffuse mild tenderness with no focality no rebound no guarding no peritonitis Musculoskeletal: No lower extremity edema   Neurologic:  No gross focal neurologic deficits are appreciated. Skin:  Skin is warm, dry and intact. No rash noted. Psychiatric: Pleasantly confused  ____________________________________________   DIFFERENTIAL includes but not limited to  Mesenteric ischemia, small bowel obstruction, large bowel obstruction, volvulus ____________________________________________   LABS (all labs ordered are listed, but only abnormal results are displayed)  Labs Reviewed  COMPREHENSIVE METABOLIC PANEL - Abnormal; Notable for the following components:      Result Value   Potassium 3.4 (*)    Glucose, Bld 122 (*)    Creatinine, Ser 1.38 (*)    GFR calc non Af Amer 34 (*)    GFR calc Af Amer 39 (*)    All other components within normal limits  CBC WITH DIFFERENTIAL/PLATELET - Abnormal; Notable for the following components:   RBC 3.53 (*)    Hemoglobin 11.0 (*)    HCT 32.9 (*)    All other components within normal limits  TROPONIN I  LACTIC ACID, PLASMA  LIPASE, BLOOD    Lab work reviewed by me with clinically insignificant hypokalemia otherwise unremarkable __________________________________________  EKG  ED ECG REPORT I, Darel Hong, the attending physician, personally viewed and interpreted this ECG.  Date: 01/25/2018 EKG Time: 0038 Rate: 85 Rhythm: Atrial fibrillation QRS Axis: normal Intervals: normal ST/T Wave abnormalities: Nonspecific T wave  flattening anteriorly Narrative Interpretation: no evidence of acute ischemia  ____________________________________________  RADIOLOGY  CT angiogram of the abdomen pelvis reviewed by me shows significant peripheral artery disease but no acute disease noted ____________________________________________   PROCEDURES  Procedure(s) performed: no  Procedures  Critical Care performed: no  ____________________________________________   INITIAL IMPRESSION / ASSESSMENT AND PLAN / ED COURSE  Pertinent labs & imaging results that were available during my care of the patient were reviewed by me and considered in my medical decision making (see chart for details).   As part of my medical decision making, I reviewed the following data within the [employer] History obtained from family if available, nursing notes, old chart and ekg, as well as notes from prior ED visits.  The patient comes to the emergency department with nausea and abdominal pain that is difficult for her to quantify.  Given her advanced age, atrial fibrillation, and intermittent compliance with her anticoagulation the most concern for mesenteric ischemia.  Given 2 mg of IV morphine and 4 mg of IV Zofran with some improvement of her symptoms.  We will also give her 500 cc of normal saline wide lab work including a lactic acid are pending.  She will require CT angiogram of her abdomen pelvis.  The patient's lab work is reassuring and her lactic acid is negative.  CT scan shows significant peripheral artery disease although no acute disease noted.  The patient feels improved and at this point she is medically stable for outpatient management.      ____________________________________________   FINAL CLINICAL IMPRESSION(S) / ED DIAGNOSES  Final diagnoses:  Epigastric pain  Nausea      NEW MEDICATIONS STARTED DURING THIS VISIT:  Discharge Medication List as of 01/25/2018  3:31 AM       Note:  This  document was prepared using Dragon voice recognition software and may include unintentional dictation errors.     Darel Hong, MD 01/27/18 [phone]

## 2018-01-28 ENCOUNTER — Other Ambulatory Visit: Payer: Self-pay | Admitting: Internal Medicine

## 2018-02-01 ENCOUNTER — Ambulatory Visit (INDEPENDENT_AMBULATORY_CARE_PROVIDER_SITE_OTHER): Payer: Medicare Other | Admitting: Internal Medicine

## 2018-02-01 ENCOUNTER — Encounter: Payer: Self-pay | Admitting: Internal Medicine

## 2018-02-01 DIAGNOSIS — F411 Generalized anxiety disorder: Secondary | ICD-10-CM | POA: Diagnosis not present

## 2018-02-01 DIAGNOSIS — F03B0 Unspecified dementia, moderate, without behavioral disturbance, psychotic disturbance, mood disturbance, and anxiety: Secondary | ICD-10-CM

## 2018-02-01 DIAGNOSIS — R634 Abnormal weight loss: Secondary | ICD-10-CM

## 2018-02-01 DIAGNOSIS — F0390 Unspecified dementia without behavioral disturbance: Secondary | ICD-10-CM | POA: Diagnosis not present

## 2018-02-01 DIAGNOSIS — Z23 Encounter for immunization: Secondary | ICD-10-CM

## 2018-02-01 DIAGNOSIS — I872 Venous insufficiency (chronic) (peripheral): Secondary | ICD-10-CM | POA: Diagnosis not present

## 2018-02-01 DIAGNOSIS — N183 Chronic kidney disease, stage 3 unspecified: Secondary | ICD-10-CM

## 2018-02-01 DIAGNOSIS — K589 Irritable bowel syndrome without diarrhea: Secondary | ICD-10-CM

## 2018-02-01 MED ORDER — ESOMEPRAZOLE MAGNESIUM 40 MG PO CPDR: 40.0000 mg | DELAYED_RELEASE_CAPSULE | Freq: Two times a day (BID) | ORAL | 0 refills | Status: DC

## 2018-02-01 MED ORDER — ESCITALOPRAM OXALATE 5 MG PO TABS: 5.0000 mg | ORAL_TABLET | Freq: Every day | ORAL | 5 refills | Status: DC

## 2018-02-01 NOTE — Patient Instructions (Addendum)
I recommend looking at 2  assisted living facility for patients with dementia:  Alexandra Martinez ,  On [address]   I recommend increasing the nexium to twice daily.   I am adding lexapro for anxiety to take at night    Your leg swelling is due to sitting too much  And gravity causing the blood to pool In your legs
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
## 2018-02-04 DIAGNOSIS — I872 Venous insufficiency (chronic) (peripheral): Secondary | ICD-10-CM | POA: Insufficient documentation

## 2018-02-04 DIAGNOSIS — F411 Generalized anxiety disorder: Secondary | ICD-10-CM | POA: Insufficient documentation

## 2018-02-04 NOTE — Assessment & Plan Note (Signed)
No significant change since Jan 2019 per nephrology evaluation   Lab Results  Component Value Date   CREATININE 1.38 (H) 01/25/2018

## 2018-02-04 NOTE — Assessment & Plan Note (Signed)
Increase nexium to bid as a trial and adding lexapro for anxiety

## 2018-02-04 NOTE — Assessment & Plan Note (Signed)
She has lost 30 lbs in one year due to dementia

## 2018-02-04 NOTE — Assessment & Plan Note (Signed)
Adding lexapro at night.

## 2018-02-04 NOTE — Assessment & Plan Note (Signed)
Leg elevation and compression stockings advised which will  Be problematic given her dementia

## 2018-02-04 NOTE — Assessment & Plan Note (Addendum)
She has been advised by Dr Jannet Mantis to start Aricepts, which she refuses to take,.  MMSe is 15/30. Patient is losing weight and not taking her medications due to dementia.  Her niece has POA  And has been advised to transition patient to assisted living .  Patient is unwilling to make the transition and prefers to remain in her  home she understands that the consequences of living alone include death,which she states she is ready to face. Will discuss DNR orders at next visit.

## 2018-02-06 ENCOUNTER — Telehealth: Payer: Self-pay

## 2018-02-06 NOTE — Telephone Encounter (Signed)
Copied from [place] [phone]. Topic: General - Inquiry >> Feb 06, 2018 10:37 AM Sheran Luz wrote: Reason for CRM: [name], pts niece calling to inquire about Fl2 form- specifically how to get one and what is the process involved.

## 2018-02-07 ENCOUNTER — Ambulatory Visit: Payer: Medicare Other

## 2018-02-07 NOTE — Telephone Encounter (Signed)
Spoke with [name], pt's niece and she stated that the [facility] form is not needed. She stated that the pt will be going to [facility] on Monday and she stated that thye do not need an FL2 form. [name] also wanted to let you know that they stopped the pt's pravastatin.

## 2018-02-10 ENCOUNTER — Telehealth: Payer: Self-pay | Admitting: *Deleted

## 2018-02-10 DIAGNOSIS — M545 Low back pain, unspecified: Secondary | ICD-10-CM

## 2018-02-10 DIAGNOSIS — G8929 Other chronic pain: Secondary | ICD-10-CM

## 2018-02-10 NOTE — Telephone Encounter (Signed)
Copied from [place] [phone]. Topic: Quick Communication - See Telephone Encounter >> Feb 10, 2018 11:59 AM Gardiner Ramus wrote: CRM for notification. See Telephone encounter for: 02/10/18. Pt niece called and stated that she would like an order for a walker. She would like to pick this up 02/13/18. Please advise

## 2018-02-10 NOTE — Telephone Encounter (Signed)
Yes thank you1

## 2018-02-10 NOTE — Telephone Encounter (Signed)
Copied from [place] [phone]. Topic: Quick Communication - See Telephone Encounter >> Feb 10, 2018 11:15 AM Antonieta Iba C wrote: CRM for notification. See Telephone encounter for: 02/10/18.   [facility] is calling in to follow up on fax requesting vo for PT, OT and Speech therapy. They are looking to go out to pt on Monday. The frequency will be determined based on eval on Monday.   CB: [phone]  Fax: [phone]

## 2018-02-10 NOTE — Telephone Encounter (Signed)
Verbal order given,

## 2018-02-10 NOTE — Telephone Encounter (Signed)
I have written DME for walker and placed in quick sign if PCP agree's?

## 2018-02-12 ENCOUNTER — Other Ambulatory Visit: Payer: Self-pay | Admitting: Internal Medicine

## 2018-02-13 ENCOUNTER — Telehealth: Payer: Self-pay | Admitting: Internal Medicine

## 2018-02-13 DIAGNOSIS — R2689 Other abnormalities of gait and mobility: Secondary | ICD-10-CM | POA: Diagnosis not present

## 2018-02-13 DIAGNOSIS — M545 Low back pain: Secondary | ICD-10-CM | POA: Diagnosis not present

## 2018-02-13 DIAGNOSIS — M6281 Muscle weakness (generalized): Secondary | ICD-10-CM | POA: Diagnosis not present

## 2018-02-13 DIAGNOSIS — R296 Repeated falls: Secondary | ICD-10-CM | POA: Diagnosis not present

## 2018-02-13 DIAGNOSIS — R278 Other lack of coordination: Secondary | ICD-10-CM | POA: Diagnosis not present

## 2018-02-13 DIAGNOSIS — R488 Other symbolic dysfunctions: Secondary | ICD-10-CM | POA: Diagnosis not present

## 2018-02-13 NOTE — Telephone Encounter (Signed)
Copied from [place] [id number]. Topic: General - Other >> Feb 13, 2018 12:40 PM Valla Leaver wrote: Reason for CRM: Anderson Malta PT with [employer], needs office notes from last visit with medical history and medication list. FAX:[phone] Completed PT eval today.

## 2018-02-13 NOTE — Telephone Encounter (Deleted)
Copied from [place] [phone]. Topic: General - Other >> Feb 13, 2018 12:40 PM Valla Leaver wrote: Reason for CRM: Anderson Malta PT with [employer], needs office notes from last visit with medical history and medication list. FAX:[phone] Completed PT eval today.

## 2018-02-13 NOTE — Telephone Encounter (Signed)
Faxed paperwork to [facility] electronically.

## 2018-02-14 DIAGNOSIS — M6281 Muscle weakness (generalized): Secondary | ICD-10-CM | POA: Diagnosis not present

## 2018-02-14 DIAGNOSIS — R488 Other symbolic dysfunctions: Secondary | ICD-10-CM | POA: Diagnosis not present

## 2018-02-14 DIAGNOSIS — R2689 Other abnormalities of gait and mobility: Secondary | ICD-10-CM | POA: Diagnosis not present

## 2018-02-14 DIAGNOSIS — M545 Low back pain: Secondary | ICD-10-CM | POA: Diagnosis not present

## 2018-02-14 DIAGNOSIS — R278 Other lack of coordination: Secondary | ICD-10-CM | POA: Diagnosis not present

## 2018-02-14 DIAGNOSIS — R296 Repeated falls: Secondary | ICD-10-CM | POA: Diagnosis not present

## 2018-02-15 NOTE — Telephone Encounter (Signed)
Called patients niece and advised that script for rolling walker was ready for pick up and she states she doesn't need . She has already gotten rolling walker , she states Medicare doesn't cover.

## 2018-02-16 DIAGNOSIS — R488 Other symbolic dysfunctions: Secondary | ICD-10-CM | POA: Diagnosis not present

## 2018-02-16 DIAGNOSIS — M6281 Muscle weakness (generalized): Secondary | ICD-10-CM | POA: Diagnosis not present

## 2018-02-16 DIAGNOSIS — M545 Low back pain: Secondary | ICD-10-CM | POA: Diagnosis not present

## 2018-02-16 DIAGNOSIS — R296 Repeated falls: Secondary | ICD-10-CM | POA: Diagnosis not present

## 2018-02-16 DIAGNOSIS — R2689 Other abnormalities of gait and mobility: Secondary | ICD-10-CM | POA: Diagnosis not present

## 2018-02-16 DIAGNOSIS — R278 Other lack of coordination: Secondary | ICD-10-CM | POA: Diagnosis not present

## 2018-02-17 DIAGNOSIS — R2689 Other abnormalities of gait and mobility: Secondary | ICD-10-CM | POA: Diagnosis not present

## 2018-02-17 DIAGNOSIS — M545 Low back pain: Secondary | ICD-10-CM | POA: Diagnosis not present

## 2018-02-17 DIAGNOSIS — R41841 Cognitive communication deficit: Secondary | ICD-10-CM | POA: Diagnosis not present

## 2018-02-17 DIAGNOSIS — R278 Other lack of coordination: Secondary | ICD-10-CM | POA: Diagnosis not present

## 2018-02-17 DIAGNOSIS — R296 Repeated falls: Secondary | ICD-10-CM | POA: Diagnosis not present

## 2018-02-17 DIAGNOSIS — M6281 Muscle weakness (generalized): Secondary | ICD-10-CM | POA: Diagnosis not present

## 2018-02-17 DIAGNOSIS — R488 Other symbolic dysfunctions: Secondary | ICD-10-CM | POA: Diagnosis not present

## 2018-02-20 DIAGNOSIS — R41841 Cognitive communication deficit: Secondary | ICD-10-CM | POA: Diagnosis not present

## 2018-02-20 DIAGNOSIS — M6281 Muscle weakness (generalized): Secondary | ICD-10-CM | POA: Diagnosis not present

## 2018-02-20 DIAGNOSIS — R278 Other lack of coordination: Secondary | ICD-10-CM | POA: Diagnosis not present

## 2018-02-20 DIAGNOSIS — R2689 Other abnormalities of gait and mobility: Secondary | ICD-10-CM | POA: Diagnosis not present

## 2018-02-20 DIAGNOSIS — R296 Repeated falls: Secondary | ICD-10-CM | POA: Diagnosis not present

## 2018-02-20 DIAGNOSIS — M545 Low back pain: Secondary | ICD-10-CM | POA: Diagnosis not present

## 2018-02-21 DIAGNOSIS — M6281 Muscle weakness (generalized): Secondary | ICD-10-CM | POA: Diagnosis not present

## 2018-02-21 DIAGNOSIS — R278 Other lack of coordination: Secondary | ICD-10-CM | POA: Diagnosis not present

## 2018-02-21 DIAGNOSIS — M545 Low back pain: Secondary | ICD-10-CM | POA: Diagnosis not present

## 2018-02-21 DIAGNOSIS — R41841 Cognitive communication deficit: Secondary | ICD-10-CM | POA: Diagnosis not present

## 2018-02-21 DIAGNOSIS — R296 Repeated falls: Secondary | ICD-10-CM | POA: Diagnosis not present

## 2018-02-21 DIAGNOSIS — R2689 Other abnormalities of gait and mobility: Secondary | ICD-10-CM | POA: Diagnosis not present

## 2018-02-23 DIAGNOSIS — R41841 Cognitive communication deficit: Secondary | ICD-10-CM | POA: Diagnosis not present

## 2018-02-23 DIAGNOSIS — R2689 Other abnormalities of gait and mobility: Secondary | ICD-10-CM | POA: Diagnosis not present

## 2018-02-23 DIAGNOSIS — R296 Repeated falls: Secondary | ICD-10-CM | POA: Diagnosis not present

## 2018-02-23 DIAGNOSIS — R278 Other lack of coordination: Secondary | ICD-10-CM | POA: Diagnosis not present

## 2018-02-23 DIAGNOSIS — M6281 Muscle weakness (generalized): Secondary | ICD-10-CM | POA: Diagnosis not present

## 2018-02-23 DIAGNOSIS — M545 Low back pain: Secondary | ICD-10-CM | POA: Diagnosis not present

## 2018-02-27 ENCOUNTER — Ambulatory Visit: Payer: Medicare Other | Admitting: Internal Medicine

## 2018-02-27 DIAGNOSIS — M6281 Muscle weakness (generalized): Secondary | ICD-10-CM | POA: Diagnosis not present

## 2018-02-27 DIAGNOSIS — R278 Other lack of coordination: Secondary | ICD-10-CM | POA: Diagnosis not present

## 2018-02-27 DIAGNOSIS — M545 Low back pain: Secondary | ICD-10-CM | POA: Diagnosis not present

## 2018-02-27 DIAGNOSIS — R41841 Cognitive communication deficit: Secondary | ICD-10-CM | POA: Diagnosis not present

## 2018-02-27 DIAGNOSIS — R2689 Other abnormalities of gait and mobility: Secondary | ICD-10-CM | POA: Diagnosis not present

## 2018-02-27 DIAGNOSIS — R296 Repeated falls: Secondary | ICD-10-CM | POA: Diagnosis not present

## 2018-02-28 DIAGNOSIS — R2689 Other abnormalities of gait and mobility: Secondary | ICD-10-CM | POA: Diagnosis not present

## 2018-02-28 DIAGNOSIS — M6281 Muscle weakness (generalized): Secondary | ICD-10-CM | POA: Diagnosis not present

## 2018-02-28 DIAGNOSIS — M545 Low back pain: Secondary | ICD-10-CM | POA: Diagnosis not present

## 2018-02-28 DIAGNOSIS — R41841 Cognitive communication deficit: Secondary | ICD-10-CM | POA: Diagnosis not present

## 2018-02-28 DIAGNOSIS — R278 Other lack of coordination: Secondary | ICD-10-CM | POA: Diagnosis not present

## 2018-02-28 DIAGNOSIS — R296 Repeated falls: Secondary | ICD-10-CM | POA: Diagnosis not present

## 2018-03-01 DIAGNOSIS — R278 Other lack of coordination: Secondary | ICD-10-CM | POA: Diagnosis not present

## 2018-03-01 DIAGNOSIS — R2689 Other abnormalities of gait and mobility: Secondary | ICD-10-CM | POA: Diagnosis not present

## 2018-03-01 DIAGNOSIS — M6281 Muscle weakness (generalized): Secondary | ICD-10-CM | POA: Diagnosis not present

## 2018-03-01 DIAGNOSIS — R296 Repeated falls: Secondary | ICD-10-CM | POA: Diagnosis not present

## 2018-03-01 DIAGNOSIS — R41841 Cognitive communication deficit: Secondary | ICD-10-CM | POA: Diagnosis not present

## 2018-03-01 DIAGNOSIS — M545 Low back pain: Secondary | ICD-10-CM | POA: Diagnosis not present

## 2018-03-02 DIAGNOSIS — M545 Low back pain: Secondary | ICD-10-CM | POA: Diagnosis not present

## 2018-03-02 DIAGNOSIS — R41841 Cognitive communication deficit: Secondary | ICD-10-CM | POA: Diagnosis not present

## 2018-03-02 DIAGNOSIS — R278 Other lack of coordination: Secondary | ICD-10-CM | POA: Diagnosis not present

## 2018-03-02 DIAGNOSIS — M6281 Muscle weakness (generalized): Secondary | ICD-10-CM | POA: Diagnosis not present

## 2018-03-02 DIAGNOSIS — R2689 Other abnormalities of gait and mobility: Secondary | ICD-10-CM | POA: Diagnosis not present

## 2018-03-02 DIAGNOSIS — R296 Repeated falls: Secondary | ICD-10-CM | POA: Diagnosis not present

## 2018-03-03 DIAGNOSIS — R41841 Cognitive communication deficit: Secondary | ICD-10-CM | POA: Diagnosis not present

## 2018-03-03 DIAGNOSIS — R278 Other lack of coordination: Secondary | ICD-10-CM | POA: Diagnosis not present

## 2018-03-03 DIAGNOSIS — M6281 Muscle weakness (generalized): Secondary | ICD-10-CM | POA: Diagnosis not present

## 2018-03-03 DIAGNOSIS — R2689 Other abnormalities of gait and mobility: Secondary | ICD-10-CM | POA: Diagnosis not present

## 2018-03-03 DIAGNOSIS — R296 Repeated falls: Secondary | ICD-10-CM | POA: Diagnosis not present

## 2018-03-03 DIAGNOSIS — M545 Low back pain: Secondary | ICD-10-CM | POA: Diagnosis not present

## 2018-03-06 DIAGNOSIS — R41841 Cognitive communication deficit: Secondary | ICD-10-CM | POA: Diagnosis not present

## 2018-03-06 DIAGNOSIS — M545 Low back pain: Secondary | ICD-10-CM | POA: Diagnosis not present

## 2018-03-06 DIAGNOSIS — R296 Repeated falls: Secondary | ICD-10-CM | POA: Diagnosis not present

## 2018-03-06 DIAGNOSIS — M6281 Muscle weakness (generalized): Secondary | ICD-10-CM | POA: Diagnosis not present

## 2018-03-06 DIAGNOSIS — R2689 Other abnormalities of gait and mobility: Secondary | ICD-10-CM | POA: Diagnosis not present

## 2018-03-06 DIAGNOSIS — R278 Other lack of coordination: Secondary | ICD-10-CM | POA: Diagnosis not present

## 2018-03-07 ENCOUNTER — Ambulatory Visit: Payer: Medicare Other | Admitting: Internal Medicine

## 2018-03-07 DIAGNOSIS — R2689 Other abnormalities of gait and mobility: Secondary | ICD-10-CM | POA: Diagnosis not present

## 2018-03-07 DIAGNOSIS — R296 Repeated falls: Secondary | ICD-10-CM | POA: Diagnosis not present

## 2018-03-07 DIAGNOSIS — M545 Low back pain: Secondary | ICD-10-CM | POA: Diagnosis not present

## 2018-03-07 DIAGNOSIS — R41841 Cognitive communication deficit: Secondary | ICD-10-CM | POA: Diagnosis not present

## 2018-03-07 DIAGNOSIS — M6281 Muscle weakness (generalized): Secondary | ICD-10-CM | POA: Diagnosis not present

## 2018-03-07 DIAGNOSIS — R278 Other lack of coordination: Secondary | ICD-10-CM | POA: Diagnosis not present

## 2018-03-08 DIAGNOSIS — R296 Repeated falls: Secondary | ICD-10-CM | POA: Diagnosis not present

## 2018-03-08 DIAGNOSIS — M6281 Muscle weakness (generalized): Secondary | ICD-10-CM | POA: Diagnosis not present

## 2018-03-08 DIAGNOSIS — R41841 Cognitive communication deficit: Secondary | ICD-10-CM | POA: Diagnosis not present

## 2018-03-08 DIAGNOSIS — R2689 Other abnormalities of gait and mobility: Secondary | ICD-10-CM | POA: Diagnosis not present

## 2018-03-08 DIAGNOSIS — R278 Other lack of coordination: Secondary | ICD-10-CM | POA: Diagnosis not present

## 2018-03-08 DIAGNOSIS — M545 Low back pain: Secondary | ICD-10-CM | POA: Diagnosis not present

## 2018-03-09 DIAGNOSIS — R41841 Cognitive communication deficit: Secondary | ICD-10-CM | POA: Diagnosis not present

## 2018-03-09 DIAGNOSIS — M6281 Muscle weakness (generalized): Secondary | ICD-10-CM | POA: Diagnosis not present

## 2018-03-09 DIAGNOSIS — R296 Repeated falls: Secondary | ICD-10-CM | POA: Diagnosis not present

## 2018-03-09 DIAGNOSIS — R278 Other lack of coordination: Secondary | ICD-10-CM | POA: Diagnosis not present

## 2018-03-09 DIAGNOSIS — M545 Low back pain: Secondary | ICD-10-CM | POA: Diagnosis not present

## 2018-03-09 DIAGNOSIS — R2689 Other abnormalities of gait and mobility: Secondary | ICD-10-CM | POA: Diagnosis not present

## 2018-03-10 DIAGNOSIS — R41841 Cognitive communication deficit: Secondary | ICD-10-CM | POA: Diagnosis not present

## 2018-03-10 DIAGNOSIS — M6281 Muscle weakness (generalized): Secondary | ICD-10-CM | POA: Diagnosis not present

## 2018-03-10 DIAGNOSIS — R296 Repeated falls: Secondary | ICD-10-CM | POA: Diagnosis not present

## 2018-03-10 DIAGNOSIS — M545 Low back pain: Secondary | ICD-10-CM | POA: Diagnosis not present

## 2018-03-10 DIAGNOSIS — R2689 Other abnormalities of gait and mobility: Secondary | ICD-10-CM | POA: Diagnosis not present

## 2018-03-10 DIAGNOSIS — R278 Other lack of coordination: Secondary | ICD-10-CM | POA: Diagnosis not present

## 2018-03-14 DIAGNOSIS — R278 Other lack of coordination: Secondary | ICD-10-CM | POA: Diagnosis not present

## 2018-03-14 DIAGNOSIS — M6281 Muscle weakness (generalized): Secondary | ICD-10-CM | POA: Diagnosis not present

## 2018-03-14 DIAGNOSIS — M545 Low back pain: Secondary | ICD-10-CM | POA: Diagnosis not present

## 2018-03-14 DIAGNOSIS — R296 Repeated falls: Secondary | ICD-10-CM | POA: Diagnosis not present

## 2018-03-14 DIAGNOSIS — R41841 Cognitive communication deficit: Secondary | ICD-10-CM | POA: Diagnosis not present

## 2018-03-14 DIAGNOSIS — R2689 Other abnormalities of gait and mobility: Secondary | ICD-10-CM | POA: Diagnosis not present

## 2018-03-15 DIAGNOSIS — R296 Repeated falls: Secondary | ICD-10-CM | POA: Diagnosis not present

## 2018-03-15 DIAGNOSIS — R41841 Cognitive communication deficit: Secondary | ICD-10-CM | POA: Diagnosis not present

## 2018-03-15 DIAGNOSIS — M6281 Muscle weakness (generalized): Secondary | ICD-10-CM | POA: Diagnosis not present

## 2018-03-15 DIAGNOSIS — M545 Low back pain: Secondary | ICD-10-CM | POA: Diagnosis not present

## 2018-03-15 DIAGNOSIS — R278 Other lack of coordination: Secondary | ICD-10-CM | POA: Diagnosis not present

## 2018-03-15 DIAGNOSIS — R2689 Other abnormalities of gait and mobility: Secondary | ICD-10-CM | POA: Diagnosis not present

## 2018-03-17 DIAGNOSIS — M545 Low back pain: Secondary | ICD-10-CM | POA: Diagnosis not present

## 2018-03-17 DIAGNOSIS — R296 Repeated falls: Secondary | ICD-10-CM | POA: Diagnosis not present

## 2018-03-17 DIAGNOSIS — R41841 Cognitive communication deficit: Secondary | ICD-10-CM | POA: Diagnosis not present

## 2018-03-17 DIAGNOSIS — R2689 Other abnormalities of gait and mobility: Secondary | ICD-10-CM | POA: Diagnosis not present

## 2018-03-17 DIAGNOSIS — R278 Other lack of coordination: Secondary | ICD-10-CM | POA: Diagnosis not present

## 2018-03-17 DIAGNOSIS — M6281 Muscle weakness (generalized): Secondary | ICD-10-CM | POA: Diagnosis not present

## 2018-03-21 DIAGNOSIS — M6281 Muscle weakness (generalized): Secondary | ICD-10-CM | POA: Diagnosis not present

## 2018-03-21 DIAGNOSIS — R2689 Other abnormalities of gait and mobility: Secondary | ICD-10-CM | POA: Diagnosis not present

## 2018-03-21 DIAGNOSIS — R488 Other symbolic dysfunctions: Secondary | ICD-10-CM | POA: Diagnosis not present

## 2018-03-21 DIAGNOSIS — R41841 Cognitive communication deficit: Secondary | ICD-10-CM | POA: Diagnosis not present

## 2018-03-21 DIAGNOSIS — R278 Other lack of coordination: Secondary | ICD-10-CM | POA: Diagnosis not present

## 2018-03-21 DIAGNOSIS — R296 Repeated falls: Secondary | ICD-10-CM | POA: Diagnosis not present

## 2018-03-21 DIAGNOSIS — M545 Low back pain: Secondary | ICD-10-CM | POA: Diagnosis not present

## 2018-03-22 DIAGNOSIS — M79674 Pain in right toe(s): Secondary | ICD-10-CM | POA: Diagnosis not present

## 2018-03-22 DIAGNOSIS — B351 Tinea unguium: Secondary | ICD-10-CM | POA: Diagnosis not present

## 2018-03-22 DIAGNOSIS — M79675 Pain in left toe(s): Secondary | ICD-10-CM | POA: Diagnosis not present

## 2018-03-22 DIAGNOSIS — Q828 Other specified congenital malformations of skin: Secondary | ICD-10-CM | POA: Diagnosis not present

## 2018-03-23 DIAGNOSIS — M6281 Muscle weakness (generalized): Secondary | ICD-10-CM | POA: Diagnosis not present

## 2018-03-23 DIAGNOSIS — R2689 Other abnormalities of gait and mobility: Secondary | ICD-10-CM | POA: Diagnosis not present

## 2018-03-23 DIAGNOSIS — R278 Other lack of coordination: Secondary | ICD-10-CM | POA: Diagnosis not present

## 2018-03-23 DIAGNOSIS — M545 Low back pain: Secondary | ICD-10-CM | POA: Diagnosis not present

## 2018-03-23 DIAGNOSIS — R41841 Cognitive communication deficit: Secondary | ICD-10-CM | POA: Diagnosis not present

## 2018-03-23 DIAGNOSIS — R296 Repeated falls: Secondary | ICD-10-CM | POA: Diagnosis not present

## 2018-03-24 DIAGNOSIS — R278 Other lack of coordination: Secondary | ICD-10-CM | POA: Diagnosis not present

## 2018-03-24 DIAGNOSIS — M6281 Muscle weakness (generalized): Secondary | ICD-10-CM | POA: Diagnosis not present

## 2018-03-24 DIAGNOSIS — M545 Low back pain: Secondary | ICD-10-CM | POA: Diagnosis not present

## 2018-03-24 DIAGNOSIS — R41841 Cognitive communication deficit: Secondary | ICD-10-CM | POA: Diagnosis not present

## 2018-03-24 DIAGNOSIS — R296 Repeated falls: Secondary | ICD-10-CM | POA: Diagnosis not present

## 2018-03-24 DIAGNOSIS — R2689 Other abnormalities of gait and mobility: Secondary | ICD-10-CM | POA: Diagnosis not present

## 2018-03-27 DIAGNOSIS — R41841 Cognitive communication deficit: Secondary | ICD-10-CM | POA: Diagnosis not present

## 2018-03-27 DIAGNOSIS — R2689 Other abnormalities of gait and mobility: Secondary | ICD-10-CM | POA: Diagnosis not present

## 2018-03-27 DIAGNOSIS — R300 Dysuria: Secondary | ICD-10-CM | POA: Diagnosis not present

## 2018-03-27 DIAGNOSIS — M545 Low back pain: Secondary | ICD-10-CM | POA: Diagnosis not present

## 2018-03-27 DIAGNOSIS — N390 Urinary tract infection, site not specified: Secondary | ICD-10-CM | POA: Diagnosis not present

## 2018-03-27 DIAGNOSIS — R296 Repeated falls: Secondary | ICD-10-CM | POA: Diagnosis not present

## 2018-03-27 DIAGNOSIS — R278 Other lack of coordination: Secondary | ICD-10-CM | POA: Diagnosis not present

## 2018-03-27 DIAGNOSIS — M6281 Muscle weakness (generalized): Secondary | ICD-10-CM | POA: Diagnosis not present

## 2018-03-29 DIAGNOSIS — R278 Other lack of coordination: Secondary | ICD-10-CM | POA: Diagnosis not present

## 2018-03-29 DIAGNOSIS — R2689 Other abnormalities of gait and mobility: Secondary | ICD-10-CM | POA: Diagnosis not present

## 2018-03-29 DIAGNOSIS — R41841 Cognitive communication deficit: Secondary | ICD-10-CM | POA: Diagnosis not present

## 2018-03-29 DIAGNOSIS — R296 Repeated falls: Secondary | ICD-10-CM | POA: Diagnosis not present

## 2018-03-29 DIAGNOSIS — M6281 Muscle weakness (generalized): Secondary | ICD-10-CM | POA: Diagnosis not present

## 2018-03-29 DIAGNOSIS — M545 Low back pain: Secondary | ICD-10-CM | POA: Diagnosis not present

## 2018-03-30 DIAGNOSIS — R41841 Cognitive communication deficit: Secondary | ICD-10-CM | POA: Diagnosis not present

## 2018-03-30 DIAGNOSIS — R296 Repeated falls: Secondary | ICD-10-CM | POA: Diagnosis not present

## 2018-03-30 DIAGNOSIS — R2689 Other abnormalities of gait and mobility: Secondary | ICD-10-CM | POA: Diagnosis not present

## 2018-03-30 DIAGNOSIS — M545 Low back pain: Secondary | ICD-10-CM | POA: Diagnosis not present

## 2018-03-30 DIAGNOSIS — M6281 Muscle weakness (generalized): Secondary | ICD-10-CM | POA: Diagnosis not present

## 2018-03-30 DIAGNOSIS — R278 Other lack of coordination: Secondary | ICD-10-CM | POA: Diagnosis not present

## 2018-03-31 DIAGNOSIS — M545 Low back pain: Secondary | ICD-10-CM | POA: Diagnosis not present

## 2018-03-31 DIAGNOSIS — R278 Other lack of coordination: Secondary | ICD-10-CM | POA: Diagnosis not present

## 2018-03-31 DIAGNOSIS — R2689 Other abnormalities of gait and mobility: Secondary | ICD-10-CM | POA: Diagnosis not present

## 2018-03-31 DIAGNOSIS — R296 Repeated falls: Secondary | ICD-10-CM | POA: Diagnosis not present

## 2018-03-31 DIAGNOSIS — R41841 Cognitive communication deficit: Secondary | ICD-10-CM | POA: Diagnosis not present

## 2018-03-31 DIAGNOSIS — M6281 Muscle weakness (generalized): Secondary | ICD-10-CM | POA: Diagnosis not present

## 2018-04-03 ENCOUNTER — Other Ambulatory Visit: Payer: Self-pay | Admitting: Internal Medicine

## 2018-04-04 DIAGNOSIS — R296 Repeated falls: Secondary | ICD-10-CM | POA: Diagnosis not present

## 2018-04-04 DIAGNOSIS — R41841 Cognitive communication deficit: Secondary | ICD-10-CM | POA: Diagnosis not present

## 2018-04-04 DIAGNOSIS — R2689 Other abnormalities of gait and mobility: Secondary | ICD-10-CM | POA: Diagnosis not present

## 2018-04-04 DIAGNOSIS — M6281 Muscle weakness (generalized): Secondary | ICD-10-CM | POA: Diagnosis not present

## 2018-04-04 DIAGNOSIS — R278 Other lack of coordination: Secondary | ICD-10-CM | POA: Diagnosis not present

## 2018-04-04 DIAGNOSIS — M545 Low back pain: Secondary | ICD-10-CM | POA: Diagnosis not present

## 2018-04-05 DIAGNOSIS — M545 Low back pain: Secondary | ICD-10-CM | POA: Diagnosis not present

## 2018-04-05 DIAGNOSIS — M6281 Muscle weakness (generalized): Secondary | ICD-10-CM | POA: Diagnosis not present

## 2018-04-05 DIAGNOSIS — R278 Other lack of coordination: Secondary | ICD-10-CM | POA: Diagnosis not present

## 2018-04-05 DIAGNOSIS — R296 Repeated falls: Secondary | ICD-10-CM | POA: Diagnosis not present

## 2018-04-05 DIAGNOSIS — R41841 Cognitive communication deficit: Secondary | ICD-10-CM | POA: Diagnosis not present

## 2018-04-05 DIAGNOSIS — R2689 Other abnormalities of gait and mobility: Secondary | ICD-10-CM | POA: Diagnosis not present

## 2018-04-06 DIAGNOSIS — R278 Other lack of coordination: Secondary | ICD-10-CM | POA: Diagnosis not present

## 2018-04-06 DIAGNOSIS — R41841 Cognitive communication deficit: Secondary | ICD-10-CM | POA: Diagnosis not present

## 2018-04-06 DIAGNOSIS — M6281 Muscle weakness (generalized): Secondary | ICD-10-CM | POA: Diagnosis not present

## 2018-04-06 DIAGNOSIS — M545 Low back pain: Secondary | ICD-10-CM | POA: Diagnosis not present

## 2018-04-06 DIAGNOSIS — R2689 Other abnormalities of gait and mobility: Secondary | ICD-10-CM | POA: Diagnosis not present

## 2018-04-06 DIAGNOSIS — R296 Repeated falls: Secondary | ICD-10-CM | POA: Diagnosis not present

## 2018-04-07 DIAGNOSIS — M545 Low back pain: Secondary | ICD-10-CM | POA: Diagnosis not present

## 2018-04-07 DIAGNOSIS — R2689 Other abnormalities of gait and mobility: Secondary | ICD-10-CM | POA: Diagnosis not present

## 2018-04-07 DIAGNOSIS — R278 Other lack of coordination: Secondary | ICD-10-CM | POA: Diagnosis not present

## 2018-04-07 DIAGNOSIS — M6281 Muscle weakness (generalized): Secondary | ICD-10-CM | POA: Diagnosis not present

## 2018-04-07 DIAGNOSIS — R41841 Cognitive communication deficit: Secondary | ICD-10-CM | POA: Diagnosis not present

## 2018-04-07 DIAGNOSIS — R296 Repeated falls: Secondary | ICD-10-CM | POA: Diagnosis not present

## 2018-04-10 DIAGNOSIS — R296 Repeated falls: Secondary | ICD-10-CM | POA: Diagnosis not present

## 2018-04-10 DIAGNOSIS — R2689 Other abnormalities of gait and mobility: Secondary | ICD-10-CM | POA: Diagnosis not present

## 2018-04-10 DIAGNOSIS — R278 Other lack of coordination: Secondary | ICD-10-CM | POA: Diagnosis not present

## 2018-04-10 DIAGNOSIS — R41841 Cognitive communication deficit: Secondary | ICD-10-CM | POA: Diagnosis not present

## 2018-04-10 DIAGNOSIS — M6281 Muscle weakness (generalized): Secondary | ICD-10-CM | POA: Diagnosis not present

## 2018-04-10 DIAGNOSIS — M545 Low back pain: Secondary | ICD-10-CM | POA: Diagnosis not present

## 2018-04-11 ENCOUNTER — Other Ambulatory Visit: Payer: Self-pay | Admitting: Internal Medicine

## 2018-04-11 DIAGNOSIS — R41841 Cognitive communication deficit: Secondary | ICD-10-CM | POA: Diagnosis not present

## 2018-04-11 DIAGNOSIS — R296 Repeated falls: Secondary | ICD-10-CM | POA: Diagnosis not present

## 2018-04-11 DIAGNOSIS — R2689 Other abnormalities of gait and mobility: Secondary | ICD-10-CM | POA: Diagnosis not present

## 2018-04-11 DIAGNOSIS — M545 Low back pain: Secondary | ICD-10-CM | POA: Diagnosis not present

## 2018-04-11 DIAGNOSIS — M6281 Muscle weakness (generalized): Secondary | ICD-10-CM | POA: Diagnosis not present

## 2018-04-11 DIAGNOSIS — R278 Other lack of coordination: Secondary | ICD-10-CM | POA: Diagnosis not present

## 2018-04-13 DIAGNOSIS — R2689 Other abnormalities of gait and mobility: Secondary | ICD-10-CM | POA: Diagnosis not present

## 2018-04-13 DIAGNOSIS — R296 Repeated falls: Secondary | ICD-10-CM | POA: Diagnosis not present

## 2018-04-13 DIAGNOSIS — M545 Low back pain: Secondary | ICD-10-CM | POA: Diagnosis not present

## 2018-04-13 DIAGNOSIS — M6281 Muscle weakness (generalized): Secondary | ICD-10-CM | POA: Diagnosis not present

## 2018-04-13 DIAGNOSIS — R41841 Cognitive communication deficit: Secondary | ICD-10-CM | POA: Diagnosis not present

## 2018-04-13 DIAGNOSIS — R278 Other lack of coordination: Secondary | ICD-10-CM | POA: Diagnosis not present

## 2018-04-14 DIAGNOSIS — M545 Low back pain: Secondary | ICD-10-CM | POA: Diagnosis not present

## 2018-04-14 DIAGNOSIS — M6281 Muscle weakness (generalized): Secondary | ICD-10-CM | POA: Diagnosis not present

## 2018-04-14 DIAGNOSIS — R296 Repeated falls: Secondary | ICD-10-CM | POA: Diagnosis not present

## 2018-04-14 DIAGNOSIS — R41841 Cognitive communication deficit: Secondary | ICD-10-CM | POA: Diagnosis not present

## 2018-04-14 DIAGNOSIS — R2689 Other abnormalities of gait and mobility: Secondary | ICD-10-CM | POA: Diagnosis not present

## 2018-04-14 DIAGNOSIS — R278 Other lack of coordination: Secondary | ICD-10-CM | POA: Diagnosis not present

## 2018-04-17 ENCOUNTER — Ambulatory Visit (INDEPENDENT_AMBULATORY_CARE_PROVIDER_SITE_OTHER): Payer: Medicare Other | Admitting: Neurology

## 2018-04-17 ENCOUNTER — Other Ambulatory Visit: Payer: Self-pay

## 2018-04-17 ENCOUNTER — Encounter: Payer: Self-pay | Admitting: Neurology

## 2018-04-17 VITALS — BP 126/68 | HR 84 | Ht 64.0 in | Wt 129.0 lb

## 2018-04-17 DIAGNOSIS — M545 Low back pain: Secondary | ICD-10-CM | POA: Diagnosis not present

## 2018-04-17 DIAGNOSIS — F0391 Unspecified dementia with behavioral disturbance: Secondary | ICD-10-CM | POA: Diagnosis not present

## 2018-04-17 DIAGNOSIS — M6281 Muscle weakness (generalized): Secondary | ICD-10-CM | POA: Diagnosis not present

## 2018-04-17 DIAGNOSIS — R296 Repeated falls: Secondary | ICD-10-CM | POA: Diagnosis not present

## 2018-04-17 DIAGNOSIS — R2689 Other abnormalities of gait and mobility: Secondary | ICD-10-CM | POA: Diagnosis not present

## 2018-04-17 DIAGNOSIS — F03B18 Unspecified dementia, moderate, with other behavioral disturbance: Secondary | ICD-10-CM

## 2018-04-17 DIAGNOSIS — R278 Other lack of coordination: Secondary | ICD-10-CM | POA: Diagnosis not present

## 2018-04-17 DIAGNOSIS — R41841 Cognitive communication deficit: Secondary | ICD-10-CM | POA: Diagnosis not present

## 2018-04-17 MED ORDER — RIVASTIGMINE TARTRATE 3 MG PO CAPS: 3.0000 mg | ORAL_CAPSULE | Freq: Two times a day (BID) | ORAL | 11 refills | Status: DC
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
## 2018-04-17 NOTE — Patient Instructions (Signed)
1. Start Exelon (Rivastigmine) 3mg  twice a day  2. Follow-up in 6 months, call for any changes  FALL PRECAUTIONS: Be cautious when walking. Scan the area for obstacles that may increase the risk of trips and falls. When getting up in the mornings, sit up at the edge of the bed for a few minutes before getting out of bed. Consider elevating the bed at the head end to avoid drop of blood pressure when getting up. Walk always in a well-lit room (use night lights in the walls). Avoid area rugs or power cords from appliances in the middle of the walkways. Use a walker or a cane if necessary and consider physical therapy for balance exercise. Get your eyesight checked regularly.  HOME SAFETY: Consider the safety of the kitchen when operating appliances like stoves, microwave oven, and blender. Consider having supervision and share cooking responsibilities until no longer able to participate in those. Accidents with firearms and other hazards in the house should be identified and addressed as well.  ABILITY TO BE LEFT ALONE: If patient is unable to contact 911 operator, consider using LifeLine, or when the need is there, arrange for someone to stay with patients. Smoking is a fire hazard, consider supervision or cessation. Risk of wandering should be assessed by caregiver and if detected at any point, supervision and safe proof recommendations should be instituted.  RECOMMENDATIONS FOR ALL PATIENTS WITH MEMORY PROBLEMS: 1. Continue to exercise (Recommend 30 minutes of walking everyday, or 3 hours every week) 2. Increase social interactions - continue going to [place] and enjoy social gatherings with friends and family 3. Eat healthy, avoid fried foods and eat more fruits and vegetables 4. Maintain adequate blood pressure, blood sugar, and blood cholesterol level. Reducing the risk of stroke and cardiovascular disease also helps promoting better memory. 5. Avoid stressful situations. Live a simple life and  avoid aggravations. Organize your time and prepare for the next day in anticipation. 6. Sleep well, avoid any interruptions of sleep and avoid any distractions in the bedroom that may interfere with adequate sleep quality 7. Avoid sugar, avoid sweets as there is a strong link between excessive sugar intake, diabetes, and cognitive impairment The Mediterranean diet has been shown to help patients reduce the risk of progressive memory disorders and reduces cardiovascular risk. This includes eating fish, eat fruits and green leafy vegetables, nuts like almonds and hazelnuts, walnuts, and also use olive oil. Avoid fast foods and fried foods as much as possible. Avoid sweets and sugar as sugar use has been linked to worsening of memory function.  There is always a concern of gradual progression of memory problems. If this is the case, then we may need to adjust level of care according to patient needs. Support, both to the patient and caregiver, should then be put into place.

## 2018-04-18 DIAGNOSIS — M6281 Muscle weakness (generalized): Secondary | ICD-10-CM | POA: Diagnosis not present

## 2018-04-18 DIAGNOSIS — R278 Other lack of coordination: Secondary | ICD-10-CM | POA: Diagnosis not present

## 2018-04-18 DIAGNOSIS — M545 Low back pain: Secondary | ICD-10-CM | POA: Diagnosis not present

## 2018-04-18 DIAGNOSIS — R2689 Other abnormalities of gait and mobility: Secondary | ICD-10-CM | POA: Diagnosis not present

## 2018-04-18 DIAGNOSIS — R41841 Cognitive communication deficit: Secondary | ICD-10-CM | POA: Diagnosis not present

## 2018-04-18 DIAGNOSIS — R296 Repeated falls: Secondary | ICD-10-CM | POA: Diagnosis not present

## 2018-04-20 DIAGNOSIS — R278 Other lack of coordination: Secondary | ICD-10-CM | POA: Diagnosis not present

## 2018-04-20 DIAGNOSIS — R488 Other symbolic dysfunctions: Secondary | ICD-10-CM | POA: Diagnosis not present

## 2018-04-20 DIAGNOSIS — M6281 Muscle weakness (generalized): Secondary | ICD-10-CM | POA: Diagnosis not present

## 2018-04-20 DIAGNOSIS — R296 Repeated falls: Secondary | ICD-10-CM | POA: Diagnosis not present

## 2018-04-20 DIAGNOSIS — M545 Low back pain: Secondary | ICD-10-CM | POA: Diagnosis not present

## 2018-04-20 DIAGNOSIS — R2689 Other abnormalities of gait and mobility: Secondary | ICD-10-CM | POA: Diagnosis not present

## 2018-04-25 DIAGNOSIS — R296 Repeated falls: Secondary | ICD-10-CM | POA: Diagnosis not present

## 2018-04-25 DIAGNOSIS — R488 Other symbolic dysfunctions: Secondary | ICD-10-CM | POA: Diagnosis not present

## 2018-04-25 DIAGNOSIS — R2689 Other abnormalities of gait and mobility: Secondary | ICD-10-CM | POA: Diagnosis not present

## 2018-04-25 DIAGNOSIS — M545 Low back pain: Secondary | ICD-10-CM | POA: Diagnosis not present

## 2018-04-25 DIAGNOSIS — M6281 Muscle weakness (generalized): Secondary | ICD-10-CM | POA: Diagnosis not present

## 2018-04-25 DIAGNOSIS — R278 Other lack of coordination: Secondary | ICD-10-CM | POA: Diagnosis not present

## 2018-04-27 DIAGNOSIS — Q828 Other specified congenital malformations of skin: Secondary | ICD-10-CM | POA: Diagnosis not present

## 2018-04-27 DIAGNOSIS — M898X9 Other specified disorders of bone, unspecified site: Secondary | ICD-10-CM | POA: Diagnosis not present

## 2018-05-01 DIAGNOSIS — M545 Low back pain: Secondary | ICD-10-CM | POA: Diagnosis not present

## 2018-05-01 DIAGNOSIS — R2689 Other abnormalities of gait and mobility: Secondary | ICD-10-CM | POA: Diagnosis not present

## 2018-05-01 DIAGNOSIS — R278 Other lack of coordination: Secondary | ICD-10-CM | POA: Diagnosis not present

## 2018-05-01 DIAGNOSIS — R488 Other symbolic dysfunctions: Secondary | ICD-10-CM | POA: Diagnosis not present

## 2018-05-01 DIAGNOSIS — R296 Repeated falls: Secondary | ICD-10-CM | POA: Diagnosis not present

## 2018-05-01 DIAGNOSIS — M6281 Muscle weakness (generalized): Secondary | ICD-10-CM | POA: Diagnosis not present

## 2018-05-03 ENCOUNTER — Other Ambulatory Visit: Payer: Self-pay | Admitting: Internal Medicine

## 2018-05-03 DIAGNOSIS — R488 Other symbolic dysfunctions: Secondary | ICD-10-CM | POA: Diagnosis not present

## 2018-05-03 DIAGNOSIS — R296 Repeated falls: Secondary | ICD-10-CM | POA: Diagnosis not present

## 2018-05-03 DIAGNOSIS — R2689 Other abnormalities of gait and mobility: Secondary | ICD-10-CM | POA: Diagnosis not present

## 2018-05-03 DIAGNOSIS — M545 Low back pain: Secondary | ICD-10-CM | POA: Diagnosis not present

## 2018-05-03 DIAGNOSIS — R278 Other lack of coordination: Secondary | ICD-10-CM | POA: Diagnosis not present

## 2018-05-03 DIAGNOSIS — M6281 Muscle weakness (generalized): Secondary | ICD-10-CM | POA: Diagnosis not present

## 2018-05-04 DIAGNOSIS — M6281 Muscle weakness (generalized): Secondary | ICD-10-CM | POA: Diagnosis not present

## 2018-05-04 DIAGNOSIS — R278 Other lack of coordination: Secondary | ICD-10-CM | POA: Diagnosis not present

## 2018-05-04 DIAGNOSIS — R488 Other symbolic dysfunctions: Secondary | ICD-10-CM | POA: Diagnosis not present

## 2018-05-04 DIAGNOSIS — M545 Low back pain: Secondary | ICD-10-CM | POA: Diagnosis not present

## 2018-05-04 DIAGNOSIS — R296 Repeated falls: Secondary | ICD-10-CM | POA: Diagnosis not present

## 2018-05-04 DIAGNOSIS — R2689 Other abnormalities of gait and mobility: Secondary | ICD-10-CM | POA: Diagnosis not present

## 2018-05-06 DIAGNOSIS — R278 Other lack of coordination: Secondary | ICD-10-CM | POA: Diagnosis not present

## 2018-05-06 DIAGNOSIS — R2689 Other abnormalities of gait and mobility: Secondary | ICD-10-CM | POA: Diagnosis not present

## 2018-05-06 DIAGNOSIS — R488 Other symbolic dysfunctions: Secondary | ICD-10-CM | POA: Diagnosis not present

## 2018-05-06 DIAGNOSIS — R296 Repeated falls: Secondary | ICD-10-CM | POA: Diagnosis not present

## 2018-05-06 DIAGNOSIS — M545 Low back pain: Secondary | ICD-10-CM | POA: Diagnosis not present

## 2018-05-06 DIAGNOSIS — M6281 Muscle weakness (generalized): Secondary | ICD-10-CM | POA: Diagnosis not present

## 2018-05-08 DIAGNOSIS — N183 Chronic kidney disease, stage 3 (moderate): Secondary | ICD-10-CM | POA: Diagnosis not present

## 2018-05-08 DIAGNOSIS — R600 Localized edema: Secondary | ICD-10-CM | POA: Diagnosis not present

## 2018-05-08 DIAGNOSIS — I10 Essential (primary) hypertension: Secondary | ICD-10-CM | POA: Diagnosis not present

## 2018-05-08 DIAGNOSIS — I482 Chronic atrial fibrillation, unspecified: Secondary | ICD-10-CM | POA: Diagnosis not present

## 2018-05-09 DIAGNOSIS — M6281 Muscle weakness (generalized): Secondary | ICD-10-CM | POA: Diagnosis not present

## 2018-05-09 DIAGNOSIS — R2689 Other abnormalities of gait and mobility: Secondary | ICD-10-CM | POA: Diagnosis not present

## 2018-05-09 DIAGNOSIS — M545 Low back pain: Secondary | ICD-10-CM | POA: Diagnosis not present

## 2018-05-09 DIAGNOSIS — R278 Other lack of coordination: Secondary | ICD-10-CM | POA: Diagnosis not present

## 2018-05-09 DIAGNOSIS — R488 Other symbolic dysfunctions: Secondary | ICD-10-CM | POA: Diagnosis not present

## 2018-05-09 DIAGNOSIS — R296 Repeated falls: Secondary | ICD-10-CM | POA: Diagnosis not present

## 2018-05-15 DIAGNOSIS — I10 Essential (primary) hypertension: Secondary | ICD-10-CM | POA: Diagnosis not present

## 2018-05-15 DIAGNOSIS — N2581 Secondary hyperparathyroidism of renal origin: Secondary | ICD-10-CM | POA: Diagnosis not present

## 2018-05-15 DIAGNOSIS — N183 Chronic kidney disease, stage 3 (moderate): Secondary | ICD-10-CM | POA: Diagnosis not present

## 2018-05-15 DIAGNOSIS — D631 Anemia in chronic kidney disease: Secondary | ICD-10-CM | POA: Diagnosis not present

## 2018-05-17 DIAGNOSIS — R296 Repeated falls: Secondary | ICD-10-CM | POA: Diagnosis not present

## 2018-05-17 DIAGNOSIS — R2689 Other abnormalities of gait and mobility: Secondary | ICD-10-CM | POA: Diagnosis not present

## 2018-05-17 DIAGNOSIS — M545 Low back pain: Secondary | ICD-10-CM | POA: Diagnosis not present

## 2018-05-17 DIAGNOSIS — R488 Other symbolic dysfunctions: Secondary | ICD-10-CM | POA: Diagnosis not present

## 2018-05-17 DIAGNOSIS — R278 Other lack of coordination: Secondary | ICD-10-CM | POA: Diagnosis not present

## 2018-05-17 DIAGNOSIS — M6281 Muscle weakness (generalized): Secondary | ICD-10-CM | POA: Diagnosis not present

## 2018-05-19 DIAGNOSIS — R2689 Other abnormalities of gait and mobility: Secondary | ICD-10-CM | POA: Diagnosis not present

## 2018-05-19 DIAGNOSIS — R488 Other symbolic dysfunctions: Secondary | ICD-10-CM | POA: Diagnosis not present

## 2018-05-19 DIAGNOSIS — M545 Low back pain: Secondary | ICD-10-CM | POA: Diagnosis not present

## 2018-05-19 DIAGNOSIS — R278 Other lack of coordination: Secondary | ICD-10-CM | POA: Diagnosis not present

## 2018-05-19 DIAGNOSIS — M6281 Muscle weakness (generalized): Secondary | ICD-10-CM | POA: Diagnosis not present

## 2018-05-19 DIAGNOSIS — R296 Repeated falls: Secondary | ICD-10-CM | POA: Diagnosis not present

## 2018-05-22 DIAGNOSIS — R296 Repeated falls: Secondary | ICD-10-CM | POA: Diagnosis not present

## 2018-05-22 DIAGNOSIS — M6281 Muscle weakness (generalized): Secondary | ICD-10-CM | POA: Diagnosis not present

## 2018-05-22 DIAGNOSIS — R488 Other symbolic dysfunctions: Secondary | ICD-10-CM | POA: Diagnosis not present

## 2018-05-22 DIAGNOSIS — R278 Other lack of coordination: Secondary | ICD-10-CM | POA: Diagnosis not present

## 2018-05-22 DIAGNOSIS — R41841 Cognitive communication deficit: Secondary | ICD-10-CM | POA: Diagnosis not present

## 2018-05-24 DIAGNOSIS — R41841 Cognitive communication deficit: Secondary | ICD-10-CM | POA: Diagnosis not present

## 2018-05-24 DIAGNOSIS — Z961 Presence of intraocular lens: Secondary | ICD-10-CM | POA: Diagnosis not present

## 2018-05-24 DIAGNOSIS — M6281 Muscle weakness (generalized): Secondary | ICD-10-CM | POA: Diagnosis not present

## 2018-05-24 DIAGNOSIS — R296 Repeated falls: Secondary | ICD-10-CM | POA: Diagnosis not present

## 2018-05-24 DIAGNOSIS — R488 Other symbolic dysfunctions: Secondary | ICD-10-CM | POA: Diagnosis not present

## 2018-05-24 DIAGNOSIS — R278 Other lack of coordination: Secondary | ICD-10-CM | POA: Diagnosis not present

## 2018-05-26 DIAGNOSIS — R488 Other symbolic dysfunctions: Secondary | ICD-10-CM | POA: Diagnosis not present

## 2018-05-26 DIAGNOSIS — R278 Other lack of coordination: Secondary | ICD-10-CM | POA: Diagnosis not present

## 2018-05-26 DIAGNOSIS — R296 Repeated falls: Secondary | ICD-10-CM | POA: Diagnosis not present

## 2018-05-26 DIAGNOSIS — R41841 Cognitive communication deficit: Secondary | ICD-10-CM | POA: Diagnosis not present

## 2018-05-26 DIAGNOSIS — M6281 Muscle weakness (generalized): Secondary | ICD-10-CM | POA: Diagnosis not present

## 2018-05-29 DIAGNOSIS — R278 Other lack of coordination: Secondary | ICD-10-CM | POA: Diagnosis not present

## 2018-05-29 DIAGNOSIS — R488 Other symbolic dysfunctions: Secondary | ICD-10-CM | POA: Diagnosis not present

## 2018-05-29 DIAGNOSIS — M6281 Muscle weakness (generalized): Secondary | ICD-10-CM | POA: Diagnosis not present

## 2018-05-29 DIAGNOSIS — R296 Repeated falls: Secondary | ICD-10-CM | POA: Diagnosis not present

## 2018-05-29 DIAGNOSIS — R41841 Cognitive communication deficit: Secondary | ICD-10-CM | POA: Diagnosis not present

## 2018-05-31 DIAGNOSIS — R278 Other lack of coordination: Secondary | ICD-10-CM | POA: Diagnosis not present

## 2018-05-31 DIAGNOSIS — R488 Other symbolic dysfunctions: Secondary | ICD-10-CM | POA: Diagnosis not present

## 2018-05-31 DIAGNOSIS — M6281 Muscle weakness (generalized): Secondary | ICD-10-CM | POA: Diagnosis not present

## 2018-05-31 DIAGNOSIS — R296 Repeated falls: Secondary | ICD-10-CM | POA: Diagnosis not present

## 2018-05-31 DIAGNOSIS — R41841 Cognitive communication deficit: Secondary | ICD-10-CM | POA: Diagnosis not present

## 2018-06-05 DIAGNOSIS — R41841 Cognitive communication deficit: Secondary | ICD-10-CM | POA: Diagnosis not present

## 2018-06-05 DIAGNOSIS — R278 Other lack of coordination: Secondary | ICD-10-CM | POA: Diagnosis not present

## 2018-06-05 DIAGNOSIS — R296 Repeated falls: Secondary | ICD-10-CM | POA: Diagnosis not present

## 2018-06-05 DIAGNOSIS — R488 Other symbolic dysfunctions: Secondary | ICD-10-CM | POA: Diagnosis not present

## 2018-06-05 DIAGNOSIS — M6281 Muscle weakness (generalized): Secondary | ICD-10-CM | POA: Diagnosis not present

## 2018-06-07 DIAGNOSIS — R488 Other symbolic dysfunctions: Secondary | ICD-10-CM | POA: Diagnosis not present

## 2018-06-07 DIAGNOSIS — R296 Repeated falls: Secondary | ICD-10-CM | POA: Diagnosis not present

## 2018-06-07 DIAGNOSIS — R41841 Cognitive communication deficit: Secondary | ICD-10-CM | POA: Diagnosis not present

## 2018-06-07 DIAGNOSIS — M6281 Muscle weakness (generalized): Secondary | ICD-10-CM | POA: Diagnosis not present

## 2018-06-07 DIAGNOSIS — R278 Other lack of coordination: Secondary | ICD-10-CM | POA: Diagnosis not present

## 2018-06-08 DIAGNOSIS — M6281 Muscle weakness (generalized): Secondary | ICD-10-CM | POA: Diagnosis not present

## 2018-06-08 DIAGNOSIS — R41841 Cognitive communication deficit: Secondary | ICD-10-CM | POA: Diagnosis not present

## 2018-06-08 DIAGNOSIS — R278 Other lack of coordination: Secondary | ICD-10-CM | POA: Diagnosis not present

## 2018-06-08 DIAGNOSIS — R296 Repeated falls: Secondary | ICD-10-CM | POA: Diagnosis not present

## 2018-06-08 DIAGNOSIS — R488 Other symbolic dysfunctions: Secondary | ICD-10-CM | POA: Diagnosis not present

## 2018-06-09 DIAGNOSIS — M6281 Muscle weakness (generalized): Secondary | ICD-10-CM | POA: Diagnosis not present

## 2018-06-09 DIAGNOSIS — R296 Repeated falls: Secondary | ICD-10-CM | POA: Diagnosis not present

## 2018-06-09 DIAGNOSIS — R41841 Cognitive communication deficit: Secondary | ICD-10-CM | POA: Diagnosis not present

## 2018-06-09 DIAGNOSIS — R488 Other symbolic dysfunctions: Secondary | ICD-10-CM | POA: Diagnosis not present

## 2018-06-09 DIAGNOSIS — R278 Other lack of coordination: Secondary | ICD-10-CM | POA: Diagnosis not present

## 2018-06-12 DIAGNOSIS — R278 Other lack of coordination: Secondary | ICD-10-CM | POA: Diagnosis not present

## 2018-06-12 DIAGNOSIS — R41841 Cognitive communication deficit: Secondary | ICD-10-CM | POA: Diagnosis not present

## 2018-06-12 DIAGNOSIS — R296 Repeated falls: Secondary | ICD-10-CM | POA: Diagnosis not present

## 2018-06-12 DIAGNOSIS — M6281 Muscle weakness (generalized): Secondary | ICD-10-CM | POA: Diagnosis not present

## 2018-06-12 DIAGNOSIS — R488 Other symbolic dysfunctions: Secondary | ICD-10-CM | POA: Diagnosis not present

## 2018-06-13 DIAGNOSIS — R41841 Cognitive communication deficit: Secondary | ICD-10-CM | POA: Diagnosis not present

## 2018-06-13 DIAGNOSIS — M6281 Muscle weakness (generalized): Secondary | ICD-10-CM | POA: Diagnosis not present

## 2018-06-13 DIAGNOSIS — R278 Other lack of coordination: Secondary | ICD-10-CM | POA: Diagnosis not present

## 2018-06-13 DIAGNOSIS — R296 Repeated falls: Secondary | ICD-10-CM | POA: Diagnosis not present

## 2018-06-13 DIAGNOSIS — R488 Other symbolic dysfunctions: Secondary | ICD-10-CM | POA: Diagnosis not present

## 2018-06-14 DIAGNOSIS — R278 Other lack of coordination: Secondary | ICD-10-CM | POA: Diagnosis not present

## 2018-06-14 DIAGNOSIS — R41841 Cognitive communication deficit: Secondary | ICD-10-CM | POA: Diagnosis not present

## 2018-06-14 DIAGNOSIS — M6281 Muscle weakness (generalized): Secondary | ICD-10-CM | POA: Diagnosis not present

## 2018-06-14 DIAGNOSIS — R296 Repeated falls: Secondary | ICD-10-CM | POA: Diagnosis not present

## 2018-06-14 DIAGNOSIS — R488 Other symbolic dysfunctions: Secondary | ICD-10-CM | POA: Diagnosis not present

## 2018-06-15 DIAGNOSIS — R296 Repeated falls: Secondary | ICD-10-CM | POA: Diagnosis not present

## 2018-06-15 DIAGNOSIS — R278 Other lack of coordination: Secondary | ICD-10-CM | POA: Diagnosis not present

## 2018-06-15 DIAGNOSIS — R41841 Cognitive communication deficit: Secondary | ICD-10-CM | POA: Diagnosis not present

## 2018-06-15 DIAGNOSIS — R488 Other symbolic dysfunctions: Secondary | ICD-10-CM | POA: Diagnosis not present

## 2018-06-15 DIAGNOSIS — M6281 Muscle weakness (generalized): Secondary | ICD-10-CM | POA: Diagnosis not present

## 2018-06-16 DIAGNOSIS — R488 Other symbolic dysfunctions: Secondary | ICD-10-CM | POA: Diagnosis not present

## 2018-06-16 DIAGNOSIS — R296 Repeated falls: Secondary | ICD-10-CM | POA: Diagnosis not present

## 2018-06-16 DIAGNOSIS — R41841 Cognitive communication deficit: Secondary | ICD-10-CM | POA: Diagnosis not present

## 2018-06-16 DIAGNOSIS — M6281 Muscle weakness (generalized): Secondary | ICD-10-CM | POA: Diagnosis not present

## 2018-06-16 DIAGNOSIS — R278 Other lack of coordination: Secondary | ICD-10-CM | POA: Diagnosis not present

## 2018-06-19 DIAGNOSIS — M6281 Muscle weakness (generalized): Secondary | ICD-10-CM | POA: Diagnosis not present

## 2018-06-19 DIAGNOSIS — R488 Other symbolic dysfunctions: Secondary | ICD-10-CM | POA: Diagnosis not present

## 2018-06-19 DIAGNOSIS — R41841 Cognitive communication deficit: Secondary | ICD-10-CM | POA: Diagnosis not present

## 2018-06-19 DIAGNOSIS — R296 Repeated falls: Secondary | ICD-10-CM | POA: Diagnosis not present

## 2018-06-19 DIAGNOSIS — R278 Other lack of coordination: Secondary | ICD-10-CM | POA: Diagnosis not present

## 2018-06-20 DIAGNOSIS — R278 Other lack of coordination: Secondary | ICD-10-CM | POA: Diagnosis not present

## 2018-06-20 DIAGNOSIS — R41841 Cognitive communication deficit: Secondary | ICD-10-CM | POA: Diagnosis not present

## 2018-06-20 DIAGNOSIS — R296 Repeated falls: Secondary | ICD-10-CM | POA: Diagnosis not present

## 2018-06-20 DIAGNOSIS — M6281 Muscle weakness (generalized): Secondary | ICD-10-CM | POA: Diagnosis not present

## 2018-06-20 DIAGNOSIS — R488 Other symbolic dysfunctions: Secondary | ICD-10-CM | POA: Diagnosis not present

## 2018-06-21 DIAGNOSIS — R41841 Cognitive communication deficit: Secondary | ICD-10-CM | POA: Diagnosis not present

## 2018-06-21 DIAGNOSIS — M6281 Muscle weakness (generalized): Secondary | ICD-10-CM | POA: Diagnosis not present

## 2018-06-21 DIAGNOSIS — R296 Repeated falls: Secondary | ICD-10-CM | POA: Diagnosis not present

## 2018-06-21 DIAGNOSIS — R278 Other lack of coordination: Secondary | ICD-10-CM | POA: Diagnosis not present

## 2018-06-21 DIAGNOSIS — R488 Other symbolic dysfunctions: Secondary | ICD-10-CM | POA: Diagnosis not present

## 2018-06-22 DIAGNOSIS — R488 Other symbolic dysfunctions: Secondary | ICD-10-CM | POA: Diagnosis not present

## 2018-06-22 DIAGNOSIS — R278 Other lack of coordination: Secondary | ICD-10-CM | POA: Diagnosis not present

## 2018-06-22 DIAGNOSIS — R296 Repeated falls: Secondary | ICD-10-CM | POA: Diagnosis not present

## 2018-06-22 DIAGNOSIS — R41841 Cognitive communication deficit: Secondary | ICD-10-CM | POA: Diagnosis not present

## 2018-06-22 DIAGNOSIS — M6281 Muscle weakness (generalized): Secondary | ICD-10-CM | POA: Diagnosis not present

## 2018-06-26 ENCOUNTER — Other Ambulatory Visit: Payer: Self-pay | Admitting: Internal Medicine

## 2018-06-26 DIAGNOSIS — R488 Other symbolic dysfunctions: Secondary | ICD-10-CM | POA: Diagnosis not present

## 2018-06-26 DIAGNOSIS — R41841 Cognitive communication deficit: Secondary | ICD-10-CM | POA: Diagnosis not present

## 2018-06-26 DIAGNOSIS — R278 Other lack of coordination: Secondary | ICD-10-CM | POA: Diagnosis not present

## 2018-06-26 DIAGNOSIS — M6281 Muscle weakness (generalized): Secondary | ICD-10-CM | POA: Diagnosis not present

## 2018-06-26 DIAGNOSIS — R296 Repeated falls: Secondary | ICD-10-CM | POA: Diagnosis not present

## 2018-06-28 DIAGNOSIS — M6281 Muscle weakness (generalized): Secondary | ICD-10-CM | POA: Diagnosis not present

## 2018-06-28 DIAGNOSIS — R296 Repeated falls: Secondary | ICD-10-CM | POA: Diagnosis not present

## 2018-06-28 DIAGNOSIS — R488 Other symbolic dysfunctions: Secondary | ICD-10-CM | POA: Diagnosis not present

## 2018-06-28 DIAGNOSIS — R278 Other lack of coordination: Secondary | ICD-10-CM | POA: Diagnosis not present

## 2018-06-28 DIAGNOSIS — R41841 Cognitive communication deficit: Secondary | ICD-10-CM | POA: Diagnosis not present

## 2018-06-30 DIAGNOSIS — R296 Repeated falls: Secondary | ICD-10-CM | POA: Diagnosis not present

## 2018-06-30 DIAGNOSIS — R278 Other lack of coordination: Secondary | ICD-10-CM | POA: Diagnosis not present

## 2018-06-30 DIAGNOSIS — R41841 Cognitive communication deficit: Secondary | ICD-10-CM | POA: Diagnosis not present

## 2018-06-30 DIAGNOSIS — R488 Other symbolic dysfunctions: Secondary | ICD-10-CM | POA: Diagnosis not present

## 2018-06-30 DIAGNOSIS — M6281 Muscle weakness (generalized): Secondary | ICD-10-CM | POA: Diagnosis not present

## 2018-07-03 DIAGNOSIS — R296 Repeated falls: Secondary | ICD-10-CM | POA: Diagnosis not present

## 2018-07-03 DIAGNOSIS — R488 Other symbolic dysfunctions: Secondary | ICD-10-CM | POA: Diagnosis not present

## 2018-07-03 DIAGNOSIS — R41841 Cognitive communication deficit: Secondary | ICD-10-CM | POA: Diagnosis not present

## 2018-07-03 DIAGNOSIS — R278 Other lack of coordination: Secondary | ICD-10-CM | POA: Diagnosis not present

## 2018-07-03 DIAGNOSIS — M6281 Muscle weakness (generalized): Secondary | ICD-10-CM | POA: Diagnosis not present

## 2018-07-05 DIAGNOSIS — M6281 Muscle weakness (generalized): Secondary | ICD-10-CM | POA: Diagnosis not present

## 2018-07-05 DIAGNOSIS — R278 Other lack of coordination: Secondary | ICD-10-CM | POA: Diagnosis not present

## 2018-07-05 DIAGNOSIS — R488 Other symbolic dysfunctions: Secondary | ICD-10-CM | POA: Diagnosis not present

## 2018-07-05 DIAGNOSIS — R41841 Cognitive communication deficit: Secondary | ICD-10-CM | POA: Diagnosis not present

## 2018-07-05 DIAGNOSIS — R296 Repeated falls: Secondary | ICD-10-CM | POA: Diagnosis not present

## 2018-07-06 DIAGNOSIS — R296 Repeated falls: Secondary | ICD-10-CM | POA: Diagnosis not present

## 2018-07-06 DIAGNOSIS — R488 Other symbolic dysfunctions: Secondary | ICD-10-CM | POA: Diagnosis not present

## 2018-07-06 DIAGNOSIS — R41841 Cognitive communication deficit: Secondary | ICD-10-CM | POA: Diagnosis not present

## 2018-07-06 DIAGNOSIS — M6281 Muscle weakness (generalized): Secondary | ICD-10-CM | POA: Diagnosis not present

## 2018-07-06 DIAGNOSIS — R278 Other lack of coordination: Secondary | ICD-10-CM | POA: Diagnosis not present

## 2018-07-07 DIAGNOSIS — R41841 Cognitive communication deficit: Secondary | ICD-10-CM | POA: Diagnosis not present

## 2018-07-07 DIAGNOSIS — R488 Other symbolic dysfunctions: Secondary | ICD-10-CM | POA: Diagnosis not present

## 2018-07-07 DIAGNOSIS — M6281 Muscle weakness (generalized): Secondary | ICD-10-CM | POA: Diagnosis not present

## 2018-07-07 DIAGNOSIS — R278 Other lack of coordination: Secondary | ICD-10-CM | POA: Diagnosis not present

## 2018-07-07 DIAGNOSIS — R296 Repeated falls: Secondary | ICD-10-CM | POA: Diagnosis not present

## 2018-07-10 DIAGNOSIS — R296 Repeated falls: Secondary | ICD-10-CM | POA: Diagnosis not present

## 2018-07-10 DIAGNOSIS — M6281 Muscle weakness (generalized): Secondary | ICD-10-CM | POA: Diagnosis not present

## 2018-07-10 DIAGNOSIS — R278 Other lack of coordination: Secondary | ICD-10-CM | POA: Diagnosis not present

## 2018-07-10 DIAGNOSIS — R41841 Cognitive communication deficit: Secondary | ICD-10-CM | POA: Diagnosis not present

## 2018-07-10 DIAGNOSIS — R488 Other symbolic dysfunctions: Secondary | ICD-10-CM | POA: Diagnosis not present

## 2018-07-11 DIAGNOSIS — M6281 Muscle weakness (generalized): Secondary | ICD-10-CM | POA: Diagnosis not present

## 2018-07-11 DIAGNOSIS — R41841 Cognitive communication deficit: Secondary | ICD-10-CM | POA: Diagnosis not present

## 2018-07-11 DIAGNOSIS — R488 Other symbolic dysfunctions: Secondary | ICD-10-CM | POA: Diagnosis not present

## 2018-07-11 DIAGNOSIS — R278 Other lack of coordination: Secondary | ICD-10-CM | POA: Diagnosis not present

## 2018-07-11 DIAGNOSIS — R296 Repeated falls: Secondary | ICD-10-CM | POA: Diagnosis not present

## 2018-07-15 ENCOUNTER — Emergency Department: Payer: Medicare Other

## 2018-07-15 ENCOUNTER — Other Ambulatory Visit: Payer: Self-pay

## 2018-07-15 ENCOUNTER — Emergency Department
Admission: EM | Admit: 2018-07-15 | Discharge: 2018-07-15 | Disposition: A | Discharge status: Home / Self Care | Payer: Medicare Other | Attending: Emergency Medicine | Admitting: Emergency Medicine

## 2018-07-15 DIAGNOSIS — Y998 Other external cause status: Secondary | ICD-10-CM | POA: Diagnosis not present

## 2018-07-15 DIAGNOSIS — Z7901 Long term (current) use of anticoagulants: Secondary | ICD-10-CM | POA: Diagnosis not present

## 2018-07-15 DIAGNOSIS — Y9389 Activity, other specified: Secondary | ICD-10-CM | POA: Diagnosis not present

## 2018-07-15 DIAGNOSIS — M25552 Pain in left hip: Secondary | ICD-10-CM | POA: Diagnosis not present

## 2018-07-15 DIAGNOSIS — S7002XA Contusion of left hip, initial encounter: Secondary | ICD-10-CM

## 2018-07-15 DIAGNOSIS — N183 Chronic kidney disease, stage 3 (moderate): Secondary | ICD-10-CM | POA: Insufficient documentation

## 2018-07-15 DIAGNOSIS — I10 Essential (primary) hypertension: Secondary | ICD-10-CM | POA: Diagnosis not present

## 2018-07-15 DIAGNOSIS — W010XXA Fall on same level from slipping, tripping and stumbling without subsequent striking against object, initial encounter: Secondary | ICD-10-CM | POA: Diagnosis not present

## 2018-07-15 DIAGNOSIS — M25512 Pain in left shoulder: Secondary | ICD-10-CM | POA: Diagnosis not present

## 2018-07-15 DIAGNOSIS — S40012A Contusion of left shoulder, initial encounter: Secondary | ICD-10-CM | POA: Diagnosis not present

## 2018-07-15 DIAGNOSIS — S0001XA Abrasion of scalp, initial encounter: Secondary | ICD-10-CM | POA: Insufficient documentation

## 2018-07-15 DIAGNOSIS — Y92019 Unspecified place in single-family (private) house as the place of occurrence of the external cause: Secondary | ICD-10-CM | POA: Diagnosis not present

## 2018-07-15 DIAGNOSIS — S4992XA Unspecified injury of left shoulder and upper arm, initial encounter: Secondary | ICD-10-CM | POA: Diagnosis present

## 2018-07-15 DIAGNOSIS — W19XXXA Unspecified fall, initial encounter: Secondary | ICD-10-CM | POA: Diagnosis not present

## 2018-07-15 DIAGNOSIS — E039 Hypothyroidism, unspecified: Secondary | ICD-10-CM | POA: Diagnosis not present

## 2018-07-15 DIAGNOSIS — I129 Hypertensive chronic kidney disease with stage 1 through stage 4 chronic kidney disease, or unspecified chronic kidney disease: Secondary | ICD-10-CM | POA: Insufficient documentation

## 2018-07-15 DIAGNOSIS — Z79899 Other long term (current) drug therapy: Secondary | ICD-10-CM | POA: Insufficient documentation

## 2018-07-15 DIAGNOSIS — R58 Hemorrhage, not elsewhere classified: Secondary | ICD-10-CM | POA: Diagnosis not present

## 2018-07-15 DIAGNOSIS — S0990XA Unspecified injury of head, initial encounter: Secondary | ICD-10-CM | POA: Insufficient documentation

## 2018-07-15 DIAGNOSIS — I4891 Unspecified atrial fibrillation: Secondary | ICD-10-CM | POA: Diagnosis not present

## 2018-07-15 LAB — BASIC METABOLIC PANEL
Anion gap: 6 (ref 5–15)
BUN: 20 mg/dL (ref 8–23)
CO2: 28 mmol/L (ref 22–32)
Calcium: 8.9 mg/dL (ref 8.9–10.3)
Chloride: 105 mmol/L (ref 98–111)
Creatinine, Ser: 1.19 mg/dL — ABNORMAL HIGH (ref 0.44–1.00)
GFR calc Af Amer: 48 mL/min — ABNORMAL LOW (ref >60)
GFR calc non Af Amer: 41 mL/min — ABNORMAL LOW (ref >60)
Glucose, Bld: 103 mg/dL — ABNORMAL HIGH (ref 70–99)
Potassium: 3.6 mmol/L (ref 3.5–5.1)
Sodium: 139 mmol/L (ref 135–145)

## 2018-07-15 LAB — CBC WITH DIFFERENTIAL/PLATELET
Abs Immature Granulocytes: 0.02 10*3/uL (ref 0.00–0.07)
Basophils Absolute: 0.1 10*3/uL (ref 0.0–0.1)
Basophils Relative: 1 %
Eosinophils Absolute: 0.1 10*3/uL (ref 0.0–0.5)
Eosinophils Relative: 2 %
HCT: 34 % — ABNORMAL LOW (ref 36.0–46.0)
Hemoglobin: 11.3 g/dL — ABNORMAL LOW (ref 12.0–15.0)
Immature Granulocytes: 0 %
Lymphocytes Relative: 16 %
Lymphs Abs: 1 10*3/uL (ref 0.7–4.0)
MCH: 30.6 pg (ref 26.0–34.0)
MCHC: 33.2 g/dL (ref 30.0–36.0)
MCV: 92.1 fL (ref 80.0–100.0)
Monocytes Absolute: 0.6 10*3/uL (ref 0.1–1.0)
Monocytes Relative: 9 %
Neutro Abs: 4.6 10*3/uL (ref 1.7–7.7)
Neutrophils Relative %: 72 %
Platelets: 146 10*3/uL — ABNORMAL LOW (ref 150–400)
RBC: 3.69 MIL/uL — ABNORMAL LOW (ref 3.87–5.11)
RDW: 12.4 % (ref 11.5–15.5)
WBC: 6.5 10*3/uL (ref 4.0–10.5)
nRBC: 0 % (ref 0.0–0.2)

## 2018-07-15 LAB — URINALYSIS, COMPLETE (UACMP) WITH MICROSCOPIC
Bacteria, UA: NONE SEEN
Bilirubin Urine: NEGATIVE
Glucose, UA: NEGATIVE mg/dL
Ketones, ur: NEGATIVE mg/dL
Nitrite: NEGATIVE
Protein, ur: NEGATIVE mg/dL
Specific Gravity, Urine: 1.015 (ref 1.005–1.030)
pH: 6 (ref 5.0–8.0)

## 2018-07-15 LAB — CK: Total CK: 52 U/L (ref 38–234)

## 2018-07-15 MED ORDER — ACETAMINOPHEN 325 MG PO TABS
650.0000 mg | ORAL_TABLET | Freq: Once | ORAL | Status: AC
  Administered 2018-07-15: 325 mg via ORAL

## 2018-07-15 MED ORDER — ACETAMINOPHEN 325 MG PO TABS
ORAL_TABLET | ORAL | Status: AC
  Administered 2018-07-15: 325 mg via ORAL
  Filled 2018-07-15: qty 2

## 2018-07-15 NOTE — ED Notes (Signed)
Return from [place].  AAOx3.  Skin warm and dry. NAD

## 2018-07-15 NOTE — ED Triage Notes (Signed)
Patient resides at [facility]  fell last night  found on floor  In the am by an aide  no LOC upon fall. c/o right sided hip pain   laceration to top of head  on Zerelto  HR 78 95% 168/77 has 8 screws in back

## 2018-07-15 NOTE — ED Notes (Signed)
Cleaned patient of dried blood from hair and hands and body.

## 2018-07-15 NOTE — ED Notes (Signed)
Patient AA0x3 at baseline. Vitals stable. NAD

## 2018-07-15 NOTE — ED Provider Notes (Signed)
[facility] Emergency Department Provider Note ____________________________________________   First MD Initiated Contact with Patient 07/15/18 0809     (approximate)  I have reviewed the triage vital signs and the nursing notes.   HISTORY  Chief Complaint Fall    HPI MYLI PAE is a 85 y.o. female who presents for evaluation after a fall last night from standing height.  The patient states that it happened when she tripped or lost her footing and that she was not feeling dizzy or weak before.  She states she did not lose consciousness.  She attempted to get help but was unable to get up and spent the night on the floor.  She was found by an aide this morning.  The patient reports some bleeding from her scalp and mild pain to the left shoulder and left hip.  She denies other acute injury.  Past Medical History:  Diagnosis Date  . Anemia    r/t uterine fibroids  . Atrial fib/flutter, transient    [facility] 03/2009  . DDD (degenerative disc disease)   . Dysrhythmia    A-FIB  . GERD (gastroesophageal reflux disease)   . H/O bladder infections   . H/O hiatal hernia   . H/O thyroid nodule    surgically removed  . Heart murmur    moderate MR/TR 08/2013 echo [name])  . Hemorrhoids   . Hiatal hernia with gastroesophageal reflux 2008   by EGD,  [name]  . High cholesterol   . History of uterine fibroid   . Hydronephrosis    uretero- with congenital UPJ obstruction- Dr. Edrick Oh  . Hydronephrosis with ureteropelvic junction obstruction   . Hypertension    [facility]  . Hypothyroidism   . Nocturia   . Osteoporosis   . Polyuria   . Recurrent UTI     Patient Active Problem List   Diagnosis Date Noted  . GAD (generalized anxiety disorder) 02/04/2018  . Venous insufficiency (chronic) (peripheral) 02/04/2018  . CKD (chronic kidney disease) stage 3, GFR 30-59 ml/min (HCC) 09/18/2017  . Moderate dementia with behavioral  disturbance ([place]) 06/14/2017  . Essential hypertension 06/14/2017  . B12 deficiency 09/18/2016  . Anemia 08/05/2015  . Vitamin D deficiency 08/05/2015  . Constipation 02/04/2015  . Left knee pain 01/05/2015  . Status post laminectomy with spinal fusion 02/23/2014  . Excessive body weight loss 02/23/2014  . Chronic lower back pain 08/12/2012  . Urge urinary incontinence 08/12/2012  . Personal history of colonic polyps 08/12/2012  . Seasonal rhinitis 01/16/2012  . Hiatal hernia with gastroesophageal reflux   . Fatigue 09/16/2011  . Controlled atrial fibrillation ([place]) 12/15/2010  . Irritable bowel syndrome without diarrhea 12/15/2010  . Hypothyroid 12/15/2010  . Long term current use of anticoagulant therapy 12/15/2010    Past Surgical History:  Procedure Laterality Date  . ADENOIDECTOMY N/A 01/05/2016   Procedure: ADENOIDECTOMY;  Surgeon: Margaretha Sheffield, MD;  Location: [facility] ORS;  Service: ENT;  Laterality: N/A;  . BACK SURGERY    . CATARACT EXTRACTION     left  . CERVICAL DISC SURGERY  1980's  . CHOLECYSTECTOMY    . NASOPHARYNGOSCOPY  01/05/2016   Procedure: NASOPHARYNGOSCOPY;  Surgeon: Margaretha Sheffield, MD;  Location: [facility] ORS;  Service: ENT;;  . THYROID LOBECTOMY     partial, right lobe  . TOTAL ABDOMINAL HYSTERECTOMY W/ BILATERAL SALPINGOOPHORECTOMY      Prior to Admission medications   Medication Sig Start Date End Date Taking? Authorizing  Provider  escitalopram (LEXAPRO) 5 MG tablet TAKE 1 TABLET BY MOUTH DAILY 06/26/18  Yes Crecencio Mc, MD  esomeprazole (NEXIUM) 40 MG capsule TAKE 1 CAPSULE BY MOUTH TWICE DAILY BEFORE MEALS 05/04/18  Yes Crecencio Mc, MD  furosemide (LASIX) 20 MG tablet Take 20 mg by mouth daily.  06/16/16  Yes [provider]  levothyroxine (SYNTHROID, LEVOTHROID) 50 MCG tablet TAKE 1 TABLET BY MOUTH DAILY 04/04/18  Yes Crecencio Mc, MD  losartan (COZAAR) 100 MG tablet TAKE ONE TABLET BY MOUTH EVERY DAY 02/13/18  Yes Crecencio Mc, MD   metoprolol tartrate (LOPRESSOR) 100 MG tablet TAKE ONE TABLET BY MOUTH TWICE DAILY 06/26/18  Yes Crecencio Mc, MD  pravastatin (PRAVACHOL) 20 MG tablet Take 20 mg by mouth daily. 07/10/18  Yes [provider]  Rivaroxaban (XARELTO) 15 MG TABS tablet Take 15 mg by mouth daily with supper.   Yes [provider]  rivastigmine (EXELON) 3 MG capsule Take 1 capsule (3 mg total) by mouth 2 (two) times daily. 04/17/18  Yes Cameron Sprang, MD  acetaminophen (TYLENOL) 500 MG tablet Take 500 mg by mouth every 6 (six) hours as needed for headache.    [provider]  ondansetron (ZOFRAN) 4 MG tablet TAKE 1 TABLET BY MOUTH EVERY 8 HOURS AS NEEDED FOR NAUSEA OR VOMITING 06/20/17   Crecencio Mc, MD  polyethylene glycol powder (GLYCOLAX/MIRALAX) powder Take 17 g by mouth daily as needed for mild constipation. 01/02/15   Leone Haven, MD  Simethicone (EQ GAS RELIEF) 125 MG CAPS Take 125 mg by mouth daily as needed (gas). Reported on 07/01/2015    [provider]  traMADol (ULTRAM) 50 MG tablet Take 1 tablet (50 mg total) by mouth every 8 (eight) hours as needed. 08/16/17   Crecencio Mc, MD    Allergies Celebrex [celecoxib]; Donepezil; Loratadine-pseudoephedrine er; Venlafaxine; Vioxx [rofecoxib]; Codeine; Dexilant [dexlansoprazole]; and Eliquis [apixaban]  Family History  Problem Relation Age of Onset  . Heart disease Mother   . Coronary artery disease Father        MI's    Social History Social History   Tobacco Use  . Smoking status: Never Smoker  . Smokeless tobacco: Never Used  Substance Use Topics  . Alcohol use: No  . Drug use: No    Review of Systems  Constitutional: No fever. Eyes: No redness. ENT: No neck pain. Cardiovascular: Denies chest pain. Respiratory: Denies shortness of breath. Gastrointestinal: No vomiting or diarrhea.  Genitourinary: Negative for flank pain.  Musculoskeletal: Negative for back pain.  Positive for left shoulder  and left hip pain. Skin: Negative for rash. Neurological: Negative for headache.   ____________________________________________   PHYSICAL EXAM:  VITAL SIGNS: ED Triage Vitals  Enc Vitals Group     BP 07/15/18 0754 119/77     Pulse Rate 07/15/18 0754 67     Resp 07/15/18 0754 16     Temp 07/15/18 0754 98.2 F (36.8 C)     Temp Source 07/15/18 0754 Oral     SpO2 07/15/18 0754 97 %     Weight 07/15/18 0751 175 lb (79.4 kg)     Height 07/15/18 0751 5\' 4"  (1.626 m)     Head Circumference --      Peak Flow --      Pain Score 07/15/18 0755 0     Pain Loc --      Pain Edu? --      Excl.  in [place]? --     Constitutional: Alert and oriented.  Relatively well appearing and in no acute distress. Eyes: Conjunctivae are normal.  EOMI.  PERRLA. Head: 2 cm superficial abrasion to the top of the scalp with some dried blood. Nose: No congestion/rhinnorhea. Mouth/Throat: Mucous membranes are moist.   Neck: Normal range of motion.  No midline cervical spinal tenderness. Cardiovascular: Normal rate, regular rhythm. Grossly normal heart sounds.  Good peripheral circulation. Respiratory: Normal respiratory effort.  No retractions. Lungs CTAB. Gastrointestinal: Soft and nontender. No distention.  Genitourinary: No flank tenderness. Musculoskeletal: No lower extremity edema.  Extremities warm and well perfused.  Mild pain on range of motion of left shoulder and left hip.  Intact distal pulses in all extremities. Neurologic:  Normal speech and language.  Motor and sensory intact in all extremities.  Normal coordination.  No gross focal neurologic deficits are appreciated.  Skin:  Skin is warm and dry. No rash noted. Psychiatric: Mood and affect are normal. Speech and behavior are normal.  ____________________________________________   LABS (all labs ordered are listed, but only abnormal results are displayed)  Labs Reviewed  BASIC METABOLIC PANEL - Abnormal; Notable for the following  components:      Result Value   Glucose, Bld 103 (*)    Creatinine, Ser 1.19 (*)    GFR calc non Af Amer 41 (*)    GFR calc Af Amer 48 (*)    All other components within normal limits  CBC WITH DIFFERENTIAL/PLATELET - Abnormal; Notable for the following components:   RBC 3.69 (*)    Hemoglobin 11.3 (*)    HCT 34.0 (*)    Platelets 146 (*)    All other components within normal limits  URINALYSIS, COMPLETE (UACMP) WITH MICROSCOPIC - Abnormal; Notable for the following components:   Color, Urine YELLOW (*)    APPearance CLEAR (*)    Hgb urine dipstick SMALL (*)    Leukocytes,Ua TRACE (*)    All other components within normal limits  CK   ____________________________________________  EKG  ED ECG REPORT I, Arta Silence, the attending physician, personally viewed and interpreted this ECG.  Date: 07/15/2018 EKG Time: 802 Rate: 78 Rhythm: Atrial fibrillation QRS Axis: normal Intervals: normal ST/T Wave abnormalities: normal Narrative Interpretation: no evidence of acute ischemia  ____________________________________________  RADIOLOGY  CT head: No ICH CT cervical spine: No acute fracture XR L shoulder: No acute fracture XR L hip: No acute fracture  ____________________________________________   PROCEDURES  Procedure(s) performed: No  Procedures  Critical Care performed: No ____________________________________________   INITIAL IMPRESSION / ASSESSMENT AND PLAN / ED COURSE  Pertinent labs & imaging results that were available during my care of the patient were reviewed by me and considered in my medical decision making (see chart for details).  85 year old female with PMH as noted above presents after a fall sometime yesterday evening.  The patient describes a most likely mechanical fall when she tripped or lost her footing, but she was unable to get up or get any assistance until this morning.  On exam, the patient is relatively well-appearing and her  vital signs are normal.  She has an abrasion to her scalp with some dried blood, but no laceration.  She has no midline spinal tenderness.  There is some pain to the left shoulder and left hip but normal range of motion of both joints.  The remainder of the exam is as described above.  Overall I suspect most likely mechanical fall.  Given her time on the ground I will obtain basic labs and a CK Cherrelle rhabdomyolysis.  We will also obtain a UA and has a UTI could cause weakness and possibly precipitate the fall.  We will obtain CT of the head and C-spine and x-rays of the left shoulder and left hip.  ----------------------------------------- 11:02 AM on 07/15/2018 -----------------------------------------  Imaging is negative for acute traumatic findings.  The lab work-up is unremarkable.  The patient is stable for discharge back to her facility.    I discussed the results of the work-up with the patient.  Return precautions given, and she expressed understanding. ____________________________________________   FINAL CLINICAL IMPRESSION(S) / ED DIAGNOSES  Final diagnoses:  Contusion of left shoulder, initial encounter  Contusion of left hip, initial encounter  Abrasion of scalp, initial encounter      NEW MEDICATIONS STARTED DURING THIS VISIT:  New Prescriptions   No medications on file     Note:  This document was prepared using Dragon voice recognition software and may include unintentional dictation errors.   Arta Silence, MD 07/15/18 1102

## 2018-07-15 NOTE — ED Triage Notes (Signed)
Patient denies LOC, but is unable to describe fall.  Patient lives in [place] and was found on the floor this morning by a CNA.  Laceration to top of head, bleeding controlled.  Also skin tear noted to left shoulder.    Patient is AAOx3.  Skin warm and dry .Marland Kitchen NAD

## 2018-07-15 NOTE — Discharge Instructions (Addendum)
Return to the ER for new or worsening pain, weakness, recurrent falls, or any other new or worsening symptoms that concern you.

## 2018-07-17 DIAGNOSIS — M6281 Muscle weakness (generalized): Secondary | ICD-10-CM | POA: Diagnosis not present

## 2018-07-17 DIAGNOSIS — R296 Repeated falls: Secondary | ICD-10-CM | POA: Diagnosis not present

## 2018-07-17 DIAGNOSIS — R278 Other lack of coordination: Secondary | ICD-10-CM | POA: Diagnosis not present

## 2018-07-17 DIAGNOSIS — R488 Other symbolic dysfunctions: Secondary | ICD-10-CM | POA: Diagnosis not present

## 2018-07-17 DIAGNOSIS — R41841 Cognitive communication deficit: Secondary | ICD-10-CM | POA: Diagnosis not present

## 2018-08-08 ENCOUNTER — Ambulatory Visit: Payer: Medicare Other | Admitting: Internal Medicine

## 2018-09-18 ENCOUNTER — Other Ambulatory Visit: Payer: Self-pay

## 2018-09-19 ENCOUNTER — Ambulatory Visit (INDEPENDENT_AMBULATORY_CARE_PROVIDER_SITE_OTHER): Payer: Medicare Other | Admitting: Internal Medicine

## 2018-09-19 ENCOUNTER — Encounter: Payer: Self-pay | Admitting: Internal Medicine

## 2018-09-19 VITALS — BP 112/64 | HR 82 | Temp 97.9°F | Resp 15 | Ht 64.0 in | Wt 133.8 lb

## 2018-09-19 DIAGNOSIS — F0391 Unspecified dementia with behavioral disturbance: Secondary | ICD-10-CM | POA: Diagnosis not present

## 2018-09-19 DIAGNOSIS — E559 Vitamin D deficiency, unspecified: Secondary | ICD-10-CM | POA: Diagnosis not present

## 2018-09-19 DIAGNOSIS — R634 Abnormal weight loss: Secondary | ICD-10-CM

## 2018-09-19 DIAGNOSIS — R3981 Functional urinary incontinence: Secondary | ICD-10-CM | POA: Diagnosis not present

## 2018-09-19 DIAGNOSIS — N3941 Urge incontinence: Secondary | ICD-10-CM

## 2018-09-19 DIAGNOSIS — F0390 Unspecified dementia without behavioral disturbance: Secondary | ICD-10-CM

## 2018-09-19 DIAGNOSIS — R443 Hallucinations, unspecified: Secondary | ICD-10-CM | POA: Diagnosis not present

## 2018-09-19 DIAGNOSIS — F03B18 Unspecified dementia, moderate, with other behavioral disturbance: Secondary | ICD-10-CM

## 2018-09-19 DIAGNOSIS — F0392 Unspecified dementia, unspecified severity, with psychotic disturbance: Secondary | ICD-10-CM

## 2018-09-19 DIAGNOSIS — R5383 Other fatigue: Secondary | ICD-10-CM

## 2018-09-19 DIAGNOSIS — Z79899 Other long term (current) drug therapy: Secondary | ICD-10-CM | POA: Diagnosis not present

## 2018-09-19 DIAGNOSIS — E039 Hypothyroidism, unspecified: Secondary | ICD-10-CM | POA: Diagnosis not present

## 2018-09-19 DIAGNOSIS — Z9181 History of falling: Secondary | ICD-10-CM

## 2018-09-19 NOTE — Patient Instructions (Addendum)
I recommend that you stop taking your blood thinner,  Xarelto ,  Because of your recent fall  You can suspend the furosemide to see if the bladder leaks less without it.   These changes can happen at the end of the month to avoid wasting a whole month of the pill paks
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
## 2018-09-20 DIAGNOSIS — F0390 Unspecified dementia without behavioral disturbance: Secondary | ICD-10-CM | POA: Insufficient documentation

## 2018-09-20 DIAGNOSIS — F0392 Unspecified dementia, unspecified severity, with psychotic disturbance: Secondary | ICD-10-CM | POA: Insufficient documentation

## 2018-09-20 DIAGNOSIS — Z9181 History of falling: Secondary | ICD-10-CM | POA: Insufficient documentation

## 2018-09-20 LAB — URINALYSIS, ROUTINE W REFLEX MICROSCOPIC
Bilirubin Urine: NEGATIVE
Hgb urine dipstick: NEGATIVE
Ketones, ur: NEGATIVE
Leukocytes,Ua: NEGATIVE
Nitrite: NEGATIVE
RBC / HPF: NONE SEEN (ref 0–?)
Specific Gravity, Urine: 1.02 (ref 1.000–1.030)
Total Protein, Urine: NEGATIVE
Urine Glucose: NEGATIVE
Urobilinogen, UA: 0.2 (ref 0.0–1.0)
pH: 6.5 (ref 5.0–8.0)

## 2018-09-20 LAB — CBC WITH DIFFERENTIAL/PLATELET
Basophils Absolute: 0.1 10*3/uL (ref 0.0–0.1)
Basophils Relative: 1.2 % (ref 0.0–3.0)
Eosinophils Absolute: 0.1 10*3/uL (ref 0.0–0.7)
Eosinophils Relative: 1.8 % (ref 0.0–5.0)
HCT: 32.6 % — ABNORMAL LOW (ref 36.0–46.0)
Hemoglobin: 11.2 g/dL — ABNORMAL LOW (ref 12.0–15.0)
Lymphocytes Relative: 24.3 % (ref 12.0–46.0)
Lymphs Abs: 1.5 10*3/uL (ref 0.7–4.0)
MCHC: 34.5 g/dL (ref 30.0–36.0)
MCV: 90.7 fl (ref 78.0–100.0)
Monocytes Absolute: 0.6 10*3/uL (ref 0.1–1.0)
Monocytes Relative: 10.6 % (ref 3.0–12.0)
Neutro Abs: 3.8 10*3/uL (ref 1.4–7.7)
Neutrophils Relative %: 62.1 % (ref 43.0–77.0)
Platelets: 146 10*3/uL — ABNORMAL LOW (ref 150.0–400.0)
RBC: 3.59 Mil/uL — ABNORMAL LOW (ref 3.87–5.11)
RDW: 12.9 % (ref 11.5–15.5)
WBC: 6.1 10*3/uL (ref 4.0–10.5)

## 2018-09-20 LAB — VITAMIN D 25 HYDROXY (VIT D DEFICIENCY, FRACTURES): VITD: 44.37 ng/mL (ref 30.00–100.00)

## 2018-09-20 LAB — TSH: TSH: 1.68 u[IU]/mL (ref 0.35–4.50)

## 2018-09-20 LAB — COMPREHENSIVE METABOLIC PANEL
ALT: 7 U/L (ref 0–35)
AST: 14 U/L (ref 0–37)
Albumin: 3.7 g/dL (ref 3.5–5.2)
Alkaline Phosphatase: 67 U/L (ref 39–117)
BUN: 17 mg/dL (ref 6–23)
CO2: 30 mEq/L (ref 19–32)
Calcium: 9 mg/dL (ref 8.4–10.5)
Chloride: 102 mEq/L (ref 96–112)
Creatinine, Ser: 1.27 mg/dL — ABNORMAL HIGH (ref 0.40–1.20)
GFR: 39.86 mL/min — ABNORMAL LOW (ref >60.00)
Glucose, Bld: 92 mg/dL (ref 70–99)
Potassium: 4.5 mEq/L (ref 3.5–5.1)
Sodium: 138 mEq/L (ref 135–145)
Total Bilirubin: 0.6 mg/dL (ref 0.2–1.2)
Total Protein: 6.1 g/dL (ref 6.0–8.3)

## 2018-09-20 NOTE — Assessment & Plan Note (Signed)
   Lab Results  Component Value Date   TSH 1.68 09/19/2018   Thyroid function is WNL on current dose.  No current changes needed.

## 2018-09-20 NOTE — Assessment & Plan Note (Signed)
Given her normal EF ,  Recommend suspending daily furosemide,  Rule out UTi and consider trial of medication for OAB.

## 2018-09-20 NOTE — Assessment & Plan Note (Signed)
She had an unwitnessed fall in March and a prior fall that resulted in blunt head trauma.  I am strongly recommending that she discontinue Xarelto

## 2018-09-20 NOTE — Assessment & Plan Note (Signed)
Resolved with transition to assisted living with meals provided  She has gained 4 lbs since October 2019

## 2018-09-20 NOTE — Assessment & Plan Note (Signed)
No evidence of UTI by UA done today.  Will consider suspending lexapro

## 2018-09-20 NOTE — Assessment & Plan Note (Signed)
She is now taking aricept and Exelon .  She is in assisted living

## 2018-09-21 LAB — URINE CULTURE
MICRO NUMBER:: 528563
Result:: NO GROWTH
SPECIMEN QUALITY:: ADEQUATE

## 2018-09-22 DIAGNOSIS — N2581 Secondary hyperparathyroidism of renal origin: Secondary | ICD-10-CM | POA: Diagnosis not present

## 2018-09-22 DIAGNOSIS — D631 Anemia in chronic kidney disease: Secondary | ICD-10-CM | POA: Diagnosis not present

## 2018-09-22 DIAGNOSIS — N183 Chronic kidney disease, stage 3 (moderate): Secondary | ICD-10-CM | POA: Diagnosis not present

## 2018-09-22 DIAGNOSIS — R809 Proteinuria, unspecified: Secondary | ICD-10-CM | POA: Diagnosis not present

## 2018-09-22 DIAGNOSIS — I10 Essential (primary) hypertension: Secondary | ICD-10-CM | POA: Diagnosis not present

## 2018-09-22 DIAGNOSIS — R600 Localized edema: Secondary | ICD-10-CM | POA: Diagnosis not present

## 2018-09-30 ENCOUNTER — Other Ambulatory Visit: Payer: Self-pay | Admitting: Internal Medicine

## 2018-10-30 DIAGNOSIS — M6281 Muscle weakness (generalized): Secondary | ICD-10-CM | POA: Diagnosis not present

## 2018-10-30 DIAGNOSIS — R4189 Other symptoms and signs involving cognitive functions and awareness: Secondary | ICD-10-CM | POA: Diagnosis not present

## 2018-10-30 DIAGNOSIS — R279 Unspecified lack of coordination: Secondary | ICD-10-CM | POA: Diagnosis not present

## 2018-10-30 DIAGNOSIS — R488 Other symbolic dysfunctions: Secondary | ICD-10-CM | POA: Diagnosis not present

## 2018-10-31 DIAGNOSIS — M6281 Muscle weakness (generalized): Secondary | ICD-10-CM | POA: Diagnosis not present

## 2018-10-31 DIAGNOSIS — R488 Other symbolic dysfunctions: Secondary | ICD-10-CM | POA: Diagnosis not present

## 2018-10-31 DIAGNOSIS — R279 Unspecified lack of coordination: Secondary | ICD-10-CM | POA: Diagnosis not present

## 2018-10-31 DIAGNOSIS — R4189 Other symptoms and signs involving cognitive functions and awareness: Secondary | ICD-10-CM | POA: Diagnosis not present

## 2018-11-01 DIAGNOSIS — R4189 Other symptoms and signs involving cognitive functions and awareness: Secondary | ICD-10-CM | POA: Diagnosis not present

## 2018-11-01 DIAGNOSIS — R488 Other symbolic dysfunctions: Secondary | ICD-10-CM | POA: Diagnosis not present

## 2018-11-01 DIAGNOSIS — R279 Unspecified lack of coordination: Secondary | ICD-10-CM | POA: Diagnosis not present

## 2018-11-01 DIAGNOSIS — M6281 Muscle weakness (generalized): Secondary | ICD-10-CM | POA: Diagnosis not present

## 2018-11-06 DIAGNOSIS — R279 Unspecified lack of coordination: Secondary | ICD-10-CM | POA: Diagnosis not present

## 2018-11-06 DIAGNOSIS — M6281 Muscle weakness (generalized): Secondary | ICD-10-CM | POA: Diagnosis not present

## 2018-11-06 DIAGNOSIS — R4189 Other symptoms and signs involving cognitive functions and awareness: Secondary | ICD-10-CM | POA: Diagnosis not present

## 2018-11-06 DIAGNOSIS — R488 Other symbolic dysfunctions: Secondary | ICD-10-CM | POA: Diagnosis not present

## 2018-11-07 DIAGNOSIS — M6281 Muscle weakness (generalized): Secondary | ICD-10-CM | POA: Diagnosis not present

## 2018-11-07 DIAGNOSIS — R488 Other symbolic dysfunctions: Secondary | ICD-10-CM | POA: Diagnosis not present

## 2018-11-07 DIAGNOSIS — R4189 Other symptoms and signs involving cognitive functions and awareness: Secondary | ICD-10-CM | POA: Diagnosis not present

## 2018-11-07 DIAGNOSIS — R279 Unspecified lack of coordination: Secondary | ICD-10-CM | POA: Diagnosis not present

## 2018-11-08 DIAGNOSIS — M6281 Muscle weakness (generalized): Secondary | ICD-10-CM | POA: Diagnosis not present

## 2018-11-08 DIAGNOSIS — R279 Unspecified lack of coordination: Secondary | ICD-10-CM | POA: Diagnosis not present

## 2018-11-08 DIAGNOSIS — R488 Other symbolic dysfunctions: Secondary | ICD-10-CM | POA: Diagnosis not present

## 2018-11-08 DIAGNOSIS — R4189 Other symptoms and signs involving cognitive functions and awareness: Secondary | ICD-10-CM | POA: Diagnosis not present

## 2018-11-09 DIAGNOSIS — E782 Mixed hyperlipidemia: Secondary | ICD-10-CM | POA: Diagnosis not present

## 2018-11-09 DIAGNOSIS — I482 Chronic atrial fibrillation, unspecified: Secondary | ICD-10-CM | POA: Diagnosis not present

## 2018-11-09 DIAGNOSIS — I38 Endocarditis, valve unspecified: Secondary | ICD-10-CM | POA: Diagnosis not present

## 2018-11-09 DIAGNOSIS — I872 Venous insufficiency (chronic) (peripheral): Secondary | ICD-10-CM | POA: Diagnosis not present

## 2018-11-09 DIAGNOSIS — I10 Essential (primary) hypertension: Secondary | ICD-10-CM | POA: Diagnosis not present

## 2018-11-13 DIAGNOSIS — R488 Other symbolic dysfunctions: Secondary | ICD-10-CM | POA: Diagnosis not present

## 2018-11-13 DIAGNOSIS — R279 Unspecified lack of coordination: Secondary | ICD-10-CM | POA: Diagnosis not present

## 2018-11-13 DIAGNOSIS — R4189 Other symptoms and signs involving cognitive functions and awareness: Secondary | ICD-10-CM | POA: Diagnosis not present

## 2018-11-13 DIAGNOSIS — M6281 Muscle weakness (generalized): Secondary | ICD-10-CM | POA: Diagnosis not present

## 2018-11-14 DIAGNOSIS — M6281 Muscle weakness (generalized): Secondary | ICD-10-CM | POA: Diagnosis not present

## 2018-11-14 DIAGNOSIS — R488 Other symbolic dysfunctions: Secondary | ICD-10-CM | POA: Diagnosis not present

## 2018-11-14 DIAGNOSIS — R4189 Other symptoms and signs involving cognitive functions and awareness: Secondary | ICD-10-CM | POA: Diagnosis not present

## 2018-11-14 DIAGNOSIS — R279 Unspecified lack of coordination: Secondary | ICD-10-CM | POA: Diagnosis not present

## 2018-11-15 DIAGNOSIS — M6281 Muscle weakness (generalized): Secondary | ICD-10-CM | POA: Diagnosis not present

## 2018-11-15 DIAGNOSIS — R279 Unspecified lack of coordination: Secondary | ICD-10-CM | POA: Diagnosis not present

## 2018-11-15 DIAGNOSIS — R4189 Other symptoms and signs involving cognitive functions and awareness: Secondary | ICD-10-CM | POA: Diagnosis not present

## 2018-11-15 DIAGNOSIS — R488 Other symbolic dysfunctions: Secondary | ICD-10-CM | POA: Diagnosis not present

## 2018-11-17 ENCOUNTER — Ambulatory Visit: Payer: Medicare Other | Admitting: Neurology

## 2018-11-17 DIAGNOSIS — I10 Essential (primary) hypertension: Secondary | ICD-10-CM | POA: Diagnosis not present

## 2018-11-17 DIAGNOSIS — M546 Pain in thoracic spine: Secondary | ICD-10-CM | POA: Diagnosis not present

## 2018-11-17 DIAGNOSIS — Z981 Arthrodesis status: Secondary | ICD-10-CM | POA: Diagnosis not present

## 2018-11-17 DIAGNOSIS — M47816 Spondylosis without myelopathy or radiculopathy, lumbar region: Secondary | ICD-10-CM | POA: Diagnosis not present

## 2018-11-17 DIAGNOSIS — M5442 Lumbago with sciatica, left side: Secondary | ICD-10-CM | POA: Diagnosis not present

## 2018-11-17 DIAGNOSIS — M5136 Other intervertebral disc degeneration, lumbar region: Secondary | ICD-10-CM | POA: Diagnosis not present

## 2018-11-20 ENCOUNTER — Ambulatory Visit: Payer: Medicare Other | Admitting: Neurology

## 2018-11-21 DIAGNOSIS — R279 Unspecified lack of coordination: Secondary | ICD-10-CM | POA: Diagnosis not present

## 2018-11-21 DIAGNOSIS — R4189 Other symptoms and signs involving cognitive functions and awareness: Secondary | ICD-10-CM | POA: Diagnosis not present

## 2018-11-21 DIAGNOSIS — M6281 Muscle weakness (generalized): Secondary | ICD-10-CM | POA: Diagnosis not present

## 2018-11-21 DIAGNOSIS — R488 Other symbolic dysfunctions: Secondary | ICD-10-CM | POA: Diagnosis not present

## 2018-11-22 DIAGNOSIS — R279 Unspecified lack of coordination: Secondary | ICD-10-CM | POA: Diagnosis not present

## 2018-11-22 DIAGNOSIS — M6281 Muscle weakness (generalized): Secondary | ICD-10-CM | POA: Diagnosis not present

## 2018-11-22 DIAGNOSIS — R488 Other symbolic dysfunctions: Secondary | ICD-10-CM | POA: Diagnosis not present

## 2018-11-22 DIAGNOSIS — R4189 Other symptoms and signs involving cognitive functions and awareness: Secondary | ICD-10-CM | POA: Diagnosis not present

## 2018-11-24 DIAGNOSIS — M6281 Muscle weakness (generalized): Secondary | ICD-10-CM | POA: Diagnosis not present

## 2018-11-24 DIAGNOSIS — R279 Unspecified lack of coordination: Secondary | ICD-10-CM | POA: Diagnosis not present

## 2018-11-24 DIAGNOSIS — R488 Other symbolic dysfunctions: Secondary | ICD-10-CM | POA: Diagnosis not present

## 2018-11-24 DIAGNOSIS — R4189 Other symptoms and signs involving cognitive functions and awareness: Secondary | ICD-10-CM | POA: Diagnosis not present

## 2018-11-27 DIAGNOSIS — R279 Unspecified lack of coordination: Secondary | ICD-10-CM | POA: Diagnosis not present

## 2018-11-27 DIAGNOSIS — R4189 Other symptoms and signs involving cognitive functions and awareness: Secondary | ICD-10-CM | POA: Diagnosis not present

## 2018-11-27 DIAGNOSIS — R488 Other symbolic dysfunctions: Secondary | ICD-10-CM | POA: Diagnosis not present

## 2018-11-27 DIAGNOSIS — M6281 Muscle weakness (generalized): Secondary | ICD-10-CM | POA: Diagnosis not present

## 2018-11-28 DIAGNOSIS — R279 Unspecified lack of coordination: Secondary | ICD-10-CM | POA: Diagnosis not present

## 2018-11-28 DIAGNOSIS — R488 Other symbolic dysfunctions: Secondary | ICD-10-CM | POA: Diagnosis not present

## 2018-11-28 DIAGNOSIS — M6281 Muscle weakness (generalized): Secondary | ICD-10-CM | POA: Diagnosis not present

## 2018-11-28 DIAGNOSIS — R4189 Other symptoms and signs involving cognitive functions and awareness: Secondary | ICD-10-CM | POA: Diagnosis not present

## 2018-12-01 DIAGNOSIS — R4189 Other symptoms and signs involving cognitive functions and awareness: Secondary | ICD-10-CM | POA: Diagnosis not present

## 2018-12-01 DIAGNOSIS — M6281 Muscle weakness (generalized): Secondary | ICD-10-CM | POA: Diagnosis not present

## 2018-12-01 DIAGNOSIS — R488 Other symbolic dysfunctions: Secondary | ICD-10-CM | POA: Diagnosis not present

## 2018-12-01 DIAGNOSIS — R279 Unspecified lack of coordination: Secondary | ICD-10-CM | POA: Diagnosis not present

## 2018-12-04 DIAGNOSIS — M6281 Muscle weakness (generalized): Secondary | ICD-10-CM | POA: Diagnosis not present

## 2018-12-04 DIAGNOSIS — R4189 Other symptoms and signs involving cognitive functions and awareness: Secondary | ICD-10-CM | POA: Diagnosis not present

## 2018-12-04 DIAGNOSIS — R279 Unspecified lack of coordination: Secondary | ICD-10-CM | POA: Diagnosis not present

## 2018-12-04 DIAGNOSIS — R488 Other symbolic dysfunctions: Secondary | ICD-10-CM | POA: Diagnosis not present

## 2018-12-04 DIAGNOSIS — M47816 Spondylosis without myelopathy or radiculopathy, lumbar region: Secondary | ICD-10-CM | POA: Diagnosis not present

## 2018-12-07 DIAGNOSIS — R4189 Other symptoms and signs involving cognitive functions and awareness: Secondary | ICD-10-CM | POA: Diagnosis not present

## 2018-12-07 DIAGNOSIS — R488 Other symbolic dysfunctions: Secondary | ICD-10-CM | POA: Diagnosis not present

## 2018-12-07 DIAGNOSIS — M6281 Muscle weakness (generalized): Secondary | ICD-10-CM | POA: Diagnosis not present

## 2018-12-07 DIAGNOSIS — R279 Unspecified lack of coordination: Secondary | ICD-10-CM | POA: Diagnosis not present

## 2018-12-08 DIAGNOSIS — R4189 Other symptoms and signs involving cognitive functions and awareness: Secondary | ICD-10-CM | POA: Diagnosis not present

## 2018-12-08 DIAGNOSIS — M6281 Muscle weakness (generalized): Secondary | ICD-10-CM | POA: Diagnosis not present

## 2018-12-08 DIAGNOSIS — R488 Other symbolic dysfunctions: Secondary | ICD-10-CM | POA: Diagnosis not present

## 2018-12-08 DIAGNOSIS — R279 Unspecified lack of coordination: Secondary | ICD-10-CM | POA: Diagnosis not present

## 2018-12-11 DIAGNOSIS — R279 Unspecified lack of coordination: Secondary | ICD-10-CM | POA: Diagnosis not present

## 2018-12-11 DIAGNOSIS — M6281 Muscle weakness (generalized): Secondary | ICD-10-CM | POA: Diagnosis not present

## 2018-12-11 DIAGNOSIS — R488 Other symbolic dysfunctions: Secondary | ICD-10-CM | POA: Diagnosis not present

## 2018-12-11 DIAGNOSIS — R4189 Other symptoms and signs involving cognitive functions and awareness: Secondary | ICD-10-CM | POA: Diagnosis not present

## 2018-12-12 ENCOUNTER — Ambulatory Visit: Payer: Medicare Other | Admitting: Neurology

## 2018-12-12 DIAGNOSIS — M47816 Spondylosis without myelopathy or radiculopathy, lumbar region: Secondary | ICD-10-CM | POA: Diagnosis not present

## 2018-12-12 DIAGNOSIS — Z981 Arthrodesis status: Secondary | ICD-10-CM | POA: Diagnosis not present

## 2018-12-12 DIAGNOSIS — M5117 Intervertebral disc disorders with radiculopathy, lumbosacral region: Secondary | ICD-10-CM | POA: Diagnosis not present

## 2018-12-13 ENCOUNTER — Ambulatory Visit (INDEPENDENT_AMBULATORY_CARE_PROVIDER_SITE_OTHER): Payer: Medicare Other | Admitting: Neurology

## 2018-12-13 ENCOUNTER — Encounter: Payer: Self-pay | Admitting: Neurology

## 2018-12-13 ENCOUNTER — Other Ambulatory Visit: Payer: Self-pay

## 2018-12-13 VITALS — BP 125/77 | HR 68 | Ht 63.0 in | Wt 132.0 lb

## 2018-12-13 DIAGNOSIS — F03B18 Unspecified dementia, moderate, with other behavioral disturbance: Secondary | ICD-10-CM

## 2018-12-13 DIAGNOSIS — F0391 Unspecified dementia with behavioral disturbance: Secondary | ICD-10-CM | POA: Diagnosis not present

## 2018-12-13 MED ORDER — RIVASTIGMINE TARTRATE 3 MG PO CAPS: 3.0000 mg | ORAL_CAPSULE | Freq: Two times a day (BID) | ORAL | 3 refills | Status: DC

## 2018-12-13 MED ORDER — ESCITALOPRAM OXALATE 10 MG PO TABS: 10.0000 mg | ORAL_TABLET | Freq: Every day | ORAL | 3 refills | Status: DC

## 2018-12-13 NOTE — Progress Notes (Signed)
NEUROLOGY FOLLOW UP OFFICE NOTE  Alexandra Martinez [id number] 1930/03/13  HISTORY OF PRESENT ILLNESS: I had the pleasure of seeing Alexandra Martinez in follow-up in the neurology clinic on 12/13/2018. The patient was last seen 8 months ago for moderate dementia with behavioral disturbance. She is again accompanied by her niece [name] who helps supplement the history today. MMSE 12/30 in December 2019 (15/30 in May 2019). She had side effects on Donepezil and was started on Rivastigmine on last visit, better tolerating Rivastigmine 3mg  BID. She feels her memory is fine, [name] feels she is doing better. She lives in [place] where medications are administered. Sometimes she feels sick with her medications, usually with nausea in the morning. She still feels queasy despite talking it with crackers. [name] manages her finances. She is not driving. She mostly stays in her room, no wandering behavior. Sleep is good. She is having visual hallucinations, seeing people, thinking the people in the TV are in the room and taking to her. Sometimes this keeps her up at night. There is occasional paranoia thinking trees are being planted outside. She is independent with dressing but occasionally needs to be helped in to the shower. She usually ambulates with her walker but did not bring it today. She had a fall last March and has back and leg pain, she had an MRI lumbar spine yesterday. [name] feels she is depressed. She is on low dose Lexapro 5mg  daily without side effects.   History on Initial Assessment 09/07/2017: This is an 85 year old right-handed woman with a history of hypertension, hyperlipidemia, atrial fibrillation on anticoagulation with Xarelto, presenting for evaluation of memory loss. She does not feel her memory is much off, and states "I don't read as much as I used to." She lives alone. Her niece [name] accompanies her today and has difficulty providing additional history because "she will get ill at me." [name]  started noticing changes around a year ago. [name] sees the patient on average 3-4 times a week. [name] has tried to help her with the medications but she insists on doing it herself. [name] tried to get her a pillbox but is not sure if she uses them. [name] then says she takes the medications pretty well. She denies missing bill payments, but [name] reports she has forgotten a few bills 3-4 months ago and had to pay late fees for her water and cable bill. She denies getting lost driving, [name] is unsure, but does report that she visited family last Sunday who lives 3 miles from [place], but it took her 45 minutes to get to [employer] after. She told [name] the road was being torn up, but there is no construction going on. She gets very defensive and upset during the visit, and states she was doing fine until she had back surgery in 2015 and it still bothers her. Per PCP note, [name] gave a handwritten note about concerns, forgetting appointments, word-finding difficulties, and visual hallucinations. She has told family that she has woke up several times and found strange people in her home. Family attempts to discuss her condition have been repeatedly rebuffed. Her PCP tried to get home health but she told [facility] they were not needed when they called.   She has back pain and occasional neck pain, occasional constipation and urinary incontinence. No headaches, dizziness, diplopia, dysarthria/dysphagia, focal numbness/tingling/weakness, anosmia, or tremors. Her sister had Alzheimer's disease. No history of significant head injuries or alcohol use.  I personally reviewed MRI  brain without contrast done 06/24/2017 which did not show any acute changes, there was moderate to advanced atrophy, more prominent at the medial temporal lobes.  PAST MEDICAL HISTORY: Past Medical History:  Diagnosis Date  . Anemia    r/t uterine fibroids  . Atrial fib/flutter, transient    [facility] admission for RVR 03/2009  . DDD (degenerative  disc disease)   . Dysrhythmia    A-FIB  . GERD (gastroesophageal reflux disease)   . H/O bladder infections   . H/O hiatal hernia   . H/O thyroid nodule    surgically removed  . Heart murmur    moderate MR/TR 08/2013 echo [name])  . Hemorrhoids   . Hiatal hernia with gastroesophageal reflux 2008   by EGD,  [name]  . High cholesterol   . History of uterine fibroid   . Hydronephrosis    uretero- with congenital UPJ obstruction- Dr. Edrick Oh  . Hydronephrosis with ureteropelvic junction obstruction   . Hypertension    [facility]  . Hypothyroidism   . Nocturia   . Osteoporosis   . Polyuria   . Recurrent UTI     MEDICATIONS: Current Outpatient Medications on File Prior to Visit  Medication Sig Dispense Refill  . acetaminophen (TYLENOL) 500 MG tablet Take 500 mg by mouth every 6 (six) hours as needed for headache.    . escitalopram (LEXAPRO) 5 MG tablet TAKE 1 TABLET BY MOUTH DAILY 90 tablet 1  . esomeprazole (NEXIUM) 40 MG capsule TAKE 1 CAPSULE BY MOUTH TWICE DAILY BEFORE MEALS 180 capsule 1  . levothyroxine (SYNTHROID, LEVOTHROID) 50 MCG tablet TAKE 1 TABLET BY MOUTH DAILY 90 tablet 1  . losartan (COZAAR) 100 MG tablet TAKE ONE TABLET BY MOUTH EVERY DAY 90 tablet 1  . metoprolol tartrate (LOPRESSOR) 100 MG tablet TAKE ONE TABLET BY MOUTH TWICE DAILY 180 tablet 1  . ondansetron (ZOFRAN) 4 MG tablet TAKE 1 TABLET BY MOUTH EVERY 8 HOURS AS NEEDED FOR NAUSEA OR VOMITING 30 tablet 0  . polyethylene glycol powder (GLYCOLAX/MIRALAX) powder Take 17 g by mouth daily as needed for mild constipation. 3350 g 1  . pravastatin (PRAVACHOL) 20 MG tablet Take 20 mg by mouth daily.    . rivastigmine (EXELON) 3 MG capsule Take 1 capsule (3 mg total) by mouth 2 (two) times daily. 60 capsule 11  . Simethicone (EQ GAS RELIEF) 125 MG CAPS Take 125 mg by mouth daily as needed (gas). Reported on 07/01/2015    . traMADol (ULTRAM) 50 MG tablet Take 1 tablet (50 mg total) by mouth every 8  (eight) hours as needed. 90 tablet 5   Current Facility-Administered Medications on File Prior to Visit  Medication Dose Route Frequency Provider Last Rate Last Dose  . cyanocobalamin ((VITAMIN B-12)) injection 1,000 mcg  1,000 mcg Intramuscular Once Crecencio Mc, MD        ALLERGIES: Allergies  Allergen Reactions  . Celebrex [Celecoxib] Other (See Comments)  . Donepezil Other (See Comments)    Stomach pains  . Loratadine-Pseudoephedrine Er     Other reaction(s): Unknown  . Venlafaxine Other (See Comments)  . Vioxx [Rofecoxib] Other (See Comments)  . Codeine Itching and Rash  . Dexilant [Dexlansoprazole] Rash  . Eliquis [Apixaban] Rash    FAMILY HISTORY: Family History  Problem Relation Age of Onset  . Heart disease Mother   . Coronary artery disease Father        MI's    SOCIAL HISTORY: Social History   Socioeconomic  History  . Marital status: Widowed    Spouse name: Not on file  . Number of children: Not on file  . Years of education: Not on file  . Highest education level: Not on file  Occupational History  . Not on file  Social Needs  . Financial resource strain: Not very hard  . Food insecurity    Worry: Never true    Inability: Never true  . Transportation needs    Medical: No    Non-medical: No  Tobacco Use  . Smoking status: Never Smoker  . Smokeless tobacco: Never Used  Substance and Sexual Activity  . Alcohol use: No  . Drug use: No  . Sexual activity: Never  Lifestyle  . Physical activity    Days per week: 0 days    Minutes per session: Not on file  . Stress: Not at all  Relationships  . Social connections    Talks on phone: More than three times a week    Gets together: Three times a week    Attends religious service: More than 4 times per year    Active member of club or organization: Yes    Attends meetings of clubs or organizations: Never    Relationship status: Widowed  . Intimate partner violence    Fear of current or ex  partner: Not on file    Emotionally abused: Not on file    Physically abused: Not on file    Forced sexual activity: Not on file  Other Topics Concern  . Not on file  Social History Narrative   Lives alone, widowed in 2005.   Both son's have passed away   12th grade education   Retired Engineer, manufacturing systems    REVIEW OF SYSTEMS: Constitutional: No fevers, chills, or sweats, no generalized fatigue, change in appetite Eyes: No visual changes, double vision, eye pain Ear, nose and throat: No hearing loss, ear pain, nasal congestion, sore throat Cardiovascular: No chest pain, palpitations Respiratory:  No shortness of breath at rest or with exertion, wheezes GastrointestinaI: No nausea, vomiting, diarrhea, abdominal pain, fecal incontinence Genitourinary:  No dysuria, urinary retention or frequency Musculoskeletal:  No neck pain, +back pain Integumentary: No rash, pruritus, skin lesions Neurological: as above Psychiatric: + depression,no insomnia, anxiety Endocrine: No palpitations, fatigue, diaphoresis, mood swings, change in appetite, change in weight, increased thirst Hematologic/Lymphatic:  No anemia, purpura, petechiae. Allergic/Immunologic: no itchy/runny eyes, nasal congestion, recent allergic reactions, rashes  PHYSICAL EXAM: Vitals:   12/13/18 1330  BP: 125/77  Pulse: 68  SpO2: 94%   General: No acute distress Head:  Normocephalic/atraumatic Skin/Extremities: No rash, no edema Neurological Exam: alert and oriented to person. No aphasia or dysarthria. Fund of knowledge is reduced.  Recent and remote memory are impaired.  Attention and concentration are reduced. Difficulty with naming, able to repeat  MMSE - Mini Mental State Exam 12/13/2018 04/17/2018 12/01/2017  Not completed: - - Unable to complete  Orientation to time 0 2 -  Orientation to Place 0 0 -  Registration 3 3 -  Attention/ Calculation 0 1 -  Recall 0 0 -  Language- name 2 objects 0 1 -  Language- repeat 1 0 -   Language- follow 3 step command 3 2 -  Language- read & follow direction 1 1 -  Write a sentence 1 1 -  Copy design 0 1 -  Copy design-comments - - -  Total score 9 12 -   Cranial nerves: Pupils equal, round,  reactive to light. Extraocular movements intact with no nystagmus. Visual fields full. Facial sensation intact. No facial asymmetry. Motor: Bulk and tone normal, muscle strength 5/5 throughout with no pronator drift.  Sensation to light touch intact.  No extinction to double simultaneous stimulation.Finger to nose testing intact.  Gait slow and cautious due to back pain, no ataxia.  IMPRESSION: This is an 85 yo RH woman with a history of hypertension, hyperlipidemia, atrial fibrillation on anticoagulation with Xarelto, with moderate dementia with behavioral disturbance, likely Alzheimer's disease. MMSE today 9/30 (12/30 in December 2019, 15/30 in May 2019). MRI brain done 06/24/2017 did not show any acute changes, there was moderate to advanced atrophy, more prominent at the medial temporal lobes. Repeat TSH and B12 normal. We discussed increasing Rivastigmine, however her niece [name] feels she is more depressed. We agreed to increase Lexapro to 10mg  daily. Consideration for increasing Rivastigmine on her next visit will be done. Continue close supervision, she does not drive. Follow-up in 6 months, they know to call for any changes.   Thank you for allowing me to participate in her care.  Please do not hesitate to call for any questions or concerns.   Ellouise Newer, M.D.   CC: Dr. Derrel Nip

## 2018-12-13 NOTE — Patient Instructions (Signed)
1. Increase Lexapro to 10mg  daily 2. Continue Rivastigmine 3mg  twice a day 3. Continue close supervision 4. Follow-up in 6 months, call for any changes

## 2018-12-14 DIAGNOSIS — R488 Other symbolic dysfunctions: Secondary | ICD-10-CM | POA: Diagnosis not present

## 2018-12-14 DIAGNOSIS — R4189 Other symptoms and signs involving cognitive functions and awareness: Secondary | ICD-10-CM | POA: Diagnosis not present

## 2018-12-14 DIAGNOSIS — R279 Unspecified lack of coordination: Secondary | ICD-10-CM | POA: Diagnosis not present

## 2018-12-14 DIAGNOSIS — M6281 Muscle weakness (generalized): Secondary | ICD-10-CM | POA: Diagnosis not present

## 2018-12-18 DIAGNOSIS — R488 Other symbolic dysfunctions: Secondary | ICD-10-CM | POA: Diagnosis not present

## 2018-12-18 DIAGNOSIS — R279 Unspecified lack of coordination: Secondary | ICD-10-CM | POA: Diagnosis not present

## 2018-12-18 DIAGNOSIS — M6281 Muscle weakness (generalized): Secondary | ICD-10-CM | POA: Diagnosis not present

## 2018-12-18 DIAGNOSIS — R4189 Other symptoms and signs involving cognitive functions and awareness: Secondary | ICD-10-CM | POA: Diagnosis not present

## 2018-12-20 DIAGNOSIS — Z981 Arthrodesis status: Secondary | ICD-10-CM | POA: Diagnosis not present

## 2018-12-20 DIAGNOSIS — M47816 Spondylosis without myelopathy or radiculopathy, lumbar region: Secondary | ICD-10-CM | POA: Diagnosis not present

## 2018-12-20 DIAGNOSIS — M5416 Radiculopathy, lumbar region: Secondary | ICD-10-CM | POA: Diagnosis not present

## 2018-12-21 DIAGNOSIS — M6281 Muscle weakness (generalized): Secondary | ICD-10-CM | POA: Diagnosis not present

## 2018-12-21 DIAGNOSIS — M25552 Pain in left hip: Secondary | ICD-10-CM | POA: Diagnosis not present

## 2018-12-21 DIAGNOSIS — R279 Unspecified lack of coordination: Secondary | ICD-10-CM | POA: Diagnosis not present

## 2018-12-21 DIAGNOSIS — R488 Other symbolic dysfunctions: Secondary | ICD-10-CM | POA: Diagnosis not present

## 2018-12-21 DIAGNOSIS — R2681 Unsteadiness on feet: Secondary | ICD-10-CM | POA: Diagnosis not present

## 2018-12-22 DIAGNOSIS — M6281 Muscle weakness (generalized): Secondary | ICD-10-CM | POA: Diagnosis not present

## 2018-12-22 DIAGNOSIS — R279 Unspecified lack of coordination: Secondary | ICD-10-CM | POA: Diagnosis not present

## 2018-12-22 DIAGNOSIS — R2681 Unsteadiness on feet: Secondary | ICD-10-CM | POA: Diagnosis not present

## 2018-12-22 DIAGNOSIS — R488 Other symbolic dysfunctions: Secondary | ICD-10-CM | POA: Diagnosis not present

## 2018-12-22 DIAGNOSIS — M25552 Pain in left hip: Secondary | ICD-10-CM | POA: Diagnosis not present

## 2018-12-25 DIAGNOSIS — M6281 Muscle weakness (generalized): Secondary | ICD-10-CM | POA: Diagnosis not present

## 2018-12-25 DIAGNOSIS — M25552 Pain in left hip: Secondary | ICD-10-CM | POA: Diagnosis not present

## 2018-12-25 DIAGNOSIS — R488 Other symbolic dysfunctions: Secondary | ICD-10-CM | POA: Diagnosis not present

## 2018-12-25 DIAGNOSIS — R279 Unspecified lack of coordination: Secondary | ICD-10-CM | POA: Diagnosis not present

## 2018-12-25 DIAGNOSIS — R2681 Unsteadiness on feet: Secondary | ICD-10-CM | POA: Diagnosis not present

## 2018-12-26 DIAGNOSIS — R2681 Unsteadiness on feet: Secondary | ICD-10-CM | POA: Diagnosis not present

## 2018-12-26 DIAGNOSIS — R279 Unspecified lack of coordination: Secondary | ICD-10-CM | POA: Diagnosis not present

## 2018-12-26 DIAGNOSIS — M6281 Muscle weakness (generalized): Secondary | ICD-10-CM | POA: Diagnosis not present

## 2018-12-26 DIAGNOSIS — M25552 Pain in left hip: Secondary | ICD-10-CM | POA: Diagnosis not present

## 2018-12-26 DIAGNOSIS — R488 Other symbolic dysfunctions: Secondary | ICD-10-CM | POA: Diagnosis not present

## 2018-12-27 DIAGNOSIS — R279 Unspecified lack of coordination: Secondary | ICD-10-CM | POA: Diagnosis not present

## 2018-12-27 DIAGNOSIS — M25552 Pain in left hip: Secondary | ICD-10-CM | POA: Diagnosis not present

## 2018-12-27 DIAGNOSIS — M6281 Muscle weakness (generalized): Secondary | ICD-10-CM | POA: Diagnosis not present

## 2018-12-27 DIAGNOSIS — R2681 Unsteadiness on feet: Secondary | ICD-10-CM | POA: Diagnosis not present

## 2018-12-27 DIAGNOSIS — R488 Other symbolic dysfunctions: Secondary | ICD-10-CM | POA: Diagnosis not present

## 2018-12-28 DIAGNOSIS — R2681 Unsteadiness on feet: Secondary | ICD-10-CM | POA: Diagnosis not present

## 2018-12-28 DIAGNOSIS — R488 Other symbolic dysfunctions: Secondary | ICD-10-CM | POA: Diagnosis not present

## 2018-12-28 DIAGNOSIS — M25552 Pain in left hip: Secondary | ICD-10-CM | POA: Diagnosis not present

## 2018-12-28 DIAGNOSIS — M6281 Muscle weakness (generalized): Secondary | ICD-10-CM | POA: Diagnosis not present

## 2018-12-28 DIAGNOSIS — R279 Unspecified lack of coordination: Secondary | ICD-10-CM | POA: Diagnosis not present

## 2018-12-30 DIAGNOSIS — R488 Other symbolic dysfunctions: Secondary | ICD-10-CM | POA: Diagnosis not present

## 2018-12-30 DIAGNOSIS — M6281 Muscle weakness (generalized): Secondary | ICD-10-CM | POA: Diagnosis not present

## 2018-12-30 DIAGNOSIS — M25552 Pain in left hip: Secondary | ICD-10-CM | POA: Diagnosis not present

## 2018-12-30 DIAGNOSIS — R2681 Unsteadiness on feet: Secondary | ICD-10-CM | POA: Diagnosis not present

## 2018-12-30 DIAGNOSIS — R279 Unspecified lack of coordination: Secondary | ICD-10-CM | POA: Diagnosis not present

## 2019-01-01 DIAGNOSIS — M6281 Muscle weakness (generalized): Secondary | ICD-10-CM | POA: Diagnosis not present

## 2019-01-01 DIAGNOSIS — R279 Unspecified lack of coordination: Secondary | ICD-10-CM | POA: Diagnosis not present

## 2019-01-01 DIAGNOSIS — M25552 Pain in left hip: Secondary | ICD-10-CM | POA: Diagnosis not present

## 2019-01-01 DIAGNOSIS — R2681 Unsteadiness on feet: Secondary | ICD-10-CM | POA: Diagnosis not present

## 2019-01-01 DIAGNOSIS — R488 Other symbolic dysfunctions: Secondary | ICD-10-CM | POA: Diagnosis not present

## 2019-01-03 DIAGNOSIS — M25552 Pain in left hip: Secondary | ICD-10-CM | POA: Diagnosis not present

## 2019-01-03 DIAGNOSIS — R2681 Unsteadiness on feet: Secondary | ICD-10-CM | POA: Diagnosis not present

## 2019-01-03 DIAGNOSIS — R279 Unspecified lack of coordination: Secondary | ICD-10-CM | POA: Diagnosis not present

## 2019-01-03 DIAGNOSIS — R488 Other symbolic dysfunctions: Secondary | ICD-10-CM | POA: Diagnosis not present

## 2019-01-03 DIAGNOSIS — M6281 Muscle weakness (generalized): Secondary | ICD-10-CM | POA: Diagnosis not present

## 2019-01-04 DIAGNOSIS — M25552 Pain in left hip: Secondary | ICD-10-CM | POA: Diagnosis not present

## 2019-01-04 DIAGNOSIS — M6281 Muscle weakness (generalized): Secondary | ICD-10-CM | POA: Diagnosis not present

## 2019-01-04 DIAGNOSIS — R2681 Unsteadiness on feet: Secondary | ICD-10-CM | POA: Diagnosis not present

## 2019-01-04 DIAGNOSIS — R279 Unspecified lack of coordination: Secondary | ICD-10-CM | POA: Diagnosis not present

## 2019-01-04 DIAGNOSIS — R488 Other symbolic dysfunctions: Secondary | ICD-10-CM | POA: Diagnosis not present

## 2019-01-05 DIAGNOSIS — M25552 Pain in left hip: Secondary | ICD-10-CM | POA: Diagnosis not present

## 2019-01-05 DIAGNOSIS — R488 Other symbolic dysfunctions: Secondary | ICD-10-CM | POA: Diagnosis not present

## 2019-01-05 DIAGNOSIS — M6281 Muscle weakness (generalized): Secondary | ICD-10-CM | POA: Diagnosis not present

## 2019-01-05 DIAGNOSIS — R2681 Unsteadiness on feet: Secondary | ICD-10-CM | POA: Diagnosis not present

## 2019-01-05 DIAGNOSIS — R279 Unspecified lack of coordination: Secondary | ICD-10-CM | POA: Diagnosis not present

## 2019-01-13 DIAGNOSIS — M25552 Pain in left hip: Secondary | ICD-10-CM | POA: Diagnosis not present

## 2019-01-13 DIAGNOSIS — R2681 Unsteadiness on feet: Secondary | ICD-10-CM | POA: Diagnosis not present

## 2019-01-13 DIAGNOSIS — M6281 Muscle weakness (generalized): Secondary | ICD-10-CM | POA: Diagnosis not present

## 2019-01-13 DIAGNOSIS — R488 Other symbolic dysfunctions: Secondary | ICD-10-CM | POA: Diagnosis not present

## 2019-01-13 DIAGNOSIS — R279 Unspecified lack of coordination: Secondary | ICD-10-CM | POA: Diagnosis not present

## 2019-01-15 DIAGNOSIS — R279 Unspecified lack of coordination: Secondary | ICD-10-CM | POA: Diagnosis not present

## 2019-01-15 DIAGNOSIS — R2681 Unsteadiness on feet: Secondary | ICD-10-CM | POA: Diagnosis not present

## 2019-01-15 DIAGNOSIS — M25552 Pain in left hip: Secondary | ICD-10-CM | POA: Diagnosis not present

## 2019-01-15 DIAGNOSIS — R488 Other symbolic dysfunctions: Secondary | ICD-10-CM | POA: Diagnosis not present

## 2019-01-15 DIAGNOSIS — M6281 Muscle weakness (generalized): Secondary | ICD-10-CM | POA: Diagnosis not present

## 2019-01-16 DIAGNOSIS — M6281 Muscle weakness (generalized): Secondary | ICD-10-CM | POA: Diagnosis not present

## 2019-01-16 DIAGNOSIS — M25552 Pain in left hip: Secondary | ICD-10-CM | POA: Diagnosis not present

## 2019-01-16 DIAGNOSIS — R2681 Unsteadiness on feet: Secondary | ICD-10-CM | POA: Diagnosis not present

## 2019-01-16 DIAGNOSIS — R488 Other symbolic dysfunctions: Secondary | ICD-10-CM | POA: Diagnosis not present

## 2019-01-16 DIAGNOSIS — R279 Unspecified lack of coordination: Secondary | ICD-10-CM | POA: Diagnosis not present

## 2019-01-17 DIAGNOSIS — R2681 Unsteadiness on feet: Secondary | ICD-10-CM | POA: Diagnosis not present

## 2019-01-17 DIAGNOSIS — R279 Unspecified lack of coordination: Secondary | ICD-10-CM | POA: Diagnosis not present

## 2019-01-17 DIAGNOSIS — M25552 Pain in left hip: Secondary | ICD-10-CM | POA: Diagnosis not present

## 2019-01-17 DIAGNOSIS — M6281 Muscle weakness (generalized): Secondary | ICD-10-CM | POA: Diagnosis not present

## 2019-01-17 DIAGNOSIS — R488 Other symbolic dysfunctions: Secondary | ICD-10-CM | POA: Diagnosis not present

## 2019-01-18 DIAGNOSIS — R2681 Unsteadiness on feet: Secondary | ICD-10-CM | POA: Diagnosis not present

## 2019-01-18 DIAGNOSIS — R41841 Cognitive communication deficit: Secondary | ICD-10-CM | POA: Diagnosis not present

## 2019-01-18 DIAGNOSIS — M25552 Pain in left hip: Secondary | ICD-10-CM | POA: Diagnosis not present

## 2019-01-18 DIAGNOSIS — M6281 Muscle weakness (generalized): Secondary | ICD-10-CM | POA: Diagnosis not present

## 2019-01-18 DIAGNOSIS — R279 Unspecified lack of coordination: Secondary | ICD-10-CM | POA: Diagnosis not present

## 2019-01-18 DIAGNOSIS — R488 Other symbolic dysfunctions: Secondary | ICD-10-CM | POA: Diagnosis not present

## 2019-01-19 DIAGNOSIS — R279 Unspecified lack of coordination: Secondary | ICD-10-CM | POA: Diagnosis not present

## 2019-01-19 DIAGNOSIS — R488 Other symbolic dysfunctions: Secondary | ICD-10-CM | POA: Diagnosis not present

## 2019-01-19 DIAGNOSIS — R41841 Cognitive communication deficit: Secondary | ICD-10-CM | POA: Diagnosis not present

## 2019-01-19 DIAGNOSIS — M25552 Pain in left hip: Secondary | ICD-10-CM | POA: Diagnosis not present

## 2019-01-19 DIAGNOSIS — R2681 Unsteadiness on feet: Secondary | ICD-10-CM | POA: Diagnosis not present

## 2019-01-19 DIAGNOSIS — M6281 Muscle weakness (generalized): Secondary | ICD-10-CM | POA: Diagnosis not present

## 2019-01-21 DIAGNOSIS — Z23 Encounter for immunization: Secondary | ICD-10-CM | POA: Diagnosis not present

## 2019-01-22 DIAGNOSIS — R279 Unspecified lack of coordination: Secondary | ICD-10-CM | POA: Diagnosis not present

## 2019-01-22 DIAGNOSIS — R2681 Unsteadiness on feet: Secondary | ICD-10-CM | POA: Diagnosis not present

## 2019-01-22 DIAGNOSIS — M25552 Pain in left hip: Secondary | ICD-10-CM | POA: Diagnosis not present

## 2019-01-22 DIAGNOSIS — M6281 Muscle weakness (generalized): Secondary | ICD-10-CM | POA: Diagnosis not present

## 2019-01-22 DIAGNOSIS — R488 Other symbolic dysfunctions: Secondary | ICD-10-CM | POA: Diagnosis not present

## 2019-01-22 DIAGNOSIS — R41841 Cognitive communication deficit: Secondary | ICD-10-CM | POA: Diagnosis not present

## 2019-01-24 DIAGNOSIS — R279 Unspecified lack of coordination: Secondary | ICD-10-CM | POA: Diagnosis not present

## 2019-01-24 DIAGNOSIS — M6281 Muscle weakness (generalized): Secondary | ICD-10-CM | POA: Diagnosis not present

## 2019-01-24 DIAGNOSIS — R41841 Cognitive communication deficit: Secondary | ICD-10-CM | POA: Diagnosis not present

## 2019-01-24 DIAGNOSIS — R488 Other symbolic dysfunctions: Secondary | ICD-10-CM | POA: Diagnosis not present

## 2019-01-24 DIAGNOSIS — R2681 Unsteadiness on feet: Secondary | ICD-10-CM | POA: Diagnosis not present

## 2019-01-24 DIAGNOSIS — M25552 Pain in left hip: Secondary | ICD-10-CM | POA: Diagnosis not present

## 2019-01-25 DIAGNOSIS — D631 Anemia in chronic kidney disease: Secondary | ICD-10-CM | POA: Diagnosis not present

## 2019-01-25 DIAGNOSIS — I10 Essential (primary) hypertension: Secondary | ICD-10-CM | POA: Diagnosis not present

## 2019-01-25 DIAGNOSIS — N2581 Secondary hyperparathyroidism of renal origin: Secondary | ICD-10-CM | POA: Diagnosis not present

## 2019-01-25 DIAGNOSIS — R2681 Unsteadiness on feet: Secondary | ICD-10-CM | POA: Diagnosis not present

## 2019-01-25 DIAGNOSIS — M6281 Muscle weakness (generalized): Secondary | ICD-10-CM | POA: Diagnosis not present

## 2019-01-25 DIAGNOSIS — R41841 Cognitive communication deficit: Secondary | ICD-10-CM | POA: Diagnosis not present

## 2019-01-25 DIAGNOSIS — R279 Unspecified lack of coordination: Secondary | ICD-10-CM | POA: Diagnosis not present

## 2019-01-25 DIAGNOSIS — R488 Other symbolic dysfunctions: Secondary | ICD-10-CM | POA: Diagnosis not present

## 2019-01-25 DIAGNOSIS — N1832 Chronic kidney disease, stage 3b: Secondary | ICD-10-CM | POA: Diagnosis not present

## 2019-01-25 DIAGNOSIS — M25552 Pain in left hip: Secondary | ICD-10-CM | POA: Diagnosis not present

## 2019-01-26 DIAGNOSIS — R2681 Unsteadiness on feet: Secondary | ICD-10-CM | POA: Diagnosis not present

## 2019-01-26 DIAGNOSIS — R41841 Cognitive communication deficit: Secondary | ICD-10-CM | POA: Diagnosis not present

## 2019-01-26 DIAGNOSIS — M6281 Muscle weakness (generalized): Secondary | ICD-10-CM | POA: Diagnosis not present

## 2019-01-26 DIAGNOSIS — R488 Other symbolic dysfunctions: Secondary | ICD-10-CM | POA: Diagnosis not present

## 2019-01-26 DIAGNOSIS — M25552 Pain in left hip: Secondary | ICD-10-CM | POA: Diagnosis not present

## 2019-01-26 DIAGNOSIS — R279 Unspecified lack of coordination: Secondary | ICD-10-CM | POA: Diagnosis not present

## 2019-01-29 DIAGNOSIS — R2681 Unsteadiness on feet: Secondary | ICD-10-CM | POA: Diagnosis not present

## 2019-01-29 DIAGNOSIS — N1832 Chronic kidney disease, stage 3b: Secondary | ICD-10-CM | POA: Diagnosis not present

## 2019-01-29 DIAGNOSIS — I482 Chronic atrial fibrillation, unspecified: Secondary | ICD-10-CM | POA: Diagnosis not present

## 2019-01-29 DIAGNOSIS — M6281 Muscle weakness (generalized): Secondary | ICD-10-CM | POA: Diagnosis not present

## 2019-01-29 DIAGNOSIS — I700 Atherosclerosis of aorta: Secondary | ICD-10-CM | POA: Diagnosis not present

## 2019-01-29 DIAGNOSIS — R488 Other symbolic dysfunctions: Secondary | ICD-10-CM | POA: Diagnosis not present

## 2019-01-29 DIAGNOSIS — M25552 Pain in left hip: Secondary | ICD-10-CM | POA: Diagnosis not present

## 2019-01-29 DIAGNOSIS — R279 Unspecified lack of coordination: Secondary | ICD-10-CM | POA: Diagnosis not present

## 2019-01-29 DIAGNOSIS — E782 Mixed hyperlipidemia: Secondary | ICD-10-CM | POA: Diagnosis not present

## 2019-01-29 DIAGNOSIS — I10 Essential (primary) hypertension: Secondary | ICD-10-CM | POA: Diagnosis not present

## 2019-01-29 DIAGNOSIS — R41841 Cognitive communication deficit: Secondary | ICD-10-CM | POA: Diagnosis not present

## 2019-01-30 DIAGNOSIS — R488 Other symbolic dysfunctions: Secondary | ICD-10-CM | POA: Diagnosis not present

## 2019-01-30 DIAGNOSIS — R41841 Cognitive communication deficit: Secondary | ICD-10-CM | POA: Diagnosis not present

## 2019-01-30 DIAGNOSIS — M25552 Pain in left hip: Secondary | ICD-10-CM | POA: Diagnosis not present

## 2019-01-30 DIAGNOSIS — R2681 Unsteadiness on feet: Secondary | ICD-10-CM | POA: Diagnosis not present

## 2019-01-30 DIAGNOSIS — M6281 Muscle weakness (generalized): Secondary | ICD-10-CM | POA: Diagnosis not present

## 2019-01-30 DIAGNOSIS — R279 Unspecified lack of coordination: Secondary | ICD-10-CM | POA: Diagnosis not present

## 2019-01-31 DIAGNOSIS — R488 Other symbolic dysfunctions: Secondary | ICD-10-CM | POA: Diagnosis not present

## 2019-01-31 DIAGNOSIS — M6281 Muscle weakness (generalized): Secondary | ICD-10-CM | POA: Diagnosis not present

## 2019-01-31 DIAGNOSIS — R279 Unspecified lack of coordination: Secondary | ICD-10-CM | POA: Diagnosis not present

## 2019-01-31 DIAGNOSIS — M25552 Pain in left hip: Secondary | ICD-10-CM | POA: Diagnosis not present

## 2019-01-31 DIAGNOSIS — R2681 Unsteadiness on feet: Secondary | ICD-10-CM | POA: Diagnosis not present

## 2019-01-31 DIAGNOSIS — R41841 Cognitive communication deficit: Secondary | ICD-10-CM | POA: Diagnosis not present

## 2019-02-01 ENCOUNTER — Other Ambulatory Visit: Payer: Self-pay | Admitting: Internal Medicine

## 2019-02-01 DIAGNOSIS — R2681 Unsteadiness on feet: Secondary | ICD-10-CM | POA: Diagnosis not present

## 2019-02-01 DIAGNOSIS — M25552 Pain in left hip: Secondary | ICD-10-CM | POA: Diagnosis not present

## 2019-02-01 DIAGNOSIS — R488 Other symbolic dysfunctions: Secondary | ICD-10-CM | POA: Diagnosis not present

## 2019-02-01 DIAGNOSIS — R41841 Cognitive communication deficit: Secondary | ICD-10-CM | POA: Diagnosis not present

## 2019-02-01 DIAGNOSIS — M6281 Muscle weakness (generalized): Secondary | ICD-10-CM | POA: Diagnosis not present

## 2019-02-01 DIAGNOSIS — R279 Unspecified lack of coordination: Secondary | ICD-10-CM | POA: Diagnosis not present

## 2019-02-02 DIAGNOSIS — R41841 Cognitive communication deficit: Secondary | ICD-10-CM | POA: Diagnosis not present

## 2019-02-02 DIAGNOSIS — R488 Other symbolic dysfunctions: Secondary | ICD-10-CM | POA: Diagnosis not present

## 2019-02-02 DIAGNOSIS — M25552 Pain in left hip: Secondary | ICD-10-CM | POA: Diagnosis not present

## 2019-02-02 DIAGNOSIS — R2681 Unsteadiness on feet: Secondary | ICD-10-CM | POA: Diagnosis not present

## 2019-02-02 DIAGNOSIS — M6281 Muscle weakness (generalized): Secondary | ICD-10-CM | POA: Diagnosis not present

## 2019-02-02 DIAGNOSIS — R279 Unspecified lack of coordination: Secondary | ICD-10-CM | POA: Diagnosis not present

## 2019-02-05 DIAGNOSIS — M6281 Muscle weakness (generalized): Secondary | ICD-10-CM | POA: Diagnosis not present

## 2019-02-05 DIAGNOSIS — R2681 Unsteadiness on feet: Secondary | ICD-10-CM | POA: Diagnosis not present

## 2019-02-05 DIAGNOSIS — M25552 Pain in left hip: Secondary | ICD-10-CM | POA: Diagnosis not present

## 2019-02-05 DIAGNOSIS — R488 Other symbolic dysfunctions: Secondary | ICD-10-CM | POA: Diagnosis not present

## 2019-02-05 DIAGNOSIS — R41841 Cognitive communication deficit: Secondary | ICD-10-CM | POA: Diagnosis not present

## 2019-02-05 DIAGNOSIS — R279 Unspecified lack of coordination: Secondary | ICD-10-CM | POA: Diagnosis not present

## 2019-02-06 DIAGNOSIS — R488 Other symbolic dysfunctions: Secondary | ICD-10-CM | POA: Diagnosis not present

## 2019-02-06 DIAGNOSIS — M6281 Muscle weakness (generalized): Secondary | ICD-10-CM | POA: Diagnosis not present

## 2019-02-06 DIAGNOSIS — M25552 Pain in left hip: Secondary | ICD-10-CM | POA: Diagnosis not present

## 2019-02-06 DIAGNOSIS — R41841 Cognitive communication deficit: Secondary | ICD-10-CM | POA: Diagnosis not present

## 2019-02-06 DIAGNOSIS — R2681 Unsteadiness on feet: Secondary | ICD-10-CM | POA: Diagnosis not present

## 2019-02-06 DIAGNOSIS — R279 Unspecified lack of coordination: Secondary | ICD-10-CM | POA: Diagnosis not present

## 2019-02-07 DIAGNOSIS — R488 Other symbolic dysfunctions: Secondary | ICD-10-CM | POA: Diagnosis not present

## 2019-02-07 DIAGNOSIS — M25552 Pain in left hip: Secondary | ICD-10-CM | POA: Diagnosis not present

## 2019-02-07 DIAGNOSIS — M6281 Muscle weakness (generalized): Secondary | ICD-10-CM | POA: Diagnosis not present

## 2019-02-07 DIAGNOSIS — R279 Unspecified lack of coordination: Secondary | ICD-10-CM | POA: Diagnosis not present

## 2019-02-07 DIAGNOSIS — R41841 Cognitive communication deficit: Secondary | ICD-10-CM | POA: Diagnosis not present

## 2019-02-07 DIAGNOSIS — R2681 Unsteadiness on feet: Secondary | ICD-10-CM | POA: Diagnosis not present

## 2019-02-08 DIAGNOSIS — R279 Unspecified lack of coordination: Secondary | ICD-10-CM | POA: Diagnosis not present

## 2019-02-08 DIAGNOSIS — M25552 Pain in left hip: Secondary | ICD-10-CM | POA: Diagnosis not present

## 2019-02-08 DIAGNOSIS — R41841 Cognitive communication deficit: Secondary | ICD-10-CM | POA: Diagnosis not present

## 2019-02-08 DIAGNOSIS — R488 Other symbolic dysfunctions: Secondary | ICD-10-CM | POA: Diagnosis not present

## 2019-02-08 DIAGNOSIS — M6281 Muscle weakness (generalized): Secondary | ICD-10-CM | POA: Diagnosis not present

## 2019-02-08 DIAGNOSIS — R2681 Unsteadiness on feet: Secondary | ICD-10-CM | POA: Diagnosis not present

## 2019-02-09 DIAGNOSIS — M25552 Pain in left hip: Secondary | ICD-10-CM | POA: Diagnosis not present

## 2019-02-09 DIAGNOSIS — R279 Unspecified lack of coordination: Secondary | ICD-10-CM | POA: Diagnosis not present

## 2019-02-09 DIAGNOSIS — R41841 Cognitive communication deficit: Secondary | ICD-10-CM | POA: Diagnosis not present

## 2019-02-09 DIAGNOSIS — R488 Other symbolic dysfunctions: Secondary | ICD-10-CM | POA: Diagnosis not present

## 2019-02-09 DIAGNOSIS — M6281 Muscle weakness (generalized): Secondary | ICD-10-CM | POA: Diagnosis not present

## 2019-02-09 DIAGNOSIS — R2681 Unsteadiness on feet: Secondary | ICD-10-CM | POA: Diagnosis not present

## 2019-02-12 DIAGNOSIS — R488 Other symbolic dysfunctions: Secondary | ICD-10-CM | POA: Diagnosis not present

## 2019-02-12 DIAGNOSIS — M25552 Pain in left hip: Secondary | ICD-10-CM | POA: Diagnosis not present

## 2019-02-12 DIAGNOSIS — M6281 Muscle weakness (generalized): Secondary | ICD-10-CM | POA: Diagnosis not present

## 2019-02-12 DIAGNOSIS — R2681 Unsteadiness on feet: Secondary | ICD-10-CM | POA: Diagnosis not present

## 2019-02-12 DIAGNOSIS — R41841 Cognitive communication deficit: Secondary | ICD-10-CM | POA: Diagnosis not present

## 2019-02-12 DIAGNOSIS — R279 Unspecified lack of coordination: Secondary | ICD-10-CM | POA: Diagnosis not present

## 2019-02-13 DIAGNOSIS — M6281 Muscle weakness (generalized): Secondary | ICD-10-CM | POA: Diagnosis not present

## 2019-02-13 DIAGNOSIS — R41841 Cognitive communication deficit: Secondary | ICD-10-CM | POA: Diagnosis not present

## 2019-02-13 DIAGNOSIS — R488 Other symbolic dysfunctions: Secondary | ICD-10-CM | POA: Diagnosis not present

## 2019-02-13 DIAGNOSIS — R279 Unspecified lack of coordination: Secondary | ICD-10-CM | POA: Diagnosis not present

## 2019-02-13 DIAGNOSIS — R2681 Unsteadiness on feet: Secondary | ICD-10-CM | POA: Diagnosis not present

## 2019-02-13 DIAGNOSIS — M25552 Pain in left hip: Secondary | ICD-10-CM | POA: Diagnosis not present

## 2019-02-14 DIAGNOSIS — R279 Unspecified lack of coordination: Secondary | ICD-10-CM | POA: Diagnosis not present

## 2019-02-14 DIAGNOSIS — R488 Other symbolic dysfunctions: Secondary | ICD-10-CM | POA: Diagnosis not present

## 2019-02-14 DIAGNOSIS — M25552 Pain in left hip: Secondary | ICD-10-CM | POA: Diagnosis not present

## 2019-02-14 DIAGNOSIS — M6281 Muscle weakness (generalized): Secondary | ICD-10-CM | POA: Diagnosis not present

## 2019-02-14 DIAGNOSIS — R41841 Cognitive communication deficit: Secondary | ICD-10-CM | POA: Diagnosis not present

## 2019-02-14 DIAGNOSIS — R2681 Unsteadiness on feet: Secondary | ICD-10-CM | POA: Diagnosis not present

## 2019-02-15 DIAGNOSIS — R41841 Cognitive communication deficit: Secondary | ICD-10-CM | POA: Diagnosis not present

## 2019-02-15 DIAGNOSIS — M25552 Pain in left hip: Secondary | ICD-10-CM | POA: Diagnosis not present

## 2019-02-15 DIAGNOSIS — R279 Unspecified lack of coordination: Secondary | ICD-10-CM | POA: Diagnosis not present

## 2019-02-15 DIAGNOSIS — R488 Other symbolic dysfunctions: Secondary | ICD-10-CM | POA: Diagnosis not present

## 2019-02-15 DIAGNOSIS — M6281 Muscle weakness (generalized): Secondary | ICD-10-CM | POA: Diagnosis not present

## 2019-02-15 DIAGNOSIS — R2681 Unsteadiness on feet: Secondary | ICD-10-CM | POA: Diagnosis not present

## 2019-02-16 DIAGNOSIS — M25552 Pain in left hip: Secondary | ICD-10-CM | POA: Diagnosis not present

## 2019-02-16 DIAGNOSIS — R41841 Cognitive communication deficit: Secondary | ICD-10-CM | POA: Diagnosis not present

## 2019-02-16 DIAGNOSIS — M6281 Muscle weakness (generalized): Secondary | ICD-10-CM | POA: Diagnosis not present

## 2019-02-16 DIAGNOSIS — R2681 Unsteadiness on feet: Secondary | ICD-10-CM | POA: Diagnosis not present

## 2019-02-16 DIAGNOSIS — R279 Unspecified lack of coordination: Secondary | ICD-10-CM | POA: Diagnosis not present

## 2019-02-16 DIAGNOSIS — R488 Other symbolic dysfunctions: Secondary | ICD-10-CM | POA: Diagnosis not present

## 2019-02-19 DIAGNOSIS — M6281 Muscle weakness (generalized): Secondary | ICD-10-CM | POA: Diagnosis not present

## 2019-02-19 DIAGNOSIS — R279 Unspecified lack of coordination: Secondary | ICD-10-CM | POA: Diagnosis not present

## 2019-02-19 DIAGNOSIS — R41841 Cognitive communication deficit: Secondary | ICD-10-CM | POA: Diagnosis not present

## 2019-02-19 DIAGNOSIS — R488 Other symbolic dysfunctions: Secondary | ICD-10-CM | POA: Diagnosis not present

## 2019-02-19 DIAGNOSIS — R2681 Unsteadiness on feet: Secondary | ICD-10-CM | POA: Diagnosis not present

## 2019-02-19 DIAGNOSIS — M25552 Pain in left hip: Secondary | ICD-10-CM | POA: Diagnosis not present

## 2019-02-20 DIAGNOSIS — M6281 Muscle weakness (generalized): Secondary | ICD-10-CM | POA: Diagnosis not present

## 2019-02-20 DIAGNOSIS — R41841 Cognitive communication deficit: Secondary | ICD-10-CM | POA: Diagnosis not present

## 2019-02-20 DIAGNOSIS — I10 Essential (primary) hypertension: Secondary | ICD-10-CM | POA: Diagnosis not present

## 2019-02-20 DIAGNOSIS — R279 Unspecified lack of coordination: Secondary | ICD-10-CM | POA: Diagnosis not present

## 2019-02-20 DIAGNOSIS — M25552 Pain in left hip: Secondary | ICD-10-CM | POA: Diagnosis not present

## 2019-02-20 DIAGNOSIS — R600 Localized edema: Secondary | ICD-10-CM | POA: Diagnosis not present

## 2019-02-20 DIAGNOSIS — I482 Chronic atrial fibrillation, unspecified: Secondary | ICD-10-CM | POA: Diagnosis not present

## 2019-02-20 DIAGNOSIS — I38 Endocarditis, valve unspecified: Secondary | ICD-10-CM | POA: Diagnosis not present

## 2019-02-20 DIAGNOSIS — R2681 Unsteadiness on feet: Secondary | ICD-10-CM | POA: Diagnosis not present

## 2019-02-20 DIAGNOSIS — E782 Mixed hyperlipidemia: Secondary | ICD-10-CM | POA: Diagnosis not present

## 2019-02-20 DIAGNOSIS — R488 Other symbolic dysfunctions: Secondary | ICD-10-CM | POA: Diagnosis not present

## 2019-02-21 DIAGNOSIS — R41841 Cognitive communication deficit: Secondary | ICD-10-CM | POA: Diagnosis not present

## 2019-02-21 DIAGNOSIS — M6281 Muscle weakness (generalized): Secondary | ICD-10-CM | POA: Diagnosis not present

## 2019-02-21 DIAGNOSIS — M25552 Pain in left hip: Secondary | ICD-10-CM | POA: Diagnosis not present

## 2019-02-21 DIAGNOSIS — R279 Unspecified lack of coordination: Secondary | ICD-10-CM | POA: Diagnosis not present

## 2019-02-21 DIAGNOSIS — R488 Other symbolic dysfunctions: Secondary | ICD-10-CM | POA: Diagnosis not present

## 2019-02-21 DIAGNOSIS — R2681 Unsteadiness on feet: Secondary | ICD-10-CM | POA: Diagnosis not present

## 2019-02-22 DIAGNOSIS — R41841 Cognitive communication deficit: Secondary | ICD-10-CM | POA: Diagnosis not present

## 2019-02-22 DIAGNOSIS — R488 Other symbolic dysfunctions: Secondary | ICD-10-CM | POA: Diagnosis not present

## 2019-02-22 DIAGNOSIS — R279 Unspecified lack of coordination: Secondary | ICD-10-CM | POA: Diagnosis not present

## 2019-02-22 DIAGNOSIS — R2681 Unsteadiness on feet: Secondary | ICD-10-CM | POA: Diagnosis not present

## 2019-02-22 DIAGNOSIS — M6281 Muscle weakness (generalized): Secondary | ICD-10-CM | POA: Diagnosis not present

## 2019-02-22 DIAGNOSIS — M25552 Pain in left hip: Secondary | ICD-10-CM | POA: Diagnosis not present

## 2019-02-23 DIAGNOSIS — R41841 Cognitive communication deficit: Secondary | ICD-10-CM | POA: Diagnosis not present

## 2019-02-23 DIAGNOSIS — R488 Other symbolic dysfunctions: Secondary | ICD-10-CM | POA: Diagnosis not present

## 2019-02-23 DIAGNOSIS — R279 Unspecified lack of coordination: Secondary | ICD-10-CM | POA: Diagnosis not present

## 2019-02-23 DIAGNOSIS — M25552 Pain in left hip: Secondary | ICD-10-CM | POA: Diagnosis not present

## 2019-02-23 DIAGNOSIS — R2681 Unsteadiness on feet: Secondary | ICD-10-CM | POA: Diagnosis not present

## 2019-02-23 DIAGNOSIS — M6281 Muscle weakness (generalized): Secondary | ICD-10-CM | POA: Diagnosis not present

## 2019-02-24 DIAGNOSIS — R41841 Cognitive communication deficit: Secondary | ICD-10-CM | POA: Diagnosis not present

## 2019-02-24 DIAGNOSIS — M6281 Muscle weakness (generalized): Secondary | ICD-10-CM | POA: Diagnosis not present

## 2019-02-24 DIAGNOSIS — M25552 Pain in left hip: Secondary | ICD-10-CM | POA: Diagnosis not present

## 2019-02-24 DIAGNOSIS — R279 Unspecified lack of coordination: Secondary | ICD-10-CM | POA: Diagnosis not present

## 2019-02-24 DIAGNOSIS — R2681 Unsteadiness on feet: Secondary | ICD-10-CM | POA: Diagnosis not present

## 2019-02-24 DIAGNOSIS — R488 Other symbolic dysfunctions: Secondary | ICD-10-CM | POA: Diagnosis not present

## 2019-02-26 DIAGNOSIS — M6281 Muscle weakness (generalized): Secondary | ICD-10-CM | POA: Diagnosis not present

## 2019-02-26 DIAGNOSIS — R279 Unspecified lack of coordination: Secondary | ICD-10-CM | POA: Diagnosis not present

## 2019-02-26 DIAGNOSIS — R2681 Unsteadiness on feet: Secondary | ICD-10-CM | POA: Diagnosis not present

## 2019-02-26 DIAGNOSIS — R488 Other symbolic dysfunctions: Secondary | ICD-10-CM | POA: Diagnosis not present

## 2019-02-26 DIAGNOSIS — R41841 Cognitive communication deficit: Secondary | ICD-10-CM | POA: Diagnosis not present

## 2019-02-26 DIAGNOSIS — M25552 Pain in left hip: Secondary | ICD-10-CM | POA: Diagnosis not present

## 2019-02-27 DIAGNOSIS — R488 Other symbolic dysfunctions: Secondary | ICD-10-CM | POA: Diagnosis not present

## 2019-02-27 DIAGNOSIS — M25552 Pain in left hip: Secondary | ICD-10-CM | POA: Diagnosis not present

## 2019-02-27 DIAGNOSIS — M6281 Muscle weakness (generalized): Secondary | ICD-10-CM | POA: Diagnosis not present

## 2019-02-27 DIAGNOSIS — R2681 Unsteadiness on feet: Secondary | ICD-10-CM | POA: Diagnosis not present

## 2019-02-27 DIAGNOSIS — R279 Unspecified lack of coordination: Secondary | ICD-10-CM | POA: Diagnosis not present

## 2019-02-27 DIAGNOSIS — R41841 Cognitive communication deficit: Secondary | ICD-10-CM | POA: Diagnosis not present

## 2019-02-28 DIAGNOSIS — R2681 Unsteadiness on feet: Secondary | ICD-10-CM | POA: Diagnosis not present

## 2019-02-28 DIAGNOSIS — R41841 Cognitive communication deficit: Secondary | ICD-10-CM | POA: Diagnosis not present

## 2019-02-28 DIAGNOSIS — M25552 Pain in left hip: Secondary | ICD-10-CM | POA: Diagnosis not present

## 2019-02-28 DIAGNOSIS — M6281 Muscle weakness (generalized): Secondary | ICD-10-CM | POA: Diagnosis not present

## 2019-02-28 DIAGNOSIS — R488 Other symbolic dysfunctions: Secondary | ICD-10-CM | POA: Diagnosis not present

## 2019-02-28 DIAGNOSIS — R279 Unspecified lack of coordination: Secondary | ICD-10-CM | POA: Diagnosis not present

## 2019-03-01 DIAGNOSIS — M6281 Muscle weakness (generalized): Secondary | ICD-10-CM | POA: Diagnosis not present

## 2019-03-01 DIAGNOSIS — M25552 Pain in left hip: Secondary | ICD-10-CM | POA: Diagnosis not present

## 2019-03-01 DIAGNOSIS — R41841 Cognitive communication deficit: Secondary | ICD-10-CM | POA: Diagnosis not present

## 2019-03-01 DIAGNOSIS — R279 Unspecified lack of coordination: Secondary | ICD-10-CM | POA: Diagnosis not present

## 2019-03-01 DIAGNOSIS — R488 Other symbolic dysfunctions: Secondary | ICD-10-CM | POA: Diagnosis not present

## 2019-03-01 DIAGNOSIS — R2681 Unsteadiness on feet: Secondary | ICD-10-CM | POA: Diagnosis not present

## 2019-03-05 DIAGNOSIS — R41841 Cognitive communication deficit: Secondary | ICD-10-CM | POA: Diagnosis not present

## 2019-03-05 DIAGNOSIS — R279 Unspecified lack of coordination: Secondary | ICD-10-CM | POA: Diagnosis not present

## 2019-03-05 DIAGNOSIS — M6281 Muscle weakness (generalized): Secondary | ICD-10-CM | POA: Diagnosis not present

## 2019-03-05 DIAGNOSIS — R2681 Unsteadiness on feet: Secondary | ICD-10-CM | POA: Diagnosis not present

## 2019-03-05 DIAGNOSIS — M25552 Pain in left hip: Secondary | ICD-10-CM | POA: Diagnosis not present

## 2019-03-05 DIAGNOSIS — R488 Other symbolic dysfunctions: Secondary | ICD-10-CM | POA: Diagnosis not present

## 2019-03-06 DIAGNOSIS — M6281 Muscle weakness (generalized): Secondary | ICD-10-CM | POA: Diagnosis not present

## 2019-03-06 DIAGNOSIS — R41841 Cognitive communication deficit: Secondary | ICD-10-CM | POA: Diagnosis not present

## 2019-03-06 DIAGNOSIS — R2681 Unsteadiness on feet: Secondary | ICD-10-CM | POA: Diagnosis not present

## 2019-03-06 DIAGNOSIS — R488 Other symbolic dysfunctions: Secondary | ICD-10-CM | POA: Diagnosis not present

## 2019-03-06 DIAGNOSIS — M25552 Pain in left hip: Secondary | ICD-10-CM | POA: Diagnosis not present

## 2019-03-06 DIAGNOSIS — R279 Unspecified lack of coordination: Secondary | ICD-10-CM | POA: Diagnosis not present

## 2019-03-08 ENCOUNTER — Ambulatory Visit: Payer: Medicare Other | Admitting: Internal Medicine

## 2019-03-08 ENCOUNTER — Ambulatory Visit: Payer: Medicare Other

## 2019-03-08 DIAGNOSIS — M25552 Pain in left hip: Secondary | ICD-10-CM | POA: Diagnosis not present

## 2019-03-08 DIAGNOSIS — R41841 Cognitive communication deficit: Secondary | ICD-10-CM | POA: Diagnosis not present

## 2019-03-08 DIAGNOSIS — R488 Other symbolic dysfunctions: Secondary | ICD-10-CM | POA: Diagnosis not present

## 2019-03-08 DIAGNOSIS — R2681 Unsteadiness on feet: Secondary | ICD-10-CM | POA: Diagnosis not present

## 2019-03-08 DIAGNOSIS — M6281 Muscle weakness (generalized): Secondary | ICD-10-CM | POA: Diagnosis not present

## 2019-03-08 DIAGNOSIS — R279 Unspecified lack of coordination: Secondary | ICD-10-CM | POA: Diagnosis not present

## 2019-03-12 DIAGNOSIS — M6281 Muscle weakness (generalized): Secondary | ICD-10-CM | POA: Diagnosis not present

## 2019-03-12 DIAGNOSIS — M25552 Pain in left hip: Secondary | ICD-10-CM | POA: Diagnosis not present

## 2019-03-12 DIAGNOSIS — R279 Unspecified lack of coordination: Secondary | ICD-10-CM | POA: Diagnosis not present

## 2019-03-12 DIAGNOSIS — R41841 Cognitive communication deficit: Secondary | ICD-10-CM | POA: Diagnosis not present

## 2019-03-12 DIAGNOSIS — R2681 Unsteadiness on feet: Secondary | ICD-10-CM | POA: Diagnosis not present

## 2019-03-12 DIAGNOSIS — R488 Other symbolic dysfunctions: Secondary | ICD-10-CM | POA: Diagnosis not present

## 2019-03-13 DIAGNOSIS — R41841 Cognitive communication deficit: Secondary | ICD-10-CM | POA: Diagnosis not present

## 2019-03-13 DIAGNOSIS — M25552 Pain in left hip: Secondary | ICD-10-CM | POA: Diagnosis not present

## 2019-03-13 DIAGNOSIS — R279 Unspecified lack of coordination: Secondary | ICD-10-CM | POA: Diagnosis not present

## 2019-03-13 DIAGNOSIS — R488 Other symbolic dysfunctions: Secondary | ICD-10-CM | POA: Diagnosis not present

## 2019-03-13 DIAGNOSIS — R2681 Unsteadiness on feet: Secondary | ICD-10-CM | POA: Diagnosis not present

## 2019-03-13 DIAGNOSIS — M6281 Muscle weakness (generalized): Secondary | ICD-10-CM | POA: Diagnosis not present

## 2019-03-14 DIAGNOSIS — R279 Unspecified lack of coordination: Secondary | ICD-10-CM | POA: Diagnosis not present

## 2019-03-14 DIAGNOSIS — R2681 Unsteadiness on feet: Secondary | ICD-10-CM | POA: Diagnosis not present

## 2019-03-14 DIAGNOSIS — M6281 Muscle weakness (generalized): Secondary | ICD-10-CM | POA: Diagnosis not present

## 2019-03-14 DIAGNOSIS — M25552 Pain in left hip: Secondary | ICD-10-CM | POA: Diagnosis not present

## 2019-03-14 DIAGNOSIS — R41841 Cognitive communication deficit: Secondary | ICD-10-CM | POA: Diagnosis not present

## 2019-03-14 DIAGNOSIS — R488 Other symbolic dysfunctions: Secondary | ICD-10-CM | POA: Diagnosis not present

## 2019-03-16 DIAGNOSIS — M6281 Muscle weakness (generalized): Secondary | ICD-10-CM | POA: Diagnosis not present

## 2019-03-16 DIAGNOSIS — R41841 Cognitive communication deficit: Secondary | ICD-10-CM | POA: Diagnosis not present

## 2019-03-16 DIAGNOSIS — R2681 Unsteadiness on feet: Secondary | ICD-10-CM | POA: Diagnosis not present

## 2019-03-16 DIAGNOSIS — M25552 Pain in left hip: Secondary | ICD-10-CM | POA: Diagnosis not present

## 2019-03-16 DIAGNOSIS — R279 Unspecified lack of coordination: Secondary | ICD-10-CM | POA: Diagnosis not present

## 2019-03-16 DIAGNOSIS — R488 Other symbolic dysfunctions: Secondary | ICD-10-CM | POA: Diagnosis not present

## 2019-03-19 DIAGNOSIS — R488 Other symbolic dysfunctions: Secondary | ICD-10-CM | POA: Diagnosis not present

## 2019-03-19 DIAGNOSIS — M6281 Muscle weakness (generalized): Secondary | ICD-10-CM | POA: Diagnosis not present

## 2019-03-19 DIAGNOSIS — M25552 Pain in left hip: Secondary | ICD-10-CM | POA: Diagnosis not present

## 2019-03-19 DIAGNOSIS — R2681 Unsteadiness on feet: Secondary | ICD-10-CM | POA: Diagnosis not present

## 2019-03-19 DIAGNOSIS — R279 Unspecified lack of coordination: Secondary | ICD-10-CM | POA: Diagnosis not present

## 2019-03-19 DIAGNOSIS — R41841 Cognitive communication deficit: Secondary | ICD-10-CM | POA: Diagnosis not present

## 2019-03-23 ENCOUNTER — Other Ambulatory Visit: Payer: Self-pay

## 2019-03-27 ENCOUNTER — Encounter: Payer: Self-pay | Admitting: Internal Medicine

## 2019-03-27 ENCOUNTER — Ambulatory Visit (INDEPENDENT_AMBULATORY_CARE_PROVIDER_SITE_OTHER): Payer: Medicare Other | Admitting: Internal Medicine

## 2019-03-27 ENCOUNTER — Other Ambulatory Visit: Payer: Self-pay

## 2019-03-27 VITALS — BP 120/58 | HR 88 | Temp 96.8°F | Resp 15 | Ht 63.0 in | Wt 129.0 lb

## 2019-03-27 DIAGNOSIS — F0391 Unspecified dementia with behavioral disturbance: Secondary | ICD-10-CM

## 2019-03-27 DIAGNOSIS — Z515 Encounter for palliative care: Secondary | ICD-10-CM | POA: Insufficient documentation

## 2019-03-27 DIAGNOSIS — N183 Chronic kidney disease, stage 3 unspecified: Secondary | ICD-10-CM

## 2019-03-27 DIAGNOSIS — E039 Hypothyroidism, unspecified: Secondary | ICD-10-CM | POA: Diagnosis not present

## 2019-03-27 DIAGNOSIS — F03B18 Unspecified dementia, moderate, with other behavioral disturbance: Secondary | ICD-10-CM

## 2019-03-27 DIAGNOSIS — R634 Abnormal weight loss: Secondary | ICD-10-CM

## 2019-03-27 DIAGNOSIS — Z7189 Other specified counseling: Secondary | ICD-10-CM | POA: Diagnosis not present

## 2019-03-27 MED ORDER — MIRTAZAPINE 15 MG PO TABS: ORAL_TABLET | ORAL | 2 refills | Status: DC

## 2019-03-27 NOTE — Patient Instructions (Signed)
You have lost too much weight .  You need to increase your calories   I RECOMMEND TAKING MIRTAZAPINE AT BEDTIME to improve your appetite   I recommend drinking at least one Ensure daily to boost your protein and calories    I have signed a 'Do not Resuscitate"  Order.  This order ,  If it is posted in your house,  Will prevent the medics from shocking you or performing CPR if you stop breathing or if your heart stops beating

## 2019-03-27 NOTE — Assessment & Plan Note (Addendum)
She is now taking  Exelon .  She is in assisted living .  She rarely leaves her apartment

## 2019-03-27 NOTE — Assessment & Plan Note (Signed)
Adding remeron as a trial to improve appetite.

## 2019-03-27 NOTE — Progress Notes (Signed)
Subjective:  [name], female    DOB: 08-Feb-1930  Age: 85 y.o. MRN: [id number]  CC: The primary encounter diagnosis was Stage 3 chronic kidney disease, unspecified whether stage 3a or 3b CKD. Diagnoses of Acquired hypothyroidism, Do not resuscitate discussion, Excessive body weight loss, and Moderate dementia with behavioral disturbance ([place]) were also pertinent to this visit.  HPI Alexandra Martinez presents for 6 month follow up on multiple issues including vascular dementia, CKD, chronic atrial fib on xarelto,  And hypertension,  Accompanied by niece who is primary relative and caregiver. Patient lives in [place],  Taking rivastgimine   This visit occurred during the SARS-CoV-2 public health emergency.  Safety protocols were in place, including screening questions prior to the visit, additional usage of staff PPE, and extensive cleaning of exam room while observing appropriate contact time as indicated for disinfecting solutions. c with   Ongoing weight loss noted .  Some nausea ongoing and chronic with no source found by prior workups.  Alexandra Martinez observes that patient does not  Like the food  AT [place].   Depressive symptoms reported by niece.  Discussed adding mirtazipine to lexapro  Discussed DNR orders. Patient unable to make decision due to dementia.  Alexandra Martinez has POA and agrees it would be appropriate,  DNR order signed.  Outpatient Medications Prior to Visit  Medication Sig Dispense Refill   acetaminophen (TYLENOL) 500 MG tablet Take 500 mg by mouth every 6 (six) hours as needed for headache.     cholecalciferol (VITAMIN D3) 25 MCG (1000 UT) tablet Take 1,000 Units by mouth daily.      Cranberry 1000 MG CAPS Take by mouth.     donepezil (ARICEPT) 10 MG tablet Take 10 mg by mouth at bedtime.      escitalopram (LEXAPRO) 10 MG tablet Take 1 tablet (10 mg total) by mouth daily. 90 tablet 3   esomeprazole (NEXIUM) 40 MG capsule  TAKE 1 CAPSULE BY MOUTH TWICE DAILY BEFORE MEALS 180 capsule 1   furosemide (LASIX) 20 MG tablet Take by mouth.     levothyroxine (SYNTHROID, LEVOTHROID) 50 MCG tablet TAKE 1 TABLET BY MOUTH DAILY 90 tablet 1   losartan (COZAAR) 100 MG tablet TAKE 1 TABLET BY MOUTH DAILY 90 tablet 1   metoprolol tartrate (LOPRESSOR) 100 MG tablet TAKE ONE TABLET BY MOUTH TWICE DAILY (Patient taking differently: Take 50 mg by mouth 2 (two) times daily. ) 180 tablet 1   mometasone (NASONEX) 50 MCG/ACT nasal spray Place 2 sprays into the nose daily.     pravastatin (PRAVACHOL) 20 MG tablet Take 20 mg by mouth daily.     rivastigmine (EXELON) 3 MG capsule Take 1 capsule (3 mg total) by mouth 2 (two) times daily. 180 capsule 3   XARELTO 15 MG TABS tablet Take 15 mg by mouth daily.     ondansetron (ZOFRAN) 4 MG tablet TAKE 1 TABLET BY MOUTH EVERY 8 HOURS AS NEEDED FOR NAUSEA OR VOMITING (Patient not taking: Reported on 03/27/2019) 30 tablet 0   polyethylene glycol powder (GLYCOLAX/MIRALAX) powder Take 17 g by mouth daily as needed for mild constipation. (Patient not taking: Reported on 03/27/2019) 3350 g 1   Simethicone (EQ GAS RELIEF) 125 MG CAPS Take 125 mg by mouth daily as needed (gas). Reported on 07/01/2015     traMADol (ULTRAM) 50 MG tablet Take 1 tablet (50 mg total) by mouth every 8 (eight) hours as needed. (  Patient not taking: Reported on 03/27/2019) 90 tablet 5   Facility-Administered Medications Prior to Visit  Medication Dose Route Frequency Provider Last Rate Last Dose   cyanocobalamin ((VITAMIN B-12)) injection 1,000 mcg  1,000 mcg Intramuscular Once Crecencio Mc, MD        Review of Systems;  Patient denies headache, fevers, malaise, unintentional weight loss, skin rash, eye pain, sinus congestion and sinus pain, sore throat, dysphagia,  hemoptysis , cough, dyspnea, wheezing, chest pain, palpitations, orthopnea, edema, abdominal pain, nausea, melena, diarrhea, constipation, flank pain,  dysuria, hematuria, urinary  Frequency, nocturia, numbness, tingling, seizures,  Focal weakness, Loss of consciousness,  Tremor, insomnia, depression, anxiety, and suicidal ideation.      Objective:  BP (!) 120/58 (BP Location: Left Arm, Patient Position: Sitting, Cuff Size: Normal)    Pulse 88    Temp (!) 96.8 F (36 C) (Temporal)    Resp 15    Ht 5\' 3"  (1.6 m)    Wt 129 lb (58.5 kg)    SpO2 97%    BMI 22.85 kg/m   BP Readings from Last 3 Encounters:  03/27/19 (!) 120/58  12/13/18 125/77  09/19/18 112/64    Wt Readings from Last 3 Encounters:  03/27/19 129 lb (58.5 kg)  12/13/18 132 lb (59.9 kg)  09/19/18 133 lb 12.8 oz (60.7 kg)    General appearance: alert, well groomed,  cooperative and appears stated age  Throat: lips, mucosa, and tongue normal; teeth and gums normal Neck: no adenopathy, no carotid bruit, supple, symmetrical, trachea midline and thyroid not enlarged, symmetric, no tenderness/mass/nodules Back: symmetric, no curvature. ROM normal. No CVA tenderness. Lungs: clear to auscultation bilaterally Heart: regular rate and rhythm, S1, S2 normal, no murmur, click, rub or gallop Abdomen: soft, non-tender; bowel sounds normal; no masses,  no organomegaly Pulses: 2+ and symmetric Skin: Skin color, texture, turgor normal. No rashes or lesions Lymph nodes: Cervical, supraclavicular, and axillary nodes normal. Psych: affect normal, makes good eye contact. No fidgeting,  Smiles easily.  Speech limited to simple sentence structure.  No results found for: HGBA1C  Lab Results  Component Value Date   CREATININE 1.27 (H) 09/19/2018   CREATININE 1.19 (H) 07/15/2018   CREATININE 1.38 (H) 01/25/2018    Lab Results  Component Value Date   WBC 6.1 09/19/2018   HGB 11.2 (L) 09/19/2018   HCT 32.6 (L) 09/19/2018   PLT 146.0 (L) 09/19/2018   GLUCOSE 92 09/19/2018   CHOL 217 (H) 07/16/2013   TRIG 88.0 07/16/2013   HDL 41.70 07/16/2013   LDLCALC 158 (H) 07/16/2013   ALT 7  09/19/2018   AST 14 09/19/2018   NA 138 09/19/2018   K 4.5 09/19/2018   CL 102 09/19/2018   CREATININE 1.27 (H) 09/19/2018   BUN 17 09/19/2018   CO2 30 09/19/2018   TSH 1.68 09/19/2018   INR 1.6 (H) 08/04/2015    Ct Head Wo Contrast  Result Date: 07/15/2018 CLINICAL DATA:  Fall and found down on floor with laceration to head. EXAM: CT HEAD WITHOUT CONTRAST CT CERVICAL SPINE WITHOUT CONTRAST TECHNIQUE: Multidetector CT imaging of the head and cervical spine was performed following the standard protocol without intravenous contrast. Multiplanar CT image reconstructions of the cervical spine were also generated. COMPARISON:  CT myelogram of the cervical spine on 09/03/2005 FINDINGS: CT HEAD FINDINGS Brain: Cortical atrophy and mild small vessel disease. The brain demonstrates no evidence of acute hemorrhage, infarction, edema, mass effect, extra-axial fluid collection, hydrocephalus or mass  lesion. Vascular: No hyperdense vessel or unexpected calcification. Skull: Normal. Negative for fracture or focal lesion. Sinuses/Orbits: No acute finding. Other: Scalp soft tissue injury over the left frontal region without evidence soft tissue foreign body. CT CERVICAL SPINE FINDINGS Alignment: Loss of lordosis.  No subluxation or listhesis. Skull base and vertebrae: No acute fracture identified. No focal bone lesions. Soft tissues and spinal canal: No prevertebral soft tissue swelling. No incidental mass lesions or enlarged lymph nodes identified. The visualized airway is normally patent. Disc levels: Moderate degenerative disc disease at C5-6, C6-7 and C3-4. Upper chest: Negative. IMPRESSION: 1. Left frontal scalp injury without evidence of intracranial hemorrhage or skull fracture. 2. No evidence of acute cervical spine injury. Moderate degenerative disc disease present at C5-6, C6-7 and C3-4. Electronically Signed   By: Aletta Edouard M.D.   On: 07/15/2018 09:14   Ct Cervical Spine Wo Contrast  Result Date:  07/15/2018 CLINICAL DATA:  Fall and found down on floor with laceration to head. EXAM: CT HEAD WITHOUT CONTRAST CT CERVICAL SPINE WITHOUT CONTRAST TECHNIQUE: Multidetector CT imaging of the head and cervical spine was performed following the standard protocol without intravenous contrast. Multiplanar CT image reconstructions of the cervical spine were also generated. COMPARISON:  CT myelogram of the cervical spine on 09/03/2005 FINDINGS: CT HEAD FINDINGS Brain: Cortical atrophy and mild small vessel disease. The brain demonstrates no evidence of acute hemorrhage, infarction, edema, mass effect, extra-axial fluid collection, hydrocephalus or mass lesion. Vascular: No hyperdense vessel or unexpected calcification. Skull: Normal. Negative for fracture or focal lesion. Sinuses/Orbits: No acute finding. Other: Scalp soft tissue injury over the left frontal region without evidence soft tissue foreign body. CT CERVICAL SPINE FINDINGS Alignment: Loss of lordosis.  No subluxation or listhesis. Skull base and vertebrae: No acute fracture identified. No focal bone lesions. Soft tissues and spinal canal: No prevertebral soft tissue swelling. No incidental mass lesions or enlarged lymph nodes identified. The visualized airway is normally patent. Disc levels: Moderate degenerative disc disease at C5-6, C6-7 and C3-4. Upper chest: Negative. IMPRESSION: 1. Left frontal scalp injury without evidence of intracranial hemorrhage or skull fracture. 2. No evidence of acute cervical spine injury. Moderate degenerative disc disease present at C5-6, C6-7 and C3-4. Electronically Signed   By: Aletta Edouard M.D.   On: 07/15/2018 09:14   Dg Shoulder Left  Result Date: 07/15/2018 CLINICAL DATA:  Fall.  Pain. EXAM: LEFT SHOULDER - 2+ VIEW COMPARISON:  None. FINDINGS: Mild degenerative irregularity of the acromioclavicular joint. No acute fracture or dislocation. Visualized portion of the left hemithorax is normal. IMPRESSION: No acute  osseous abnormality. Electronically Signed   By: Abigail Miyamoto M.D.   On: 07/15/2018 09:40   Dg Hip Unilat W Or Wo Pelvis 2-3 Views Left  Result Date: 07/15/2018 CLINICAL DATA:  Fall.  Left hip pain. EXAM: DG HIP (WITH OR WITHOUT PELVIS) 2-3V LEFT COMPARISON:  None. FINDINGS: Osteopenia. Lower lumbar spine fixation. Femoral heads are located. Vascular calcifications. Sacroiliac joints are symmetric. No acute fracture. IMPRESSION: No acute osseous abnormality. Electronically Signed   By: Abigail Miyamoto M.D.   On: 07/15/2018 09:41    Assessment & Plan:   Problem List Items Addressed This Visit      Unprioritized   CKD (chronic kidney disease) stage 3, GFR 30-59 ml/min - Primary   Relevant Orders   Comprehensive metabolic panel   Do not resuscitate discussion    I saw Alexandra Martinez today in the office and she appeared to be declining. Marland Kitchen  I noticed that she has lost  more weight and have asked Alexandra Martinez to keep her refrigerator stocked with protein supplement drinks,  eg the Ensure MAX PROTEIN .     Based on her current state,  I discussed CODE STATUS with her Alexandra Martinez,  Her POA . Patient is unable to articulate her end of life desires and as not created a will either .  I recommended that if Alexandra Martinez felt comfortable with decision to post the DNR in a prominent place in patient's apartment       Excessive body weight loss    Adding remeron as a trial to improve appetite.       Hypothyroid   Relevant Orders   TSH   Moderate dementia with behavioral disturbance ([place])    She is now taking  Exelon .  She is in assisted living .  She rarely leaves her apartment       Relevant Medications   donepezil (ARICEPT) 10 MG tablet   mirtazapine (REMERON) 15 MG tablet     A total of 25 minutes of face to face time was spent with patient more than half of which was spent in counselling about the above mentioned conditions  and coordination of care   I have discontinued Anique F. Grode's Simethicone, polyethylene glycol  powder, and traMADol. I am also having her start on mirtazapine. Additionally, I am having her maintain her acetaminophen, ondansetron, levothyroxine, metoprolol tartrate, pravastatin, esomeprazole, escitalopram, rivastigmine, losartan, cholecalciferol, donepezil, furosemide, Xarelto, Cranberry, and mometasone. We will continue to administer cyanocobalamin.  Meds ordered this encounter  Medications   mirtazapine (REMERON) 15 MG tablet    Sig: 1/2 to 1 tablet 30 minutes before bedtime daily    Dispense:  30 tablet    Refill:  2    Medications Discontinued During This Encounter  Medication Reason   polyethylene glycol powder (GLYCOLAX/MIRALAX) powder Patient has not taken in last 30 days   Simethicone (EQ GAS RELIEF) 125 MG CAPS Patient has not taken in last 30 days   traMADol (ULTRAM) 50 MG tablet Patient has not taken in last 30 days    Follow-up: No follow-ups on file.   Crecencio Mc, MD

## 2019-03-27 NOTE — Assessment & Plan Note (Addendum)
I saw Alexandra Martinez today in the office and she appeared to be declining. .  I noticed that she has lost  more weight and have asked [name] to keep her refrigerator stocked with protein supplement drinks,  eg the Ensure MAX PROTEIN .     Based on her current state,  I discussed CODE STATUS with her [name],  Her POA . Patient is unable to articulate her end of life desires and as not created a will either .  I recommended that if [name] felt comfortable with decision to post the DNR in a prominent place in patient's apartment

## 2019-03-28 LAB — COMPREHENSIVE METABOLIC PANEL
ALT: 5 U/L (ref 0–35)
AST: 12 U/L (ref 0–37)
Albumin: 3.9 g/dL (ref 3.5–5.2)
Alkaline Phosphatase: 65 U/L (ref 39–117)
BUN: 20 mg/dL (ref 6–23)
CO2: 29 mEq/L (ref 19–32)
Calcium: 8.8 mg/dL (ref 8.4–10.5)
Chloride: 105 mEq/L (ref 96–112)
Creatinine, Ser: 1.29 mg/dL — ABNORMAL HIGH (ref 0.40–1.20)
GFR: 39.1 mL/min — ABNORMAL LOW (ref >60.00)
Glucose, Bld: 105 mg/dL — ABNORMAL HIGH (ref 70–99)
Potassium: 3.9 mEq/L (ref 3.5–5.1)
Sodium: 141 mEq/L (ref 135–145)
Total Bilirubin: 0.5 mg/dL (ref 0.2–1.2)
Total Protein: 6.1 g/dL (ref 6.0–8.3)

## 2019-03-28 LAB — TSH: TSH: 1.69 u[IU]/mL (ref 0.35–4.50)

## 2019-04-10 ENCOUNTER — Telehealth: Payer: Self-pay | Admitting: Internal Medicine

## 2019-04-10 NOTE — Telephone Encounter (Signed)
Pt niece called back and said the medication they wanted to stop was the Mirtazapine

## 2019-04-10 NOTE — Telephone Encounter (Signed)
Pt niece called and wanted to talk about her medication list.. she thinks there is a side effect to one of her medication causing her to sleep all day  Please call pt niece at [phone]

## 2019-04-10 NOTE — Telephone Encounter (Signed)
Attempted to call niece back no answer no voicemail.

## 2019-04-11 NOTE — Telephone Encounter (Signed)
Yes, she can stop the mirtazapine  We started it for low appetite.  If they want to try doubling the dose, it may work the opposite and give her some energy,

## 2019-04-11 NOTE — Telephone Encounter (Signed)
Spoke with pt's niece Alexandra Martinez to let her know that it is okay that they stopped the pt's mirtazapine but that if they wanted to they could try doubling the dose cause it may have the opposite effect on the pt by giving her some energy. Alexandra Martinez stated that since they stopped it she did go get her some Ensure's and the pt has been drinking them.

## 2019-04-11 NOTE — Telephone Encounter (Signed)
Patient's niece called back and stated that pt has stopped Mirtazapine due to sleeping all day. Please give her a call @ [phone]

## 2019-04-23 DIAGNOSIS — R488 Other symbolic dysfunctions: Secondary | ICD-10-CM | POA: Diagnosis not present

## 2019-04-23 DIAGNOSIS — R41841 Cognitive communication deficit: Secondary | ICD-10-CM | POA: Diagnosis not present

## 2019-04-25 ENCOUNTER — Other Ambulatory Visit: Payer: Self-pay | Admitting: Internal Medicine

## 2019-04-25 DIAGNOSIS — R488 Other symbolic dysfunctions: Secondary | ICD-10-CM | POA: Diagnosis not present

## 2019-04-25 DIAGNOSIS — R41841 Cognitive communication deficit: Secondary | ICD-10-CM | POA: Diagnosis not present

## 2019-04-27 DIAGNOSIS — R488 Other symbolic dysfunctions: Secondary | ICD-10-CM | POA: Diagnosis not present

## 2019-04-27 DIAGNOSIS — R41841 Cognitive communication deficit: Secondary | ICD-10-CM | POA: Diagnosis not present

## 2019-04-30 DIAGNOSIS — R41841 Cognitive communication deficit: Secondary | ICD-10-CM | POA: Diagnosis not present

## 2019-04-30 DIAGNOSIS — R488 Other symbolic dysfunctions: Secondary | ICD-10-CM | POA: Diagnosis not present

## 2019-05-02 DIAGNOSIS — R41841 Cognitive communication deficit: Secondary | ICD-10-CM | POA: Diagnosis not present

## 2019-05-02 DIAGNOSIS — R488 Other symbolic dysfunctions: Secondary | ICD-10-CM | POA: Diagnosis not present

## 2019-05-03 ENCOUNTER — Other Ambulatory Visit: Payer: Self-pay | Admitting: Internal Medicine

## 2019-05-28 DIAGNOSIS — D631 Anemia in chronic kidney disease: Secondary | ICD-10-CM | POA: Diagnosis not present

## 2019-05-28 DIAGNOSIS — N1832 Chronic kidney disease, stage 3b: Secondary | ICD-10-CM | POA: Diagnosis not present

## 2019-05-28 DIAGNOSIS — R600 Localized edema: Secondary | ICD-10-CM | POA: Diagnosis not present

## 2019-05-28 DIAGNOSIS — I10 Essential (primary) hypertension: Secondary | ICD-10-CM | POA: Diagnosis not present

## 2019-05-28 DIAGNOSIS — N2581 Secondary hyperparathyroidism of renal origin: Secondary | ICD-10-CM | POA: Diagnosis not present

## 2019-06-22 ENCOUNTER — Telehealth: Payer: Self-pay | Admitting: Internal Medicine

## 2019-06-22 NOTE — Telephone Encounter (Signed)
Patient's niece called and wanted to know if Dr. Derrel Nip could prescribe something for patient. She has a lot of anxiety and at night she is wondering the halls. Patient lives at [facility]. Please call [name] at [phone].

## 2019-06-23 NOTE — Telephone Encounter (Signed)
paxil sent to pharmacy,  She should take it in the evenin with dinner and start with 1/2 tablet daily for the first week

## 2019-06-25 MED ORDER — ALPRAZOLAM 0.25 MG PO TABS: 0.2500 mg | ORAL_TABLET | Freq: Every day | ORAL | 1 refills | Status: DC

## 2019-06-25 NOTE — Telephone Encounter (Signed)
Spoke with [name], pt's niece to let her know of the medication change. [name] gave a verbal understanding.

## 2019-06-25 NOTE — Telephone Encounter (Signed)
Medication has not been sent in to pharmacy yet. Pt's niece would like rx sent to [facility].

## 2019-06-25 NOTE — Telephone Encounter (Signed)
Medication changed to alprazolam 0.25 mg and sent to [facility].  (Patients is already taking lexapro  So paxil won't help).

## 2019-06-29 ENCOUNTER — Emergency Department: Payer: Medicare Other

## 2019-06-29 ENCOUNTER — Other Ambulatory Visit: Payer: Self-pay

## 2019-06-29 ENCOUNTER — Emergency Department
Admission: EM | Admit: 2019-06-29 | Discharge: 2019-06-29 | Disposition: A | Discharge status: Home / Self Care | Payer: Medicare Other | Attending: Student | Admitting: Student

## 2019-06-29 ENCOUNTER — Encounter: Payer: Self-pay | Admitting: Emergency Medicine

## 2019-06-29 DIAGNOSIS — M79652 Pain in left thigh: Secondary | ICD-10-CM | POA: Insufficient documentation

## 2019-06-29 DIAGNOSIS — R52 Pain, unspecified: Secondary | ICD-10-CM | POA: Diagnosis not present

## 2019-06-29 DIAGNOSIS — S79922A Unspecified injury of left thigh, initial encounter: Secondary | ICD-10-CM | POA: Diagnosis not present

## 2019-06-29 DIAGNOSIS — W19XXXA Unspecified fall, initial encounter: Secondary | ICD-10-CM | POA: Insufficient documentation

## 2019-06-29 DIAGNOSIS — M25552 Pain in left hip: Secondary | ICD-10-CM | POA: Insufficient documentation

## 2019-06-29 DIAGNOSIS — Z7901 Long term (current) use of anticoagulants: Secondary | ICD-10-CM | POA: Insufficient documentation

## 2019-06-29 DIAGNOSIS — Y92129 Unspecified place in nursing home as the place of occurrence of the external cause: Secondary | ICD-10-CM | POA: Insufficient documentation

## 2019-06-29 DIAGNOSIS — Y939 Activity, unspecified: Secondary | ICD-10-CM | POA: Insufficient documentation

## 2019-06-29 DIAGNOSIS — E039 Hypothyroidism, unspecified: Secondary | ICD-10-CM | POA: Diagnosis not present

## 2019-06-29 DIAGNOSIS — N183 Chronic kidney disease, stage 3 unspecified: Secondary | ICD-10-CM | POA: Diagnosis not present

## 2019-06-29 DIAGNOSIS — F0390 Unspecified dementia without behavioral disturbance: Secondary | ICD-10-CM | POA: Insufficient documentation

## 2019-06-29 DIAGNOSIS — Y999 Unspecified external cause status: Secondary | ICD-10-CM | POA: Insufficient documentation

## 2019-06-29 DIAGNOSIS — I129 Hypertensive chronic kidney disease with stage 1 through stage 4 chronic kidney disease, or unspecified chronic kidney disease: Secondary | ICD-10-CM | POA: Insufficient documentation

## 2019-06-29 DIAGNOSIS — I4891 Unspecified atrial fibrillation: Secondary | ICD-10-CM | POA: Diagnosis not present

## 2019-06-29 DIAGNOSIS — M5489 Other dorsalgia: Secondary | ICD-10-CM | POA: Diagnosis not present

## 2019-06-29 DIAGNOSIS — S0990XA Unspecified injury of head, initial encounter: Secondary | ICD-10-CM | POA: Diagnosis not present

## 2019-06-29 DIAGNOSIS — S3993XA Unspecified injury of pelvis, initial encounter: Secondary | ICD-10-CM | POA: Diagnosis not present

## 2019-06-29 DIAGNOSIS — S199XXA Unspecified injury of neck, initial encounter: Secondary | ICD-10-CM | POA: Diagnosis not present

## 2019-06-29 DIAGNOSIS — Z79899 Other long term (current) drug therapy: Secondary | ICD-10-CM | POA: Diagnosis not present

## 2019-06-29 DIAGNOSIS — R0902 Hypoxemia: Secondary | ICD-10-CM | POA: Diagnosis not present

## 2019-06-29 LAB — CBC WITH DIFFERENTIAL/PLATELET
Abs Immature Granulocytes: 0.04 10*3/uL (ref 0.00–0.07)
Basophils Absolute: 0 10*3/uL (ref 0.0–0.1)
Basophils Relative: 0 %
Eosinophils Absolute: 0 10*3/uL (ref 0.0–0.5)
Eosinophils Relative: 0 %
HCT: 33.7 % — ABNORMAL LOW (ref 36.0–46.0)
Hemoglobin: 10.6 g/dL — ABNORMAL LOW (ref 12.0–15.0)
Immature Granulocytes: 0 %
Lymphocytes Relative: 9 %
Lymphs Abs: 0.9 10*3/uL (ref 0.7–4.0)
MCH: 30.8 pg (ref 26.0–34.0)
MCHC: 31.5 g/dL (ref 30.0–36.0)
MCV: 98 fL (ref 80.0–100.0)
Monocytes Absolute: 0.8 10*3/uL (ref 0.1–1.0)
Monocytes Relative: 8 %
Neutro Abs: 7.7 10*3/uL (ref 1.7–7.7)
Neutrophils Relative %: 83 %
Platelets: 114 10*3/uL — ABNORMAL LOW (ref 150–400)
RBC: 3.44 MIL/uL — ABNORMAL LOW (ref 3.87–5.11)
RDW: 12.5 % (ref 11.5–15.5)
WBC: 9.4 10*3/uL (ref 4.0–10.5)
nRBC: 0 % (ref 0.0–0.2)

## 2019-06-29 LAB — COMPREHENSIVE METABOLIC PANEL
ALT: 13 U/L (ref 0–44)
AST: 22 U/L (ref 15–41)
Albumin: 3.7 g/dL (ref 3.5–5.0)
Alkaline Phosphatase: 66 U/L (ref 38–126)
Anion gap: 10 (ref 5–15)
BUN: 20 mg/dL (ref 8–23)
CO2: 22 mmol/L (ref 22–32)
Calcium: 8.5 mg/dL — ABNORMAL LOW (ref 8.9–10.3)
Chloride: 106 mmol/L (ref 98–111)
Creatinine, Ser: 1 mg/dL (ref 0.44–1.00)
GFR calc Af Amer: 59 mL/min — ABNORMAL LOW (ref >60)
GFR calc non Af Amer: 51 mL/min — ABNORMAL LOW (ref >60)
Glucose, Bld: 97 mg/dL (ref 70–99)
Potassium: 4.2 mmol/L (ref 3.5–5.1)
Sodium: 138 mmol/L (ref 135–145)
Total Bilirubin: 0.9 mg/dL (ref 0.3–1.2)
Total Protein: 6.6 g/dL (ref 6.5–8.1)

## 2019-06-29 LAB — URINALYSIS, COMPLETE (UACMP) WITH MICROSCOPIC
Bacteria, UA: NONE SEEN
Bilirubin Urine: NEGATIVE
Glucose, UA: NEGATIVE mg/dL
Hgb urine dipstick: NEGATIVE
Ketones, ur: NEGATIVE mg/dL
Leukocytes,Ua: NEGATIVE
Nitrite: NEGATIVE
Protein, ur: NEGATIVE mg/dL
Specific Gravity, Urine: 1.01 (ref 1.005–1.030)
pH: 7 (ref 5.0–8.0)

## 2019-06-29 LAB — CK: Total CK: 68 U/L (ref 38–234)

## 2019-06-29 NOTE — ED Provider Notes (Signed)
[facility] Emergency Department Provider Note  ____________________________________________   First MD Initiated Contact with Patient 06/29/19 [phone]     (approximate)  I have reviewed the triage vital signs and the nursing notes.  History  Chief Complaint Fall    HPI Alexandra Martinez is a 86 y.o. female PMHx as below, including dementia, AF on anticoagulation, who presents for a fall. Circumstances surrounding fall lack complete details due to her dementia.   However, she states she fell sometime last night and could not get back. This morning when staff at her living facility came for morning checks they found her on the ground (this portion confirmed by family member at bedside).   Patient denies any head injury or LOC. She primarily complains of pain to the general left hip and left thigh area. She describes as aching, 5/10, constant since onset, no radiation, no alleviating/aggravating components.   Denies any pain elsewhere.    Past Medical Hx Past Medical History:  Diagnosis Date  . Anemia    r/t uterine fibroids  . Atrial fib/flutter, transient    [facility] admission for RVR 03/2009  . DDD (degenerative disc disease)   . Dysrhythmia    A-FIB  . GERD (gastroesophageal reflux disease)   . H/O bladder infections   . H/O hiatal hernia   . H/O thyroid nodule    surgically removed  . Heart murmur    moderate MR/TR 08/2013 echo [name])  . Hemorrhoids   . Hiatal hernia with gastroesophageal reflux 2008   by EGD,  [name]  . High cholesterol   . History of uterine fibroid   . Hydronephrosis    uretero- with congenital UPJ obstruction- Dr. Edrick Oh  . Hydronephrosis with ureteropelvic junction obstruction   . Hypertension    [facility]  . Hypothyroidism   . Nocturia   . Osteoporosis   . Polyuria   . Recurrent UTI     Problem List Patient Active Problem List   Diagnosis Date Noted  . Do not resuscitate discussion 03/27/2019    . History of fall within past 90 days 09/20/2018  . Hallucinations due to late onset dementia ([place]) 09/20/2018  . GAD (generalized anxiety disorder) 02/04/2018  . Venous insufficiency (chronic) (peripheral) 02/04/2018  . CKD (chronic kidney disease) stage 3, GFR 30-59 ml/min 09/18/2017  . Moderate dementia with behavioral disturbance ([place]) 06/14/2017  . Essential hypertension 06/14/2017  . B12 deficiency 09/18/2016  . Anemia 08/05/2015  . Vitamin D deficiency 08/05/2015  . Constipation 02/04/2015  . Left knee pain 01/05/2015  . Status post laminectomy with spinal fusion 02/23/2014  . Excessive body weight loss 02/23/2014  . Chronic lower back pain 08/12/2012  . Urge urinary incontinence 08/12/2012  . Personal history of colonic polyps 08/12/2012  . Seasonal rhinitis 01/16/2012  . Hiatal hernia with gastroesophageal reflux   . Fatigue 09/16/2011  . Controlled atrial fibrillation ([place]) 12/15/2010  . Irritable bowel syndrome without diarrhea 12/15/2010  . Hypothyroid 12/15/2010  . Long term current use of anticoagulant therapy 12/15/2010    Past Surgical Hx Past Surgical History:  Procedure Laterality Date  . ADENOIDECTOMY N/A 01/05/2016   Procedure: ADENOIDECTOMY;  Surgeon: Margaretha Sheffield, MD;  Location: [facility] ORS;  Service: ENT;  Laterality: N/A;  . BACK SURGERY    . CATARACT EXTRACTION     left  . CERVICAL DISC SURGERY  1980's  . CHOLECYSTECTOMY    . NASOPHARYNGOSCOPY  01/05/2016   Procedure: NASOPHARYNGOSCOPY;  Surgeon: Margaretha Sheffield,  MD;  Location: [facility] ORS;  Service: ENT;;  . THYROID LOBECTOMY     partial, right lobe  . TOTAL ABDOMINAL HYSTERECTOMY W/ BILATERAL SALPINGOOPHORECTOMY      Medications Prior to Admission medications   Medication Sig Start Date End Date Taking? Authorizing Provider  acetaminophen (TYLENOL) 500 MG tablet Take 500 mg by mouth every 6 (six) hours as needed for headache.    [provider]  ALPRAZolam [name]) 0.25 MG tablet Take 1 tablet  (0.25 mg total) by mouth at bedtime. 06/25/19   Crecencio Mc, MD  cholecalciferol (VITAMIN D3) 25 MCG (1000 UT) tablet Take 1,000 Units by mouth daily.     [provider]  Cranberry 1000 MG CAPS Take by mouth.    [provider]  donepezil (ARICEPT) 10 MG tablet Take 10 mg by mouth at bedtime.  09/07/17   [provider]  escitalopram (LEXAPRO) 10 MG tablet Take 1 tablet (10 mg total) by mouth daily. 12/13/18   Cameron Sprang, MD  esomeprazole (NEXIUM) 40 MG capsule TAKE 1 CAPSULE BY MOUTH TWICE DAILY BEFORE MEALS 10/02/18   Crecencio Mc, MD  furosemide (LASIX) 20 MG tablet Take by mouth. 06/16/16   [provider]  levothyroxine (SYNTHROID) 50 MCG tablet TAKE ONE TABLET EVERY DAY 04/26/19   Crecencio Mc, MD  losartan (COZAAR) 100 MG tablet TAKE 1 TABLET BY MOUTH DAILY 05/03/19   Crecencio Mc, MD  metoprolol tartrate (LOPRESSOR) 100 MG tablet TAKE ONE TABLET BY MOUTH TWICE DAILY Patient taking differently: Take 50 mg by mouth 2 (two) times daily.  06/26/18   Crecencio Mc, MD  mirtazapine (REMERON) 15 MG tablet 1/2 to 1 tablet 30 minutes before bedtime daily 03/27/19   Crecencio Mc, MD  mometasone (NASONEX) 50 MCG/ACT nasal spray Place 2 sprays into the nose daily.    [provider]  ondansetron (ZOFRAN) 4 MG tablet TAKE 1 TABLET BY MOUTH EVERY 8 HOURS AS NEEDED FOR NAUSEA OR VOMITING Patient not taking: Reported on 03/27/2019 06/20/17   Crecencio Mc, MD  pravastatin (PRAVACHOL) 20 MG tablet Take 20 mg by mouth daily. 07/10/18   [provider]  rivastigmine (EXELON) 3 MG capsule Take 1 capsule (3 mg total) by mouth 2 (two) times daily. 12/13/18   Cameron Sprang, MD  XARELTO 15 MG TABS tablet Take 15 mg by mouth daily. 03/03/19   [provider]    Allergies Celebrex [celecoxib], Donepezil, Loratadine-pseudoephedrine er, Venlafaxine, Vioxx [rofecoxib], Codeine, Dexilant [dexlansoprazole], and Eliquis [apixaban]  Family  Hx Family History  Problem Relation Age of Onset  . Heart disease Mother   . Coronary artery disease Father        MI's    Social Hx Social History   Tobacco Use  . Smoking status: Never Smoker  . Smokeless tobacco: Never Used  Substance Use Topics  . Alcohol use: No  . Drug use: No     Review of Systems  Constitutional: Negative for fever, chills. + fall Eyes: Negative for visual changes. ENT: Negative for sore throat. Cardiovascular: Negative for chest pain. Respiratory: Negative for shortness of breath. Gastrointestinal: Negative for nausea, vomiting.  Genitourinary: Negative for dysuria. Musculoskeletal: Negative for leg swelling. + L hip/thigh pain Skin: Negative for rash. Neurological: Negative for headaches.   Physical Exam  Vital Signs: ED Triage Vitals [06/29/19 0925]  Enc Vitals Group     BP (!) 164/97     Pulse Rate 80  Resp 18     Temp 99.2 F (37.3 C)     Temp Source Oral     SpO2 91 %     Weight 128 lb 15.5 oz (58.5 kg)     Height 5\' 6"  (1.676 m)     Head Circumference      Peak Flow      Pain Score 5     Pain Loc      Pain Edu?      Excl. in [place]?     Constitutional: Pleasantly demented. Well appearing. Grand-niece at bedside.  Head: Normocephalic. Atraumatic. Eyes: Conjunctivae clear, sclera anicteric. Pupils equal and symmetric. Nose: No masses or lesions. No congestion or rhinorrhea. No epistaxis.  Mouth/Throat: Wearing mask.  Neck: No stridor. Trachea midline. No midline CS tenderness.  Cardiovascular: Normal rate, regular rhythm. Extremities well perfused. Chest: Chest wall stable, NT, no step offs, no palpable crepitus.  Respiratory: Normal respiratory effort.  Lungs CTAB. Oxygen dips to upper 80s when asleep, recovers when awake without issue.  Gastrointestinal: Soft. Non-distended. Non-tender.  Pelvis: Stable with AP and lateral compression. FROM bilateral hips w/o discomfort. Non tender about the hip joint. Very minimal  discomfort to palpation of the anterior mid thigh. No deformities or bruising. No shortening or rotation or limb length discrepancy.  Genitourinary: Deferred. Musculoskeletal: No lower extremity edema. No deformities. FROM bilateral shoulders, elbows, wrists, hips, knees, ankles.  Neurologic:  Baseline dementia. No gross focal or lateralizing neurologic deficits are appreciated.  Skin: Skin is warm, dry and intact. No rash noted. No ecchymosis, lacerations, or abrasions.  Psychiatric: Mood and affect are appropriate for situation.  EKG  Personally reviewed and interpreted by myself.   Rate: 87 Rhythm: irregular, atrial fibrillation Axis: normal Intervals: WNl AF, rate controlled No STEMI    Radiology  CT head/CS IMPRESSION:  1. No evidence of acute intracranial or cervical spine injury.  2. Atrophy in keeping with history of dementia.   Femur XR:  IMPRESSION:  No acute bony findings.   Pelvis XR:  IMPRESSION:  No acute bony findings.    Procedures  Procedure(s) performed (including critical care):  Procedures   Initial Impression / Assessment and Plan / MDM / ED Course  86 y.o. female who presents to the ED for a fall, history and exam as above.   Imaging obtained negative for any acute traumatic injuries. EKG shows AF, rate controlled, consistent with her history. Remainder of labs w/o actionable derangements including normal CK, no evidence of UTI. She has ambulated in ED with steady gait. As such, she is stable for d/c with outpatient follow up and supportive care. Patient and family agreeable w/ plan. Given return precautions.   _______________________________  As part of my medical decision making I have reviewed available labs, radiology tests, reviewed old records, obtained additional history from family.  Final Clinical Impression(s) / ED Diagnosis  Final diagnoses:  Fall, initial encounter       Note:  This document was prepared using Dragon  voice recognition software and may include unintentional dictation errors.   Lilia Pro., MD 06/30/19 [phone]

## 2019-06-29 NOTE — Discharge Instructions (Addendum)
Thank you for letting us take care of you in the emergency department today.  ° °Please continue to take any regular, prescribed medications.  ° °Please follow up with: °- Your primary care doctor to review your ER visit and follow up on your symptoms.  ° °Please return to the ER for any new or worsening symptoms.  ° °

## 2019-06-29 NOTE — ED Notes (Signed)
Pt ambulatory to BR with 2 person assist at this time.  Pt favors left hip, but able to ambulate.  Pt to radiology at this time.

## 2019-06-29 NOTE — ED Triage Notes (Signed)
Pt to ER with c/o falling with left hip pain. Pt has baseline dementia and does not remember when she fell or how. Per EMS appeared to be mechanical.  Pt has not c/o headache, dizziness or chest pain.  Upon arrival pt states to this RN that the fall happened this AM, that she tripped over her shoe after getting up from bed and denies hitting head.

## 2019-07-02 ENCOUNTER — Telehealth: Payer: Self-pay | Admitting: Internal Medicine

## 2019-07-02 DIAGNOSIS — R279 Unspecified lack of coordination: Secondary | ICD-10-CM | POA: Diagnosis not present

## 2019-07-02 DIAGNOSIS — R488 Other symbolic dysfunctions: Secondary | ICD-10-CM | POA: Diagnosis not present

## 2019-07-02 DIAGNOSIS — M6281 Muscle weakness (generalized): Secondary | ICD-10-CM | POA: Diagnosis not present

## 2019-07-02 DIAGNOSIS — R41841 Cognitive communication deficit: Secondary | ICD-10-CM | POA: Diagnosis not present

## 2019-07-02 NOTE — Telephone Encounter (Signed)
Agree with stopping alprazolam.  She will need a virtual OV to provide more information about nighttime acitivites before I will prescribe anything else.

## 2019-07-02 NOTE — Telephone Encounter (Signed)
Patient Niece called Access Nurse on 06/30/19 and stated patient has fallen Twice since she was started on Alprazolam 0.25 mg once on 06/28/19 and again on 06/29/19, patient was seen at ER for second fall on 06/29/19. No evident injury,. On the alprazolam patient niece stated that  Patient cannot walk and is to groggy on this medication and would like to stop this medication due to fall risk. Niece states that ER recommended stopping mediation. [place] Night - Cl TELEPHONE ADVICE RECORD AccessNurse Patient Name: Alexandra Martinez Gender: Female DOB: 1929-09-19 Age: 86 Y 2 M 19 D Return Phone Number: [phone] (Primary) Address: City/State/Zip: [name] Client [name] [place] Night - Cl Client Site [place] Physician Deborra Medina - MD Contact Type Call Who Is Calling Patient / Member / Family / Caregiver Call Type Triage / Clinical Caller Name [name] Relationship To Patient Other relative Return Phone Number [phone] (Primary) Chief Complaint Medication reaction Reason for Call Symptomatic / Request for [place] states her aunt has been put on Zanex but it is too strong. She has fallen and acting incoherent. Translation No Nurse Assessment Nurse: Rolena Infante, RN, Patrice Date/Time (Eastern Time): 06/30/2019 9:30:40 AM Confirm and document reason for call. If symptomatic, describe symptoms. ---Speaking with caregiver at present. Pt history of falls X2. Has the patient had close contact with a person known or suspected to have the novel coronavirus illness OR traveled / lives in area with major community spread (including international travel) in the last 14 days from the onset of symptoms? * If Asymptomatic, screen for exposure and travel within the last 14 days. ---No Does the patient have any new or worsening symptoms? ---Yes Will a triage be  completed? ---Yes Related visit to physician within the last 2 weeks? ---No Does the PT have any chronic conditions? (i.e. diabetes, asthma, this includes High risk factors for pregnancy, etc.) ---Unknown Is this a behavioral health or substance abuse call? ---No Nurse: Rolena Infante, RN, Patrice Date/Time (Eastern Time): 06/30/2019 9:02:13 AM Confirm and document reason for call. If symptomatic, describe symptoms. ---Caller states her Aunt was prescribed Xanax and has fallen twice since starting the medication. She is in independent living . Caller not with patient. She states is confused. She does not want to give her Xanax again. To get an number where I can contact person with patient. Niece gave me care giver name Nanda Quinton [phone] No answer. 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 Was seen yesterday at ED for fall. Caller states found patient in the floor this am. Pt sitting in chair at present. Some confusion. States was getting out of her bed. Fall was seen. EMS seen this am. No Diagnosis. C/o back hurting.PLEASE NOTE: All timestamps contained within this report are represented as [place] Standard Time. CONFIDENTIALTY NOTICE: This fax transmission is intended only for the addressee. It contains information that is legally privileged, confidential or otherwise protected from use or disclosure. If you are not the intended recipient, you are strictly prohibited from reviewing, disclosing, copying using or disseminating any of this information or taking any action in reliance on or regarding this information. If you have received this fax in error, please notify us immediately by telephone so that we can arrange for its return to [place]. Phone: [phone], Toll-Free: [phone], Fax: [phone] Page: 2 of 3 Call Id: [id number] Nurse Assessment Has the patient had close contact with a person  known or suspected to have the novel coronavirus illness OR traveled / lives in area with  major community spread (including international travel) in the last 14 days from the onset of symptoms? * If Asymptomatic, screen for exposure and travel within the last 14 days. ---No Does the patient have any new or worsening symptoms? ---Yes Will a triage be completed? ---Yes Related visit to physician within the last 2 weeks? ---Yes Does the PT have any chronic conditions? (i.e. diabetes, asthma, this includes High risk factors for pregnancy, etc.) ---Unknown Is this a behavioral health or substance abuse call? ---No Guidelines Guideline Title Affirmed Question Affirmed Notes Nurse Date/Time (Eastern Time) Confusion - Delirium [1] Difficult to awaken or acting confused (e.g., disoriented, slurred speech) AND [2] present now AND [3] new onset Rolena Infante, RN, Sharl Ma 06/30/2019 9:31:55 AM Disp. Time [name] Time) Disposition Final User 06/30/2019 9:18:26 AM Attempt made - message left Aldine Contes 06/30/2019 9:42:40 AM 911 Outcome Documentation Rolena Infante, RN, Patrice Reason: 911 outcome. Caregiver states I would need to check with [name] to see what she wants to do Spoke with [name] about 911 outoutcome States she is going over to her Aunt and will decide what to do. Not sure if wants to take her back to hospital. Verbalized Understanding. 06/30/2019 9:39:04 AM Call EMS 911 Now Yes Rolena Infante, RN, Patrice Caller Disagree/Comply Disagree Caller Understands Yes PreDisposition Did not know what to do Care Advice Given Per Guideline CALL EMS 911 NOW: * Immediate medical attention is needed. You need to hang up and call 911 (or an ambulance). CARE ADVICE given per Confusion-Delirium (Adult) guideline

## 2019-07-03 DIAGNOSIS — R41841 Cognitive communication deficit: Secondary | ICD-10-CM | POA: Diagnosis not present

## 2019-07-03 DIAGNOSIS — M6281 Muscle weakness (generalized): Secondary | ICD-10-CM | POA: Diagnosis not present

## 2019-07-03 DIAGNOSIS — R279 Unspecified lack of coordination: Secondary | ICD-10-CM | POA: Diagnosis not present

## 2019-07-03 DIAGNOSIS — R488 Other symbolic dysfunctions: Secondary | ICD-10-CM | POA: Diagnosis not present

## 2019-07-04 ENCOUNTER — Other Ambulatory Visit: Payer: Self-pay | Admitting: Internal Medicine

## 2019-07-04 DIAGNOSIS — R488 Other symbolic dysfunctions: Secondary | ICD-10-CM | POA: Diagnosis not present

## 2019-07-04 DIAGNOSIS — M6281 Muscle weakness (generalized): Secondary | ICD-10-CM | POA: Diagnosis not present

## 2019-07-04 DIAGNOSIS — R279 Unspecified lack of coordination: Secondary | ICD-10-CM | POA: Diagnosis not present

## 2019-07-04 DIAGNOSIS — R41841 Cognitive communication deficit: Secondary | ICD-10-CM | POA: Diagnosis not present

## 2019-07-05 DIAGNOSIS — R279 Unspecified lack of coordination: Secondary | ICD-10-CM | POA: Diagnosis not present

## 2019-07-05 DIAGNOSIS — R488 Other symbolic dysfunctions: Secondary | ICD-10-CM | POA: Diagnosis not present

## 2019-07-05 DIAGNOSIS — R41841 Cognitive communication deficit: Secondary | ICD-10-CM | POA: Diagnosis not present

## 2019-07-05 DIAGNOSIS — M6281 Muscle weakness (generalized): Secondary | ICD-10-CM | POA: Diagnosis not present

## 2019-07-06 DIAGNOSIS — R41841 Cognitive communication deficit: Secondary | ICD-10-CM | POA: Diagnosis not present

## 2019-07-06 DIAGNOSIS — M6281 Muscle weakness (generalized): Secondary | ICD-10-CM | POA: Diagnosis not present

## 2019-07-06 DIAGNOSIS — R279 Unspecified lack of coordination: Secondary | ICD-10-CM | POA: Diagnosis not present

## 2019-07-06 DIAGNOSIS — R488 Other symbolic dysfunctions: Secondary | ICD-10-CM | POA: Diagnosis not present

## 2019-07-09 DIAGNOSIS — R41841 Cognitive communication deficit: Secondary | ICD-10-CM | POA: Diagnosis not present

## 2019-07-09 DIAGNOSIS — R488 Other symbolic dysfunctions: Secondary | ICD-10-CM | POA: Diagnosis not present

## 2019-07-09 DIAGNOSIS — R279 Unspecified lack of coordination: Secondary | ICD-10-CM | POA: Diagnosis not present

## 2019-07-09 DIAGNOSIS — M6281 Muscle weakness (generalized): Secondary | ICD-10-CM | POA: Diagnosis not present

## 2019-07-10 ENCOUNTER — Telehealth: Payer: Self-pay | Admitting: Internal Medicine

## 2019-07-10 DIAGNOSIS — R488 Other symbolic dysfunctions: Secondary | ICD-10-CM | POA: Diagnosis not present

## 2019-07-10 DIAGNOSIS — R41841 Cognitive communication deficit: Secondary | ICD-10-CM | POA: Diagnosis not present

## 2019-07-10 DIAGNOSIS — R279 Unspecified lack of coordination: Secondary | ICD-10-CM | POA: Diagnosis not present

## 2019-07-10 DIAGNOSIS — M6281 Muscle weakness (generalized): Secondary | ICD-10-CM | POA: Diagnosis not present

## 2019-07-10 NOTE — Telephone Encounter (Signed)
[name] called and wanted to see if Dr. Derrel Nip would put in a MRI for pt to have since she still can't walk well from when she fell 3/12 and 3/13

## 2019-07-11 NOTE — Telephone Encounter (Signed)
Patient's POA, [name] called about note below.

## 2019-07-12 NOTE — Telephone Encounter (Signed)
Spoke with pt's niece [name] and I have scheduled pt an in office appt for 07/17/2019 at 1:30pm to discuss the need for something to help her at night and to discuss the falls.

## 2019-07-12 NOTE — Telephone Encounter (Signed)
Pt has not followed up with you since she fell on both 06/28/2019 and 06/29/2019. I have spoke with [name] and scheduled the pt for an in office appt on 07/17/2019 at 1:30pm.

## 2019-07-13 ENCOUNTER — Ambulatory Visit: Payer: Medicare Other | Admitting: Neurology

## 2019-07-13 DIAGNOSIS — R488 Other symbolic dysfunctions: Secondary | ICD-10-CM | POA: Diagnosis not present

## 2019-07-13 DIAGNOSIS — R279 Unspecified lack of coordination: Secondary | ICD-10-CM | POA: Diagnosis not present

## 2019-07-13 DIAGNOSIS — M6281 Muscle weakness (generalized): Secondary | ICD-10-CM | POA: Diagnosis not present

## 2019-07-13 DIAGNOSIS — R41841 Cognitive communication deficit: Secondary | ICD-10-CM | POA: Diagnosis not present

## 2019-07-15 DIAGNOSIS — M6281 Muscle weakness (generalized): Secondary | ICD-10-CM | POA: Diagnosis not present

## 2019-07-15 DIAGNOSIS — R41841 Cognitive communication deficit: Secondary | ICD-10-CM | POA: Diagnosis not present

## 2019-07-15 DIAGNOSIS — R279 Unspecified lack of coordination: Secondary | ICD-10-CM | POA: Diagnosis not present

## 2019-07-15 DIAGNOSIS — R488 Other symbolic dysfunctions: Secondary | ICD-10-CM | POA: Diagnosis not present

## 2019-07-17 ENCOUNTER — Other Ambulatory Visit: Payer: Self-pay

## 2019-07-17 ENCOUNTER — Encounter: Payer: Self-pay | Admitting: Internal Medicine

## 2019-07-17 ENCOUNTER — Ambulatory Visit (INDEPENDENT_AMBULATORY_CARE_PROVIDER_SITE_OTHER): Payer: Medicare Other | Admitting: Internal Medicine

## 2019-07-17 VITALS — BP 118/68 | HR 71 | Temp 97.9°F | Resp 16 | Ht 66.0 in | Wt 127.8 lb

## 2019-07-17 DIAGNOSIS — R634 Abnormal weight loss: Secondary | ICD-10-CM | POA: Diagnosis not present

## 2019-07-17 DIAGNOSIS — F03B18 Unspecified dementia, moderate, with other behavioral disturbance: Secondary | ICD-10-CM

## 2019-07-17 DIAGNOSIS — F0391 Unspecified dementia with behavioral disturbance: Secondary | ICD-10-CM | POA: Diagnosis not present

## 2019-07-17 DIAGNOSIS — R279 Unspecified lack of coordination: Secondary | ICD-10-CM | POA: Diagnosis not present

## 2019-07-17 DIAGNOSIS — Z9181 History of falling: Secondary | ICD-10-CM | POA: Diagnosis not present

## 2019-07-17 DIAGNOSIS — R41841 Cognitive communication deficit: Secondary | ICD-10-CM | POA: Diagnosis not present

## 2019-07-17 DIAGNOSIS — R488 Other symbolic dysfunctions: Secondary | ICD-10-CM | POA: Diagnosis not present

## 2019-07-17 DIAGNOSIS — M6281 Muscle weakness (generalized): Secondary | ICD-10-CM | POA: Diagnosis not present

## 2019-07-17 MED ORDER — MIRTAZAPINE 15 MG PO TABS: 7.5000 mg | ORAL_TABLET | Freq: Every day | ORAL | 5 refills | Status: DC

## 2019-07-17 MED ORDER — ACETAMINOPHEN 500 MG PO TABS: 1000.0000 mg | ORAL_TABLET | Freq: Two times a day (BID) | ORAL | 1 refills | Status: DC

## 2019-07-17 NOTE — Progress Notes (Signed)
Subjective:  [name], female    DOB: 04/03/1930  Age: 86 y.o. MRN: [id number]  CC: The primary encounter diagnosis was Moderate dementia with behavioral disturbance ([place]). Diagnoses of Excessive body weight loss and History of fall within past 90 days were also pertinent to this visit.  HPI Alexandra Martinez presents for follow up on dementia with behavioral distrubances   This visit occurred during the SARS-CoV-2 public health emergency.  Safety protocols were in place, including screening questions prior to the visit, additional usage of staff PPE, and extensive cleaning of exam room while observing appropriate contact time as indicated for disinfecting solutions.    Patient is accompanied by her niece who is her only remaining relative and guardian.  She is a resident of [facility], but has been having difficulty due to progressive dementia .  She is not sleeping well .  Facility requested medication for nighttime agitation.  Low dose alprazolam was given 0.25 mg which was not tolerated due to  recurrent falls, one recently which resultedin an ER visit  For left hip pain.  Films reviewed: there were no fractures of femur or pelvis . No bruising was noted.  She reports persistent left femur pain that is relieved with  Use of tylenol bBut does not like to take it.    Per niece she spends most of the day sleeping,  Avoids watching TV because of paranoid delusions (they are talking to her ) TV off .  Then spends most of the night awake.  Staff has reported that she has been found walking down the hallway  In the  middle of the night.  Review of MAR reveals that mirtazapine was discontinued over a month ago for unclear reasons.   Niece is requesting assistance from social work for help in placing patient in an assisted living facility.  Patient is  losing weight due to poor appetite.  likes ensure   Outpatient Medications Prior to Visit  Medication Sig Dispense  Refill  . cholecalciferol (VITAMIN D3) 25 MCG (1000 UT) tablet Take 1,000 Units by mouth daily.     . Cranberry 1000 MG CAPS Take by mouth.    . donepezil (ARICEPT) 10 MG tablet Take 10 mg by mouth at bedtime.     Marland Kitchen escitalopram (LEXAPRO) 10 MG tablet Take 1 tablet (10 mg total) by mouth daily. 90 tablet 3  . esomeprazole (NEXIUM) 40 MG capsule TAKE 1 CAPSULE TWICE DAILY BEFORE MEALS 180 capsule 0  . furosemide (LASIX) 20 MG tablet Take by mouth.    . levothyroxine (SYNTHROID) 50 MCG tablet TAKE ONE TABLET EVERY DAY 90 tablet 1  . losartan (COZAAR) 100 MG tablet TAKE 1 TABLET BY MOUTH DAILY 90 tablet 1  . metoprolol tartrate (LOPRESSOR) 100 MG tablet TAKE ONE TABLET BY MOUTH TWICE DAILY (Patient taking differently: Take 50 mg by mouth 2 (two) times daily. ) 180 tablet 1  . mometasone (NASONEX) 50 MCG/ACT nasal spray Place 2 sprays into the nose daily.    . ondansetron (ZOFRAN) 4 MG tablet TAKE 1 TABLET BY MOUTH EVERY 8 HOURS AS NEEDED FOR NAUSEA OR VOMITING 30 tablet 0  . pravastatin (PRAVACHOL) 20 MG tablet Take 20 mg by mouth daily.    . rivastigmine (EXELON) 3 MG capsule Take 1 capsule (3 mg total) by mouth 2 (two) times daily. 180 capsule 3  . XARELTO 15 MG TABS tablet Take 15 mg by mouth daily.    Marland Kitchen  acetaminophen (TYLENOL) 500 MG tablet Take 500 mg by mouth every 6 (six) hours as needed for headache.    . mirtazapine (REMERON) 15 MG tablet 1/2 to 1 tablet 30 minutes before bedtime daily 30 tablet 2   Facility-Administered Medications Prior to Visit  Medication Dose Route Frequency Provider Last Rate Last Admin  . cyanocobalamin ((VITAMIN B-12)) injection 1,000 mcg  1,000 mcg Intramuscular Once Crecencio Mc, MD        Review of Systems;  Patient denies headache, fevers, malaise, unintentional weight loss, skin rash, eye pain, sinus congestion and sinus pain, sore throat, dysphagia,  hemoptysis , cough, dyspnea, wheezing, chest pain, palpitations, orthopnea, edema, abdominal pain,  nausea, melena, diarrhea, constipation, flank pain, dysuria, hematuria, urinary  Frequency, nocturia, numbness, tingling, seizures,  Focal weakness, Loss of consciousness,  Tremor, insomnia, depression, anxiety, and suicidal ideation.      Objective:  BP 118/68 (BP Location: Left Arm, Patient Position: Sitting, Cuff Size: Normal)   Pulse 71   Temp 97.9 F (36.6 C) (Temporal)   Resp 16   Ht 5\' 6"  (1.676 m)   Wt 127 lb 12.8 oz (58 kg)   SpO2 96%   BMI 20.63 kg/m   BP Readings from Last 3 Encounters:  07/17/19 118/68  06/29/19 (!) 148/86  03/27/19 (!) 120/58    Wt Readings from Last 3 Encounters:  07/17/19 127 lb 12.8 oz (58 kg)  06/29/19 128 lb 15.5 oz (58.5 kg)  03/27/19 129 lb (58.5 kg)    General appearance: alert, cooperative and appears stated age Ears: normal TM's and external ear canals both ears Throat: lips, mucosa, and tongue normal; teeth and gums normal Neck: no adenopathy, no carotid bruit, supple, symmetrical, trachea midline and thyroid not enlarged, symmetric, no tenderness/mass/nodules Back: symmetric, no curvature. ROM normal. No CVA tenderness. Lungs: clear to auscultation bilaterally Heart: regular rate and rhythm, S1, S2 normal, no murmur, click, rub or gallop Abdomen: soft, non-tender; bowel sounds normal; no masses,  no organomegaly Pulses: 2+ and symmetric Skin: Skin color, texture, turgor normal. No rashes or lesions Lymph nodes: Cervical, supraclavicular, and axillary nodes normal.  No results found for: HGBA1C  Lab Results  Component Value Date   CREATININE 1.00 06/29/2019   CREATININE 1.29 (H) 03/27/2019   CREATININE 1.27 (H) 09/19/2018    Lab Results  Component Value Date   WBC 9.4 06/29/2019   HGB 10.6 (L) 06/29/2019   HCT 33.7 (L) 06/29/2019   PLT 114 (L) 06/29/2019   GLUCOSE 97 06/29/2019   CHOL 217 (H) 07/16/2013   TRIG 88.0 07/16/2013   HDL 41.70 07/16/2013   LDLCALC 158 (H) 07/16/2013   ALT 13 06/29/2019   AST 22 06/29/2019    NA 138 06/29/2019   K 4.2 06/29/2019   CL 106 06/29/2019   CREATININE 1.00 06/29/2019   BUN 20 06/29/2019   CO2 22 06/29/2019   TSH 1.69 03/27/2019   INR 1.6 (H) 08/04/2015    DG Pelvis 1-2 Views  Result Date: 06/29/2019 CLINICAL DATA:  [place]. Left hip and pelvic pain. EXAM: PELVIS - 1-2 VIEW COMPARISON:  None. FINDINGS: Both hips are normally located. No acute hip fracture. The pubic symphysis and SI joints are intact. No definite pelvic fractures. IMPRESSION: No acute bony findings. Electronically Signed   By: Marijo Sanes M.D.   On: 06/29/2019 10:26   CT Head Wo Contrast  Result Date: 06/29/2019 CLINICAL DATA:  FALL.  DEMENTIA. EXAM: CT HEAD WITHOUT CONTRAST CT CERVICAL SPINE WITHOUT  CONTRAST TECHNIQUE: Multidetector CT imaging of the head and cervical spine was performed following the standard protocol without intravenous contrast. Multiplanar CT image reconstructions of the cervical spine were also generated. COMPARISON:  09/14/2018 FINDINGS: CT HEAD FINDINGS Brain: No evidence of acute infarction, hemorrhage, hydrocephalus, extra-axial collection or mass lesion/mass effect. Advanced atrophy, especially in the medial temporal lobes, correlating with dementia history. Chronic small vessel ischemia Vascular: No hyperdense vessel or unexpected calcification. Skull: Negative for fracture Sinuses/Orbits: No visible injury CT CERVICAL SPINE FINDINGS Alignment: Slight degenerative anterolisthesis at C4-5 Skull base and vertebrae: No acute fracture. No primary bone lesion or focal pathologic process. Soft tissues and spinal canal: No prevertebral fluid or swelling. No visible canal hematoma. Disc levels: Ordinary degenerative changes.A right-sided laminectomy has been performed at C6-7. Upper chest: No acute finding.  Mild biapical scarring IMPRESSION: 1. No evidence of acute intracranial or cervical spine injury. 2. Atrophy in keeping with history of dementia. Electronically Signed   By: Monte Fantasia M.D.   On: 06/29/2019 10:44   CT Cervical Spine Wo Contrast  Result Date: 06/29/2019 CLINICAL DATA:  FALL.  DEMENTIA. EXAM: CT HEAD WITHOUT CONTRAST CT CERVICAL SPINE WITHOUT CONTRAST TECHNIQUE: Multidetector CT imaging of the head and cervical spine was performed following the standard protocol without intravenous contrast. Multiplanar CT image reconstructions of the cervical spine were also generated. COMPARISON:  09/14/2018 FINDINGS: CT HEAD FINDINGS Brain: No evidence of acute infarction, hemorrhage, hydrocephalus, extra-axial collection or mass lesion/mass effect. Advanced atrophy, especially in the medial temporal lobes, correlating with dementia history. Chronic small vessel ischemia Vascular: No hyperdense vessel or unexpected calcification. Skull: Negative for fracture Sinuses/Orbits: No visible injury CT CERVICAL SPINE FINDINGS Alignment: Slight degenerative anterolisthesis at C4-5 Skull base and vertebrae: No acute fracture. No primary bone lesion or focal pathologic process. Soft tissues and spinal canal: No prevertebral fluid or swelling. No visible canal hematoma. Disc levels: Ordinary degenerative changes.A right-sided laminectomy has been performed at C6-7. Upper chest: No acute finding.  Mild biapical scarring IMPRESSION: 1. No evidence of acute intracranial or cervical spine injury. 2. Atrophy in keeping with history of dementia. Electronically Signed   By: Monte Fantasia M.D.   On: 06/29/2019 10:44   DG FEMUR MIN 2 VIEWS LEFT  Result Date: 06/29/2019 CLINICAL DATA:  [place]. Left femur pain. EXAM: LEFT FEMUR 2 VIEWS COMPARISON:  None. FINDINGS: The left hip and left knee joints are maintained. No acute femur fracture. IMPRESSION: No acute bony findings. Electronically Signed   By: Marijo Sanes M.D.   On: 06/29/2019 10:27    Assessment & Plan:   Problem List Items Addressed This Visit      Unprioritized   Excessive body weight loss    Secondary to progressive dementia and  unsupervised meals.  She needs to be in an assisted living facility      Moderate dementia with behavioral disturbance ([place]) - Primary    She is now taking  Exelon .  She is in independent living .  She rarely leaves her apartment and is losing weight. She would benefit from a more supervised supportive  environment       Relevant Medications   mirtazapine (REMERON) 15 MG tablet   Other Relevant Orders   AMB Referral to [facility]   History of fall within past 90 days    Secondary to nocturnal wandering .  Resume mirtazapine          I provided  30 minutes of  face-to-face time during this encounter reviewing patient's current problems and past surgeries, labs and imaging studies, providing counseling on the above mentioned problems , and coordination  of care .  I have changed Arlana Pouch. [place]'s mirtazapine and acetaminophen. I am also having her maintain her ondansetron, metoprolol tartrate, pravastatin, escitalopram, rivastigmine, cholecalciferol, donepezil, furosemide, Xarelto, Cranberry, mometasone, levothyroxine, losartan, and esomeprazole. We will continue to administer cyanocobalamin.  Meds ordered this encounter  Medications  . mirtazapine (REMERON) 15 MG tablet    Sig: Take 0.5 tablets (7.5 mg total) by mouth at bedtime.    Dispense:  30 tablet    Refill:  5    PLEASE ADD TO April PILL PACK  . acetaminophen (TYLENOL) 500 MG tablet    Sig: Take 2 tablets (1,000 mg total) by mouth every 12 (twelve) hours.    Dispense:  120 tablet    Refill:  1    Medications Discontinued During This Encounter  Medication Reason  . mirtazapine (REMERON) 15 MG tablet Reorder  . acetaminophen (TYLENOL) 500 MG tablet     Follow-up: No follow-ups on file.   Crecencio Mc, MD

## 2019-07-17 NOTE — Patient Instructions (Addendum)
Alexandra Martinez does not have any fractures ,  She does not need an MRI  . You need to take your tylenol  You can take   1000 mg every 12 hours.  For your hip pain  I will  Restart the  mirtazipine at  7.5 mg daily

## 2019-07-18 ENCOUNTER — Other Ambulatory Visit: Payer: Self-pay

## 2019-07-18 NOTE — Assessment & Plan Note (Signed)
Secondary to progressive dementia and unsupervised meals.  She needs to be in an assisted living facility

## 2019-07-18 NOTE — Assessment & Plan Note (Signed)
She is now taking  Exelon .  She is in independent living .  She rarely leaves her apartment and is losing weight. She would benefit from a more supervised supportive  environment

## 2019-07-18 NOTE — Assessment & Plan Note (Signed)
Secondary to nocturnal wandering .  Resume mirtazapine

## 2019-07-18 NOTE — Patient Outreach (Signed)
Screening: New referral from MD office for social worker for placement  Placed call to [name] - niece of patient who is health care power of attorney.  Ms. Starleen Blue reports that patient is living at [facility] in [place]. States patient needs to be moved to assisted living. Patient is unable to manage her medication or bathing. Patient has [facility] caregiver services for medication reminders.  Ms. Starleen Blue states patient has had 2 recent falls after being started on Xanax.  Xanax has now been discontinued.   Niece is health care power of attorney and reports she needs assistance getting patient placed.  PLAN: will place referral to [facility] worker/ No nursing needs noted.   Tomasa Rand, RN, BSN, CEN [facility] [employer] [phone]

## 2019-07-19 ENCOUNTER — Telehealth: Payer: Self-pay

## 2019-07-19 NOTE — Telephone Encounter (Signed)
07/19/2019 Spoke with patient's niece [name] about [facility] and the [facility].  Emailed information with permission to [email or web address]  Ambrose Mantle [phone]

## 2019-07-20 DIAGNOSIS — R488 Other symbolic dysfunctions: Secondary | ICD-10-CM | POA: Diagnosis not present

## 2019-07-20 DIAGNOSIS — R41841 Cognitive communication deficit: Secondary | ICD-10-CM | POA: Diagnosis not present

## 2019-07-20 DIAGNOSIS — M6281 Muscle weakness (generalized): Secondary | ICD-10-CM | POA: Diagnosis not present

## 2019-07-20 DIAGNOSIS — R279 Unspecified lack of coordination: Secondary | ICD-10-CM | POA: Diagnosis not present

## 2019-07-21 DIAGNOSIS — R41841 Cognitive communication deficit: Secondary | ICD-10-CM | POA: Diagnosis not present

## 2019-07-21 DIAGNOSIS — M6281 Muscle weakness (generalized): Secondary | ICD-10-CM | POA: Diagnosis not present

## 2019-07-21 DIAGNOSIS — R488 Other symbolic dysfunctions: Secondary | ICD-10-CM | POA: Diagnosis not present

## 2019-07-21 DIAGNOSIS — R279 Unspecified lack of coordination: Secondary | ICD-10-CM | POA: Diagnosis not present

## 2019-07-24 DIAGNOSIS — R41841 Cognitive communication deficit: Secondary | ICD-10-CM | POA: Diagnosis not present

## 2019-07-24 DIAGNOSIS — R279 Unspecified lack of coordination: Secondary | ICD-10-CM | POA: Diagnosis not present

## 2019-07-24 DIAGNOSIS — R488 Other symbolic dysfunctions: Secondary | ICD-10-CM | POA: Diagnosis not present

## 2019-07-24 DIAGNOSIS — M6281 Muscle weakness (generalized): Secondary | ICD-10-CM | POA: Diagnosis not present

## 2019-07-25 DIAGNOSIS — R279 Unspecified lack of coordination: Secondary | ICD-10-CM | POA: Diagnosis not present

## 2019-07-25 DIAGNOSIS — M6281 Muscle weakness (generalized): Secondary | ICD-10-CM | POA: Diagnosis not present

## 2019-07-25 DIAGNOSIS — R41841 Cognitive communication deficit: Secondary | ICD-10-CM | POA: Diagnosis not present

## 2019-07-25 DIAGNOSIS — R488 Other symbolic dysfunctions: Secondary | ICD-10-CM | POA: Diagnosis not present

## 2019-07-26 DIAGNOSIS — R488 Other symbolic dysfunctions: Secondary | ICD-10-CM | POA: Diagnosis not present

## 2019-07-26 DIAGNOSIS — R41841 Cognitive communication deficit: Secondary | ICD-10-CM | POA: Diagnosis not present

## 2019-07-26 DIAGNOSIS — R279 Unspecified lack of coordination: Secondary | ICD-10-CM | POA: Diagnosis not present

## 2019-07-26 DIAGNOSIS — M6281 Muscle weakness (generalized): Secondary | ICD-10-CM | POA: Diagnosis not present

## 2019-07-27 DIAGNOSIS — R488 Other symbolic dysfunctions: Secondary | ICD-10-CM | POA: Diagnosis not present

## 2019-07-27 DIAGNOSIS — M6281 Muscle weakness (generalized): Secondary | ICD-10-CM | POA: Diagnosis not present

## 2019-07-27 DIAGNOSIS — R279 Unspecified lack of coordination: Secondary | ICD-10-CM | POA: Diagnosis not present

## 2019-07-27 DIAGNOSIS — R41841 Cognitive communication deficit: Secondary | ICD-10-CM | POA: Diagnosis not present

## 2019-07-30 ENCOUNTER — Telehealth: Payer: Self-pay

## 2019-07-30 DIAGNOSIS — R41841 Cognitive communication deficit: Secondary | ICD-10-CM | POA: Diagnosis not present

## 2019-07-30 DIAGNOSIS — R279 Unspecified lack of coordination: Secondary | ICD-10-CM | POA: Diagnosis not present

## 2019-07-30 DIAGNOSIS — R488 Other symbolic dysfunctions: Secondary | ICD-10-CM | POA: Diagnosis not present

## 2019-07-30 DIAGNOSIS — M6281 Muscle weakness (generalized): Secondary | ICD-10-CM | POA: Diagnosis not present

## 2019-07-30 NOTE — Telephone Encounter (Signed)
07/30/2019 Spoke with patient's niece [name]. Patient is being moved to [facility] assisted living.  [name] has the contact information for [employer] and [place] if needed.  Patient does not have any other needs.  Closing referral. Ambrose Mantle [phone]

## 2019-07-31 DIAGNOSIS — R488 Other symbolic dysfunctions: Secondary | ICD-10-CM | POA: Diagnosis not present

## 2019-07-31 DIAGNOSIS — M6281 Muscle weakness (generalized): Secondary | ICD-10-CM | POA: Diagnosis not present

## 2019-07-31 DIAGNOSIS — R41841 Cognitive communication deficit: Secondary | ICD-10-CM | POA: Diagnosis not present

## 2019-07-31 DIAGNOSIS — R279 Unspecified lack of coordination: Secondary | ICD-10-CM | POA: Diagnosis not present

## 2019-07-31 NOTE — Telephone Encounter (Signed)
Fantastic!  A process that works!!  Thank you for handling!

## 2019-08-01 ENCOUNTER — Other Ambulatory Visit: Payer: Self-pay | Admitting: Internal Medicine

## 2019-08-01 DIAGNOSIS — R41841 Cognitive communication deficit: Secondary | ICD-10-CM | POA: Diagnosis not present

## 2019-08-01 DIAGNOSIS — R279 Unspecified lack of coordination: Secondary | ICD-10-CM | POA: Diagnosis not present

## 2019-08-01 DIAGNOSIS — M6281 Muscle weakness (generalized): Secondary | ICD-10-CM | POA: Diagnosis not present

## 2019-08-01 DIAGNOSIS — R488 Other symbolic dysfunctions: Secondary | ICD-10-CM | POA: Diagnosis not present

## 2019-08-02 ENCOUNTER — Other Ambulatory Visit: Payer: Self-pay | Admitting: Internal Medicine

## 2019-08-03 DIAGNOSIS — R488 Other symbolic dysfunctions: Secondary | ICD-10-CM | POA: Diagnosis not present

## 2019-08-03 DIAGNOSIS — R41841 Cognitive communication deficit: Secondary | ICD-10-CM | POA: Diagnosis not present

## 2019-08-03 DIAGNOSIS — R279 Unspecified lack of coordination: Secondary | ICD-10-CM | POA: Diagnosis not present

## 2019-08-03 DIAGNOSIS — M6281 Muscle weakness (generalized): Secondary | ICD-10-CM | POA: Diagnosis not present

## 2019-08-06 DIAGNOSIS — R279 Unspecified lack of coordination: Secondary | ICD-10-CM | POA: Diagnosis not present

## 2019-08-06 DIAGNOSIS — R488 Other symbolic dysfunctions: Secondary | ICD-10-CM | POA: Diagnosis not present

## 2019-08-06 DIAGNOSIS — R41841 Cognitive communication deficit: Secondary | ICD-10-CM | POA: Diagnosis not present

## 2019-08-06 DIAGNOSIS — M6281 Muscle weakness (generalized): Secondary | ICD-10-CM | POA: Diagnosis not present

## 2019-08-07 ENCOUNTER — Ambulatory Visit (INDEPENDENT_AMBULATORY_CARE_PROVIDER_SITE_OTHER): Payer: Medicare Other

## 2019-08-07 ENCOUNTER — Other Ambulatory Visit: Payer: Self-pay

## 2019-08-07 DIAGNOSIS — Z0279 Encounter for issue of other medical certificate: Secondary | ICD-10-CM

## 2019-08-07 DIAGNOSIS — Z111 Encounter for screening for respiratory tuberculosis: Secondary | ICD-10-CM | POA: Diagnosis not present

## 2019-08-07 DIAGNOSIS — R279 Unspecified lack of coordination: Secondary | ICD-10-CM | POA: Diagnosis not present

## 2019-08-07 DIAGNOSIS — R41841 Cognitive communication deficit: Secondary | ICD-10-CM | POA: Diagnosis not present

## 2019-08-07 DIAGNOSIS — M6281 Muscle weakness (generalized): Secondary | ICD-10-CM | POA: Diagnosis not present

## 2019-08-07 DIAGNOSIS — R488 Other symbolic dysfunctions: Secondary | ICD-10-CM | POA: Diagnosis not present

## 2019-08-07 NOTE — Progress Notes (Signed)
Patient presented for PPD injection to left forearm, patient voiced no concerns nor showed any signs of distress during injection. Patient has a read appointment on Thursday.

## 2019-08-08 ENCOUNTER — Telehealth: Payer: Self-pay | Admitting: Internal Medicine

## 2019-08-08 DIAGNOSIS — R488 Other symbolic dysfunctions: Secondary | ICD-10-CM | POA: Diagnosis not present

## 2019-08-08 DIAGNOSIS — M6281 Muscle weakness (generalized): Secondary | ICD-10-CM | POA: Diagnosis not present

## 2019-08-08 DIAGNOSIS — R279 Unspecified lack of coordination: Secondary | ICD-10-CM | POA: Diagnosis not present

## 2019-08-08 DIAGNOSIS — R41841 Cognitive communication deficit: Secondary | ICD-10-CM | POA: Diagnosis not present

## 2019-08-08 NOTE — Telephone Encounter (Signed)
Alexandra Martinez from [facility] called and said she needs the [facility] and addendum that was faxed to [place] on 07/30/19. I let her know we would give her call back and to refax it to [place] just in case we don't have it.

## 2019-08-08 NOTE — Telephone Encounter (Signed)
This is in red folder.

## 2019-08-09 ENCOUNTER — Other Ambulatory Visit: Payer: Self-pay

## 2019-08-09 ENCOUNTER — Telehealth: Payer: Self-pay | Admitting: Internal Medicine

## 2019-08-09 ENCOUNTER — Ambulatory Visit: Payer: Medicare Other

## 2019-08-09 DIAGNOSIS — Z23 Encounter for immunization: Secondary | ICD-10-CM

## 2019-08-09 LAB — TB SKIN TEST
Induration: 0 mm
TB Skin Test: NEGATIVE

## 2019-08-09 NOTE — Telephone Encounter (Signed)
[name] called she is wanting to get a handicap Parking placard form filled out for pt  Please call [name] when completed [phone]

## 2019-08-09 NOTE — Telephone Encounter (Signed)
Thr form is more than an FL2 and requries a TB skin test, the last 2 OV notes printed  Please call patient's niece arrange an RN visit for the PPD ASAP

## 2019-08-09 NOTE — Telephone Encounter (Signed)
Patient comes in this afternoon for the PPD read she has had the injection placed on Tuesday.  Patient niece said is also a handicap placard form to be signed.

## 2019-08-10 DIAGNOSIS — M6281 Muscle weakness (generalized): Secondary | ICD-10-CM | POA: Diagnosis not present

## 2019-08-10 DIAGNOSIS — R279 Unspecified lack of coordination: Secondary | ICD-10-CM | POA: Diagnosis not present

## 2019-08-10 DIAGNOSIS — R488 Other symbolic dysfunctions: Secondary | ICD-10-CM | POA: Diagnosis not present

## 2019-08-10 DIAGNOSIS — R41841 Cognitive communication deficit: Secondary | ICD-10-CM | POA: Diagnosis not present

## 2019-08-10 NOTE — Telephone Encounter (Signed)
Paperwork has been completed and faxed back to [facility].

## 2019-08-10 NOTE — Telephone Encounter (Signed)
Handicap form placed in quick sign folder.

## 2019-08-13 DIAGNOSIS — R488 Other symbolic dysfunctions: Secondary | ICD-10-CM | POA: Diagnosis not present

## 2019-08-13 DIAGNOSIS — R279 Unspecified lack of coordination: Secondary | ICD-10-CM | POA: Diagnosis not present

## 2019-08-13 DIAGNOSIS — R41841 Cognitive communication deficit: Secondary | ICD-10-CM | POA: Diagnosis not present

## 2019-08-13 DIAGNOSIS — M6281 Muscle weakness (generalized): Secondary | ICD-10-CM | POA: Diagnosis not present

## 2019-08-14 DIAGNOSIS — Z961 Presence of intraocular lens: Secondary | ICD-10-CM | POA: Diagnosis not present

## 2019-08-15 DIAGNOSIS — R488 Other symbolic dysfunctions: Secondary | ICD-10-CM | POA: Diagnosis not present

## 2019-08-15 DIAGNOSIS — R41841 Cognitive communication deficit: Secondary | ICD-10-CM | POA: Diagnosis not present

## 2019-08-15 DIAGNOSIS — M6281 Muscle weakness (generalized): Secondary | ICD-10-CM | POA: Diagnosis not present

## 2019-08-15 DIAGNOSIS — R279 Unspecified lack of coordination: Secondary | ICD-10-CM | POA: Diagnosis not present

## 2019-08-16 NOTE — Telephone Encounter (Signed)
LMTCB

## 2019-08-16 NOTE — Telephone Encounter (Signed)
Spoke with [name] to let her know that the form was placed in the mail to her yesterday. [name] gave a verbal understanding.

## 2019-08-16 NOTE — Telephone Encounter (Signed)
Patient called to see if handicapp parking form is ready for pick up.

## 2019-08-17 DIAGNOSIS — F0391 Unspecified dementia with behavioral disturbance: Secondary | ICD-10-CM | POA: Diagnosis not present

## 2019-08-20 ENCOUNTER — Telehealth: Payer: Self-pay

## 2019-08-20 NOTE — Telephone Encounter (Signed)
Agree to verbal orders. Authorized

## 2019-08-20 NOTE — Telephone Encounter (Signed)
Verbal orders given for PT 1x week for one week and 2x a week for 6 weeks.

## 2019-08-23 ENCOUNTER — Telehealth: Payer: Self-pay | Admitting: Internal Medicine

## 2019-08-23 NOTE — Telephone Encounter (Signed)
[facility] called need a new order verbal so OT can go out call back number is [phone] contact person Karna Dupes

## 2019-08-23 NOTE — Telephone Encounter (Signed)
Verbal orders given to [facility].

## 2019-08-24 DIAGNOSIS — Z7901 Long term (current) use of anticoagulants: Secondary | ICD-10-CM | POA: Diagnosis not present

## 2019-08-24 DIAGNOSIS — E039 Hypothyroidism, unspecified: Secondary | ICD-10-CM

## 2019-08-24 DIAGNOSIS — F0391 Unspecified dementia with behavioral disturbance: Secondary | ICD-10-CM | POA: Diagnosis not present

## 2019-08-24 DIAGNOSIS — I4891 Unspecified atrial fibrillation: Secondary | ICD-10-CM | POA: Diagnosis not present

## 2019-08-24 DIAGNOSIS — F419 Anxiety disorder, unspecified: Secondary | ICD-10-CM | POA: Diagnosis not present

## 2019-08-24 DIAGNOSIS — Z9181 History of falling: Secondary | ICD-10-CM | POA: Diagnosis not present

## 2019-08-24 DIAGNOSIS — I129 Hypertensive chronic kidney disease with stage 1 through stage 4 chronic kidney disease, or unspecified chronic kidney disease: Secondary | ICD-10-CM | POA: Diagnosis not present

## 2019-08-24 DIAGNOSIS — M545 Low back pain: Secondary | ICD-10-CM

## 2019-08-24 DIAGNOSIS — R443 Hallucinations, unspecified: Secondary | ICD-10-CM | POA: Diagnosis not present

## 2019-08-24 DIAGNOSIS — G8929 Other chronic pain: Secondary | ICD-10-CM | POA: Diagnosis not present

## 2019-08-24 DIAGNOSIS — N183 Chronic kidney disease, stage 3 unspecified: Secondary | ICD-10-CM | POA: Diagnosis not present

## 2019-08-24 NOTE — Telephone Encounter (Signed)
Meredith with [name] called about OT orders one week one-two week two-one week one. Please call back @ [phone]

## 2019-08-24 NOTE — Telephone Encounter (Signed)
Verbal orders given  

## 2019-08-29 ENCOUNTER — Telehealth (INDEPENDENT_AMBULATORY_CARE_PROVIDER_SITE_OTHER): Payer: Medicare Other | Admitting: Internal Medicine

## 2019-08-29 ENCOUNTER — Encounter: Payer: Self-pay | Admitting: Internal Medicine

## 2019-08-29 DIAGNOSIS — R2681 Unsteadiness on feet: Secondary | ICD-10-CM

## 2019-08-29 DIAGNOSIS — F5105 Insomnia due to other mental disorder: Secondary | ICD-10-CM

## 2019-08-29 DIAGNOSIS — F411 Generalized anxiety disorder: Secondary | ICD-10-CM

## 2019-08-29 DIAGNOSIS — F99 Mental disorder, not otherwise specified: Secondary | ICD-10-CM | POA: Insufficient documentation

## 2019-08-29 DIAGNOSIS — F03B18 Unspecified dementia, moderate, with other behavioral disturbance: Secondary | ICD-10-CM

## 2019-08-29 DIAGNOSIS — F0391 Unspecified dementia with behavioral disturbance: Secondary | ICD-10-CM

## 2019-08-29 MED ORDER — PAROXETINE HCL 10 MG PO TABS: 10.0000 mg | ORAL_TABLET | Freq: Every day | ORAL | 0 refills | Status: DC

## 2019-08-29 MED ORDER — MIRTAZAPINE 15 MG PO TABS: 15.0000 mg | ORAL_TABLET | Freq: Every day | ORAL | 0 refills | Status: DC

## 2019-08-29 NOTE — Assessment & Plan Note (Signed)
She has developed neglect of personal hygiene and intermittent confusion regarding use of table ware  (forks ,spoon etc).  OT needed

## 2019-08-29 NOTE — Assessment & Plan Note (Addendum)
PT needed to address unsteady gait, balance issues  Leg weakness. Encourage use of walker

## 2019-08-29 NOTE — Assessment & Plan Note (Signed)
Changing lexapro to paroxetine for calming effect.  Continue alprazolam 0.25 mg qhs

## 2019-08-29 NOTE — Progress Notes (Signed)
Virtual Visit via [place] Note  This visit type was conducted due to national recommendations for restrictions regarding the COVID-19 pandemic (e.g. social distancing).  This format is felt to be most appropriate for this patient at this time.  All issues noted in this document were discussed and addressed.  No physical exam was performed (except for noted visual exam findings with Video Visits).   I connected with@ on 08/29/19 at  4:30 PM EDT by a video enabled telemedicine application and verified that I am speaking with the correct person using two identifiers. Location patient: home Location provider: work or home office Persons participating in the virtual visit: patient, provider and aide  I discussed the limitations, risks, security and privacy concerns of performing an evaluation and management service by telephone and the availability of in person appointments. I also discussed with the patient that there may be a patient responsible charge related to this service. The patient expressed understanding and agreed to proceed.  Reason for visit:  Patient's need for PT /OT  HPI:  86 yr old female with progressive dementia,  Recently admitted to long term care facility  Presents for follow up .  Accompanied by Seth Bake,  From facility.  Patient is well groomed, calm and alert.  She is still  adjusting to new her residence at [facility] on the [address], which is locked .  She has been living there for 2-3 weeks , and per Seth Bake spends most of the day trying to find a way off the floor and looking for her niece [name]. She has not experienced any  No hallucinations or combative behavior but has exhibited some belligerence toward her roommate on occasions about every other day.  Medications reviewed:  lexapro 10 mg , mirtazapine 7.5 mg  And alprazolam 0.25 mg (last 2 at 8 pm ) they do not seem to be helping with the nocturnal wandering.    Staff has noticed tripping occurring  and unsteadiness on feet,  But no falls since she began using a walker.  She tends to walk off without the walker and uses the wall for support when she walks   She has been neglecting her personal hygiene and occasionally forgetting how to use utensils.  .     ROS: See pertinent positives and negatives per HPI.  Past Medical History:  Diagnosis Date  . Anemia    r/t uterine fibroids  . Atrial fib/flutter, transient    [facility] admission for RVR 03/2009  . DDD (degenerative disc disease)   . Dysrhythmia    A-FIB  . GERD (gastroesophageal reflux disease)   . H/O bladder infections   . H/O hiatal hernia   . H/O thyroid nodule    surgically removed  . Heart murmur    moderate MR/TR 08/2013 echo [name])  . Hemorrhoids   . Hiatal hernia with gastroesophageal reflux 2008   by EGD,  [name]  . High cholesterol   . History of uterine fibroid   . Hydronephrosis    uretero- with congenital UPJ obstruction- Dr. Edrick Oh  . Hydronephrosis with ureteropelvic junction obstruction   . Hypertension    [facility]  . Hypothyroidism   . Nocturia   . Osteoporosis   . Polyuria   . Recurrent UTI     Past Surgical History:  Procedure Laterality Date  . ADENOIDECTOMY N/A 01/05/2016   Procedure: ADENOIDECTOMY;  Surgeon: Margaretha Sheffield, MD;  Location: [facility] ORS;  Service: ENT;  Laterality: N/A;  .  BACK SURGERY    . CATARACT EXTRACTION     left  . CERVICAL DISC SURGERY  1980's  . CHOLECYSTECTOMY    . NASOPHARYNGOSCOPY  01/05/2016   Procedure: NASOPHARYNGOSCOPY;  Surgeon: Margaretha Sheffield, MD;  Location: [facility] ORS;  Service: ENT;;  . THYROID LOBECTOMY     partial, right lobe  . TOTAL ABDOMINAL HYSTERECTOMY W/ BILATERAL SALPINGOOPHORECTOMY      Family History  Problem Relation Age of Onset  . Heart disease Mother   . Coronary artery disease Father        MI's    SOCIAL HX: resident of [employer] [facility] living  One remaining relative,  Niece Stedman.   reports that she has never smoked.  She has never used smokeless tobacco. She reports that she does not drink alcohol or use drugs.   Current Outpatient Medications:  .  ALPRAZolam (XANAX) 0.25 MG tablet, Take 0.25 mg by mouth at bedtime., Disp: , Rfl:  .  cholecalciferol (VITAMIN D3) 25 MCG (1000 UT) tablet, Take 1,000 Units by mouth daily. , Disp: , Rfl:  .  esomeprazole (NEXIUM) 40 MG capsule, TAKE 1 CAPSULE TWICE DAILY BEFORE MEALS, Disp: 180 capsule, Rfl: 0 .  levothyroxine (SYNTHROID) 50 MCG tablet, TAKE ONE TABLET EVERY DAY, Disp: 90 tablet, Rfl: 1 .  losartan (COZAAR) 100 MG tablet, TAKE 1 TABLET BY MOUTH DAILY, Disp: 90 tablet, Rfl: 1 .  metoprolol tartrate (LOPRESSOR) 100 MG tablet, TAKE ONE TABLET BY MOUTH TWICE DAILY (Patient taking differently: Take 50 mg by mouth 2 (two) times daily. ), Disp: 180 tablet, Rfl: 1 .  mirtazapine (REMERON) 15 MG tablet, Take 1 tablet (15 mg total) by mouth at bedtime., Disp: 90 tablet, Rfl: 0 .  rivastigmine (EXELON) 3 MG capsule, Take 1 capsule (3 mg total) by mouth 2 (two) times daily., Disp: 180 capsule, Rfl: 3 .  XARELTO 15 MG TABS tablet, Take 15 mg by mouth daily., Disp: , Rfl:  .  acetaminophen (TYLENOL) 500 MG tablet, Take 2 tablets (1,000 mg total) by mouth every 12 (twelve) hours. (Patient not taking: Reported on 08/29/2019), Disp: 120 tablet, Rfl: 1 .  Cranberry 1000 MG CAPS, Take by mouth., Disp: , Rfl:  .  donepezil (ARICEPT) 10 MG tablet, Take 10 mg by mouth at bedtime. , Disp: , Rfl:  .  furosemide (LASIX) 20 MG tablet, Take by mouth., Disp: , Rfl:  .  mometasone (NASONEX) 50 MCG/ACT nasal spray, Place 2 sprays into the nose daily., Disp: , Rfl:  .  ondansetron (ZOFRAN) 4 MG tablet, TAKE 1 TABLET BY MOUTH EVERY 8 HOURS AS NEEDED FOR NAUSEA OR VOMITING (Patient not taking: Reported on 08/29/2019), Disp: 30 tablet, Rfl: 0 .  PARoxetine (PAXIL) 10 MG tablet, Take 1 tablet (10 mg total) by mouth daily., Disp: 90 tablet, Rfl: 0 .  pravastatin (PRAVACHOL) 20 MG tablet, Take 20 mg  by mouth daily., Disp: , Rfl:   Current Facility-Administered Medications:  .  cyanocobalamin ((VITAMIN B-12)) injection 1,000 mcg, 1,000 mcg, Intramuscular, Once, Derrel Nip, Aris Everts, MD  EXAM:  VITALS per patient if applicable:  GENERAL: alert, oriented, appears well and in no acute distress  HEENT: atraumatic, conjunttiva clear, no obvious abnormalities on inspection of external nose and ears  NECK: normal movements of the head and neck  LUNGS: on inspection no signs of respiratory distress, breathing rate appears normal, no obvious gross SOB, gasping or wheezing  CV: no obvious cyanosis  MS: moves all visible extremities without noticeable abnormality  PSYCH/NEURO: pleasant and cooperative, no obvious depression or anxiety, speech and thought processing grossly intact  ASSESSMENT AND PLAN:  Discussed the following assessment and plan:  GAD (generalized anxiety disorder)  Insomnia due to other mental disorder  Moderate dementia with behavioral disturbance (HCC)  Unsteady gait when walking  GAD (generalized anxiety disorder) Changing lexapro to paroxetine for calming effect.  Continue alprazolam 0.25 mg qhs   Insomnia due to other mental disorder Increase mirtazapine to 15 mg qhs and continue alprazolam   Moderate dementia with behavioral disturbance ([place]) She has developed neglect of personal hygiene and intermittent confusion regarding use of table ware  (forks ,spoon etc).  OT needed   Unsteady gait when walking PT needed to address unsteady gait, balance issues  Leg weakness. Encourage use of walker     I discussed the assessment and treatment plan with the patient. The patient was provided an opportunity to ask questions and all were answered. The patient agreed with the plan and demonstrated an understanding of the instructions.   The patient was advised to call back or seek an in-person evaluation if the symptoms worsen or if the condition fails to improve as  anticipated.   I provided  30 minutes of non-face-to-face time during this encounter reviewing patient's current problems and past surgeries, labs and imaging studies, providing counseling on the above mentioned problems , and coordination  of care . Crecencio Mc, MD

## 2019-08-29 NOTE — Assessment & Plan Note (Signed)
Increase mirtazapine to 15 mg qhs and continue alprazolam

## 2019-08-31 ENCOUNTER — Other Ambulatory Visit: Payer: Self-pay | Admitting: Internal Medicine

## 2019-08-31 ENCOUNTER — Telehealth: Payer: Self-pay | Admitting: Internal Medicine

## 2019-08-31 MED ORDER — ALPRAZOLAM 0.25 MG PO TABS: 0.2500 mg | ORAL_TABLET | Freq: Every day | ORAL | 5 refills | Status: DC

## 2019-08-31 NOTE — Telephone Encounter (Signed)
[facility]andra [facility] called saying they need a hard script sent to [facility] [phone] For ALPRAZolam [name]) 0.25 MG tablet

## 2019-08-31 NOTE — Telephone Encounter (Signed)
Hard script has been faxed to fax number provided below.

## 2019-08-31 NOTE — Telephone Encounter (Signed)
[facility]  Is that the correct pharmacy?  She is now in a facility

## 2019-08-31 NOTE — Telephone Encounter (Signed)
According to [facility]'s first message they are needing a hard script faxed over to [facility].

## 2019-08-31 NOTE — Telephone Encounter (Signed)
Hard copy printed and signed please fax to [facility]

## 2019-08-31 NOTE — Telephone Encounter (Signed)
Refill request for xanax, last seen 08-29-19, last filled 08-14-19.  Please advise.

## 2019-08-31 NOTE — Addendum Note (Signed)
Addended by: Crecencio Mc on: 08/31/2019 05:33 PM   Modules accepted: Orders

## 2019-09-06 ENCOUNTER — Telehealth: Payer: Self-pay

## 2019-09-06 NOTE — Telephone Encounter (Signed)
Alexandra Martinez from [facility] unit called stating that the pt has been complaining of painful urination and burning. They asked if they could get an order for a UA. I have typed up the orders per Dr. Lupita Dawn verbal okay.

## 2019-09-06 NOTE — Telephone Encounter (Signed)
[facility] ORDER FOR UA AND CULTURE AUTHORIZED

## 2019-09-06 NOTE — Telephone Encounter (Signed)
Faxed to [facility]

## 2019-09-11 ENCOUNTER — Encounter: Payer: Self-pay | Admitting: Internal Medicine

## 2019-09-11 DIAGNOSIS — R300 Dysuria: Secondary | ICD-10-CM | POA: Diagnosis not present

## 2019-09-12 ENCOUNTER — Other Ambulatory Visit: Payer: Self-pay

## 2019-09-12 NOTE — Telephone Encounter (Signed)
[facility] faxed a request for a refill on Alprazolam. They need a hard copy faxed to them so they can fax it to their pharmacy. Fax # [phone].  Refilled: 08/31/2019 Last OV: 08/29/2019 Next OV: 10/17/2019

## 2019-09-13 ENCOUNTER — Other Ambulatory Visit: Payer: Self-pay | Admitting: Internal Medicine

## 2019-09-13 MED ORDER — ALPRAZOLAM 0.25 MG PO TABS: 0.2500 mg | ORAL_TABLET | Freq: Every day | ORAL | 5 refills | Status: DC

## 2019-09-13 NOTE — Telephone Encounter (Signed)
Printed rx  

## 2019-09-14 NOTE — Telephone Encounter (Signed)
Rx has been faxed.

## 2019-09-16 DIAGNOSIS — F0391 Unspecified dementia with behavioral disturbance: Secondary | ICD-10-CM | POA: Diagnosis not present

## 2019-09-18 ENCOUNTER — Telehealth: Payer: Self-pay | Admitting: Internal Medicine

## 2019-09-18 NOTE — Telephone Encounter (Signed)
BP is normal .  No changes to meds

## 2019-09-18 NOTE — Telephone Encounter (Signed)
[facility] called to give a BP reading 104/50 Asymptomatic

## 2019-09-19 ENCOUNTER — Other Ambulatory Visit: Payer: Self-pay | Admitting: Internal Medicine

## 2019-09-19 MED ORDER — CIPROFLOXACIN HCL 250 MG PO TABS: 250.0000 mg | ORAL_TABLET | Freq: Two times a day (BID) | ORAL | 0 refills | Status: DC

## 2019-09-19 MED ORDER — ALIGN PO CAPS: 1.0000 | ORAL_CAPSULE | Freq: Every day | ORAL | 0 refills | Status: DC

## 2019-09-19 NOTE — Telephone Encounter (Signed)
Spoke with Alexandra Martinez at [facility] to let her know that the bp reading is normal and no medication changes are needed.

## 2019-09-19 NOTE — Progress Notes (Signed)
Patient has a klebsiella UTI sensitive to cipro.  [facility].  Daily use of a probiotic advised for 3 weeks.

## 2019-09-24 ENCOUNTER — Telehealth: Payer: Self-pay | Admitting: Internal Medicine

## 2019-09-24 NOTE — Telephone Encounter (Signed)
FYI

## 2019-09-24 NOTE — Telephone Encounter (Signed)
[name] called to report a missed PT appt on 09/21/19

## 2019-10-03 DIAGNOSIS — N2581 Secondary hyperparathyroidism of renal origin: Secondary | ICD-10-CM | POA: Diagnosis not present

## 2019-10-03 DIAGNOSIS — I10 Essential (primary) hypertension: Secondary | ICD-10-CM | POA: Diagnosis not present

## 2019-10-03 DIAGNOSIS — R809 Proteinuria, unspecified: Secondary | ICD-10-CM | POA: Diagnosis not present

## 2019-10-03 DIAGNOSIS — N1832 Chronic kidney disease, stage 3b: Secondary | ICD-10-CM | POA: Diagnosis not present

## 2019-10-03 DIAGNOSIS — R600 Localized edema: Secondary | ICD-10-CM | POA: Diagnosis not present

## 2019-10-03 DIAGNOSIS — D631 Anemia in chronic kidney disease: Secondary | ICD-10-CM | POA: Diagnosis not present

## 2019-10-09 ENCOUNTER — Telehealth: Payer: Self-pay | Admitting: Internal Medicine

## 2019-10-09 NOTE — Telephone Encounter (Signed)
Order has been faxed to [facility].

## 2019-10-09 NOTE — Telephone Encounter (Signed)
Placed in quick sign folder.  

## 2019-10-09 NOTE — Telephone Encounter (Signed)
Alexandra Martinez called and states that [facility] will not give pt 500mg  Tylenol twice a day without PCP giving orders. Please advise

## 2019-10-09 NOTE — Telephone Encounter (Signed)
Letter on printer

## 2019-10-09 NOTE — Telephone Encounter (Signed)
[name] called back to give phone number for pt [facility] [phone]

## 2019-10-10 NOTE — Telephone Encounter (Signed)
Re-faxed.

## 2019-10-10 NOTE — Telephone Encounter (Signed)
[facility] unit called back and said they never received fax. Please refax to [phone].

## 2019-10-17 ENCOUNTER — Ambulatory Visit (INDEPENDENT_AMBULATORY_CARE_PROVIDER_SITE_OTHER): Payer: Medicare Other | Admitting: Internal Medicine

## 2019-10-17 ENCOUNTER — Encounter: Payer: Self-pay | Admitting: Internal Medicine

## 2019-10-17 ENCOUNTER — Other Ambulatory Visit: Payer: Self-pay

## 2019-10-17 DIAGNOSIS — F03B18 Unspecified dementia, moderate, with other behavioral disturbance: Secondary | ICD-10-CM

## 2019-10-17 DIAGNOSIS — I872 Venous insufficiency (chronic) (peripheral): Secondary | ICD-10-CM

## 2019-10-17 DIAGNOSIS — D6869 Other thrombophilia: Secondary | ICD-10-CM | POA: Diagnosis not present

## 2019-10-17 DIAGNOSIS — F99 Mental disorder, not otherwise specified: Secondary | ICD-10-CM

## 2019-10-17 DIAGNOSIS — Z7901 Long term (current) use of anticoagulants: Secondary | ICD-10-CM | POA: Diagnosis not present

## 2019-10-17 DIAGNOSIS — F0391 Unspecified dementia with behavioral disturbance: Secondary | ICD-10-CM

## 2019-10-17 DIAGNOSIS — F5105 Insomnia due to other mental disorder: Secondary | ICD-10-CM

## 2019-10-17 DIAGNOSIS — F411 Generalized anxiety disorder: Secondary | ICD-10-CM

## 2019-10-17 NOTE — Assessment & Plan Note (Signed)
Recommend use of compression knee highs.  DME order written and order for facility to don stockings QAM and remove QPM

## 2019-10-17 NOTE — Patient Instructions (Signed)
The Paxil is helping her mood and has stopped her weight loss  She needs a new roommate!  She needs to wear compression knee highs daily to manage the swelling in her legs   [facility] does a great job of fitting patients with these garments    I have made a referral to our pharmacist,  Catie Darnelle Maffucci to help manage your medications and find alternatives that are less expensive

## 2019-10-17 NOTE — Assessment & Plan Note (Signed)
takign xarelto for embolic stroke risk mitigation

## 2019-10-17 NOTE — Progress Notes (Signed)
Subjective:  [name], female    DOB: 09-30-1929  Age: 86 y.o. MRN: [id number]  CC: The primary encounter diagnosis was Venous insufficiency of both lower extremities. Diagnoses of Long term current use of anticoagulant therapy, Moderate dementia with behavioral disturbance ([place]), Acquired thrombophilia ([place]), GAD (generalized anxiety disorder), and Insomnia due to other mental disorder were also pertinent to this visit.  HPI Alexandra Martinez presents for follow up on GAD, dementia.    This visit occurred during the SARS-CoV-2 public health emergency.  Safety protocols were in place, including screening questions prior to the visit, additional usage of staff PPE, and extensive cleaning of exam room while observing appropriate contact time as indicated for disinfecting solutions.   Seen one month ago, and lexapro changed to paxil for calming effect  Niece feels this has helped .  Eating better.   Has gained 3 lbs since last visit. However she states that she is not  sleeping well bc roommate is talkative all night long.  Acquired thrombophila.  Takes Xarelto for stroke risk reduction due to atrial fib.  No falls  No bleeding issues.  Cost issues with Xarelto   Discussed CCM consult .    Outpatient Medications Prior to Visit  Medication Sig Dispense Refill  . acetaminophen (TYLENOL) 500 MG tablet Take 2 tablets (1,000 mg total) by mouth every 12 (twelve) hours. 120 tablet 1  . ALPRAZolam (XANAX) 0.25 MG tablet Take 1 tablet (0.25 mg total) by mouth at bedtime. 30 tablet 5  . bifidobacterium infantis (ALIGN) capsule Take 1 capsule by mouth daily. 21 capsule 0  . cholecalciferol (VITAMIN D3) 25 MCG (1000 UT) tablet Take 1,000 Units by mouth daily.     Marland Kitchen esomeprazole (NEXIUM) 40 MG capsule TAKE 1 CAPSULE TWICE DAILY BEFORE MEALS 180 capsule 0  . levothyroxine (SYNTHROID) 50 MCG tablet TAKE ONE TABLET EVERY DAY 90 tablet 1  . losartan (COZAAR) 100 MG tablet TAKE 1 TABLET BY MOUTH  DAILY 90 tablet 1  . metoprolol tartrate (LOPRESSOR) 100 MG tablet TAKE ONE TABLET BY MOUTH TWICE DAILY (Patient taking differently: Take 50 mg by mouth 2 (two) times daily. ) 180 tablet 1  . mirtazapine (REMERON) 15 MG tablet Take 1 tablet (15 mg total) by mouth at bedtime. 90 tablet 0  . PARoxetine (PAXIL) 10 MG tablet Take 1 tablet (10 mg total) by mouth daily. 90 tablet 0  . rivastigmine (EXELON) 3 MG capsule Take 1 capsule (3 mg total) by mouth 2 (two) times daily. 180 capsule 3  . XARELTO 15 MG TABS tablet Take 15 mg by mouth daily.    . ciprofloxacin (CIPRO) 250 MG tablet Take 1 tablet (250 mg total) by mouth 2 (two) times daily. (Patient not taking: Reported on 10/17/2019) 6 tablet 0  . Cranberry 1000 MG CAPS Take by mouth. (Patient not taking: Reported on 10/17/2019)    . donepezil (ARICEPT) 10 MG tablet Take 10 mg by mouth at bedtime.  (Patient not taking: Reported on 10/17/2019)    . furosemide (LASIX) 20 MG tablet Take by mouth. (Patient not taking: Reported on 10/17/2019)    . mometasone (NASONEX) 50 MCG/ACT nasal spray Place 2 sprays into the nose daily. (Patient not taking: Reported on 10/17/2019)    . ondansetron (ZOFRAN) 4 MG tablet TAKE 1 TABLET BY MOUTH EVERY 8 HOURS AS NEEDED FOR NAUSEA OR VOMITING (Patient not taking: Reported on 08/29/2019) 30 tablet 0  . pravastatin (PRAVACHOL) 20 MG tablet Take  20 mg by mouth daily. (Patient not taking: Reported on 10/17/2019)     Facility-Administered Medications Prior to Visit  Medication Dose Route Frequency Provider Last Rate Last Admin  . cyanocobalamin ((VITAMIN B-12)) injection 1,000 mcg  1,000 mcg Intramuscular Once Crecencio Mc, MD        Review of Systems;  Patient denies headache, fevers, malaise, unintentional weight loss, skin rash, eye pain, sinus congestion and sinus pain, sore throat, dysphagia,  hemoptysis , cough, dyspnea, wheezing, chest pain, palpitations, orthopnea, edema, abdominal pain, nausea, melena, diarrhea,  constipation, flank pain, dysuria, hematuria, urinary  Frequency, nocturia, numbness, tingling, seizures,  Focal weakness, Loss of consciousness,  Tremor, insomnia, depression, anxiety, and suicidal ideation.      Objective:  There were no vitals taken for this visit.  BP Readings from Last 3 Encounters:  08/29/19 138/76  07/17/19 118/68  06/29/19 (!) 148/86    Wt Readings from Last 3 Encounters:  08/29/19 130 lb 9.6 oz (59.2 kg)  07/17/19 127 lb 12.8 oz (58 kg)  06/29/19 128 lb 15.5 oz (58.5 kg)    General appearance: alert, cooperative and appears stated age Ears: normal TM's and external ear canals both ears Throat: lips, mucosa, and tongue normal; teeth and gums normal Neck: no adenopathy, no carotid bruit, supple, symmetrical, trachea midline and thyroid not enlarged, symmetric, no tenderness/mass/nodules Back: symmetric, no curvature. ROM normal. No CVA tenderness. Lungs: clear to auscultation bilaterally Heart: regular rate and rhythm, S1, S2 normal, no murmur, click, rub or gallop Abdomen: soft, non-tender; bowel sounds normal; no masses,  no organomegaly Pulses: 2+ and symmetric Skin: Skin color, texture, turgor normal. No rashes or lesions Lymph nodes: Cervical, supraclavicular, and axillary nodes normal.  No results found for: HGBA1C  Lab Results  Component Value Date   CREATININE 1.00 06/29/2019   CREATININE 1.29 (H) 03/27/2019   CREATININE 1.27 (H) 09/19/2018    Lab Results  Component Value Date   WBC 9.4 06/29/2019   HGB 10.6 (L) 06/29/2019   HCT 33.7 (L) 06/29/2019   PLT 114 (L) 06/29/2019   GLUCOSE 97 06/29/2019   CHOL 217 (H) 07/16/2013   TRIG 88.0 07/16/2013   HDL 41.70 07/16/2013   LDLCALC 158 (H) 07/16/2013   ALT 13 06/29/2019   AST 22 06/29/2019   NA 138 06/29/2019   K 4.2 06/29/2019   CL 106 06/29/2019   CREATININE 1.00 06/29/2019   BUN 20 06/29/2019   CO2 22 06/29/2019   TSH 1.69 03/27/2019   INR 1.6 (H) 08/04/2015    DG Pelvis 1-2  Views  Result Date: 06/29/2019 CLINICAL DATA:  [place]. Left hip and pelvic pain. EXAM: PELVIS - 1-2 VIEW COMPARISON:  None. FINDINGS: Both hips are normally located. No acute hip fracture. The pubic symphysis and SI joints are intact. No definite pelvic fractures. IMPRESSION: No acute bony findings. Electronically Signed   By: Marijo Sanes M.D.   On: 06/29/2019 10:26   CT Head Wo Contrast  Result Date: 06/29/2019 CLINICAL DATA:  FALL.  DEMENTIA. EXAM: CT HEAD WITHOUT CONTRAST CT CERVICAL SPINE WITHOUT CONTRAST TECHNIQUE: Multidetector CT imaging of the head and cervical spine was performed following the standard protocol without intravenous contrast. Multiplanar CT image reconstructions of the cervical spine were also generated. COMPARISON:  09/14/2018 FINDINGS: CT HEAD FINDINGS Brain: No evidence of acute infarction, hemorrhage, hydrocephalus, extra-axial collection or mass lesion/mass effect. Advanced atrophy, especially in the medial temporal lobes, correlating with dementia history. Chronic small vessel ischemia Vascular: No hyperdense  vessel or unexpected calcification. Skull: Negative for fracture Sinuses/Orbits: No visible injury CT CERVICAL SPINE FINDINGS Alignment: Slight degenerative anterolisthesis at C4-5 Skull base and vertebrae: No acute fracture. No primary bone lesion or focal pathologic process. Soft tissues and spinal canal: No prevertebral fluid or swelling. No visible canal hematoma. Disc levels: Ordinary degenerative changes.A right-sided laminectomy has been performed at C6-7. Upper chest: No acute finding.  Mild biapical scarring IMPRESSION: 1. No evidence of acute intracranial or cervical spine injury. 2. Atrophy in keeping with history of dementia. Electronically Signed   By: Monte Fantasia M.D.   On: 06/29/2019 10:44   CT Cervical Spine Wo Contrast  Result Date: 06/29/2019 CLINICAL DATA:  FALL.  DEMENTIA. EXAM: CT HEAD WITHOUT CONTRAST CT CERVICAL SPINE WITHOUT CONTRAST TECHNIQUE:  Multidetector CT imaging of the head and cervical spine was performed following the standard protocol without intravenous contrast. Multiplanar CT image reconstructions of the cervical spine were also generated. COMPARISON:  09/14/2018 FINDINGS: CT HEAD FINDINGS Brain: No evidence of acute infarction, hemorrhage, hydrocephalus, extra-axial collection or mass lesion/mass effect. Advanced atrophy, especially in the medial temporal lobes, correlating with dementia history. Chronic small vessel ischemia Vascular: No hyperdense vessel or unexpected calcification. Skull: Negative for fracture Sinuses/Orbits: No visible injury CT CERVICAL SPINE FINDINGS Alignment: Slight degenerative anterolisthesis at C4-5 Skull base and vertebrae: No acute fracture. No primary bone lesion or focal pathologic process. Soft tissues and spinal canal: No prevertebral fluid or swelling. No visible canal hematoma. Disc levels: Ordinary degenerative changes.A right-sided laminectomy has been performed at C6-7. Upper chest: No acute finding.  Mild biapical scarring IMPRESSION: 1. No evidence of acute intracranial or cervical spine injury. 2. Atrophy in keeping with history of dementia. Electronically Signed   By: Monte Fantasia M.D.   On: 06/29/2019 10:44   DG FEMUR MIN 2 VIEWS LEFT  Result Date: 06/29/2019 CLINICAL DATA:  [place]. Left femur pain. EXAM: LEFT FEMUR 2 VIEWS COMPARISON:  None. FINDINGS: The left hip and left knee joints are maintained. No acute femur fracture. IMPRESSION: No acute bony findings. Electronically Signed   By: Marijo Sanes M.D.   On: 06/29/2019 10:27    Assessment & Plan:   Problem List Items Addressed This Visit      Unprioritized   Venous insufficiency of both lower extremities - Primary    Recommend use of compression knee highs.  DME order written and order for facility to don stockings QAM and remove QPM       Relevant Orders   For home use only DME Other see comment   Moderate dementia with  behavioral disturbance ([place])   Relevant Orders   Ambulatory referral to [facility]   Long term current use of anticoagulant therapy   Relevant Orders   Ambulatory referral to [facility]   Insomnia due to other mental disorder    Continue mirtazipine ,  Prn alprazolam       GAD (generalized anxiety disorder)    Improved mood with change from lexapro to paxil .  No changes today.       Acquired thrombophilia ([place])    takign xarelto for embolic stroke risk mitigation          I have discontinued Emoree F. Stalker's pravastatin. I am also having her maintain her ondansetron, metoprolol tartrate, rivastigmine, cholecalciferol, donepezil, furosemide, Xarelto, Cranberry, mometasone, levothyroxine, losartan, esomeprazole, acetaminophen, PARoxetine, mirtazapine, ALPRAZolam, ciprofloxacin, and bifidobacterium infantis. We will continue to administer cyanocobalamin.  No orders of  the defined types were placed in this encounter.   Medications Discontinued During This Encounter  Medication Reason  . pravastatin (PRAVACHOL) 20 MG tablet     Follow-up: No follow-ups on file.   Crecencio Mc, MD

## 2019-10-17 NOTE — Assessment & Plan Note (Signed)
Continue mirtazipine ,  Prn alprazolam

## 2019-10-17 NOTE — Assessment & Plan Note (Signed)
Improved mood with change from lexapro to paxil .  No changes today.

## 2019-10-18 ENCOUNTER — Ambulatory Visit (INDEPENDENT_AMBULATORY_CARE_PROVIDER_SITE_OTHER): Payer: Medicare Other | Admitting: Pharmacist

## 2019-10-18 DIAGNOSIS — K219 Gastro-esophageal reflux disease without esophagitis: Secondary | ICD-10-CM

## 2019-10-18 DIAGNOSIS — F03B18 Unspecified dementia, moderate, with other behavioral disturbance: Secondary | ICD-10-CM

## 2019-10-18 DIAGNOSIS — F0391 Unspecified dementia with behavioral disturbance: Secondary | ICD-10-CM

## 2019-10-18 DIAGNOSIS — I4891 Unspecified atrial fibrillation: Secondary | ICD-10-CM

## 2019-10-18 MED ORDER — PANTOPRAZOLE SODIUM 40 MG PO TBEC: 40.0000 mg | DELAYED_RELEASE_TABLET | Freq: Two times a day (BID) | ORAL | 3 refills | Status: DC

## 2019-10-18 NOTE — Chronic Care Management (AMB) (Signed)
**Note Alexandra-Identified via Obfuscation** Chronic Care Management   Note  10/18/2019 Name: Alexandra Martinez MRN: [id number] DOB: 1930-03-27   Subjective:  Alexandra Martinez is a 86 y.o. year old female who is a primary care patient of Tullo, Aris Everts, MD. The CCM team was consulted for assistance with chronic disease management and care coordination needs.     Alexandra Martinez 's niece, Alexandra Martinez (on [place]), was given information about Chronic Care Management services today including:  1. CCM service includes personalized support from designated clinical staff supervised by her physician, including individualized plan of care and coordination with other care providers 2. 24/7 contact phone numbers for assistance for urgent and routine care needs. 3. Service will only be billed when office clinical staff spend 20 minutes or more in a month to coordinate care. 4. Only one practitioner may furnish and bill the service in a calendar month. 5. The patient may stop CCM services at any time (effective at the end of the month) by phone call to the office staff. 6. The patient will be responsible for cost sharing (co-pay) of up to 20% of the service fee (after annual deductible is met).  Patient agreed to services and verbal consent obtained.   Review of patient status, including review of consultants reports, laboratory and other test data, was performed as part of comprehensive evaluation and provision of chronic care management services.   SDOH (Social Determinants of Health) assessments and interventions performed:  SDOH Interventions     Most Recent Value  SDOH Interventions  SDOH Interventions for the Following Domains Financial Strain  Financial Strain Interventions Other (Comment)  [patient assistance evaluation]     yes  Objective:  Lab Results  Component Value Date   CREATININE 1.00 06/29/2019   CREATININE 1.29 (H) 03/27/2019   CREATININE 1.27 (H) 09/19/2018    No results found for: HGBA1C     Component Value Date/Time   CHOL  217 (H) 07/16/2013 1004   TRIG 88.0 07/16/2013 1004   HDL 41.70 07/16/2013 1004   CHOLHDL 5 07/16/2013 1004   VLDL 17.6 07/16/2013 1004   LDLCALC 158 (H) 07/16/2013 1004    Clinical ASCVD: No  The ASCVD Risk score Alexandra Martinez DC Jr., et al., 2013) failed to calculate for the following reasons:   The 2013 ASCVD risk score is only valid for ages 51 to 69    BP Readings from Last 3 Encounters:  08/29/19 138/76  07/17/19 118/68  06/29/19 (!) 148/86    Allergies  Allergen Reactions  . Celebrex [Celecoxib] Other (See Comments)  . Donepezil Other (See Comments)    Stomach pains  . Loratadine-Pseudoephedrine Er     Other reaction(s): Unknown  . Venlafaxine Other (See Comments)  . Vioxx [Rofecoxib] Other (See Comments)  . Codeine Itching and Rash  . Dexilant [Dexlansoprazole] Rash  . Eliquis [Apixaban] Rash    Medications Reviewed Today    Reviewed by Alexandra Martinez, Alexandra Martinez (Pharmacist) on 10/18/19 at 1158  Med List Status: <None>  Medication Order Taking? Sig Documenting Provider Last Dose Status Informant  acetaminophen (TYLENOL) 500 MG tablet [id number]  Take 2 tablets (1,000 mg total) by mouth every 12 (twelve) hours. Alexandra Mc, MD  Active   ALPRAZolam [name]) 0.25 MG tablet [id number] Yes Take 1 tablet (0.25 mg total) by mouth at bedtime. Alexandra Mc, MD Taking Active   bifidobacterium infantis (ALIGN) capsule [id number] Yes Take 1 capsule by mouth daily. Alexandra Mc, MD Taking Active  Discontinued 10/18/19 1157 (Change in therapy)   levothyroxine (SYNTHROID) 50 MCG tablet [id number] Yes TAKE ONE TABLET EVERY DAY Alexandra Mc, MD Taking Active   losartan (COZAAR) 100 MG tablet [id number] Yes TAKE 1 TABLET BY MOUTH DAILY Alexandra Mc, MD Taking Active        Patient taking differently:      Discontinued 10/18/19 1158 (Change in therapy)   metoprolol tartrate (LOPRESSOR) 50 MG tablet [id number] Yes Take 50 mg by mouth 2 (two) times daily. [provider] Taking Active   mirtazapine (REMERON) 15 MG tablet [id number] Yes Take 1 tablet (15 mg total) by mouth at bedtime. Alexandra Mc, MD Taking Active        Patient not taking:      Discontinued 10/18/19 1158 (Change in therapy)        Patient not taking:      Discontinued 10/18/19 1158 (Change in therapy)   pantoprazole (PROTONIX) 40 MG tablet [id number]  Take 1 tablet (40 mg total) by mouth in the morning and at bedtime. Alexandra Mc, MD  Active   PARoxetine (PAXIL) 10 MG tablet [id number] Yes Take 1 tablet (10 mg total) by mouth daily. Alexandra Mc, MD Taking Active   rivastigmine (EXELON) 3 MG capsule [id number] Yes Take 1 capsule (3 mg total) by mouth 2 (two) times daily. Alexandra Sprang, MD Taking Active   XARELTO 15 MG TABS tablet [id number] Yes Take 15 mg by mouth daily. [provider] Taking Active            Assessment:   Goals Addressed              This Visit's Progress     Patient Stated   .  Alexandra Martinez "She can't afford this medication" (pt-stated)        CARE PLAN ENTRY (see longitudinal plan of care for additional care plan information)  Current Barriers:  . Financial barriers: patient's niece, Alexandra Martinez, reports problems affording Xarelto.  . Patient lives at [facility]. Notes that cost of medication is automatically billed to credit card, so Alexandra Martinez is unsure exactly how much  . Polypharmacy; complex patient with multiple comorbidities including Afib, HTN, dementia, CKD, GAD . Most recent eGFR: ~41 mL/min (Care Everywhere per nephrology) o Afib/HTN: Alexandra Martinez; Xarelto 15 mg daily, metoprolol tartrate 50 mg BID, losartan 100 mg daily; Xarelto cost is ~$50/28 days o Dementia: rivastigmine 3 mg BID; per pharmacy, cost is $43.68/26 days o Depression/anxiety: paroxetine 10 mg daily, mirtazapine 15 mg daily, alprazolam 0.25 mg daily o GERD: esomeprazole 40 mg BID - pharmacy reports cost is $50/28 days o Hypothyroidism: levothyroxine 50 mcg  daily  Pharmacist Clinical Goal(s):  Marland Kitchen Over the next 90 days, patient will work with Alexandra Martinez and provider towards optimized medication management  Interventions: . Comprehensive medication review performed via fill history; medication list updated in electronic medical record . Inter-disciplinary care team collaboration (see longitudinal plan of care) . Reviewed formulary. Pantoprazole is tier 2 preferred. Collaborated w/ Dr. Derrel Nip, received verbal permission to d/c esomeprazole and start pantoprazole. Script sent . Will attempt Tier Exception for rivastigmine, as patient has allergy to preferred generic donepezil . Discussed Xarelto patient assistance program. Patient qualifies for income criteria, but unsure what her total out of pocket spend is yet. Alexandra Martinez will investigate patient's out of pocket spend on EOB. Will collaborate w/ CPhT to mail patient portion of application  Patient Self Care Activities:  . Patient will take  medications as prescribed  Initial goal documentation        Plan: - Will collaborate w/ patient, provider, and CPhT as above  Catie Darnelle Maffucci, Alexandra Martinez, [place], [place] [phone]

## 2019-10-18 NOTE — Patient Instructions (Addendum)
[name],   It was great talking to you today!  Here is what we talked about:   1) Xarelto cost - Ms. Astorino meets the income requirement. The second requirement is that she spend 4% of her total household income (which is ~$600) on copays on all of her medications for the calendar year. This total can be found on her Explanation of Benefits that [employer] should mail every month. I'm going to have my pharmacy technician, Susy Frizzle, mail you the patient portion of the application. If you could mail back (or drop back off here at Dr. Lupita Dawn office) the signed application, a copy of Ms. Bernat' 2020 tax return, and a copy of her most recent Explanation of Benefits, showing her total out of pocket spend on her drugs, we will submit to the drug company on your behalf.   2) Her heartburn medication, esomeprazole (Nexium) was expensive. Dr. Derrel Nip and I switched it to pantoprazole (Protonix), which is a similar medication and will be cheaper.   3) Dr. Derrel Nip and I are going to see if her dementia medication, rivastigmine (Exelon), can be covered at a lower tier. We'll work on that appeal to [employer].    Call me with any questions!  Catie Darnelle Maffucci, PharmD [phone]  Visit Information  Goals Addressed              This Visit's Progress     Patient Stated   .  PharmD "She can't afford this medication" (pt-stated)        CARE PLAN ENTRY (see longitudinal plan of care for additional care plan information)  Current Barriers:  . Financial barriers: patient's niece, [name], reports problems affording Xarelto.  . Patient lives at [facility]. Notes that cost of medication is automatically billed to credit card, so [name] is unsure exactly how much  . Polypharmacy; complex patient with multiple comorbidities including Afib, HTN, dementia, CKD, GAD . Most recent eGFR: ~41 mL/min (Care Everywhere per nephrology) o Afib/HTN: Dr. [name]; Xarelto 15 mg daily,  metoprolol tartrate 50 mg BID, losartan 100 mg daily; Xarelto cost is ~$50/28 days o Dementia: rivastigmine 3 mg BID; per pharmacy, cost is $43.68/26 days o Depression/anxiety: paroxetine 10 mg daily, mirtazapine 15 mg daily, alprazolam 0.25 mg daily o GERD: esomeprazole 40 mg BID - pharmacy reports cost is $50/28 days o Hypothyroidism: levothyroxine 50 mcg daily  Pharmacist Clinical Goal(s):  Marland Kitchen Over the next 90 days, patient will work with PharmD and provider towards optimized medication management  Interventions: . Comprehensive medication review performed via fill history; medication list updated in electronic medical record . Inter-disciplinary care team collaboration (see longitudinal plan of care) . Reviewed formulary. Pantoprazole is tier 2 preferred. Collaborated w/ Dr. Derrel Nip, received verbal permission to d/c esomeprazole and start pantoprazole. Script sent . Will attempt Tier Exception for rivastigmine, as patient has allergy to preferred generic donepezil . Discussed Xarelto patient assistance program. Patient qualifies for income criteria, but unsure what her total out of pocket spend is yet. [name] will investigate patient's out of pocket spend on EOB. Will collaborate w/ CPhT to mail patient portion of application  Patient Self Care Activities:  . Patient will take medications as prescribed  Initial goal documentation        Ms. Griffy was given information about Chronic Care Management services today including:  1. CCM service includes personalized support from designated clinical staff supervised by her physician, including individualized plan of care and coordination with other care providers  2. 24/7 contact phone numbers for assistance for urgent and routine care needs. 3. Service will only be billed when office clinical staff spend 20 minutes or more in a month to coordinate care. 4. Only one practitioner may furnish and bill the service in a calendar month. 5. The  patient may stop CCM services at any time (effective at the end of the month) by phone call to the office staff. 6. The patient will be responsible for cost sharing (co-pay) of up to 20% of the service fee (after annual deductible is met).  Patient agreed to services and verbal consent obtained.   The patient verbalized understanding of instructions provided today and agreed to receive a mailed copy of patient instruction and/or educational materials.  Plan: - Will collaborate w/ patient, provider, and CPhT as above  Catie Darnelle Maffucci, PharmD, [place], [place] [phone]

## 2019-10-23 ENCOUNTER — Other Ambulatory Visit: Payer: Self-pay | Admitting: Pharmacy Technician

## 2019-10-23 NOTE — Patient Outreach (Signed)
[facility]) Care Management  10/23/2019  Alexandra Martinez [birth year] [id number]                                      Medication Assistance Referral  Referral From: [facility] Embedded RPh Catie T.   Medication/Company: [name] / [employer] Patient application portion:  Mailed Provider application portion:  N/A Embedded RPh to have signed while in clinic to Dr. Deborra Medina Provider address/fax verified via: Office website   Follow up:  Will follow up with patient in 5-15 business days to confirm application(s) have been received.  Verl Whitmore P. Amiayah Giebel, [place]  [phone]

## 2019-10-25 ENCOUNTER — Ambulatory Visit: Payer: Self-pay | Admitting: Pharmacist

## 2019-10-25 DIAGNOSIS — I4891 Unspecified atrial fibrillation: Secondary | ICD-10-CM

## 2019-10-25 DIAGNOSIS — F0391 Unspecified dementia with behavioral disturbance: Secondary | ICD-10-CM | POA: Diagnosis not present

## 2019-10-25 DIAGNOSIS — F03B18 Unspecified dementia, moderate, with other behavioral disturbance: Secondary | ICD-10-CM

## 2019-10-25 NOTE — Chronic Care Management (AMB) (Signed)
Chronic Care Management   Follow Up Note   10/25/2019 Name: Alexandra Martinez MRN: [id number] DOB: December 11, 1929  Referred by: Crecencio Mc, MD Reason for referral : Chronic Care Management (Medication Management)   Alexandra Martinez is a 86 y.o. year old female who is a primary care patient of Tullo, Aris Everts, MD. The CCM team was consulted for assistance with chronic disease management and care coordination needs.    Care coordination completed today  Review of patient status, including review of consultants reports, relevant laboratory and other test results, and collaboration with appropriate care team members and the patient's provider was performed as part of comprehensive patient evaluation and provision of chronic care management services.    SDOH (Social Determinants of Health) assessments performed: Yes See Care Plan activities for detailed interventions related to SDOH)  SDOH Interventions     Most Recent Value  SDOH Interventions  Financial Strain Interventions Other (Comment)  [patient assistance collaboration]       Outpatient Encounter Medications as of 10/25/2019  Medication Sig  . acetaminophen (TYLENOL) 500 MG tablet Take 2 tablets (1,000 mg total) by mouth every 12 (twelve) hours.  . ALPRAZolam (XANAX) 0.25 MG tablet Take 1 tablet (0.25 mg total) by mouth at bedtime.  . bifidobacterium infantis (ALIGN) capsule Take 1 capsule by mouth daily.  Marland Kitchen levothyroxine (SYNTHROID) 50 MCG tablet TAKE ONE TABLET EVERY DAY  . losartan (COZAAR) 100 MG tablet TAKE 1 TABLET BY MOUTH DAILY  . metoprolol tartrate (LOPRESSOR) 50 MG tablet Take 50 mg by mouth 2 (two) times daily.  . mirtazapine (REMERON) 15 MG tablet Take 1 tablet (15 mg total) by mouth at bedtime.  . pantoprazole (PROTONIX) 40 MG tablet Take 1 tablet (40 mg total) by mouth in the morning and at bedtime.  Marland Kitchen PARoxetine (PAXIL) 10 MG tablet Take 1 tablet (10 mg total) by mouth daily.  . rivastigmine (EXELON) 3 MG capsule Take  1 capsule (3 mg total) by mouth 2 (two) times daily.  [name] 15 MG TABS tablet Take 15 mg by mouth daily.   No facility-administered encounter medications on file as of 10/25/2019.     Objective:   Goals Addressed              This Visit's Progress     Patient Stated   .  PharmD "She can't afford this medication" (pt-stated)        CARE PLAN ENTRY (see longitudinal plan of care for additional care plan information)  Current Barriers:  . Financial barriers: patient's niece, [name], reports problems affording Xarelto and rivastigmine o [name] calls today with questions about requirements for Xarelto assistance . Polypharmacy; complex patient with multiple comorbidities including Afib, HTN, dementia, CKD, GAD . Most recent eGFR: ~41 mL/min (Care Everywhere per nephrology) o Afib/HTN: Dr. [name]; Xarelto 15 mg daily, metoprolol tartrate 50 mg BID, losartan 100 mg daily; Xarelto cost is ~$50/28 days - Working on patient assistance for Xarelto w/ CPhT o Dementia: rivastigmine 3 mg BID; per pharmacy, cost is $43.68/26 days - Hx donepezil, significant abdominal pain w/ this medication o Depression/anxiety: paroxetine 10 mg daily, mirtazapine 15 mg daily, alprazolam 0.25 mg daily o GERD: pantoprazole 40 mg BID o Hypothyroidism: levothyroxine 50 mcg daily  Pharmacist Clinical Goal(s):  Marland Kitchen Over the next 90 days, patient will work with PharmD and provider towards optimized medication management  Interventions: . Comprehensive medication review performed via fill history; medication list updated in electronic medical record . Inter-disciplinary  care team collaboration (see longitudinal plan of care) . Completing Tier Exception request for rivastigmine. Faxed to [facility] today . Reviewed needs to complete Xarelto assistance. Delrae Sawyers understanding  Patient Self Care Activities:  . Patient will take medications as prescribed  Please see past updates related to this goal by  clicking on the "Past Updates" button in the selected goal          Plan:  - Will continue to collaborate w/ patient and provider as above  Catie Darnelle Maffucci, PharmD, [place], [place] Pharmacist [place] [phone]

## 2019-10-25 NOTE — Patient Instructions (Signed)
Visit Information  Goals Addressed              This Visit's Progress     Patient Stated   .  PharmD "She can't afford this medication" (pt-stated)        CARE PLAN ENTRY (see longitudinal plan of care for additional care plan information)  Current Barriers:  . Financial barriers: patient's niece, [name], reports problems affording Xarelto and rivastigmine o [name] calls today with questions about requirements for Xarelto assistance . Polypharmacy; complex patient with multiple comorbidities including Afib, HTN, dementia, CKD, GAD . Most recent eGFR: ~41 mL/min (Care Everywhere per nephrology) o Afib/HTN: Dr. [name]; Xarelto 15 mg daily, metoprolol tartrate 50 mg BID, losartan 100 mg daily; Xarelto cost is ~$50/28 days - Working on patient assistance for Xarelto w/ CPhT o Dementia: rivastigmine 3 mg BID; per pharmacy, cost is $43.68/26 days - Hx donepezil, significant abdominal pain w/ this medication o Depression/anxiety: paroxetine 10 mg daily, mirtazapine 15 mg daily, alprazolam 0.25 mg daily o GERD: pantoprazole 40 mg BID o Hypothyroidism: levothyroxine 50 mcg daily  Pharmacist Clinical Goal(s):  Marland Kitchen Over the next 90 days, patient will work with PharmD and provider towards optimized medication management  Interventions: . Comprehensive medication review performed via fill history; medication list updated in electronic medical record . Inter-disciplinary care team collaboration (see longitudinal plan of care) . Completing Tier Exception request for rivastigmine. Faxed to [facility] today . Reviewed needs to complete Xarelto assistance. Delrae Sawyers understanding  Patient Self Care Activities:  . Patient will take medications as prescribed  Please see past updates related to this goal by clicking on the "Past Updates" button in the selected goal         The patient verbalized understanding of instructions provided today and declined a print copy of patient instruction  materials.   Plan:  - Will continue to collaborate w/ patient and provider as above  Catie Darnelle Maffucci, PharmD, [place], [place] Pharmacist [place] [phone]

## 2019-10-26 ENCOUNTER — Ambulatory Visit: Payer: Self-pay | Admitting: Pharmacist

## 2019-10-26 DIAGNOSIS — F0391 Unspecified dementia with behavioral disturbance: Secondary | ICD-10-CM | POA: Diagnosis not present

## 2019-10-26 DIAGNOSIS — I4891 Unspecified atrial fibrillation: Secondary | ICD-10-CM

## 2019-10-26 DIAGNOSIS — F03B18 Unspecified dementia, moderate, with other behavioral disturbance: Secondary | ICD-10-CM

## 2019-10-26 NOTE — Patient Instructions (Signed)
Visit Information  Goals Addressed              This Visit's Progress     Patient Stated   .  PharmD "She can't afford this medication" (pt-stated)        CARE PLAN ENTRY (see longitudinal plan of care for additional care plan information)  Current Barriers:  . Financial barriers: patient's niece, Alexandra Martinez, reports problems affording Xarelto and rivastigmine o Alexandra Martinez calls today with questions about patient's list of medications from the [facility] pharmacy. She's worried about medication costs. Particularly concerned that she is being charged for packaging acetaminophen, even though Alexandra Martinez took a bottle to the facility . Polypharmacy; complex patient with multiple comorbidities including Afib, HTN, dementia, CKD, GAD . Most recent eGFR: ~41 mL/min (Care Everywhere per nephrology) o Afib/HTN: Dr. [name]; Xarelto 15 mg daily, metoprolol tartrate 50 mg BID, losartan 100 mg daily; Xarelto cost is ~$50/28 days - Working on patient assistance for Xarelto w/ CPhT o Dementia: rivastigmine 3 mg BID; per pharmacy, cost is $43.68/26 days - Hx donepezil, significant abdominal pain w/ this medication o Depression/anxiety: paroxetine 10 mg daily, mirtazapine 15 mg daily, alprazolam 0.25 mg daily o GERD: pantoprazole 40 mg BID o Hypothyroidism: levothyroxine 50 mcg daily  Pharmacist Clinical Goal(s):  Marland Kitchen Over the next 90 days, patient will work with PharmD and provider towards optimized medication management  Interventions: . Comprehensive medication review performed via fill history; medication list updated in electronic medical record . Inter-disciplinary care team collaboration (see longitudinal plan of care) . Encouraged Alexandra Martinez to call the facility to discuss procedures regarding using the patient's home bottle of APAP vs pharmacy filling and charging. Reviewed that we are working on Xarelto assistance and [place] for rivastigmine. Alexandra Martinez verbalizes understanding.   Patient Self Care Activities:    . Patient will take medications as prescribed  Please see past updates related to this goal by clicking on the "Past Updates" button in the selected goal         The patient verbalized understanding of instructions provided today and declined a print copy of patient instruction materials.   Plan:  - Will outreach as previously scheduled  Catie Darnelle Maffucci, PharmD, [place], [place] Pharmacist [place] [phone]

## 2019-10-26 NOTE — Chronic Care Management (AMB) (Signed)
Chronic Care Management   Follow Up Note   10/26/2019 Name: Alexandra Martinez MRN: [id number] DOB: 02-27-1930  Referred by: Crecencio Mc, MD Reason for referral : Chronic Care Management (Medication Management)   Alexandra Martinez is a 86 y.o. year old female who is a primary care patient of Tullo, Aris Everts, MD. The CCM team was consulted for assistance with chronic disease management and care coordination needs.    Received call from patient's niece, Alexandra Martinez, today.   Review of patient status, including review of consultants reports, relevant laboratory and other test results, and collaboration with appropriate care team members and the patient's provider was performed as part of comprehensive patient evaluation and provision of chronic care management services.    SDOH (Social Determinants of Health) assessments performed: Yes See Care Plan activities for detailed interventions related to SDOH)  SDOH Interventions     Most Recent Value  SDOH Interventions  Financial Strain Interventions Other (Comment)  [medication assistance evaluation]       Outpatient Encounter Medications as of 10/26/2019  Medication Sig  . acetaminophen (TYLENOL) 500 MG tablet Take 2 tablets (1,000 mg total) by mouth every 12 (twelve) hours.  . ALPRAZolam (XANAX) 0.25 MG tablet Take 1 tablet (0.25 mg total) by mouth at bedtime.  . bifidobacterium infantis (ALIGN) capsule Take 1 capsule by mouth daily.  Marland Kitchen levothyroxine (SYNTHROID) 50 MCG tablet TAKE ONE TABLET EVERY DAY  . losartan (COZAAR) 100 MG tablet TAKE 1 TABLET BY MOUTH DAILY  . metoprolol tartrate (LOPRESSOR) 50 MG tablet Take 50 mg by mouth 2 (two) times daily.  . mirtazapine (REMERON) 15 MG tablet Take 1 tablet (15 mg total) by mouth at bedtime.  . pantoprazole (PROTONIX) 40 MG tablet Take 1 tablet (40 mg total) by mouth in the morning and at bedtime.  Marland Kitchen PARoxetine (PAXIL) 10 MG tablet Take 1 tablet (10 mg total) by mouth daily.  . rivastigmine (EXELON)  3 MG capsule Take 1 capsule (3 mg total) by mouth 2 (two) times daily.  [name] 15 MG TABS tablet Take 15 mg by mouth daily.   No facility-administered encounter medications on file as of 10/26/2019.     Objective:   Goals Addressed              This Visit's Progress     Patient Stated   .  PharmD "She can't afford this medication" (pt-stated)        CARE PLAN ENTRY (see longitudinal plan of care for additional care plan information)  Current Barriers:  . Financial barriers: patient's niece, Alexandra Martinez, reports problems affording Xarelto and rivastigmine o Alexandra Martinez calls today with questions about patient's list of medications from the SNF pharmacy. She's worried about medication costs. Particularly concerned that she is being charged for packaging acetaminophen, even though Alexandra Martinez took a bottle to the facility . Polypharmacy; complex patient with multiple comorbidities including Afib, HTN, dementia, CKD, GAD . Most recent eGFR: ~41 mL/min (Care Everywhere per nephrology) o Afib/HTN: Dr. [name]; Xarelto 15 mg daily, metoprolol tartrate 50 mg BID, losartan 100 mg daily; Xarelto cost is ~$50/28 days - Working on patient assistance for Xarelto w/ CPhT o Dementia: rivastigmine 3 mg BID; per pharmacy, cost is $43.68/26 days - Hx donepezil, significant abdominal pain w/ this medication o Depression/anxiety: paroxetine 10 mg daily, mirtazapine 15 mg daily, alprazolam 0.25 mg daily o GERD: pantoprazole 40 mg BID o Hypothyroidism: levothyroxine 50 mcg daily  Pharmacist Clinical Goal(s):  Marland Kitchen Over the next  90 days, patient will work with PharmD and provider towards optimized medication management  Interventions: . Comprehensive medication review performed via fill history; medication list updated in electronic medical record . Inter-disciplinary care team collaboration (see longitudinal plan of care) . Encouraged Alexandra Martinez to call the facility to discuss procedures regarding using the patient's home  bottle of APAP vs pharmacy filling and charging. Reviewed that we are working on Xarelto assistance and [place] for rivastigmine. Alexandra Martinez verbalizes understanding.   Patient Self Care Activities:  . Patient will take medications as prescribed  Please see past updates related to this goal by clicking on the "Past Updates" button in the selected goal          Plan:  - Will outreach as previously scheduled  Catie Darnelle Maffucci, PharmD, [place], [place] Pharmacist [facility] [place] [phone]

## 2019-10-29 ENCOUNTER — Telehealth: Payer: Self-pay | Admitting: Internal Medicine

## 2019-10-29 NOTE — Telephone Encounter (Signed)
D/c order has been faxed.

## 2019-10-29 NOTE — Telephone Encounter (Signed)
[facility] needs clarification on 2 medications. They are showing pt is taking Protonix 40 mg and esomeprazole 40mg . They want to know if one of these needs to be discontinued. If so please fax this to [phone].

## 2019-10-29 NOTE — Telephone Encounter (Signed)
According to pt's chart she is only taking the protonix.

## 2019-11-01 ENCOUNTER — Telehealth: Payer: Self-pay | Admitting: Internal Medicine

## 2019-11-01 MED ORDER — ONDANSETRON 4 MG PO TBDP: 4.0000 mg | ORAL_TABLET | Freq: Three times a day (TID) | ORAL | 0 refills | Status: AC | PRN

## 2019-11-01 MED ORDER — ALUM & MAG HYDROXIDE-SIMETH 400-400-40 MG/5ML PO SUSP: 10.0000 mL | Freq: Three times a day (TID) | ORAL | 0 refills | Status: DC | PRN

## 2019-11-01 NOTE — Telephone Encounter (Signed)
Faxed

## 2019-11-01 NOTE — Telephone Encounter (Signed)
Letter printed.  r x's sent to [employer]

## 2019-11-01 NOTE — Telephone Encounter (Signed)
[facility] called need a order for medication for pt nausea/gas from [place] faxed number is [phone]

## 2019-11-06 ENCOUNTER — Other Ambulatory Visit: Payer: Self-pay | Admitting: Pharmacy Technician

## 2019-11-06 NOTE — Patient Outreach (Signed)
[facility] [facility]) Care Management  11/06/2019  PHILOMENA BUTTERMORE [birth year] [id number]   Received both patient and provider portion(s) of patient assistance application(s) for Xarelto. Faxed completed application and required documents into [employer].  Will follow up with company(ies) in 10-14 business days to check status of application(s).  Zelphia Glover P. Deniece Rankin, [place]  [phone]

## 2019-11-07 ENCOUNTER — Other Ambulatory Visit: Payer: Self-pay | Admitting: Internal Medicine

## 2019-11-07 MED ORDER — ALPRAZOLAM 0.25 MG PO TABS: 0.2500 mg | ORAL_TABLET | Freq: Every day | ORAL | 5 refills | Status: DC

## 2019-11-07 NOTE — Telephone Encounter (Signed)
Printed alprazolam rx for #30 with 5 refills,  Sending via fax to facility per their request

## 2019-11-07 NOTE — Telephone Encounter (Signed)
Faxed

## 2019-11-08 ENCOUNTER — Ambulatory Visit: Payer: Self-pay | Admitting: Pharmacist

## 2019-11-08 DIAGNOSIS — I4891 Unspecified atrial fibrillation: Secondary | ICD-10-CM

## 2019-11-08 DIAGNOSIS — F03B18 Unspecified dementia, moderate, with other behavioral disturbance: Secondary | ICD-10-CM

## 2019-11-08 DIAGNOSIS — F0391 Unspecified dementia with behavioral disturbance: Secondary | ICD-10-CM

## 2019-11-08 NOTE — Patient Instructions (Signed)
Visit Information  Goals Addressed              This Visit's Progress     Patient Stated   .  PharmD "She can't afford this medication" (pt-stated)        CARE PLAN ENTRY (see longitudinal plan of care for additional care plan information)  Current Barriers:  . Financial barriers: patient's niece, Alexandra Martinez, reports problems affording Xarelto and rivastigmine o F/u regarding Tier Exception request for rivastigmine o Xarelto assistance application submitted earlier this week by CPhT . Polypharmacy; complex patient with multiple comorbidities including Afib, HTN, dementia, CKD, GAD . Most recent eGFR: ~41 mL/min (Care Everywhere per nephrology) o Afib/HTN: Dr. [name]; Xarelto 15 mg daily, metoprolol tartrate 50 mg BID, losartan 100 mg daily - Working on patient assistance for Xarelto w/ CPhT o Dementia: rivastigmine 3 mg BID; - Hx donepezil, significant abdominal pain w/ this medication o Depression/anxiety: paroxetine 10 mg daily, mirtazapine 15 mg daily, alprazolam 0.25 mg daily o GERD: pantoprazole 40 mg BID o Hypothyroidism: levothyroxine 50 mcg daily  Pharmacist Clinical Goal(s):  Marland Kitchen Over the next 90 days, patient will work with PharmD and provider towards optimized medication management  Interventions: . Comprehensive medication review performed via fill history; medication list updated in electronic medical record . Inter-disciplinary care team collaboration (see longitudinal plan of care) . Contacted Alexandra Martinez. She notes that she received notice of denial of the Tier Exception request. Appreciative of the help.   Patient Self Care Activities:  . Patient will take medications as prescribed  Please see past updates related to this goal by clicking on the "Past Updates" button in the selected goal         The patient verbalized understanding of instructions provided today and declined a print copy of patient instruction materials.   Plan:  - Will outreach as previously  scheduled  Catie Darnelle Maffucci, PharmD, [place], [place] Pharmacist [place] [phone]

## 2019-11-08 NOTE — Chronic Care Management (AMB) (Addendum)
Chronic Care Management   Follow Up Note   11/08/2019 Name: LYNNE TAKEMOTO MRN: [id number] DOB: 1929/07/01  Referred by: Crecencio Mc, MD Reason for referral : Chronic Care Management (Medication Management)   KASH MOTHERSHEAD is a 86 y.o. year old female who is a primary care patient of Tullo, Aris Everts, MD. The CCM team was consulted for assistance with chronic disease management and care coordination needs.    Contacted patient's caregiver for medication management f/u.   Review of patient status, including review of consultants reports, relevant laboratory and other test results, and collaboration with appropriate care team members and the patient's provider was performed as part of comprehensive patient evaluation and provision of chronic care management services.    SDOH (Social Determinants of Health) assessments performed: Yes See Care Plan activities for detailed interventions related to [facility])     Outpatient Encounter Medications as of 11/08/2019  Medication Sig  . acetaminophen (TYLENOL) 500 MG tablet Take 2 tablets (1,000 mg total) by mouth every 12 (twelve) hours.  . ALPRAZolam (XANAX) 0.25 MG tablet Take 1 tablet (0.25 mg total) by mouth at bedtime.  Marland Kitchen alum & mag hydroxide-simeth (MAALOX PLUS) 400-400-40 MG/5ML suspension Take 10 mLs by mouth every 8 (eight) hours as needed for indigestion.  . bifidobacterium infantis (ALIGN) capsule Take 1 capsule by mouth daily.  Marland Kitchen levothyroxine (SYNTHROID) 50 MCG tablet TAKE ONE TABLET EVERY DAY  . losartan (COZAAR) 100 MG tablet TAKE 1 TABLET BY MOUTH DAILY  . metoprolol tartrate (LOPRESSOR) 50 MG tablet Take 50 mg by mouth 2 (two) times daily.  . mirtazapine (REMERON) 15 MG tablet Take 1 tablet (15 mg total) by mouth at bedtime.  . ondansetron (ZOFRAN ODT) 4 MG disintegrating tablet Take 1 tablet (4 mg total) by mouth every 8 (eight) hours as needed for nausea or vomiting.  . pantoprazole (PROTONIX) 40 MG tablet Take 1 tablet (40 mg  total) by mouth in the morning and at bedtime.  Marland Kitchen PARoxetine (PAXIL) 10 MG tablet Take 1 tablet (10 mg total) by mouth daily.  . rivastigmine (EXELON) 3 MG capsule Take 1 capsule (3 mg total) by mouth 2 (two) times daily.  [name] 15 MG TABS tablet Take 15 mg by mouth daily.   No facility-administered encounter medications on file as of 11/08/2019.     Objective:   Goals Addressed              This Visit's Progress     Patient Stated   .  PharmD "She can't afford this medication" (pt-stated)        CARE PLAN ENTRY (see longitudinal plan of care for additional care plan information)  Current Barriers:  . Financial barriers: patient's niece, [name], reports problems affording Xarelto and rivastigmine o F/u regarding Tier Exception request for rivastigmine o Xarelto assistance application submitted earlier this week by CPhT . Polypharmacy; complex patient with multiple comorbidities including Afib, HTN, dementia, CKD, GAD . Most recent eGFR: ~41 mL/min (Care Everywhere per nephrology) o Afib/HTN: Dr. [name]; Xarelto 15 mg daily, metoprolol tartrate 50 mg BID, losartan 100 mg daily - Working on patient assistance for Xarelto w/ CPhT o Dementia: rivastigmine 3 mg BID; - Hx donepezil, significant abdominal pain w/ this medication o Depression/anxiety: paroxetine 10 mg daily, mirtazapine 15 mg daily, alprazolam 0.25 mg daily o GERD: pantoprazole 40 mg BID o Hypothyroidism: levothyroxine 50 mcg daily  Pharmacist Clinical Goal(s):  Marland Kitchen Over the next 90 days, patient will work with  PharmD and provider towards optimized medication management  Interventions: . Comprehensive medication review performed via fill history; medication list updated in electronic medical record . Inter-disciplinary care team collaboration (see longitudinal plan of care) . Contacted [name]. She notes that she received notice of denial of the Tier Exception request. Appreciative of the help.   Patient Self  Care Activities:  . Patient will take medications as prescribed  Please see past updates related to this goal by clicking on the "Past Updates" button in the selected goal          Plan:  - Will outreach as previously scheduled  Catie Darnelle Maffucci, PharmD, [place], [place] Pharmacist [place] [phone]

## 2019-11-20 ENCOUNTER — Ambulatory Visit: Payer: Medicare Other | Admitting: Pharmacist

## 2019-11-20 DIAGNOSIS — F03B18 Unspecified dementia, moderate, with other behavioral disturbance: Secondary | ICD-10-CM

## 2019-11-20 DIAGNOSIS — F0391 Unspecified dementia with behavioral disturbance: Secondary | ICD-10-CM

## 2019-11-20 DIAGNOSIS — I4891 Unspecified atrial fibrillation: Secondary | ICD-10-CM

## 2019-11-20 NOTE — Chronic Care Management (AMB) (Signed)
Chronic Care Management   Follow Up Note   11/20/2019 Name: Alexandra Martinez MRN: [id number] DOB: 1929-07-07  Referred by: Crecencio Mc, MD Reason for referral : Chronic Care Management (Medication Management)   Alexandra Martinez is a 86 y.o. year old female who is a primary care patient of Tullo, Aris Everts, MD. The CCM team was consulted for assistance with chronic disease management and care coordination needs.    Contacted patient's caregiver, Alexandra Martinez, for medication management f/u.   Review of patient status, including review of consultants reports, relevant laboratory and other test results, and collaboration with appropriate care team members and the patient's provider was performed as part of comprehensive patient evaluation and provision of chronic care management services.    SDOH (Social Determinants of Health) assessments performed: Yes See Care Plan activities for detailed interventions related to SDOH)  SDOH Interventions     Most Recent Value  SDOH Interventions  Financial Strain Interventions Other (Comment)  [medication assistance support]       Outpatient Encounter Medications as of 11/20/2019  Medication Sig  . acetaminophen (TYLENOL) 500 MG tablet Take 2 tablets (1,000 mg total) by mouth every 12 (twelve) hours.  . ALPRAZolam (XANAX) 0.25 MG tablet Take 1 tablet (0.25 mg total) by mouth at bedtime.  Marland Kitchen alum & mag hydroxide-simeth (MAALOX PLUS) 400-400-40 MG/5ML suspension Take 10 mLs by mouth every 8 (eight) hours as needed for indigestion.  . bifidobacterium infantis (ALIGN) capsule Take 1 capsule by mouth daily.  Marland Kitchen levothyroxine (SYNTHROID) 50 MCG tablet TAKE ONE TABLET EVERY DAY  . losartan (COZAAR) 100 MG tablet TAKE 1 TABLET BY MOUTH DAILY  . metoprolol tartrate (LOPRESSOR) 50 MG tablet Take 50 mg by mouth 2 (two) times daily.  . mirtazapine (REMERON) 15 MG tablet Take 1 tablet (15 mg total) by mouth at bedtime.  . ondansetron (ZOFRAN ODT) 4 MG disintegrating  tablet Take 1 tablet (4 mg total) by mouth every 8 (eight) hours as needed for nausea or vomiting.  . pantoprazole (PROTONIX) 40 MG tablet Take 1 tablet (40 mg total) by mouth in the morning and at bedtime.  Marland Kitchen PARoxetine (PAXIL) 10 MG tablet Take 1 tablet (10 mg total) by mouth daily.  . rivastigmine (EXELON) 3 MG capsule Take 1 capsule (3 mg total) by mouth 2 (two) times daily.  [name] 15 MG TABS tablet Take 15 mg by mouth daily.   No facility-administered encounter medications on file as of 11/20/2019.     Objective:   Goals Addressed              This Visit's Progress     Patient Stated   .  PharmD "She can't afford this medication" (pt-stated)        CARE PLAN ENTRY (see longitudinal plan of care for additional care plan information)  Current Barriers:  . Financial barriers: patient's niece, Alexandra Martinez, reports problems affording Xarelto and rivastigmine o Working on patient assistance application for Xarelto through [employer] assistance. Submitted by CPhT on 7/20/211 . Polypharmacy; complex patient with multiple comorbidities including Afib, HTN, dementia, CKD, GAD . Most recent eGFR: ~41 mL/min (Care Everywhere per nephrology) o Afib/HTN: Dr. [name]; Xarelto 15 mg daily, metoprolol tartrate 50 mg BID, losartan 100 mg daily o Dementia: rivastigmine 3 mg BID; tier exception request denied - Hx donepezil, significant abdominal pain w/ this medication o Depression/anxiety: paroxetine 10 mg daily, mirtazapine 15 mg daily, alprazolam 0.25 mg daily o GERD: pantoprazole 40 mg BID o Hypothyroidism:  levothyroxine 50 mcg daily  Pharmacist Clinical Goal(s):  Marland Kitchen Over the next 90 days, patient will work with PharmD and provider towards optimized medication management  Interventions: . Comprehensive medication review performed via fill history; medication list updated in electronic medical record . Inter-disciplinary care team collaboration (see longitudinal plan of care) . Contacted Alexandra Martinez.  She has not heard anything regarding [employer] application for Xarelto assistance. Collaborated w/ CPhT; she will outreach next week as scheduled to f/u.  Marland Kitchen Alexandra Martinez denies any other questions or concerns today.   Patient Self Care Activities:  . Patient will take medications as prescribed  Please see past updates related to this goal by clicking on the "Past Updates" button in the selected goal          Plan:  - Scheduled f/u call in ~ 4 weeks  Catie Darnelle Maffucci, PharmD, [place], [place] Pharmacist [place] [phone]

## 2019-11-20 NOTE — Patient Instructions (Signed)
Visit Information  Goals Addressed              This Visit's Progress     Patient Stated   .  PharmD "She can't afford this medication" (pt-stated)        CARE PLAN ENTRY (see longitudinal plan of care for additional care plan information)  Current Barriers:  . Financial barriers: patient's niece, [name], reports problems affording Xarelto and rivastigmine o Working on patient assistance application for Xarelto through [employer] assistance. Submitted by CPhT on 7/20/211 . Polypharmacy; complex patient with multiple comorbidities including Afib, HTN, dementia, CKD, GAD . Most recent eGFR: ~41 mL/min (Care Everywhere per nephrology) o Afib/HTN: Dr. [name]; Xarelto 15 mg daily, metoprolol tartrate 50 mg BID, losartan 100 mg daily o Dementia: rivastigmine 3 mg BID; tier exception request denied - Hx donepezil, significant abdominal pain w/ this medication o Depression/anxiety: paroxetine 10 mg daily, mirtazapine 15 mg daily, alprazolam 0.25 mg daily o GERD: pantoprazole 40 mg BID o Hypothyroidism: levothyroxine 50 mcg daily  Pharmacist Clinical Goal(s):  Marland Kitchen Over the next 90 days, patient will work with PharmD and provider towards optimized medication management  Interventions: . Comprehensive medication review performed via fill history; medication list updated in electronic medical record . Inter-disciplinary care team collaboration (see longitudinal plan of care) . Contacted [name]. She has not heard anything regarding [employer] application for Xarelto assistance. Collaborated w/ CPhT; she will outreach next week as scheduled to f/u.  Marland Kitchen [name] denies any other questions or concerns today.   Patient Self Care Activities:  . Patient will take medications as prescribed  Please see past updates related to this goal by clicking on the "Past Updates" button in the selected goal         The patient verbalized understanding of instructions provided today and declined a print copy of patient  instruction materials.   Plan:  - Scheduled f/u call in ~ 4 weeks  Catie Darnelle Maffucci, PharmD, [place], [place] Pharmacist [place] [phone]

## 2019-11-23 ENCOUNTER — Other Ambulatory Visit: Payer: Self-pay | Admitting: Pharmacy Technician

## 2019-11-23 NOTE — Patient Outreach (Signed)
[place] [facility]) Care Management  11/23/2019  SHELLEE STRENG 29-Jul-1929 [id number]  Care coordination call placed to [employer] patient assistance program in regards to patient's application for Xarelto.  Spoke to [place] who informed the application was still processing. She informed to check back around mid week next week.  Will follow up with [employer] in 2-5 business days to inquire about a determination.  Khairi Garman P. Zarah [place], [place]  [phone]

## 2019-12-03 ENCOUNTER — Other Ambulatory Visit: Payer: Self-pay | Admitting: Pharmacy Technician

## 2019-12-03 NOTE — Patient Outreach (Signed)
[place] [facility]) Care Management  12/03/2019  Alexandra Martinez [birth year] [id number]   Care coordination call placed to [employer] in regards to patient's application for Xarelto.  Spoke to Tammy who informed the application was still processing but that she would work on the application and have a determination within the next 24-48 business hours.  Will follow up with [employer] in 3-5 business days to inquire if a determination has been made.  Sheronda Parran P. Basir Niven, [place]  [phone]

## 2019-12-06 ENCOUNTER — Other Ambulatory Visit: Payer: Self-pay | Admitting: Pharmacy Technician

## 2019-12-06 NOTE — Patient Outreach (Signed)
[place] [facility]) Care Management  12/06/2019  Alexandra Martinez Jul 26, 1929 [id number]  Care coordination call placed to [employer] in regards to patient's Xarelto application.   Spoke to [place] who informs the application is still processing. Informed Alexandra Martinez that the representative I spoke to on 8/6 said the application would be completed processing by 11/28/19. However, when I called on 8/16, I spoke to a representative that said the application was still being processed and would be completed by 12/05/19. Alexandra Martinez apologized and said she was in the que to be processed and she informs she hopes it would be completed by Tuesday next week 12/11/2019.  Will follow up with [employer] in 3-5 business days to inquire if a determination has been made.  Alexandra Martinez P. Adisa Litt, [place]  [phone]

## 2019-12-12 ENCOUNTER — Other Ambulatory Visit: Payer: Self-pay | Admitting: Pharmacy Technician

## 2019-12-12 NOTE — Patient Outreach (Signed)
[place] [facility]) Care Management  12/12/2019  Alexandra Martinez 1929/08/24 [id number]  Care coordination call placed to [employer] in regards to patient's application for Xarelto.  Spoke to [employer] who informed patient was denied due to not meeting the 4% OOP. After inquiring how much they show patient has spent, it was determined that [employer] had not uploaded her OOP spend documents to her account. Stormi informed she would do that now and send back to the fastest processing que and informed to check back in 24-48 business hours.  Will follow up with [employer] in 24-48 business hours to inquire about a determination.  Nachman Sundt P. Vahan [place], [place]  [phone]

## 2019-12-14 ENCOUNTER — Other Ambulatory Visit: Payer: Self-pay | Admitting: Pharmacy Technician

## 2019-12-14 NOTE — Patient Outreach (Signed)
[place] [facility]) Care Management  12/14/2019  Alexandra Martinez June 23, 1929 [id number]  Care coordination call placed to [employer] in regards to Xarelto application.  Spoke to [place] who informed they are still processing the patient's OOP to determine if she has met the 4%. [name] informed to check back either at the end of day today or next week.  Will follow up with [employer] in 24-72 business hours.  [name] P. Ragena Fiola, [place]  [phone]

## 2019-12-17 ENCOUNTER — Other Ambulatory Visit: Payer: Self-pay | Admitting: Pharmacy Technician

## 2019-12-17 NOTE — Patient Outreach (Signed)
[place] [facility]) Care Management  12/17/2019  Alexandra Martinez 12/29/1929 [id number]   Care coordination call placed to [employer] in regards to Xarelto application.  Spoke to [place] who informed patient was denied due to not submitting OOP.  Informed Alexandra Martinez that is what the last 2 representatives said as well. They also informed they found the OOP and was sending it to processing with 48 hour turnaround time. Alexandra Martinez placed me on hold. When Alexandra Martinez came back to the phone, she apologized and said she found the information and that she reached out to the processing team. She informed they would have the application processed today.  Will follow up with [employer] in 1-2 business days to inquire if a determination was made.  Alexandra Martinez, [place]  [phone]

## 2019-12-18 ENCOUNTER — Other Ambulatory Visit: Payer: Self-pay | Admitting: Pharmacy Technician

## 2019-12-18 NOTE — Patient Outreach (Signed)
[facility]) Care Management  12/18/2019  Alexandra Martinez 06-09-1929 [id number]  Care coordination call placed to [employer] in regards to Xarelto application.  Spoke to [place] who informed patient's application was still not processed. She was able to locate the OOP. Per [name], the amounts seem to qualify to meet the required 4% OOP. She informed she would send this over to the processing team leadership  to inquire why this OOP was not being accepted and to find out why the delay. She informed she would call me back by end of business today to give an update on the status of the application. Provided [name] my number.  Will outreach J&J in 24-48 business hours if call is not returned today.  [name] P. Muriel Hannold, [place]  [phone]

## 2019-12-18 NOTE — Patient Outreach (Signed)
[facility]) Care Management  12/18/2019  Alexandra Martinez 29-Jan-1930 [id number]   Incoming call from [place] at [employer] in regards to patient's application for Xarelto.  [name] informed the processing leadership team would not accept the statement from the pharmacy because it does not showed she paid the amount that she is trying to use to meet the OOP requirement.  They informed an EOB would be best or a printout from the pharmacy showing amounts paid if patient uses one pharmacy. The printout date range would be 04/20/2019-present day.  Will outreach embedded [place] for assistance.  Ferlando Lia P. Carrisa Keller, [place]  [phone]

## 2019-12-19 ENCOUNTER — Other Ambulatory Visit: Payer: Self-pay | Admitting: Pharmacy Technician

## 2019-12-19 NOTE — Patient Outreach (Signed)
[facility]) Care Management  12/19/2019  Alexandra Martinez [birth year] [id number]  ADDENDUM  Incoming call received from patient's niece, [name], in regards to voicemail that was left in regards to J&J application for Xarelto.  Spoke to [place], HIPAA identifiers verified.  Informed [name] that [employer] was not going to accept the statement from [facility] as proof of OOP. Informed her that they would accept a more recent EOB from the patient's insurance or a pharmacy printout from 04/20/2019-current date. [name] informed she would try and find that information and/or contact the pharmacy. Provided [name] the address to which she could mail back the information.  Will route note to embedded [facility] RPh Catie Darnelle Maffucci if information is not received back within 15 business days.  Vannak Montenegro P. Stanislawa Gaffin, [place]  [phone]

## 2019-12-19 NOTE — Patient Outreach (Signed)
[facility]) Care Management  12/19/2019  Alexandra Martinez 1929/09/05 [id number]   Unsuccessful outreach call placed to patient in regards to J&J application for Xarelto.  HIPAA compliant voicemail left.  Was calling patient to update her on the application status and to discuss the need for additional OOP documentation to meet J&J requirement of 4%.  Will attempt another outreach in 5-10 business days if call is not returned.  Karesa Maultsby P. Avenly Roberge, [place]  [phone]

## 2019-12-27 ENCOUNTER — Other Ambulatory Visit: Payer: Self-pay | Admitting: Pharmacy Technician

## 2019-12-27 NOTE — Patient Outreach (Signed)
[facility]) Care Management  12/27/2019  Alexandra Martinez Jan 21, 1930 [id number]  Successful outreach call placed to patient in regards to voicemail left by [name], patient's niece and emergency contact.  Spoke to [name], HIPAA identifiers verified.  [name] informed she had the OOP information and was calling to confirm the address to mail the information. Provided [name] the address. [name] informed she would place in mail today.  Will route note to embedded [facility] RPh Catie Darnelle Maffucci if information is not received within the next 15 business days.  [name] P. Makailey Hodgkin, [place]  [phone]

## 2020-01-08 ENCOUNTER — Other Ambulatory Visit: Payer: Self-pay | Admitting: Pharmacy Technician

## 2020-01-08 NOTE — Patient Outreach (Signed)
[facility]) Care Management  01/08/2020  Alexandra Martinez 09-06-1929 [id number]   Successful outreach call placed to patient's niece in regards to out of pocket report needed for [employer] patient assistance application for Xarelto.  Spoke to [name], HIPAA identifiers verified.  Informed [name] that I had not received the OOP that she mailed back a week and a half ago around the 9th of September. Patient was surprised that I had not received it. Informed patient that the mail was still delayed in some aspects. [name] informed that she would like for me to let her know by the end of the week if I have received it and if I have not received it, then she believes she had copies and would drop them by the office to the attention of embedded RPh Catie [place].  Will outreach patient in 2-3 business days.  [name] P. [name], [place]  [phone]

## 2020-01-11 ENCOUNTER — Other Ambulatory Visit: Payer: Self-pay | Admitting: Pharmacy Technician

## 2020-01-11 NOTE — Patient Outreach (Signed)
[facility]) Care Management  01/11/2020  Alexandra Martinez [birth year] [id number]  Successful outreach call place to patient's niece, [name], in regards to the letter she mailed back to me which contains her OOP for J&J application for Xarelto.  HIPAA identifiers verified.  Informed patient I received the letter and would update her when a determination has been made by J&J  Will submit the OOP to J&J when back in the office next week and will follow up with [employer] in 3-5 business days after that date.  Hannan Hutmacher P. Devaun Hernandez, [place]  [phone]

## 2020-01-15 ENCOUNTER — Other Ambulatory Visit: Payer: Self-pay | Admitting: Pharmacy Technician

## 2020-01-15 NOTE — Patient Outreach (Signed)
[facility]) Care Management  01/15/2020  NIKESHA KWASNY 12-15-1929 [id number]  Received updated Out of [place]ocket information for patient's J&J application for Xarelto.  Submitted the information to J&J today.  Will follow up with J&J in 7-10 business days to inquire if a determination has been made.  Declan Adamson P. Naithan Delage, [place]  [phone]

## 2020-01-20 DIAGNOSIS — Z23 Encounter for immunization: Secondary | ICD-10-CM | POA: Diagnosis not present

## 2020-01-23 ENCOUNTER — Other Ambulatory Visit: Payer: Self-pay | Admitting: Pharmacy Technician

## 2020-01-23 NOTE — Patient Outreach (Signed)
[facility]) Care Management  01/23/2020  AIRI COPADO [birth year] [id number]  Care coordination call placed to [employer] in regards to patient's Xarelto application.  Spoke to [place] who informed the information had been received but had not been pulled out of the que and associated to the patient's account. Rosalie Doctor informed she would ask the processing team to pull from the que and to process the application. She informed that could take approximately 3-5 days.  Will follow up with J&J in 3-7 business days to inquire if a determination has been made.  Carsynn Bethune P. Marlisa Caridi, [place]  [phone]

## 2020-01-30 ENCOUNTER — Other Ambulatory Visit: Payer: Self-pay | Admitting: Pharmacy Technician

## 2020-01-30 NOTE — Patient Outreach (Signed)
[facility]) Care Management  01/30/2020  [name] Apr 19, 1930 [id number]  Care coordination call placed to [employer] in regards to patient's Xarelto application.  Spoke to [place] who informed they did received the OOP but the application has not been processed fully yet. Discussed the OOP amounts with [place]. She spoke to the processing team and informed that a determination would be made by end of business today and that the provider would receive a fax and then they would mail the determination letter to the patient.  Will follow up with J&J in 2-3 business days to inquire about the determination outcome.  Shailen Thielen P. Markevius Trombetta, [place]  [phone]

## 2020-01-31 ENCOUNTER — Other Ambulatory Visit: Payer: Self-pay | Admitting: Pharmacy Technician

## 2020-01-31 ENCOUNTER — Ambulatory Visit: Payer: Medicare Other | Admitting: Pharmacist

## 2020-01-31 DIAGNOSIS — F0391 Unspecified dementia with behavioral disturbance: Secondary | ICD-10-CM

## 2020-01-31 DIAGNOSIS — F03B18 Unspecified dementia, moderate, with other behavioral disturbance: Secondary | ICD-10-CM

## 2020-01-31 DIAGNOSIS — I4891 Unspecified atrial fibrillation: Secondary | ICD-10-CM

## 2020-01-31 NOTE — Chronic Care Management (AMB) (Signed)
Chronic Care Management   Follow Up Note   01/31/2020 Name: Alexandra Martinez MRN: [id number] DOB: 08/01/1929  Referred by: Alexandra Mc, MD Reason for referral : Chronic Care Management (Medication Management)   Alexandra Martinez is a 86 y.o. year old female who is a primary care patient of Alexandra Martinez, Alexandra Everts, MD. The CCM team was consulted for assistance with chronic disease management and care coordination needs.    Contacted patient's daughter for medication management review.   Review of patient status, including review of consultants reports, relevant laboratory and other test results, and collaboration with appropriate care team members and the patient's provider was performed as part of comprehensive patient evaluation and provision of chronic care management services.    SDOH (Social Determinants of Health) assessments performed: Yes See Care Plan activities for detailed interventions related to SDOH)  SDOH Interventions     Most Recent Value  SDOH Interventions  Financial Strain Interventions Other (Comment)  [manufacturer assistance]       Outpatient Encounter Medications as of 01/31/2020  Medication Sig  . acetaminophen (TYLENOL) 500 MG tablet Take 2 tablets (1,000 mg total) by mouth every 12 (twelve) hours.  . ALPRAZolam (XANAX) 0.25 MG tablet Take 1 tablet (0.25 mg total) by mouth at bedtime.  Marland Kitchen alum & mag hydroxide-simeth (MAALOX PLUS) 400-400-40 MG/5ML suspension Take 10 mLs by mouth every 8 (eight) hours as needed for indigestion.  . bifidobacterium infantis (ALIGN) capsule Take 1 capsule by mouth daily.  Marland Kitchen levothyroxine (SYNTHROID) 50 MCG tablet TAKE ONE TABLET EVERY DAY  . losartan (COZAAR) 100 MG tablet TAKE 1 TABLET BY MOUTH DAILY  . metoprolol tartrate (LOPRESSOR) 50 MG tablet Take 50 mg by mouth 2 (two) times daily.  . mirtazapine (REMERON) 15 MG tablet Take 1 tablet (15 mg total) by mouth at bedtime.  . ondansetron (ZOFRAN ODT) 4 MG disintegrating tablet Take 1  tablet (4 mg total) by mouth every 8 (eight) hours as needed for nausea or vomiting.  . pantoprazole (PROTONIX) 40 MG tablet Take 1 tablet (40 mg total) by mouth in the morning and at bedtime.  Marland Kitchen PARoxetine (PAXIL) 10 MG tablet Take 1 tablet (10 mg total) by mouth daily.  . rivastigmine (EXELON) 3 MG capsule Take 1 capsule (3 mg total) by mouth 2 (two) times daily.  Alveda Martinez 15 MG TABS tablet Take 15 mg by mouth daily.   No facility-administered encounter medications on file as of 01/31/2020.     Objective:   Goals Addressed              This Visit's Progress     Patient Stated   .  PharmD "She can't afford this medication" (pt-stated)        CARE PLAN ENTRY (see longitudinal plan of care for additional care plan information)  Current Barriers:  . Financial, social, or community barriers:  o Patient APPROVED for Xarelto assistance through [employer] o Receives medications in adherence packages at her assisted living facility  . Polypharmacy; complex patient with multiple comorbidities including Afib, HTN, dementia, CKD, GAD . Most recent eGFR: ~41 mL/min (Care Everywhere per nephrology) o Afib/HTN: Dr. Nehemiah Martinez; Xarelto 15 mg daily, metoprolol tartrate 50 mg BID, losartan 100 mg daily o Dementia: rivastigmine 3 mg BID; tier exception request denied - Hx donepezil, significant abdominal pain w/ this medication o Depression/anxiety: paroxetine 10 mg daily, mirtazapine 15 mg daily, alprazolam 0.25 mg daily o GERD: pantoprazole 40 mg BID o Hypothyroidism: levothyroxine 50 mcg  daily  Pharmacist Clinical Goal(s):  Marland Kitchen Over the next 90 days, patient will work with PharmD and provider towards optimized medication management  Interventions: . Comprehensive medication review performed via fill history; medication list updated in electronic medical record . Inter-disciplinary care team collaboration (see longitudinal plan of care) . Reviewed Xarelto patient assistance approval through [employer]  with patient's niece, Alexandra Martinez. Discussed that patient would need to meet the out of pocket spend for 2022 prior to re-eligibility next year.  Hulen Skains Xarelto assistance billing information into [employer].   Patient Self Care Activities:  . Patient will take medications as prescribed  Please see past updates related to this goal by clicking on the "Past Updates" button in the selected goal          Plan:  - Scheduled f/u call in ~ 12 weeks  Alexandra Martinez Alexandra Martinez, PharmD, [place], [place] Pharmacist [place] [phone]

## 2020-01-31 NOTE — Patient Instructions (Addendum)
Visit Information  Goals Addressed              This Visit's Progress     Patient Stated   .  PharmD "She can't afford this medication" (pt-stated)        CARE PLAN ENTRY (see longitudinal plan of care for additional care plan information)  Current Barriers:  . Financial, social, or community barriers:  o Patient APPROVED for Xarelto assistance through Alexandra Martinez o Receives medications in adherence packages at her assisted living facility  . Polypharmacy; complex patient with multiple comorbidities including Afib, HTN, dementia, CKD, GAD . Most recent eGFR: ~41 mL/min (Care Everywhere per nephrology) o Afib/HTN: Dr. [name]; Xarelto 15 mg daily, metoprolol tartrate 50 mg BID, losartan 100 mg daily o Dementia: rivastigmine 3 mg BID; tier exception request denied - Hx donepezil, significant abdominal pain w/ this medication o Depression/anxiety: paroxetine 10 mg daily, mirtazapine 15 mg daily, alprazolam 0.25 mg daily o GERD: pantoprazole 40 mg BID o Hypothyroidism: levothyroxine 50 mcg daily  Pharmacist Clinical Goal(s):  Marland Kitchen Over the next 90 days, patient will work with PharmD and provider towards optimized medication management  Interventions: . Comprehensive medication review performed via fill history; medication list updated in electronic medical record . Inter-disciplinary care team collaboration (see longitudinal plan of care) . Reviewed Xarelto patient assistance approval through Alexandra Martinez with patient's niece, Alexandra Martinez. Discussed that patient would need to meet the out of pocket spend for 2022 prior to re-eligibility next year.  Hulen Skains Xarelto assistance billing information into [employer].   Patient Self Care Activities:  . Patient will take medications as prescribed  Please see past updates related to this goal by clicking on the "Past Updates" button in the selected goal         The patient verbalized understanding of instructions provided today and declined  a print copy of patient instruction materials.      Plan:  - Scheduled f/u call in ~ 12 weeks  Catie Darnelle Maffucci, PharmD, [place], [place] Pharmacist [place] [phone]

## 2020-01-31 NOTE — Patient Outreach (Signed)
[facility]) Care Management  01/31/2020  Alexandra Martinez October 25, 1929 [id number]  Care coordination call placed to [employer] in regards to Xarelto application.  Spoke to [place] who informed patient was APPROVED 01/30/2020-04/18/2020.  He informed the billing copay card was mailed to the patient. The billing information is as followsKara Dies [id number] Group [id number] ID [id number]  [facility], [facility], [facility] embedded RPh Catie Darnelle Maffucci to update patient on how to use the card at the pharmacy and answer any questions as patient has CCM appointment with PharmD today.  Will close case as patient assistance has been completed.  [name] P. [name], [place]  [phone]

## 2020-02-06 ENCOUNTER — Telehealth: Payer: Self-pay | Admitting: Internal Medicine

## 2020-02-06 NOTE — Telephone Encounter (Signed)
Patient's sister called in to check on medication for her sister

## 2020-02-07 NOTE — Progress Notes (Signed)
Spoke with [name]. Reviewed that billing information for Xarelto assistance was already provided to the dispensing pharmacy.

## 2020-02-18 ENCOUNTER — Other Ambulatory Visit: Payer: Self-pay

## 2020-02-18 ENCOUNTER — Encounter: Payer: Self-pay | Admitting: Neurology

## 2020-02-18 ENCOUNTER — Ambulatory Visit (INDEPENDENT_AMBULATORY_CARE_PROVIDER_SITE_OTHER): Payer: Medicare Other | Admitting: Neurology

## 2020-02-18 VITALS — BP 102/66 | HR 77 | Ht 66.0 in | Wt 133.6 lb

## 2020-02-18 DIAGNOSIS — F0391 Unspecified dementia with behavioral disturbance: Secondary | ICD-10-CM

## 2020-02-18 DIAGNOSIS — F03B18 Unspecified dementia, moderate, with other behavioral disturbance: Secondary | ICD-10-CM

## 2020-02-18 NOTE — Patient Instructions (Signed)
Good to see you!  Please check on the cost of Rivastigmine. If cost-prohibitive, we can switch to Memantine and see if this is a better cost option. Continue 24/7 care. Follow-up in 1 year, call for any changes

## 2020-02-18 NOTE — Progress Notes (Signed)
NEUROLOGY FOLLOW UP OFFICE NOTE  Alexandra Martinez [id number] 1929-11-05  HISTORY OF PRESENT ILLNESS: I had the pleasure of seeing Alexandra Martinez in follow-up in the neurology clinic on 02/18/2020.  The patient was last seen over a year ago for moderate dementia with behavioral disturbance. She is again accompanied by her niece Alexandra Martinez who helps supplement the history today.  Records and images were personally reviewed where available.  MMSE 9/30 in 11/2018. She lives in [place] where medications are administered. She is on Rivastigmine 3mg  BID. She is on Paxil 10mg  daily and Mirtazapine 15mg  qhs. She reports feeling tired and having a lot of troubles and feeling bad. She reports occasional pain, "I work in the [employer] Sleep is good. She denies any headaches, dizziness, vision changes, no falls. She feels her memory is okay. She repeats herself several times, ruminating on the food at the facility. Alexandra Martinez reports hallucinations are less, she is not talking about them as much. Alexandra Martinez feels she seems to be okay. She needs assistance with dressing and bathing, she wears adult diapers. She joins crafts and bowling at the facility.    History on Initial Assessment 09/07/2017: This is an 86 year old right-handed woman with a history of hypertension, hyperlipidemia, atrial fibrillation on anticoagulation with Xarelto, presenting for evaluation of memory loss. She does not feel her memory is much off, and states "I don't read as much as I used to." She lives alone. Her niece Alexandra Martinez accompanies her today and has difficulty providing additional history because "she will get ill at me." Alexandra Martinez started noticing changes around a year ago. Alexandra Martinez sees the patient on average 3-4 times a week. Alexandra Martinez has tried to help her with the medications but she insists on doing it herself. Alexandra Martinez tried to get her a pillbox but is not sure if she uses them. Alexandra Martinez then says she takes the medications pretty well. She denies missing bill payments,  but Alexandra Martinez reports she has forgotten a few bills 3-4 months ago and had to pay late fees for her water and cable bill. She denies getting lost driving, Alexandra Martinez is unsure, but does report that she visited family last Sunday who lives 3 miles from [place], but it took her 45 minutes to get to [employer] after. She told Alexandra Martinez the road was being torn up, but there is no construction going on. She gets very defensive and upset during the visit, and states she was doing fine until she had back surgery in 2015 and it still bothers her. Per PCP note, Alexandra Martinez gave a handwritten note about concerns, forgetting appointments, word-finding difficulties, and visual hallucinations. She has told family that she has woke up several times and found strange people in her home. Family attempts to discuss her condition have been repeatedly rebuffed. Her PCP tried to get home health but she told [facility] they were not needed when they called.   She has back pain and occasional neck pain, occasional constipation and urinary incontinence. No headaches, dizziness, diplopia, dysarthria/dysphagia, focal numbness/tingling/weakness, anosmia, or tremors. Her sister had Alzheimer's disease. No history of significant head injuries or alcohol use.  I personally reviewed MRI brain without contrast done 06/24/2017 which did not show any acute changes, there was moderate to advanced atrophy, more prominent at the medial temporal lobes.  PAST MEDICAL HISTORY: Past Medical History:  Diagnosis Date  . Anemia    r/t uterine fibroids  . Atrial fib/flutter, transient    [facility] admission for RVR 03/2009  .  DDD (degenerative disc disease)   . Dysrhythmia    A-FIB  . GERD (gastroesophageal reflux disease)   . H/O bladder infections   . H/O hiatal hernia   . H/O thyroid nodule    surgically removed  . Heart murmur    moderate MR/TR 08/2013 echo [name])  . Hemorrhoids   . Hiatal hernia with gastroesophageal reflux 2008   by EGD,  [name]  . High  cholesterol   . History of uterine fibroid   . Hydronephrosis    uretero- with congenital UPJ obstruction- Dr. Edrick Oh  . Hydronephrosis with ureteropelvic junction obstruction   . Hypertension    [facility]  . Hypothyroidism   . Nocturia   . Osteoporosis   . Polyuria   . Recurrent UTI     MEDICATIONS: Current Outpatient Medications on File Prior to Visit  Medication Sig Dispense Refill  . acetaminophen (TYLENOL) 500 MG tablet Take 2 tablets (1,000 mg total) by mouth every 12 (twelve) hours. 120 tablet 1  . ALPRAZolam (XANAX) 0.25 MG tablet Take 1 tablet (0.25 mg total) by mouth at bedtime. 30 tablet 5  . alum & mag hydroxide-simeth (MAALOX PLUS) 400-400-40 MG/5ML suspension Take 10 mLs by mouth every 8 (eight) hours as needed for indigestion. 355 mL 0  . Cholecalciferol 250 MCG (10000 UT) CAPS Take by mouth.    . levothyroxine (SYNTHROID) 50 MCG tablet TAKE ONE TABLET EVERY DAY 90 tablet 1  . losartan (COZAAR) 100 MG tablet TAKE 1 TABLET BY MOUTH DAILY 90 tablet 1  . metoprolol tartrate (LOPRESSOR) 50 MG tablet Take 50 mg by mouth 2 (two) times daily.    . mirtazapine (REMERON) 15 MG tablet Take 1 tablet (15 mg total) by mouth at bedtime. 90 tablet 0  . ondansetron (ZOFRAN ODT) 4 MG disintegrating tablet Take 1 tablet (4 mg total) by mouth every 8 (eight) hours as needed for nausea or vomiting. 20 tablet 0  . pantoprazole (PROTONIX) 40 MG tablet Take 1 tablet (40 mg total) by mouth in the morning and at bedtime. 180 tablet 3  . PARoxetine (PAXIL) 10 MG tablet Take 1 tablet (10 mg total) by mouth daily. 90 tablet 0  . rivastigmine (EXELON) 3 MG capsule Take 1 capsule (3 mg total) by mouth 2 (two) times daily. 180 capsule 3  . XARELTO 15 MG TABS tablet Take 15 mg by mouth daily.     No current facility-administered medications on file prior to visit.    ALLERGIES: Allergies  Allergen Reactions  . Celebrex [Celecoxib] Other (See Comments)  . Donepezil Other (See  Comments)    Stomach pains  . Loratadine-Pseudoephedrine Er     Other reaction(s): Unknown  . Venlafaxine Other (See Comments)  . Vioxx [Rofecoxib] Other (See Comments)  . Codeine Itching and Rash  . Dexilant [Dexlansoprazole] Rash  . Eliquis [Apixaban] Rash    FAMILY HISTORY: Family History  Problem Relation Age of Onset  . Heart disease Mother   . Coronary artery disease Father        MI's    SOCIAL HISTORY: Social History   Socioeconomic History  . Marital status: Widowed    Spouse name: Not on file  . Number of children: Not on file  . Years of education: Not on file  . Highest education level: Not on file  Occupational History  . Not on file  Tobacco Use  . Smoking status: Never Smoker  . Smokeless tobacco: Never Used  Vaping Use  .  Vaping Use: Never used  Substance and Sexual Activity  . Alcohol use: No  . Drug use: No  . Sexual activity: Never  Other Topics Concern  . Not on file  Social History Narrative   [facility] at [facility] widowed in 2005.   Both son's have passed away   June 30, 2022 grade education   Retired Engineer, manufacturing systems   Social Determinants of Radio broadcast assistant Strain: Medium Risk  . Difficulty of Paying Living Expenses: Somewhat hard  Food Insecurity:   . Worried About Charity fundraiser in the Last Year: Not on file  . Ran Out of Food in the Last Year: Not on file  Transportation Needs:   . Lack of Transportation (Medical): Not on file  . Lack of Transportation (Non-Medical): Not on file  Physical Activity:   . Days of Exercise per Week: Not on file  . Minutes of Exercise per Session: Not on file  Stress:   . Feeling of Stress : Not on file  Social Connections:   . Frequency of Communication with Friends and Family: Not on file  . Frequency of Social Gatherings with Friends and Family: Not on file  . Attends Religious Services: Not on file  . Active Member of Clubs or Organizations: Not on file  . Attends Theatre manager Meetings: Not on file  . Marital Status: Not on file  Intimate Partner Violence:   . Fear of Current or Ex-Partner: Not on file  . Emotionally Abused: Not on file  . Physically Abused: Not on file  . Sexually Abused: Not on file     PHYSICAL EXAM: Vitals:   02/18/20 1447  BP: 102/66  Pulse: 77  SpO2: 96%   General: No acute distress Head:  Normocephalic/atraumatic Skin/Extremities: No rash, no edema Neurological Exam: alert and oriented to person. Month is "the second." Place is "where we go to get something done for [place]." [place] is [employer]. She is having more word-finding difficulties, unable to name "pen" saying "you write with it." Able to name button. Fund of knowledge is reduced.  Recent and remote memory are impaired.  Attention and concentration are reduced.   Cranial nerves: Pupils equal, round. Extraocular movements intact with no nystagmus. Visual fields full.  No facial asymmetry.  Motor: Bulk and tone normal, muscle strength 5/5 throughout with no pronator drift.   Finger to nose testing intact.  Gait slow and cautious with walker, no ataxia   IMPRESSION: This is an 86 yo RH woman with a history of hypertension, hyperlipidemia, atrial fibrillation on anticoagulation with Xarelto, with moderate dementia with behavioral disturbance, likely Alzheimer's disease. MMSE 9/30 in 11/2018  (12/30 in December 2019, 15/30 in May 2019). MRI brain done 06/24/2017 did not show any acute changes, there was moderate to advanced atrophy, more prominent at the medial temporal lobes. She appears to be having less hallucinations. Her niece reports one of her medications is cost-prohibitive, she will check on Rivastigmine cost, we may switch to Memantine. Continue Paxil and mirtazapine, continue 24/7 care. Follow-up in 1 year, they know to call for any changes.    Thank you for allowing me to participate in her care.  Please do not hesitate to call for any questions or concerns.   Ellouise Newer, M.D.   CC: Dr. Derrel Nip

## 2020-02-20 ENCOUNTER — Telehealth: Payer: Self-pay | Admitting: Neurology

## 2020-02-20 NOTE — Telephone Encounter (Signed)
Patient's contact Alexandra Martinez called and said, "I've checked with the pharmacy and the memantine is cheaper than rivastigmine." Patient wants to switch.  [facility] in [place]

## 2020-02-21 MED ORDER — MEMANTINE HCL 10 MG PO TABS: ORAL_TABLET | ORAL | 3 refills | Status: DC

## 2020-02-21 NOTE — Telephone Encounter (Signed)
Pls let her know the main side effects to monitor on Memantine are dizziness, fatigue. Start 10mg  qhs x 2 weeks, then increase to 10mg  BID. Start weaning off the Rivastigmine, give 1 tab qhs for 1 week, then stop. I sent in Rx, thanks

## 2020-02-21 NOTE — Telephone Encounter (Signed)
Pt sister called and informed of medication change, order faxed to the medication room where pt lives at [phone]

## 2020-02-28 DIAGNOSIS — N2581 Secondary hyperparathyroidism of renal origin: Secondary | ICD-10-CM | POA: Diagnosis not present

## 2020-02-28 DIAGNOSIS — I10 Essential (primary) hypertension: Secondary | ICD-10-CM | POA: Diagnosis not present

## 2020-02-28 DIAGNOSIS — N1832 Chronic kidney disease, stage 3b: Secondary | ICD-10-CM | POA: Diagnosis not present

## 2020-02-28 DIAGNOSIS — R600 Localized edema: Secondary | ICD-10-CM | POA: Diagnosis not present

## 2020-02-28 DIAGNOSIS — R809 Proteinuria, unspecified: Secondary | ICD-10-CM | POA: Diagnosis not present

## 2020-02-28 DIAGNOSIS — D631 Anemia in chronic kidney disease: Secondary | ICD-10-CM | POA: Diagnosis not present

## 2020-03-05 DIAGNOSIS — I10 Essential (primary) hypertension: Secondary | ICD-10-CM | POA: Diagnosis not present

## 2020-03-05 DIAGNOSIS — N1832 Chronic kidney disease, stage 3b: Secondary | ICD-10-CM | POA: Diagnosis not present

## 2020-03-05 DIAGNOSIS — I482 Chronic atrial fibrillation, unspecified: Secondary | ICD-10-CM | POA: Diagnosis not present

## 2020-03-05 DIAGNOSIS — I700 Atherosclerosis of aorta: Secondary | ICD-10-CM | POA: Diagnosis not present

## 2020-03-05 DIAGNOSIS — R600 Localized edema: Secondary | ICD-10-CM | POA: Diagnosis not present

## 2020-03-27 ENCOUNTER — Telehealth: Payer: Self-pay | Admitting: Internal Medicine

## 2020-03-27 NOTE — Telephone Encounter (Signed)
Spoke with pt's caregiver and she has been scheduled for next week.

## 2020-03-27 NOTE — Telephone Encounter (Signed)
Patient's sister called and said that her sister lives at [facility]. Yesterday she was standing in front of her chair and just feel into chair. Patient's sister said the BP is running high, she said top number was 169, could not remember the bottom number. Alexandra Martinez feels that patient should be seen by her Primary Care Provider. At the time of call no appointments are available with any provider.

## 2020-03-27 NOTE — Telephone Encounter (Signed)
Is there somewhere that you would like for me to work pt in? No appts this week and only covid-19 appts for next week.

## 2020-03-27 NOTE — Telephone Encounter (Signed)
You can use a covid appt next week

## 2020-03-31 ENCOUNTER — Other Ambulatory Visit: Payer: Self-pay

## 2020-03-31 ENCOUNTER — Ambulatory Visit (INDEPENDENT_AMBULATORY_CARE_PROVIDER_SITE_OTHER): Payer: Medicare Other | Admitting: Internal Medicine

## 2020-03-31 ENCOUNTER — Ambulatory Visit: Payer: PRIVATE HEALTH INSURANCE | Admitting: Internal Medicine

## 2020-03-31 ENCOUNTER — Encounter: Payer: Self-pay | Admitting: Internal Medicine

## 2020-03-31 VITALS — BP 130/64 | HR 63 | Temp 97.8°F | Ht 65.98 in | Wt 135.4 lb

## 2020-03-31 DIAGNOSIS — R2681 Unsteadiness on feet: Secondary | ICD-10-CM

## 2020-03-31 DIAGNOSIS — E559 Vitamin D deficiency, unspecified: Secondary | ICD-10-CM | POA: Diagnosis not present

## 2020-03-31 DIAGNOSIS — Z7901 Long term (current) use of anticoagulants: Secondary | ICD-10-CM | POA: Diagnosis not present

## 2020-03-31 DIAGNOSIS — I10 Essential (primary) hypertension: Secondary | ICD-10-CM | POA: Diagnosis not present

## 2020-03-31 DIAGNOSIS — R42 Dizziness and giddiness: Secondary | ICD-10-CM

## 2020-03-31 DIAGNOSIS — R634 Abnormal weight loss: Secondary | ICD-10-CM

## 2020-03-31 DIAGNOSIS — Z9181 History of falling: Secondary | ICD-10-CM | POA: Diagnosis not present

## 2020-03-31 DIAGNOSIS — N184 Chronic kidney disease, stage 4 (severe): Secondary | ICD-10-CM | POA: Diagnosis not present

## 2020-03-31 DIAGNOSIS — E538 Deficiency of other specified B group vitamins: Secondary | ICD-10-CM | POA: Diagnosis not present

## 2020-03-31 DIAGNOSIS — I129 Hypertensive chronic kidney disease with stage 1 through stage 4 chronic kidney disease, or unspecified chronic kidney disease: Secondary | ICD-10-CM | POA: Diagnosis not present

## 2020-03-31 DIAGNOSIS — D6869 Other thrombophilia: Secondary | ICD-10-CM

## 2020-03-31 LAB — VITAMIN B12: Vitamin B-12: 191 pg/mL — ABNORMAL LOW (ref 211–911)

## 2020-03-31 LAB — VITAMIN D 25 HYDROXY (VIT D DEFICIENCY, FRACTURES): VITD: 38.34 ng/mL (ref 30.00–100.00)

## 2020-03-31 LAB — CBC WITH DIFFERENTIAL/PLATELET
Basophils Absolute: 0 10*3/uL (ref 0.0–0.1)
Basophils Relative: 0.7 % (ref 0.0–3.0)
Eosinophils Absolute: 0.2 10*3/uL (ref 0.0–0.7)
Eosinophils Relative: 3 % (ref 0.0–5.0)
HCT: 32.9 % — ABNORMAL LOW (ref 36.0–46.0)
Hemoglobin: 11.1 g/dL — ABNORMAL LOW (ref 12.0–15.0)
Lymphocytes Relative: 21.1 % (ref 12.0–46.0)
Lymphs Abs: 1.3 10*3/uL (ref 0.7–4.0)
MCHC: 33.9 g/dL (ref 30.0–36.0)
MCV: 92.9 fl (ref 78.0–100.0)
Monocytes Absolute: 0.5 10*3/uL (ref 0.1–1.0)
Monocytes Relative: 8.7 % (ref 3.0–12.0)
Neutro Abs: 4 10*3/uL (ref 1.4–7.7)
Neutrophils Relative %: 66.5 % (ref 43.0–77.0)
Platelets: 166 10*3/uL (ref 150.0–400.0)
RBC: 3.54 Mil/uL — ABNORMAL LOW (ref 3.87–5.11)
RDW: 13.5 % (ref 11.5–15.5)
WBC: 6 10*3/uL (ref 4.0–10.5)

## 2020-03-31 NOTE — Assessment & Plan Note (Signed)
She has had 2 falls in the last week. Referral for PT eval and treatment of poor balance in process
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
## 2020-03-31 NOTE — Assessment & Plan Note (Signed)
2 falls in the last week .  DC Xarelto . PT evaluation ordered

## 2020-03-31 NOTE — Assessment & Plan Note (Signed)
Well controlled on current regimen by our reading today.  Continue losartan and metoprolol. . Renal function declining , now with GFR 29 ml/min per nephrology, no changes today.

## 2020-03-31 NOTE — Assessment & Plan Note (Signed)
Secondary to dementia.  She has gained weight with increased supervision

## 2020-03-31 NOTE — Patient Instructions (Addendum)
IT'S TIME TO STOP THE [name],  BECAUSE  IF Alexandra Martinez  Hits  HER HEAD WHILE TAKING THIS BLOOD THINNER SHE WILL HAVE A BRAIN BLEED

## 2020-03-31 NOTE — Assessment & Plan Note (Addendum)
GFR is now stage 4 per nephrology ,  GFR 29 ml/min by November outside labs.  She has anemia and secondary hyperparathyroidism.  Checking Vit D level an d CBC today as these may be contributing to her falls

## 2020-03-31 NOTE — Assessment & Plan Note (Signed)
Checking level today given increased falls.

## 2020-04-01 DIAGNOSIS — F0280 Dementia in other diseases classified elsewhere without behavioral disturbance: Secondary | ICD-10-CM | POA: Diagnosis not present

## 2020-04-01 DIAGNOSIS — E039 Hypothyroidism, unspecified: Secondary | ICD-10-CM | POA: Diagnosis not present

## 2020-04-01 DIAGNOSIS — N2581 Secondary hyperparathyroidism of renal origin: Secondary | ICD-10-CM | POA: Diagnosis not present

## 2020-04-01 DIAGNOSIS — I482 Chronic atrial fibrillation, unspecified: Secondary | ICD-10-CM | POA: Diagnosis not present

## 2020-04-01 DIAGNOSIS — R634 Abnormal weight loss: Secondary | ICD-10-CM | POA: Diagnosis not present

## 2020-04-01 DIAGNOSIS — R42 Dizziness and giddiness: Secondary | ICD-10-CM | POA: Diagnosis not present

## 2020-04-01 DIAGNOSIS — E559 Vitamin D deficiency, unspecified: Secondary | ICD-10-CM | POA: Diagnosis not present

## 2020-04-01 DIAGNOSIS — I129 Hypertensive chronic kidney disease with stage 1 through stage 4 chronic kidney disease, or unspecified chronic kidney disease: Secondary | ICD-10-CM | POA: Diagnosis not present

## 2020-04-01 DIAGNOSIS — Z9181 History of falling: Secondary | ICD-10-CM | POA: Diagnosis not present

## 2020-04-01 DIAGNOSIS — D6869 Other thrombophilia: Secondary | ICD-10-CM | POA: Diagnosis not present

## 2020-04-01 DIAGNOSIS — D631 Anemia in chronic kidney disease: Secondary | ICD-10-CM | POA: Diagnosis not present

## 2020-04-01 DIAGNOSIS — N184 Chronic kidney disease, stage 4 (severe): Secondary | ICD-10-CM | POA: Diagnosis not present

## 2020-04-01 DIAGNOSIS — E538 Deficiency of other specified B group vitamins: Secondary | ICD-10-CM | POA: Diagnosis not present

## 2020-04-01 DIAGNOSIS — Z7901 Long term (current) use of anticoagulants: Secondary | ICD-10-CM | POA: Diagnosis not present

## 2020-04-01 NOTE — Progress Notes (Signed)
Her B12 is low again.  She needs to have 4 weekly injections .  She is a resident at a memory care facility, so a letter with the order has been drafted with the following information  1) please administer 1000 mcg B12 IM weekly x 4, then monthly thereafter

## 2020-04-02 ENCOUNTER — Telehealth: Payer: Self-pay | Admitting: Internal Medicine

## 2020-04-02 DIAGNOSIS — E538 Deficiency of other specified B group vitamins: Secondary | ICD-10-CM

## 2020-04-02 MED ORDER — "SYRINGE 23G X 1"" 3 ML MISC": 0 refills | Status: DC

## 2020-04-02 MED ORDER — VITAMIN B-12 1000 MCG PO TABS: 1000.0000 ug | ORAL_TABLET | Freq: Every day | ORAL | 3 refills | Status: DC

## 2020-04-02 MED ORDER — CYANOCOBALAMIN 1000 MCG/ML IJ SOLN: 1000.0000 ug | Freq: Once | INTRAMUSCULAR | 0 refills | Status: DC

## 2020-04-02 NOTE — Telephone Encounter (Addendum)
[facility] called in stated that [facility] sent in a prescription for B12 injection and they don't do injection  Need to write another prescription for tablets Contact number [phone],  Fax [phone]

## 2020-04-02 NOTE — Telephone Encounter (Signed)
I have been advised that the facility cannot administer IM injections.  An rx for B12 tablets has been  sent to long term care pharmacy  Directions  Please administer 2500 mcg B12 orally daily   Regards,   Deborra Medina, MD

## 2020-04-02 NOTE — Telephone Encounter (Signed)
[facility] called back wanted to know which dosage she wanted them to give her they have two prescriptions 1060mcg or the 2568mcg

## 2020-04-02 NOTE — Telephone Encounter (Signed)
Spoke with Alexandra Martinez at [facility] and advised her that Dr. Derrel Nip would like pt to take B12 2500 mcg oral daily since they are not able to do the injections.

## 2020-04-02 NOTE — Telephone Encounter (Signed)
Orders have been faxed to [facility] at [phone] and [phone] as both were listed as Fax numbers for [facility].

## 2020-04-03 DIAGNOSIS — R42 Dizziness and giddiness: Secondary | ICD-10-CM | POA: Diagnosis not present

## 2020-04-03 DIAGNOSIS — I129 Hypertensive chronic kidney disease with stage 1 through stage 4 chronic kidney disease, or unspecified chronic kidney disease: Secondary | ICD-10-CM | POA: Diagnosis not present

## 2020-04-03 DIAGNOSIS — D6869 Other thrombophilia: Secondary | ICD-10-CM | POA: Diagnosis not present

## 2020-04-03 DIAGNOSIS — R634 Abnormal weight loss: Secondary | ICD-10-CM | POA: Diagnosis not present

## 2020-04-03 DIAGNOSIS — F0280 Dementia in other diseases classified elsewhere without behavioral disturbance: Secondary | ICD-10-CM | POA: Diagnosis not present

## 2020-04-03 DIAGNOSIS — E538 Deficiency of other specified B group vitamins: Secondary | ICD-10-CM | POA: Diagnosis not present

## 2020-04-04 ENCOUNTER — Telehealth: Payer: Self-pay | Admitting: Internal Medicine
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
## 2020-04-08 DIAGNOSIS — F0280 Dementia in other diseases classified elsewhere without behavioral disturbance: Secondary | ICD-10-CM | POA: Diagnosis not present

## 2020-04-08 DIAGNOSIS — E538 Deficiency of other specified B group vitamins: Secondary | ICD-10-CM | POA: Diagnosis not present

## 2020-04-08 DIAGNOSIS — R634 Abnormal weight loss: Secondary | ICD-10-CM | POA: Diagnosis not present

## 2020-04-08 DIAGNOSIS — R42 Dizziness and giddiness: Secondary | ICD-10-CM | POA: Diagnosis not present

## 2020-04-08 DIAGNOSIS — D6869 Other thrombophilia: Secondary | ICD-10-CM | POA: Diagnosis not present

## 2020-04-08 DIAGNOSIS — I129 Hypertensive chronic kidney disease with stage 1 through stage 4 chronic kidney disease, or unspecified chronic kidney disease: Secondary | ICD-10-CM | POA: Diagnosis not present

## 2020-04-10 DIAGNOSIS — R42 Dizziness and giddiness: Secondary | ICD-10-CM | POA: Diagnosis not present

## 2020-04-10 DIAGNOSIS — F0280 Dementia in other diseases classified elsewhere without behavioral disturbance: Secondary | ICD-10-CM | POA: Diagnosis not present

## 2020-04-10 DIAGNOSIS — E538 Deficiency of other specified B group vitamins: Secondary | ICD-10-CM | POA: Diagnosis not present

## 2020-04-10 DIAGNOSIS — D6869 Other thrombophilia: Secondary | ICD-10-CM | POA: Diagnosis not present

## 2020-04-10 DIAGNOSIS — R634 Abnormal weight loss: Secondary | ICD-10-CM | POA: Diagnosis not present

## 2020-04-10 DIAGNOSIS — I129 Hypertensive chronic kidney disease with stage 1 through stage 4 chronic kidney disease, or unspecified chronic kidney disease: Secondary | ICD-10-CM | POA: Diagnosis not present

## 2020-04-15 DIAGNOSIS — I129 Hypertensive chronic kidney disease with stage 1 through stage 4 chronic kidney disease, or unspecified chronic kidney disease: Secondary | ICD-10-CM | POA: Diagnosis not present

## 2020-04-15 DIAGNOSIS — R634 Abnormal weight loss: Secondary | ICD-10-CM | POA: Diagnosis not present

## 2020-04-15 DIAGNOSIS — F0280 Dementia in other diseases classified elsewhere without behavioral disturbance: Secondary | ICD-10-CM | POA: Diagnosis not present

## 2020-04-15 DIAGNOSIS — R42 Dizziness and giddiness: Secondary | ICD-10-CM | POA: Diagnosis not present

## 2020-04-15 DIAGNOSIS — E538 Deficiency of other specified B group vitamins: Secondary | ICD-10-CM | POA: Diagnosis not present

## 2020-04-15 DIAGNOSIS — D6869 Other thrombophilia: Secondary | ICD-10-CM | POA: Diagnosis not present

## 2020-04-17 DIAGNOSIS — R42 Dizziness and giddiness: Secondary | ICD-10-CM | POA: Diagnosis not present

## 2020-04-17 DIAGNOSIS — R634 Abnormal weight loss: Secondary | ICD-10-CM | POA: Diagnosis not present

## 2020-04-17 DIAGNOSIS — F0280 Dementia in other diseases classified elsewhere without behavioral disturbance: Secondary | ICD-10-CM | POA: Diagnosis not present

## 2020-04-17 DIAGNOSIS — I129 Hypertensive chronic kidney disease with stage 1 through stage 4 chronic kidney disease, or unspecified chronic kidney disease: Secondary | ICD-10-CM | POA: Diagnosis not present

## 2020-04-17 DIAGNOSIS — E538 Deficiency of other specified B group vitamins: Secondary | ICD-10-CM | POA: Diagnosis not present

## 2020-04-17 DIAGNOSIS — D6869 Other thrombophilia: Secondary | ICD-10-CM | POA: Diagnosis not present

## 2020-04-21 ENCOUNTER — Ambulatory Visit: Payer: PRIVATE HEALTH INSURANCE | Admitting: Internal Medicine

## 2020-04-21 DIAGNOSIS — E538 Deficiency of other specified B group vitamins: Secondary | ICD-10-CM | POA: Diagnosis not present

## 2020-04-21 DIAGNOSIS — R42 Dizziness and giddiness: Secondary | ICD-10-CM | POA: Diagnosis not present

## 2020-04-21 DIAGNOSIS — N2581 Secondary hyperparathyroidism of renal origin: Secondary | ICD-10-CM | POA: Diagnosis not present

## 2020-04-21 DIAGNOSIS — N184 Chronic kidney disease, stage 4 (severe): Secondary | ICD-10-CM | POA: Diagnosis not present

## 2020-04-21 DIAGNOSIS — F0280 Dementia in other diseases classified elsewhere without behavioral disturbance: Secondary | ICD-10-CM | POA: Diagnosis not present

## 2020-04-21 DIAGNOSIS — D6869 Other thrombophilia: Secondary | ICD-10-CM | POA: Diagnosis not present

## 2020-04-21 DIAGNOSIS — R634 Abnormal weight loss: Secondary | ICD-10-CM | POA: Diagnosis not present

## 2020-04-21 DIAGNOSIS — D631 Anemia in chronic kidney disease: Secondary | ICD-10-CM | POA: Diagnosis not present

## 2020-04-21 DIAGNOSIS — Z7901 Long term (current) use of anticoagulants: Secondary | ICD-10-CM | POA: Diagnosis not present

## 2020-04-21 DIAGNOSIS — E559 Vitamin D deficiency, unspecified: Secondary | ICD-10-CM | POA: Diagnosis not present

## 2020-04-21 DIAGNOSIS — Z9181 History of falling: Secondary | ICD-10-CM | POA: Diagnosis not present

## 2020-04-21 DIAGNOSIS — I482 Chronic atrial fibrillation, unspecified: Secondary | ICD-10-CM | POA: Diagnosis not present

## 2020-04-21 DIAGNOSIS — I129 Hypertensive chronic kidney disease with stage 1 through stage 4 chronic kidney disease, or unspecified chronic kidney disease: Secondary | ICD-10-CM | POA: Diagnosis not present

## 2020-04-22 DIAGNOSIS — N2581 Secondary hyperparathyroidism of renal origin: Secondary | ICD-10-CM | POA: Diagnosis not present

## 2020-04-22 DIAGNOSIS — R42 Dizziness and giddiness: Secondary | ICD-10-CM | POA: Diagnosis not present

## 2020-04-22 DIAGNOSIS — I129 Hypertensive chronic kidney disease with stage 1 through stage 4 chronic kidney disease, or unspecified chronic kidney disease: Secondary | ICD-10-CM | POA: Diagnosis not present

## 2020-04-22 DIAGNOSIS — D6869 Other thrombophilia: Secondary | ICD-10-CM | POA: Diagnosis not present

## 2020-04-22 DIAGNOSIS — F0280 Dementia in other diseases classified elsewhere without behavioral disturbance: Secondary | ICD-10-CM | POA: Diagnosis not present

## 2020-04-22 DIAGNOSIS — N184 Chronic kidney disease, stage 4 (severe): Secondary | ICD-10-CM | POA: Diagnosis not present

## 2020-04-22 DIAGNOSIS — E538 Deficiency of other specified B group vitamins: Secondary | ICD-10-CM | POA: Diagnosis not present

## 2020-04-22 DIAGNOSIS — Z7901 Long term (current) use of anticoagulants: Secondary | ICD-10-CM | POA: Diagnosis not present

## 2020-04-22 DIAGNOSIS — D631 Anemia in chronic kidney disease: Secondary | ICD-10-CM | POA: Diagnosis not present

## 2020-04-22 DIAGNOSIS — I482 Chronic atrial fibrillation, unspecified: Secondary | ICD-10-CM | POA: Diagnosis not present

## 2020-04-22 DIAGNOSIS — R634 Abnormal weight loss: Secondary | ICD-10-CM | POA: Diagnosis not present

## 2020-04-22 DIAGNOSIS — E559 Vitamin D deficiency, unspecified: Secondary | ICD-10-CM | POA: Diagnosis not present

## 2020-04-22 DIAGNOSIS — Z9181 History of falling: Secondary | ICD-10-CM | POA: Diagnosis not present

## 2020-04-29 DIAGNOSIS — Z7901 Long term (current) use of anticoagulants: Secondary | ICD-10-CM | POA: Diagnosis not present

## 2020-04-29 DIAGNOSIS — D631 Anemia in chronic kidney disease: Secondary | ICD-10-CM | POA: Diagnosis not present

## 2020-04-29 DIAGNOSIS — N2581 Secondary hyperparathyroidism of renal origin: Secondary | ICD-10-CM | POA: Diagnosis not present

## 2020-04-29 DIAGNOSIS — I129 Hypertensive chronic kidney disease with stage 1 through stage 4 chronic kidney disease, or unspecified chronic kidney disease: Secondary | ICD-10-CM | POA: Diagnosis not present

## 2020-04-29 DIAGNOSIS — I482 Chronic atrial fibrillation, unspecified: Secondary | ICD-10-CM | POA: Diagnosis not present

## 2020-04-29 DIAGNOSIS — Z9181 History of falling: Secondary | ICD-10-CM | POA: Diagnosis not present

## 2020-04-29 DIAGNOSIS — N184 Chronic kidney disease, stage 4 (severe): Secondary | ICD-10-CM | POA: Diagnosis not present

## 2020-04-29 DIAGNOSIS — E559 Vitamin D deficiency, unspecified: Secondary | ICD-10-CM | POA: Diagnosis not present

## 2020-04-29 DIAGNOSIS — R42 Dizziness and giddiness: Secondary | ICD-10-CM | POA: Diagnosis not present

## 2020-04-29 DIAGNOSIS — E538 Deficiency of other specified B group vitamins: Secondary | ICD-10-CM | POA: Diagnosis not present

## 2020-04-29 DIAGNOSIS — F0280 Dementia in other diseases classified elsewhere without behavioral disturbance: Secondary | ICD-10-CM | POA: Diagnosis not present

## 2020-04-29 DIAGNOSIS — D6869 Other thrombophilia: Secondary | ICD-10-CM | POA: Diagnosis not present

## 2020-04-29 DIAGNOSIS — R634 Abnormal weight loss: Secondary | ICD-10-CM | POA: Diagnosis not present

## 2020-05-01 DIAGNOSIS — N2581 Secondary hyperparathyroidism of renal origin: Secondary | ICD-10-CM | POA: Diagnosis not present

## 2020-05-01 DIAGNOSIS — Z9181 History of falling: Secondary | ICD-10-CM | POA: Diagnosis not present

## 2020-05-01 DIAGNOSIS — R42 Dizziness and giddiness: Secondary | ICD-10-CM | POA: Diagnosis not present

## 2020-05-01 DIAGNOSIS — R634 Abnormal weight loss: Secondary | ICD-10-CM | POA: Diagnosis not present

## 2020-05-01 DIAGNOSIS — D6869 Other thrombophilia: Secondary | ICD-10-CM | POA: Diagnosis not present

## 2020-05-01 DIAGNOSIS — E538 Deficiency of other specified B group vitamins: Secondary | ICD-10-CM | POA: Diagnosis not present

## 2020-05-01 DIAGNOSIS — I129 Hypertensive chronic kidney disease with stage 1 through stage 4 chronic kidney disease, or unspecified chronic kidney disease: Secondary | ICD-10-CM | POA: Diagnosis not present

## 2020-05-01 DIAGNOSIS — E559 Vitamin D deficiency, unspecified: Secondary | ICD-10-CM | POA: Diagnosis not present

## 2020-05-01 DIAGNOSIS — I482 Chronic atrial fibrillation, unspecified: Secondary | ICD-10-CM | POA: Diagnosis not present

## 2020-05-01 DIAGNOSIS — Z7901 Long term (current) use of anticoagulants: Secondary | ICD-10-CM | POA: Diagnosis not present

## 2020-05-01 DIAGNOSIS — F0280 Dementia in other diseases classified elsewhere without behavioral disturbance: Secondary | ICD-10-CM | POA: Diagnosis not present

## 2020-05-01 DIAGNOSIS — D631 Anemia in chronic kidney disease: Secondary | ICD-10-CM | POA: Diagnosis not present

## 2020-05-01 DIAGNOSIS — N184 Chronic kidney disease, stage 4 (severe): Secondary | ICD-10-CM | POA: Diagnosis not present

## 2020-05-06 ENCOUNTER — Telehealth: Payer: Self-pay | Admitting: Internal Medicine

## 2020-05-06 DIAGNOSIS — N184 Chronic kidney disease, stage 4 (severe): Secondary | ICD-10-CM | POA: Diagnosis not present

## 2020-05-06 DIAGNOSIS — I482 Chronic atrial fibrillation, unspecified: Secondary | ICD-10-CM | POA: Diagnosis not present

## 2020-05-06 DIAGNOSIS — N2581 Secondary hyperparathyroidism of renal origin: Secondary | ICD-10-CM | POA: Diagnosis not present

## 2020-05-06 DIAGNOSIS — E559 Vitamin D deficiency, unspecified: Secondary | ICD-10-CM | POA: Diagnosis not present

## 2020-05-06 DIAGNOSIS — D631 Anemia in chronic kidney disease: Secondary | ICD-10-CM | POA: Diagnosis not present

## 2020-05-06 DIAGNOSIS — Z7901 Long term (current) use of anticoagulants: Secondary | ICD-10-CM | POA: Diagnosis not present

## 2020-05-06 DIAGNOSIS — R634 Abnormal weight loss: Secondary | ICD-10-CM | POA: Diagnosis not present

## 2020-05-06 DIAGNOSIS — I129 Hypertensive chronic kidney disease with stage 1 through stage 4 chronic kidney disease, or unspecified chronic kidney disease: Secondary | ICD-10-CM | POA: Diagnosis not present

## 2020-05-06 DIAGNOSIS — F0280 Dementia in other diseases classified elsewhere without behavioral disturbance: Secondary | ICD-10-CM | POA: Diagnosis not present

## 2020-05-06 DIAGNOSIS — R42 Dizziness and giddiness: Secondary | ICD-10-CM | POA: Diagnosis not present

## 2020-05-06 DIAGNOSIS — D6869 Other thrombophilia: Secondary | ICD-10-CM | POA: Diagnosis not present

## 2020-05-06 DIAGNOSIS — E538 Deficiency of other specified B group vitamins: Secondary | ICD-10-CM | POA: Diagnosis not present

## 2020-05-06 DIAGNOSIS — Z9181 History of falling: Secondary | ICD-10-CM | POA: Diagnosis not present

## 2020-05-06 NOTE — Telephone Encounter (Signed)
Sonia Baller physical therapist from [facility] called to give update on physical therapy for patient she was more confused than normal todat  B/p was low informed staff of her condition

## 2020-05-08 DIAGNOSIS — I482 Chronic atrial fibrillation, unspecified: Secondary | ICD-10-CM | POA: Diagnosis not present

## 2020-05-08 DIAGNOSIS — F0280 Dementia in other diseases classified elsewhere without behavioral disturbance: Secondary | ICD-10-CM | POA: Diagnosis not present

## 2020-05-08 DIAGNOSIS — Z9181 History of falling: Secondary | ICD-10-CM | POA: Diagnosis not present

## 2020-05-08 DIAGNOSIS — N184 Chronic kidney disease, stage 4 (severe): Secondary | ICD-10-CM | POA: Diagnosis not present

## 2020-05-08 DIAGNOSIS — N2581 Secondary hyperparathyroidism of renal origin: Secondary | ICD-10-CM | POA: Diagnosis not present

## 2020-05-08 DIAGNOSIS — I129 Hypertensive chronic kidney disease with stage 1 through stage 4 chronic kidney disease, or unspecified chronic kidney disease: Secondary | ICD-10-CM | POA: Diagnosis not present

## 2020-05-08 DIAGNOSIS — E538 Deficiency of other specified B group vitamins: Secondary | ICD-10-CM | POA: Diagnosis not present

## 2020-05-08 DIAGNOSIS — D631 Anemia in chronic kidney disease: Secondary | ICD-10-CM | POA: Diagnosis not present

## 2020-05-08 DIAGNOSIS — R42 Dizziness and giddiness: Secondary | ICD-10-CM | POA: Diagnosis not present

## 2020-05-08 DIAGNOSIS — Z7901 Long term (current) use of anticoagulants: Secondary | ICD-10-CM | POA: Diagnosis not present

## 2020-05-08 DIAGNOSIS — R634 Abnormal weight loss: Secondary | ICD-10-CM | POA: Diagnosis not present

## 2020-05-08 DIAGNOSIS — D6869 Other thrombophilia: Secondary | ICD-10-CM | POA: Diagnosis not present

## 2020-05-08 DIAGNOSIS — E559 Vitamin D deficiency, unspecified: Secondary | ICD-10-CM | POA: Diagnosis not present

## 2020-05-09 DIAGNOSIS — N390 Urinary tract infection, site not specified: Secondary | ICD-10-CM | POA: Diagnosis not present

## 2020-05-13 DIAGNOSIS — Z7901 Long term (current) use of anticoagulants: Secondary | ICD-10-CM | POA: Diagnosis not present

## 2020-05-13 DIAGNOSIS — I482 Chronic atrial fibrillation, unspecified: Secondary | ICD-10-CM | POA: Diagnosis not present

## 2020-05-13 DIAGNOSIS — D6869 Other thrombophilia: Secondary | ICD-10-CM | POA: Diagnosis not present

## 2020-05-13 DIAGNOSIS — D631 Anemia in chronic kidney disease: Secondary | ICD-10-CM | POA: Diagnosis not present

## 2020-05-13 DIAGNOSIS — E559 Vitamin D deficiency, unspecified: Secondary | ICD-10-CM | POA: Diagnosis not present

## 2020-05-13 DIAGNOSIS — Z9181 History of falling: Secondary | ICD-10-CM | POA: Diagnosis not present

## 2020-05-13 DIAGNOSIS — E538 Deficiency of other specified B group vitamins: Secondary | ICD-10-CM | POA: Diagnosis not present

## 2020-05-13 DIAGNOSIS — R634 Abnormal weight loss: Secondary | ICD-10-CM | POA: Diagnosis not present

## 2020-05-13 DIAGNOSIS — N2581 Secondary hyperparathyroidism of renal origin: Secondary | ICD-10-CM | POA: Diagnosis not present

## 2020-05-13 DIAGNOSIS — I129 Hypertensive chronic kidney disease with stage 1 through stage 4 chronic kidney disease, or unspecified chronic kidney disease: Secondary | ICD-10-CM | POA: Diagnosis not present

## 2020-05-13 DIAGNOSIS — F0280 Dementia in other diseases classified elsewhere without behavioral disturbance: Secondary | ICD-10-CM | POA: Diagnosis not present

## 2020-05-13 DIAGNOSIS — N184 Chronic kidney disease, stage 4 (severe): Secondary | ICD-10-CM | POA: Diagnosis not present

## 2020-05-13 DIAGNOSIS — R42 Dizziness and giddiness: Secondary | ICD-10-CM | POA: Diagnosis not present

## 2020-05-15 ENCOUNTER — Telehealth: Payer: Self-pay

## 2020-05-15 NOTE — Telephone Encounter (Signed)
Left message with memory care unit to give [place] a call back

## 2020-05-15 NOTE — Telephone Encounter (Signed)
Received a fax from [facility] with UA results. The only abnormal was a trace of Leukocytes.  Red Blood Cell: 1 White Blood Cell: 8 Bacteria: Trace Squamous Epithelial: 1 Mucous: rare Color: yellow Clarity: clear Ph: 6 Sp Gr: 1.016 Glucose: negative Bilirubin: negative Ketones: negative Protein: negative Urobilinogen: negative Nitrite: negative Blood: negative  Do not have a culture report and not sure if one was done.

## 2020-05-15 NOTE — Telephone Encounter (Signed)
I will not treat a random UA without culture and without communication about why the urine was sampled (what symptoms did patient exhibit?)  I also never received the message from you that is in the chart from Jan 18 . It looks like it was never routed

## 2020-05-20 DIAGNOSIS — Z7901 Long term (current) use of anticoagulants: Secondary | ICD-10-CM | POA: Diagnosis not present

## 2020-05-20 DIAGNOSIS — N2581 Secondary hyperparathyroidism of renal origin: Secondary | ICD-10-CM | POA: Diagnosis not present

## 2020-05-20 DIAGNOSIS — I129 Hypertensive chronic kidney disease with stage 1 through stage 4 chronic kidney disease, or unspecified chronic kidney disease: Secondary | ICD-10-CM | POA: Diagnosis not present

## 2020-05-20 DIAGNOSIS — F0280 Dementia in other diseases classified elsewhere without behavioral disturbance: Secondary | ICD-10-CM | POA: Diagnosis not present

## 2020-05-20 DIAGNOSIS — Z9181 History of falling: Secondary | ICD-10-CM | POA: Diagnosis not present

## 2020-05-20 DIAGNOSIS — I482 Chronic atrial fibrillation, unspecified: Secondary | ICD-10-CM | POA: Diagnosis not present

## 2020-05-20 DIAGNOSIS — E559 Vitamin D deficiency, unspecified: Secondary | ICD-10-CM | POA: Diagnosis not present

## 2020-05-20 DIAGNOSIS — D631 Anemia in chronic kidney disease: Secondary | ICD-10-CM | POA: Diagnosis not present

## 2020-05-20 DIAGNOSIS — N184 Chronic kidney disease, stage 4 (severe): Secondary | ICD-10-CM | POA: Diagnosis not present

## 2020-05-20 DIAGNOSIS — R42 Dizziness and giddiness: Secondary | ICD-10-CM | POA: Diagnosis not present

## 2020-05-20 DIAGNOSIS — D6869 Other thrombophilia: Secondary | ICD-10-CM | POA: Diagnosis not present

## 2020-05-20 DIAGNOSIS — E538 Deficiency of other specified B group vitamins: Secondary | ICD-10-CM | POA: Diagnosis not present

## 2020-05-20 DIAGNOSIS — R634 Abnormal weight loss: Secondary | ICD-10-CM | POA: Diagnosis not present

## 2020-05-28 ENCOUNTER — Telehealth: Payer: Self-pay | Admitting: Internal Medicine

## 2020-05-28 NOTE — Telephone Encounter (Signed)
Received fax from [facility] asking for orders, Returned fax with note from [facility] that we will not treat a UA without the urine culture as well.

## 2020-05-28 NOTE — Telephone Encounter (Signed)
Received fax from [facility] asking for orders, Returned fax with note from [facility] that we will not treat a UA without the urine culture as well

## 2020-05-28 NOTE — Telephone Encounter (Signed)
Alexandra Martinez from [facility] called, [phone], fax number [phone]. She is needing patient's labs signed. Forms were pulled from Dr. Lupita Dawn mail box and placed in Dr. Ulice Dash folder for quick sign in pod.

## 2020-06-09 ENCOUNTER — Other Ambulatory Visit: Payer: Self-pay

## 2020-06-09 MED ORDER — LOSARTAN POTASSIUM 100 MG PO TABS: 100.0000 mg | ORAL_TABLET | Freq: Every day | ORAL | 1 refills | Status: DC

## 2020-07-11 ENCOUNTER — Telehealth: Payer: Self-pay | Admitting: Internal Medicine

## 2020-07-11 NOTE — Telephone Encounter (Signed)
[facility] called pt fell this morning and has a skin tear and they need an order to dress the wound also they are needing a urine order because she fell and is confused  When pt fell she hit her elbow only  Fax# [phone] Or call [phone]

## 2020-07-11 NOTE — Telephone Encounter (Signed)
ORDER HAS BEEN FAXED TO [facility].

## 2020-07-11 NOTE — Telephone Encounter (Signed)
PLEASE GIVE FACILITY AN ORDER TO   1) DRESS SKIN TEAR   2) UA AND CULTURE   FOR   MS [facility]

## 2020-07-17 ENCOUNTER — Telehealth: Payer: Self-pay

## 2020-07-17 MED ORDER — ALPRAZOLAM 0.25 MG PO TABS: 0.2500 mg | ORAL_TABLET | Freq: Every evening | ORAL | 5 refills | Status: DC | PRN

## 2020-07-17 NOTE — Telephone Encounter (Signed)
Alexandra Martinez from [facility] called and is requesting order for alprazolam 0.25 one by mouth at bedtime. She also wanted to check on UA result. Please fax these to [phone]

## 2020-07-17 NOTE — Telephone Encounter (Signed)
Orders have been faxed

## 2020-07-17 NOTE — Telephone Encounter (Signed)
Letter and rx printed and signed.  In [employer]

## 2020-07-22 DIAGNOSIS — N184 Chronic kidney disease, stage 4 (severe): Secondary | ICD-10-CM | POA: Diagnosis not present

## 2020-07-22 DIAGNOSIS — D631 Anemia in chronic kidney disease: Secondary | ICD-10-CM | POA: Diagnosis not present

## 2020-07-22 DIAGNOSIS — N2581 Secondary hyperparathyroidism of renal origin: Secondary | ICD-10-CM | POA: Diagnosis not present

## 2020-07-22 DIAGNOSIS — I10 Essential (primary) hypertension: Secondary | ICD-10-CM | POA: Diagnosis not present

## 2020-09-02 DIAGNOSIS — I482 Chronic atrial fibrillation, unspecified: Secondary | ICD-10-CM | POA: Diagnosis not present

## 2020-09-02 DIAGNOSIS — E782 Mixed hyperlipidemia: Secondary | ICD-10-CM | POA: Diagnosis not present

## 2020-09-02 DIAGNOSIS — I700 Atherosclerosis of aorta: Secondary | ICD-10-CM | POA: Diagnosis not present

## 2020-09-02 DIAGNOSIS — I10 Essential (primary) hypertension: Secondary | ICD-10-CM | POA: Diagnosis not present

## 2020-09-24 ENCOUNTER — Inpatient Hospital Stay
Admission: EM | Admit: 2020-09-24 | Discharge: 2020-09-30 | DRG: 064 | Disposition: A | Discharge status: Skilled Nursing Facility | Payer: Medicare Other | Source: Skilled Nursing Facility | Attending: Internal Medicine | Admitting: Internal Medicine

## 2020-09-24 ENCOUNTER — Emergency Department: Payer: Medicare Other

## 2020-09-24 ENCOUNTER — Observation Stay: Payer: Medicare Other

## 2020-09-24 DIAGNOSIS — B9620 Unspecified Escherichia coli [E. coli] as the cause of diseases classified elsewhere: Secondary | ICD-10-CM | POA: Diagnosis present

## 2020-09-24 DIAGNOSIS — N3000 Acute cystitis without hematuria: Secondary | ICD-10-CM | POA: Diagnosis present

## 2020-09-24 DIAGNOSIS — I482 Chronic atrial fibrillation, unspecified: Secondary | ICD-10-CM

## 2020-09-24 DIAGNOSIS — I10 Essential (primary) hypertension: Secondary | ICD-10-CM | POA: Diagnosis present

## 2020-09-24 DIAGNOSIS — I639 Cerebral infarction, unspecified: Principal | ICD-10-CM | POA: Diagnosis present

## 2020-09-24 DIAGNOSIS — R296 Repeated falls: Secondary | ICD-10-CM | POA: Diagnosis present

## 2020-09-24 DIAGNOSIS — R519 Headache, unspecified: Secondary | ICD-10-CM | POA: Diagnosis not present

## 2020-09-24 DIAGNOSIS — N390 Urinary tract infection, site not specified: Secondary | ICD-10-CM

## 2020-09-24 DIAGNOSIS — R52 Pain, unspecified: Secondary | ICD-10-CM | POA: Diagnosis not present

## 2020-09-24 DIAGNOSIS — E785 Hyperlipidemia, unspecified: Secondary | ICD-10-CM | POA: Diagnosis present

## 2020-09-24 DIAGNOSIS — F0390 Unspecified dementia without behavioral disturbance: Secondary | ICD-10-CM | POA: Diagnosis not present

## 2020-09-24 DIAGNOSIS — G319 Degenerative disease of nervous system, unspecified: Secondary | ICD-10-CM | POA: Diagnosis not present

## 2020-09-24 DIAGNOSIS — I4891 Unspecified atrial fibrillation: Secondary | ICD-10-CM | POA: Diagnosis not present

## 2020-09-24 DIAGNOSIS — E039 Hypothyroidism, unspecified: Secondary | ICD-10-CM | POA: Diagnosis not present

## 2020-09-24 DIAGNOSIS — M47816 Spondylosis without myelopathy or radiculopathy, lumbar region: Secondary | ICD-10-CM | POA: Diagnosis not present

## 2020-09-24 DIAGNOSIS — Z043 Encounter for examination and observation following other accident: Secondary | ICD-10-CM | POA: Diagnosis not present

## 2020-09-24 DIAGNOSIS — Z888 Allergy status to other drugs, medicaments and biological substances status: Secondary | ICD-10-CM

## 2020-09-24 DIAGNOSIS — Z8673 Personal history of transient ischemic attack (TIA), and cerebral infarction without residual deficits: Secondary | ICD-10-CM | POA: Diagnosis not present

## 2020-09-24 DIAGNOSIS — I4892 Unspecified atrial flutter: Secondary | ICD-10-CM | POA: Diagnosis present

## 2020-09-24 DIAGNOSIS — R233 Spontaneous ecchymoses: Secondary | ICD-10-CM | POA: Diagnosis present

## 2020-09-24 DIAGNOSIS — I6359 Cerebral infarction due to unspecified occlusion or stenosis of other cerebral artery: Secondary | ICD-10-CM | POA: Diagnosis not present

## 2020-09-24 DIAGNOSIS — M160 Bilateral primary osteoarthritis of hip: Secondary | ICD-10-CM | POA: Diagnosis not present

## 2020-09-24 DIAGNOSIS — S0990XA Unspecified injury of head, initial encounter: Secondary | ICD-10-CM | POA: Diagnosis not present

## 2020-09-24 DIAGNOSIS — I4821 Permanent atrial fibrillation: Secondary | ICD-10-CM | POA: Diagnosis present

## 2020-09-24 DIAGNOSIS — Z885 Allergy status to narcotic agent status: Secondary | ICD-10-CM | POA: Diagnosis not present

## 2020-09-24 DIAGNOSIS — G9341 Metabolic encephalopathy: Secondary | ICD-10-CM | POA: Diagnosis present

## 2020-09-24 DIAGNOSIS — Z8249 Family history of ischemic heart disease and other diseases of the circulatory system: Secondary | ICD-10-CM | POA: Diagnosis not present

## 2020-09-24 DIAGNOSIS — M810 Age-related osteoporosis without current pathological fracture: Secondary | ICD-10-CM | POA: Diagnosis present

## 2020-09-24 DIAGNOSIS — Z79890 Hormone replacement therapy (postmenopausal): Secondary | ICD-10-CM | POA: Diagnosis not present

## 2020-09-24 DIAGNOSIS — Z9183 Wandering in diseases classified elsewhere: Secondary | ICD-10-CM

## 2020-09-24 DIAGNOSIS — R54 Age-related physical debility: Secondary | ICD-10-CM | POA: Diagnosis present

## 2020-09-24 DIAGNOSIS — D696 Thrombocytopenia, unspecified: Secondary | ICD-10-CM | POA: Diagnosis not present

## 2020-09-24 DIAGNOSIS — I614 Nontraumatic intracerebral hemorrhage in cerebellum: Secondary | ICD-10-CM | POA: Diagnosis not present

## 2020-09-24 DIAGNOSIS — W19XXXA Unspecified fall, initial encounter: Secondary | ICD-10-CM

## 2020-09-24 DIAGNOSIS — D509 Iron deficiency anemia, unspecified: Secondary | ICD-10-CM | POA: Diagnosis present

## 2020-09-24 DIAGNOSIS — Z20822 Contact with and (suspected) exposure to covid-19: Secondary | ICD-10-CM | POA: Diagnosis present

## 2020-09-24 DIAGNOSIS — Z66 Do not resuscitate: Secondary | ICD-10-CM | POA: Diagnosis not present

## 2020-09-24 DIAGNOSIS — K649 Unspecified hemorrhoids: Secondary | ICD-10-CM | POA: Diagnosis present

## 2020-09-24 DIAGNOSIS — D6859 Other primary thrombophilia: Secondary | ICD-10-CM | POA: Diagnosis not present

## 2020-09-24 DIAGNOSIS — Y92129 Unspecified place in nursing home as the place of occurrence of the external cause: Secondary | ICD-10-CM

## 2020-09-24 DIAGNOSIS — Z6825 Body mass index (BMI) 25.0-25.9, adult: Secondary | ICD-10-CM

## 2020-09-24 DIAGNOSIS — W010XXA Fall on same level from slipping, tripping and stumbling without subsequent striking against object, initial encounter: Secondary | ICD-10-CM | POA: Diagnosis present

## 2020-09-24 DIAGNOSIS — D519 Vitamin B12 deficiency anemia, unspecified: Secondary | ICD-10-CM

## 2020-09-24 DIAGNOSIS — Z515 Encounter for palliative care: Secondary | ICD-10-CM | POA: Diagnosis not present

## 2020-09-24 DIAGNOSIS — Z79899 Other long term (current) drug therapy: Secondary | ICD-10-CM

## 2020-09-24 DIAGNOSIS — Z886 Allergy status to analgesic agent status: Secondary | ICD-10-CM

## 2020-09-24 DIAGNOSIS — K219 Gastro-esophageal reflux disease without esophagitis: Secondary | ICD-10-CM | POA: Diagnosis present

## 2020-09-24 DIAGNOSIS — D649 Anemia, unspecified: Secondary | ICD-10-CM | POA: Diagnosis present

## 2020-09-24 DIAGNOSIS — J9811 Atelectasis: Secondary | ICD-10-CM | POA: Diagnosis not present

## 2020-09-24 DIAGNOSIS — D6869 Other thrombophilia: Secondary | ICD-10-CM | POA: Diagnosis not present

## 2020-09-24 DIAGNOSIS — I6523 Occlusion and stenosis of bilateral carotid arteries: Secondary | ICD-10-CM | POA: Diagnosis not present

## 2020-09-24 DIAGNOSIS — Z7189 Other specified counseling: Secondary | ICD-10-CM | POA: Diagnosis not present

## 2020-09-24 DIAGNOSIS — M1611 Unilateral primary osteoarthritis, right hip: Secondary | ICD-10-CM | POA: Diagnosis not present

## 2020-09-24 DIAGNOSIS — Z8679 Personal history of other diseases of the circulatory system: Secondary | ICD-10-CM

## 2020-09-24 LAB — URINALYSIS, COMPLETE (UACMP) WITH MICROSCOPIC
Bilirubin Urine: NEGATIVE
Glucose, UA: NEGATIVE mg/dL
Ketones, ur: NEGATIVE mg/dL
Nitrite: POSITIVE — AB
Protein, ur: NEGATIVE mg/dL
Specific Gravity, Urine: 1.008 (ref 1.005–1.030)
WBC, UA: >50 WBC/hpf — ABNORMAL HIGH (ref 0–5)
pH: 6 (ref 5.0–8.0)

## 2020-09-24 LAB — COMPREHENSIVE METABOLIC PANEL
ALT: 9 U/L (ref 0–44)
AST: 18 U/L (ref 15–41)
Albumin: 3.7 g/dL (ref 3.5–5.0)
Alkaline Phosphatase: 59 U/L (ref 38–126)
Anion gap: 10 (ref 5–15)
BUN: 18 mg/dL (ref 8–23)
CO2: 27 mmol/L (ref 22–32)
Calcium: 8.9 mg/dL (ref 8.9–10.3)
Chloride: 102 mmol/L (ref 98–111)
Creatinine, Ser: 1.19 mg/dL — ABNORMAL HIGH (ref 0.44–1.00)
GFR, Estimated: 44 mL/min — ABNORMAL LOW (ref >60)
Glucose, Bld: 97 mg/dL (ref 70–99)
Potassium: 3.8 mmol/L (ref 3.5–5.1)
Sodium: 139 mmol/L (ref 135–145)
Total Bilirubin: 0.7 mg/dL (ref 0.3–1.2)
Total Protein: 6.3 g/dL — ABNORMAL LOW (ref 6.5–8.1)

## 2020-09-24 LAB — CBC WITH DIFFERENTIAL/PLATELET
Abs Immature Granulocytes: 0.03 10*3/uL (ref 0.00–0.07)
Basophils Absolute: 0 10*3/uL (ref 0.0–0.1)
Basophils Relative: 1 %
Eosinophils Absolute: 0.1 10*3/uL (ref 0.0–0.5)
Eosinophils Relative: 2 %
HCT: 32.6 % — ABNORMAL LOW (ref 36.0–46.0)
Hemoglobin: 10.8 g/dL — ABNORMAL LOW (ref 12.0–15.0)
Immature Granulocytes: 1 %
Lymphocytes Relative: 21 %
Lymphs Abs: 1.3 10*3/uL (ref 0.7–4.0)
MCH: 30.9 pg (ref 26.0–34.0)
MCHC: 33.1 g/dL (ref 30.0–36.0)
MCV: 93.1 fL (ref 80.0–100.0)
Monocytes Absolute: 0.7 10*3/uL (ref 0.1–1.0)
Monocytes Relative: 10 %
Neutro Abs: 4.2 10*3/uL (ref 1.7–7.7)
Neutrophils Relative %: 65 %
Platelets: 143 10*3/uL — ABNORMAL LOW (ref 150–400)
RBC: 3.5 MIL/uL — ABNORMAL LOW (ref 3.87–5.11)
RDW: 12.4 % (ref 11.5–15.5)
WBC: 6.4 10*3/uL (ref 4.0–10.5)
nRBC: 0 % (ref 0.0–0.2)

## 2020-09-24 MED ORDER — ATORVASTATIN CALCIUM 20 MG PO TABS
40.0000 mg | ORAL_TABLET | Freq: Every day | ORAL | Status: DC
  Administered 2020-09-25: 40 mg via ORAL
  Filled 2020-09-24: qty 2

## 2020-09-24 MED ORDER — SODIUM CHLORIDE 0.9 % IV SOLN
1.0000 g | INTRAVENOUS | Status: DC
  Administered 2020-09-25 (×2): 1 g via INTRAVENOUS
  Filled 2020-09-24 (×2): qty 10
  Filled 2020-09-24: qty 1

## 2020-09-24 NOTE — H&P (Signed)
Alexandra Martinez KXF:[id number] DOB: March 14, 1930 DOA: 09/24/2020   PCP: Alexandra Mc, MD   Outpatient Specialists:   CARDSMicah Martinez,    NEurology  Dr. Delice Martinez   Patient arrived to ER on 09/24/20 at 1715 Referred by Attending Alexandra Divine, MD   Patient coming from:  From facility [facility]  Chief Complaint:   Chief Complaint  Patient presents with  . Fall    HPI: Alexandra Martinez is a 87 y.o. female with medical history significant of  Dementia, atrial fibrillation, GERD, HLD, Hypothyroidism, HTN    Presented with  Fall at memory unit hitting her head no LOC Family states she wanders at night  She has hx of a.fib and was on Xarelto up to 1 m ago but was taken off due tot repeated falls and head injuries  patietn unable to provide much history due to dementia   Has  been vaccinated against COVID and boosted   Initial COVID TEST   in house  PCR testing  Pending  No results found for: SARSCOV2NAA   Regarding pertinent Chronic problems:     Hyperlipidemia - not on statins Lipid Panel     Component Value Date/Time   CHOL 217 (H) 07/16/2013 1004   TRIG 88.0 07/16/2013 1004   HDL 41.70 07/16/2013 1004   CHOLHDL 5 07/16/2013 1004   VLDL 17.6 07/16/2013 1004   LDLCALC 158 (H) 07/16/2013 1004     HTN on metoprolol   A. Fib -  - CHA2DS2 vas score   6     Not on anticoagulation secondary to Risk of Falls,         -  Rate control:  Currently controlled with Metoprolol     Dementia - on Exelon patch     Chronic anemia - baseline hg Hemoglobin & Hematocrit  Recent Labs    03/31/20 1221 09/24/20 1840  HGB 11.1* 10.8*    While in ER: Ua worrisome for UTI CT showing possible CVA MRI     ED Triage Vitals  Enc Vitals Group     BP 09/24/20 1724 (!) 150/51     Pulse Rate 09/24/20 1724 72     Resp 09/24/20 1724 18     Temp 09/24/20 1726 97.7 F (36.5 C)     Temp Source 09/24/20 1726 Oral     SpO2 09/24/20 1724 97 %     Weight 09/24/20 1726 134 lb 7.7  oz (61 kg)     Height 09/24/20 1726 5' (1.524 m)     Head Circumference --      Peak Flow --      Pain Score --      Pain Loc --      Pain Edu? --      Excl. in [place]? --   TMAX(24)@     _________________________________________ Significant initial  Findings: Abnormal Labs Reviewed  COMPREHENSIVE METABOLIC PANEL - Abnormal; Notable for the following components:      Result Value   Creatinine, Ser 1.19 (*)    Total Protein 6.3 (*)    GFR, Estimated 44 (*)    All other components within normal limits  CBC WITH DIFFERENTIAL/PLATELET - Abnormal; Notable for the following components:   RBC 3.50 (*)    Hemoglobin 10.8 (*)    HCT 32.6 (*)    Platelets 143 (*)    All other components within normal limits  URINALYSIS, COMPLETE (UACMP) WITH MICROSCOPIC - Abnormal; Notable for the following  components:   Color, Urine YELLOW (*)    APPearance CLOUDY (*)    Hgb urine dipstick SMALL (*)    Nitrite POSITIVE (*)    Leukocytes,Ua LARGE (*)    WBC, UA >50 (*)    Bacteria, UA FEW (*)    All other components within normal limits   ____________________________________________ Ordered CT HEAD possible acute CVA  CXR - NON acute   Fem, pelvis lumbar - non acute    ECG: Ordered Personally reviewed by me showing: HR : 79 Rhythm: a.fib?  no evidence of ischemic changes QTC 454    The recent clinical data is shown below. Vitals:   09/24/20 1726 09/24/20 1948 09/24/20 2000 09/24/20 2200  BP:  (!) 164/68 (!) 154/90 (!) 169/92  Pulse:  85 89 72  Resp:  15  16  Temp: 97.7 F (36.5 C)     TempSrc: Oral     SpO2:  96% 100% 100%  Weight: 61 kg     Height: 5' (1.524 m)      WBC     Component Value Date/Time   WBC 6.4 09/24/2020 1840   LYMPHSABS 1.3 09/24/2020 1840   LYMPHSABS 1.4 09/28/2012 1117   MONOABS 0.7 09/24/2020 1840   MONOABS 0.4 09/28/2012 1117   EOSABS 0.1 09/24/2020 1840   EOSABS 0.1 09/28/2012 1117   BASOSABS 0.0 09/24/2020 1840   BASOSABS 0.1 09/28/2012 1117      Lactic Acid, Venous    Component Value Date/Time   LATICACIDVEN 1.0 01/25/2018 0044      UA  evidence of UTI     Urine analysis:    Component Value Date/Time   COLORURINE YELLOW (A) 09/24/2020 1856   APPEARANCEUR CLOUDY (A) 09/24/2020 1856   APPEARANCEUR Cloudy 02/05/2014 1340   LABSPEC 1.008 09/24/2020 1856   LABSPEC 1.014 02/05/2014 1340   PHURINE 6.0 09/24/2020 1856   GLUCOSEU NEGATIVE 09/24/2020 1856   GLUCOSEU NEGATIVE 09/19/2018 1407   HGBUR SMALL (A) 09/24/2020 1856   BILIRUBINUR NEGATIVE 09/24/2020 1856   BILIRUBINUR neg 01/02/2015 1420   BILIRUBINUR Negative 02/05/2014 1340   KETONESUR NEGATIVE 09/24/2020 1856   PROTEINUR NEGATIVE 09/24/2020 1856   UROBILINOGEN 0.2 09/19/2018 1407   NITRITE POSITIVE (A) 09/24/2020 1856   LEUKOCYTESUR LARGE (A) 09/24/2020 1856   LEUKOCYTESUR 3+ 02/05/2014 1340    Results for orders placed or performed in visit on 09/19/18  Urine Culture     Status: None   Collection Time: 09/19/18  2:07 PM   Specimen: Blood  Result Value Ref Range Status   MICRO NUMBER: [id number]  Final   SPECIMEN QUALITY: Adequate  Final   Sample Source NOT GIVEN  Final   STATUS: FINAL  Final   Result: No Growth  Final   _______________________________________________ Hospitalist was called for admission for CVA  The following Work up has been ordered so far:  Orders Placed This Encounter  Procedures  . CT Head Wo Contrast  . CT Cervical Spine Wo Contrast  . DG Lumbar Spine 2-3 Views  . DG Pelvis 1-2 Views  . DG Femur Min 2 Views Left  . DG Femur Min 2 Views Right  . MR BRAIN WO CONTRAST  . MR ANGIO HEAD WO CONTRAST  . Comprehensive metabolic panel  . CBC with Differential  . Urinalysis, Complete w Microscopic  . Consult to hospitalist  . ED EKG  . EKG 12-Lead      Following Medications were ordered in ER: Medications - No data to  display      Consult Orders  (From admission, onward)         Start     Ordered   09/24/20 2213  Consult  to hospitalist  Once       Provider:  (Not yet assigned)  Question Answer Comment  Place call to: [phone]   Reason for Consult Admit   Diagnosis/Clinical Info for Consult: Fall, uti, acute vs subacute CVA.  Patient from long-term care facility with dementia.  Witnessed fall today striking her head. CT/MRI findings concerning for acute vs subacute CVA      09/24/20 2213            OTHER Significant initial  Findings:  labs showing:    Recent Labs  Lab 09/24/20 1840  NA 139  K 3.8  CO2 27  GLUCOSE 97  BUN 18  CREATININE 1.19*  CALCIUM 8.9    Cr   stable,   Lab Results  Component Value Date   CREATININE 1.19 (H) 09/24/2020   CREATININE 1.00 06/29/2019   CREATININE 1.29 (H) 03/27/2019    Recent Labs  Lab 09/24/20 1840  AST 18  ALT 9  ALKPHOS 59  BILITOT 0.7  PROT 6.3*  ALBUMIN 3.7   Lab Results  Component Value Date   CALCIUM 8.9 09/24/2020   PHOS 4.3 09/05/2016      Plt: Lab Results  Component Value Date   PLT 143 (L) 09/24/2020         Recent Labs  Lab 09/24/20 1840  WBC 6.4  NEUTROABS 4.2  HGB 10.8*  HCT 32.6*  MCV 93.1  PLT 143*    HG/HCT   Stable,     Component Value Date/Time   HGB 10.8 (L) 09/24/2020 1840   HGB 11.8 (L) 09/28/2012 1117   HCT 32.6 (L) 09/24/2020 1840   HCT 34.4 (L) 09/28/2012 1117   MCV 93.1 09/24/2020 1840   MCV 89 09/28/2012 1117         Cultures:    Component Value Date/Time   SDES  06/03/2017 1622    URINE, RANDOM Performed at [facility], [address] [place], [place] [address]    [facility]  06/03/2017 1622    NONE Performed at [facility], [address] [place], [place] [address]    CULT MULTIPLE SPECIES PRESENT, SUGGEST RECOLLECTION (A) 06/03/2017 1622   REPTSTATUS 06/05/2017 FINAL 06/03/2017 1622     Radiological Exams on Admission: DG Lumbar Spine 2-3 Views  Result Date: 09/24/2020 CLINICAL DATA:  Fall EXAM: LUMBAR SPINE - 2-3 VIEW COMPARISON:  None. FINDINGS:  Posterior fusion changes from L2-L5. No hardware complicating feature. Degenerative disc disease at L1-2. Normal alignment. No fracture. IMPRESSION: New postoperative and degenerative changes. No acute bony abnormality. Electronically Signed   By: Rolm Baptise M.D.   On: 09/24/2020 18:31   DG Pelvis 1-2 Views  Result Date: 09/24/2020 CLINICAL DATA:  Fall EXAM: PELVIS - 1-2 VIEW COMPARISON:  06/29/2019 FINDINGS: Degenerative changes in the hips, right greater than left. No acute bony abnormality. Specifically, no fracture, subluxation, or dislocation. Postoperative changes in the lower lumbar spine. IMPRESSION: No acute bony abnormality. Electronically Signed   By: Rolm Baptise M.D.   On: 09/24/2020 18:28   CT Head Wo Contrast  Result Date: 09/24/2020 CLINICAL DATA:  Fall EXAM: CT HEAD WITHOUT CONTRAST TECHNIQUE: Contiguous axial images were obtained from the base of the skull through the vertex without intravenous contrast. COMPARISON:  08/29/2019 FINDINGS: Brain: There  is atrophy and chronic small vessel disease changes. Low-density throughout the left cerebellar hemisphere, new since prior study concerning for acute infarct. No hemorrhage. Low-density also noted in the occipital lobes bilaterally, right greater than left, more pronounced than prior study. Vascular: No hyperdense vessel or unexpected calcification. Skull: No acute calvarial abnormality. Sinuses/Orbits: No acute findings Other: None IMPRESSION: Low-density in the left cerebellar hemisphere concerning for acute infarct. Low-density and bilateral occipital lobes, right greater than left. This is more pronounced than prior study but could be related to acute to subacute infarcts or PRES. These both can be further evaluated with MRI. Atrophy, chronic small vessel disease. Electronically Signed   By: Rolm Baptise M.D.   On: 09/24/2020 18:43   CT Cervical Spine Wo Contrast  Result Date: 09/24/2020 CLINICAL DATA:  Fall EXAM: CT CERVICAL SPINE  WITHOUT CONTRAST TECHNIQUE: Multidetector CT imaging of the cervical spine was performed without intravenous contrast. Multiplanar CT image reconstructions were also generated. COMPARISON:  None. FINDINGS: Alignment: Normal Skull base and vertebrae: No acute fracture. No primary bone lesion or focal pathologic process. Soft tissues and spinal canal: No prevertebral fluid or swelling. No visible canal hematoma. Disc levels: Disc space narrowing and spurring. Bilateral diffuse degenerative facet disease, left greater than right. Upper chest: No acute findings Other: None IMPRESSION: Diffuse degenerative disc and facet disease. No acute bony abnormality. Electronically Signed   By: Rolm Baptise M.D.   On: 09/24/2020 18:45   MR ANGIO HEAD WO CONTRAST  Result Date: 09/24/2020 CLINICAL DATA:  Initial evaluation for acute fall, concern for acute infarct on prior CT. EXAM: MRI HEAD WITHOUT CONTRAST MRA HEAD WITHOUT CONTRAST TECHNIQUE: Multiplanar, multi-echo pulse sequences of the brain and surrounding structures were acquired without intravenous contrast. Angiographic images of the Circle of Willis were acquired using MRA technique without intravenous contrast. COMPARISON: No pertinent prior exam. COMPARISON:  Prior CT from earlier the same day. FINDINGS: MRI HEAD FINDINGS Brain: Examination technically limited as the patient was unable to tolerate the full length of the study. Diffusion-weighted sequences and sagittal T1 weighted sequence only were performed. Additionally, provided images are markedly degraded by motion artifact. Diffusion-weighted imaging demonstrates no evidence for acute ischemic infarct. There is facilitated diffusion at the left cerebellum on ADC map, corresponding with abnormality on prior CT, suspected to reflect changes of a subacute ischemic infarct, not well visualized on this limited exam (series 3, image 9). Otherwise, gray-white matter differentiation grossly maintained. No other visible  acute intracranial abnormality. Moderately advanced temporal lobe predominant cerebral atrophy. Probable underlying chronic microvascular ischemic disease. No visible mass lesion, mass effect, or midline shift. Mild ventricular prominence related to global parenchymal volume loss without hydrocephalus. No extra-axial fluid collection. Vascular: Not well assessed on this limited exam. Skull and upper cervical spine: Not well assessed on this limited exam. Sinuses/Orbits: Not well assessed on this limited exam. Other: None. MRA HEAD FINDINGS ANTERIOR CIRCULATION: Examination technically limited by extensive motion artifact. Both internal carotid arteries are grossly patent through the siphons to the termini. Evaluation for potential stenosis limited on this exam. Probable dominant right A1 segment. Anterior cerebral arteries grossly patent to their distal aspects. M1 segments grossly perfused bilaterally. Some symmetric flow is seen within the right MCA branches bilaterally, not well assessed due to motion. POSTERIOR CIRCULATION: Visualized distal V4 segments grossly patent to the vertebrobasilar junction. Neither PICA well visualized or evaluated. Basilar grossly patent to its distal aspect. Neither superior cerebellar artery or posterior cerebral artery well seen or  evaluated on this motion degraded exam. No obvious aneurysm or other vascular abnormality. IMPRESSION: MRI HEAD IMPRESSION: 1. Technically limited exam due to extensive motion artifact and the patient's inability to tolerate the full length of the study. 2. No evidence for acute ischemic infarct. Evidence for facilitated diffusion at the left cerebellum, corresponding with abnormality on prior CT, suspected to reflect changes of a subacute/recent infarct. 3. No other definite acute intracranial abnormality. MRA HEAD IMPRESSION: 1. Technically limited exam due to extensive motion artifact. 2. Gross patency of the major intracranial arterial vasculature.  Evaluation for focal stenoses fairly limited on this exam. Electronically Signed   By: Jeannine Boga M.D.   On: 09/24/2020 21:28   MR BRAIN WO CONTRAST  Result Date: 09/24/2020 CLINICAL DATA:  Initial evaluation for acute fall, concern for acute infarct on prior CT. EXAM: MRI HEAD WITHOUT CONTRAST MRA HEAD WITHOUT CONTRAST TECHNIQUE: Multiplanar, multi-echo pulse sequences of the brain and surrounding structures were acquired without intravenous contrast. Angiographic images of the Circle of Willis were acquired using MRA technique without intravenous contrast. COMPARISON: No pertinent prior exam. COMPARISON:  Prior CT from earlier the same day. FINDINGS: MRI HEAD FINDINGS Brain: Examination technically limited as the patient was unable to tolerate the full length of the study. Diffusion-weighted sequences and sagittal T1 weighted sequence only were performed. Additionally, provided images are markedly degraded by motion artifact. Diffusion-weighted imaging demonstrates no evidence for acute ischemic infarct. There is facilitated diffusion at the left cerebellum on ADC map, corresponding with abnormality on prior CT, suspected to reflect changes of a subacute ischemic infarct, not well visualized on this limited exam (series 3, image 9). Otherwise, gray-white matter differentiation grossly maintained. No other visible acute intracranial abnormality. Moderately advanced temporal lobe predominant cerebral atrophy. Probable underlying chronic microvascular ischemic disease. No visible mass lesion, mass effect, or midline shift. Mild ventricular prominence related to global parenchymal volume loss without hydrocephalus. No extra-axial fluid collection. Vascular: Not well assessed on this limited exam. Skull and upper cervical spine: Not well assessed on this limited exam. Sinuses/Orbits: Not well assessed on this limited exam. Other: None. MRA HEAD FINDINGS ANTERIOR CIRCULATION: Examination technically limited  by extensive motion artifact. Both internal carotid arteries are grossly patent through the siphons to the termini. Evaluation for potential stenosis limited on this exam. Probable dominant right A1 segment. Anterior cerebral arteries grossly patent to their distal aspects. M1 segments grossly perfused bilaterally. Some symmetric flow is seen within the right MCA branches bilaterally, not well assessed due to motion. POSTERIOR CIRCULATION: Visualized distal V4 segments grossly patent to the vertebrobasilar junction. Neither PICA well visualized or evaluated. Basilar grossly patent to its distal aspect. Neither superior cerebellar artery or posterior cerebral artery well seen or evaluated on this motion degraded exam. No obvious aneurysm or other vascular abnormality. IMPRESSION: MRI HEAD IMPRESSION: 1. Technically limited exam due to extensive motion artifact and the patient's inability to tolerate the full length of the study. 2. No evidence for acute ischemic infarct. Evidence for facilitated diffusion at the left cerebellum, corresponding with abnormality on prior CT, suspected to reflect changes of a subacute/recent infarct. 3. No other definite acute intracranial abnormality. MRA HEAD IMPRESSION: 1. Technically limited exam due to extensive motion artifact. 2. Gross patency of the major intracranial arterial vasculature. Evaluation for focal stenoses fairly limited on this exam. Electronically Signed   By: Jeannine Boga M.D.   On: 09/24/2020 21:28   DG Femur Min 2 Views Left  Result Date: 09/24/2020 CLINICAL  DATA:  Fall EXAM: LEFT FEMUR 2 VIEWS COMPARISON:  None. FINDINGS: No acute bony abnormality. Specifically, no fracture, subluxation, or dislocation. Joint space narrowing within the knee joint. No joint effusion. Vascular calcifications noted. IMPRESSION: No acute bony abnormality. Electronically Signed   By: Rolm Baptise M.D.   On: 09/24/2020 18:29   DG Femur Min 2 Views Right  Result Date:  09/24/2020 CLINICAL DATA:  Fall EXAM: RIGHT FEMUR 2 VIEWS COMPARISON:  None. FINDINGS: No acute bony abnormality. Specifically, no fracture, subluxation, or dislocation. Mild degenerative changes in the right hip joint with joint space narrowing and spurring. Vascular calcifications. IMPRESSION: No acute bony abnormality. Electronically Signed   By: Rolm Baptise M.D.   On: 09/24/2020 18:29   _______________________________________________________________________________________________________ Latest  Blood pressure (!) 169/92, pulse 72, temperature 97.7 F (36.5 C), temperature source Oral, resp. rate 16, height 5' (1.524 m), weight 61 kg, SpO2 100 %.   Review of Systems:    Pertinent positives include: confusion, fall  Constitutional:  No weight loss, night sweats, Fevers, chills, fatigue, weight loss  HEENT:  No headaches, Difficulty swallowing,Tooth/dental problems,Sore throat,  No sneezing, itching, ear ache, nasal congestion, post nasal drip,  Cardio-vascular:  No chest pain, Orthopnea, PND, anasarca, dizziness, palpitations.no Bilateral lower extremity swelling  GI:  No heartburn, indigestion, abdominal pain, nausea, vomiting, diarrhea, change in bowel habits, loss of appetite, melena, blood in stool, hematemesis Resp:  no shortness of breath at rest. No dyspnea on exertion, No excess mucus, no productive cough, No non-productive cough, No coughing up of blood.No change in color of mucus.No wheezing. Skin:  no rash or lesions. No jaundice GU:  no dysuria, change in color of urine, no urgency or frequency. No straining to urinate.  No flank pain.  Musculoskeletal:  No joint pain or no joint swelling. No decreased range of motion. No back pain.  Psych:  No change in mood or affect. No depression or anxiety. No memory loss.  Neuro: no localizing neurological complaints, no tingling, no weakness, no double vision, no gait abnormality, no slurred speech, no   All systems reviewed and  apart from [place] all are negative _______________________________________________________________________________________________ Past Medical History:   Past Medical History:  Diagnosis Date  . Anemia    r/t uterine fibroids  . Atrial fib/flutter, transient    [facility] admission for RVR 03/2009  . DDD (degenerative disc disease)   . Dysrhythmia    A-FIB  . GERD (gastroesophageal reflux disease)   . H/O bladder infections   . H/O hiatal hernia   . H/O thyroid nodule    surgically removed  . Heart murmur    moderate MR/TR 08/2013 echo [name])  . Hemorrhoids   . Hiatal hernia with gastroesophageal reflux 2008   by EGD,  [name]  . High cholesterol   . History of uterine fibroid   . Hydronephrosis    uretero- with congenital UPJ obstruction- Dr. Edrick Oh  . Hydronephrosis with ureteropelvic junction obstruction   . Hypertension    [facility]  . Hypothyroidism   . Nocturia   . Osteoporosis   . Polyuria   . Recurrent UTI      Past Surgical History:  Procedure Laterality Date  . ADENOIDECTOMY N/A 01/05/2016   Procedure: ADENOIDECTOMY;  Surgeon: Margaretha Sheffield, MD;  Location: [facility] ORS;  Service: ENT;  Laterality: N/A;  . BACK SURGERY    . CATARACT EXTRACTION     left  . CERVICAL DISC SURGERY  1980's  . CHOLECYSTECTOMY    .  NASOPHARYNGOSCOPY  01/05/2016   Procedure: NASOPHARYNGOSCOPY;  Surgeon: Margaretha Sheffield, MD;  Location: [facility] ORS;  Service: ENT;;  . THYROID LOBECTOMY     partial, right lobe  . TOTAL ABDOMINAL HYSTERECTOMY W/ BILATERAL SALPINGOOPHORECTOMY      Social History:  Ambulatory  Gilford Rile      reports that she has never smoked. She has never used smokeless tobacco. She reports that she does not drink alcohol and does not use drugs.   Family History:   Family History  Problem Relation Age of Onset  . Heart disease Mother   . Coronary artery disease Father        MI's    ______________________________________________________________________________________________ Allergies: Allergies  Allergen Reactions  . Celebrex [Celecoxib] Other (See Comments)  . Donepezil Other (See Comments)    Stomach pains  . Loratadine-Pseudoephedrine Er     Other reaction(s): Unknown  . Venlafaxine Other (See Comments)  . Vioxx [Rofecoxib] Other (See Comments)  . Codeine Itching and Rash  . Dexilant [Dexlansoprazole] Rash  . Eliquis [Apixaban] Rash     Prior to Admission medications   Medication Sig Start Date End Date Taking? Authorizing Provider  acetaminophen (TYLENOL) 500 MG tablet Take 2 tablets (1,000 mg total) by mouth every 12 (twelve) hours. 07/17/19   Alexandra Mc, MD  ALPRAZolam [name]) 0.25 MG tablet Take 1 tablet (0.25 mg total) by mouth at bedtime as needed for anxiety or sleep. 07/17/20   Alexandra Mc, MD  alum & mag hydroxide-simeth (MAALOX PLUS) 400-400-40 MG/5ML suspension Take 10 mLs by mouth every 8 (eight) hours as needed for indigestion. 11/01/19   Alexandra Mc, MD  Cholecalciferol 250 MCG (10000 UT) CAPS Take by mouth.    [provider]  levothyroxine (SYNTHROID) 50 MCG tablet TAKE ONE TABLET EVERY DAY 04/26/19   Alexandra Mc, MD  losartan (COZAAR) 100 MG tablet Take 1 tablet (100 mg total) by mouth daily. 06/09/20   Alexandra Mc, MD  memantine (NAMENDA) 10 MG tablet Take 1 tablet every night for 2 weeks, then increase to 1 tab twice a day and continue 02/21/20   Cameron Sprang, MD  metoprolol tartrate (LOPRESSOR) 50 MG tablet Take 50 mg by mouth 2 (two) times daily.    [provider]  mirtazapine (REMERON) 15 MG tablet Take 1 tablet (15 mg total) by mouth at bedtime. 08/29/19   Alexandra Mc, MD  ondansetron (ZOFRAN ODT) 4 MG disintegrating tablet Take 1 tablet (4 mg total) by mouth every 8 (eight) hours as needed for nausea or vomiting. 11/01/19   Alexandra Mc, MD  ondansetron (ZOFRAN) 4 MG tablet Take by mouth.     [provider]  pantoprazole (PROTONIX) 40 MG tablet Take 1 tablet (40 mg total) by mouth in the morning and at bedtime. 10/18/19   Alexandra Mc, MD  PARoxetine (PAXIL) 10 MG tablet Take 1 tablet (10 mg total) by mouth daily. 08/29/19   Alexandra Mc, MD  rivastigmine (EXELON) 3 MG capsule Take 3 mg by mouth at bedtime. 02/21/20   [provider]  Syringe/Needle, Disp, (SYRINGE 3CC/23GX1") 23G X 1" 3 ML MISC Use as directed With B-12 Injections 04/02/20   Alexandra Mc, MD  vitamin B-12 (CYANOCOBALAMIN) 1000 MCG tablet Take 1 tablet (1,000 mcg total) by mouth daily. 04/02/20   Alexandra Mc, MD  XARELTO 15 MG TABS tablet Take 15 mg by mouth daily. 03/03/19   [provider]  ___________________________________________________________________________________________________ Physical Exam: Vitals with BMI 09/24/2020 09/24/2020 09/24/2020  Height - - -  Weight - - -  BMI - - -  Systolic 226 333 545  Diastolic 92 90 68  Pulse 72 89 85     1. General:  in No Acute distress   Chronically ill  -appearing 2. Psychological: Alert and  Oriented to self 3. Head/ENT:  Dry Mucous Membranes                          Head Non traumatic, neck supple                         Poor Dentition 4. SKIN:   decreased Skin turgor,  Skin clean Dry and intact no rash 5. Heart: Regular rate and rhythm no   Murmur, no Rub or gallop 6. Lungs:  no wheezes or crackles   7. Abdomen: Soft,  non-tender, Non distended bowel sounds present 8. Lower extremities: no clubbing, cyanosis, no edema 9. Neurologically  appears strength 5 out of 5 in all 4 extremities cranial nerves II through XII intact Not able to fully cooperte 10. MSK: Normal range of motion    Chart has been reviewed  ______________________________________________________________________________________________  Assessment/Plan  87 y.o. female with medical history significant of  Dementia, atrial fibrillation, GERD, HLD,  Hypothyroidism, HTN a.fib  Admitted for subacute CVA  Present on Admission: . CVA (cerebral vascular accident) ([place]) -  - will admit based on TIA/CVA protocol,         Monitor on Tele       MRA/MRI  Resulted - showing acute ischemic CVA      Carotid Doppler        Echo to evaluate for possible embolic source,        obtain cardiac enzymes,  ECG,   Lipid panel, TSH.        Order PT/OT evaluation.          passes swallow eval         Pt is allergic to NSAIDS will need to clarify if can tolerate aspirin       Allow permissive Hypertension keep BP <220/120        Neurology please consult in AM    . Controlled atrial fibrillation ([place]) - restart metoprolol when able to tolerate, not on blood thinner due to falls will need to discuss with family long term risk and benefits  . Acute lower UTI -  - treat with Rocephin        await results of urine culture and adjust antibiotic coverage as needed  . Anemia - chronic obtain anemia panel   . Acquired thrombophilia ([place]) - not  on anticoagulation due to falls  . Hypothyroid - - Check TSH continue home medications at current dose    Other plan as per orders.  DVT prophylaxis:  SCD      Code Status:    Code Status: Prior   DNR/DNI  as per family  I had personally discussed CODE STATUS with patient and family     Family Communication:   Family  at  Bedside  plan of care was discussed  with   Son, Daughter  Disposition Plan:                             Back to current facility when stable  Following barriers for discharge:                                                          Anemia stable                              Will need consultants to evaluate patient prior to discharge                       Would benefit from PT/OT eval prior to DC  Ordered                   Swallow eval - SLP ordered                                       Transition of care consulted                   Nutrition     consulted                  Wound care  consulted                   Palliative care    consulted                   Behavioral health  consulted                    Consults called: neurology consult in AM  Admission status:  ED Disposition    ED Disposition Condition [place]: [place] [[id number]]  Level of Care: Med-Surg [16]  Covid Evaluation: Asymptomatic Screening Protocol (No Symptoms)  Diagnosis: CVA (cerebral vascular accident) [facility]) [[id number]]  Admitting Physician: Toy Baker [[id number]]  Attending Physician: Toy Baker [[id number]]        Obs    Level of care    tele   indefinitely please discontinue once patient no longer qualifies  COVID-19 Labs    No results found for: [id number]   Precautions: admitted as   asymptomatic screening protocol    PPE: Used by the provider:   N99  eye [place],  Gloves    Alexandra Martinez 09/24/2020, 11:53 PM    [facility]     after 2 AM please page floor coverage PA If 7AM-7PM, please contact the day team taking care of the patient using [email or web address]   Patient was evaluated in the context of the global COVID-19 pandemic, which necessitated consideration that the patient might be at risk for infection with the SARS-CoV-2 virus that causes COVID-19. Institutional protocols and algorithms that pertain to the evaluation of patients at risk for COVID-19 are in a state of rapid change based on information released by regulatory bodies including the CDC and federal and state organizations. These policies and algorithms were followed during the patient's care.

## 2020-09-24 NOTE — ED Triage Notes (Signed)
Patient BIBA from [facility]. Patient sent for witnessed mechanical trip and fall. Patient hit head during fall, staff denies LOC. Patient A&OX1 at baseline. Patient denies pain, states "I dont feel good".

## 2020-09-24 NOTE — ED Notes (Signed)
Patient ambulated to and from restroom with 1 assist. Family at bedside.
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
## 2020-09-24 NOTE — ED Notes (Signed)
Patient returned from MRI.

## 2020-09-24 NOTE — ED Notes (Signed)
Patient transported to X-ray 

## 2020-09-25 ENCOUNTER — Observation Stay
Admit: 2020-09-25 | Discharge: 2020-09-25 | Disposition: A | Discharge status: Home / Self Care | Payer: Medicare Other | Attending: Internal Medicine | Admitting: Internal Medicine

## 2020-09-25 ENCOUNTER — Observation Stay: Payer: Medicare Other

## 2020-09-25 DIAGNOSIS — I639 Cerebral infarction, unspecified: Principal | ICD-10-CM

## 2020-09-25 DIAGNOSIS — D6869 Other thrombophilia: Secondary | ICD-10-CM | POA: Diagnosis not present

## 2020-09-25 DIAGNOSIS — I6523 Occlusion and stenosis of bilateral carotid arteries: Secondary | ICD-10-CM | POA: Diagnosis not present

## 2020-09-25 DIAGNOSIS — G319 Degenerative disease of nervous system, unspecified: Secondary | ICD-10-CM | POA: Diagnosis not present

## 2020-09-25 DIAGNOSIS — Z8673 Personal history of transient ischemic attack (TIA), and cerebral infarction without residual deficits: Secondary | ICD-10-CM | POA: Diagnosis not present

## 2020-09-25 LAB — VITAMIN B12: Vitamin B-12: 3781 pg/mL — ABNORMAL HIGH (ref 180–914)

## 2020-09-25 LAB — RETICULOCYTES
Immature Retic Fract: 11.8 % (ref 2.3–15.9)
RBC.: 3.49 MIL/uL — ABNORMAL LOW (ref 3.87–5.11)
Retic Count, Absolute: 47.8 10*3/uL (ref 19.0–186.0)
Retic Ct Pct: 1.4 % (ref 0.4–3.1)

## 2020-09-25 LAB — LIPID PANEL
Cholesterol: 170 mg/dL (ref 0–200)
HDL: 34 mg/dL — ABNORMAL LOW (ref >40)
LDL Cholesterol: 116 mg/dL — ABNORMAL HIGH (ref 0–99)
Total CHOL/HDL Ratio: 5 RATIO
Triglycerides: 98 mg/dL (ref <150)
VLDL: 20 mg/dL (ref 0–40)

## 2020-09-25 LAB — FOLATE: Folate: 7.1 ng/mL (ref >5.9)

## 2020-09-25 LAB — ECHOCARDIOGRAM COMPLETE
Height: 60 in
S' Lateral: 2.67 cm
Weight: 2091.72 oz

## 2020-09-25 LAB — IRON AND TIBC
Iron: 46 ug/dL (ref 28–170)
Saturation Ratios: 16 % (ref 10.4–31.8)
TIBC: 283 ug/dL (ref 250–450)
UIBC: 237 ug/dL

## 2020-09-25 LAB — TSH: TSH: 1.455 u[IU]/mL (ref 0.350–4.500)

## 2020-09-25 LAB — SARS CORONAVIRUS 2 (TAT 6-24 HRS): SARS Coronavirus 2: NEGATIVE

## 2020-09-25 LAB — FERRITIN: Ferritin: 64 ng/mL (ref 11–307)

## 2020-09-25 MED ORDER — SODIUM CHLORIDE 0.9 % IV SOLN: INTRAVENOUS | Status: DC

## 2020-09-25 MED ORDER — ACETAMINOPHEN 650 MG RE SUPP: 650.0000 mg | RECTAL | Status: DC | PRN

## 2020-09-25 MED ORDER — LEVOTHYROXINE SODIUM 50 MCG PO TABS
50.0000 ug | ORAL_TABLET | Freq: Every day | ORAL | Status: DC
  Administered 2020-09-25 – 2020-09-29 (×5): 50 ug via ORAL
  Filled 2020-09-25 (×6): qty 1

## 2020-09-25 MED ORDER — STROKE: EARLY STAGES OF RECOVERY BOOK: Freq: Once | Status: AC

## 2020-09-25 MED ORDER — LORAZEPAM 2 MG/ML IJ SOLN: 0.5000 mg | Freq: Once | INTRAMUSCULAR | Status: DC

## 2020-09-25 MED ORDER — ACETAMINOPHEN 160 MG/5ML PO SOLN
650.0000 mg | ORAL | Status: DC | PRN
  Filled 2020-09-25: qty 20.3

## 2020-09-25 MED ORDER — ASPIRIN 300 MG RE SUPP: 300.0000 mg | Freq: Every day | RECTAL | Status: DC

## 2020-09-25 MED ORDER — ACETAMINOPHEN 325 MG PO TABS
650.0000 mg | ORAL_TABLET | ORAL | Status: DC | PRN
  Administered 2020-09-30: 650 mg via ORAL
  Filled 2020-09-25: qty 2

## 2020-09-25 MED ORDER — ASPIRIN 325 MG PO TABS
325.0000 mg | ORAL_TABLET | Freq: Every day | ORAL | Status: DC
  Administered 2020-09-25: 18:00:00 325 mg via ORAL
  Filled 2020-09-25 (×2): qty 1

## 2020-09-25 NOTE — Evaluation (Signed)
Occupational Therapy Evaluation Patient Details Name: Alexandra Martinez MRN: [id number] DOB: [birth year] Today's Date: 09/25/2020    History of Present Illness Alexandra Martinez is an 87 y.o. female with medical history significant of dementia, atrial fibrillation, GERD, HLD, Hypothyroidism, HTN a.fib  Admitted for fall and subacute CVA   Clinical Impression   Alexandra Martinez is oriented to self only. Denies pain. Niece/HCPOA present in room, states that she is "very pleased" with memory care facility where pt lives. Pt able to engage in bed mobility, t/fs with CGA and verbal/tactile cueing; unable to follow simple, one-step directions or to retain information. Appears to be at baseline of physical performance. No ongoing OT services necessary at this time. This pt would be an appropriate candidate for palliative care consult.    Follow Up Recommendations  No OT follow up    Equipment Recommendations       Recommendations for Other Services  (palliative care consult)     Precautions / Restrictions Precautions Precautions: Fall Restrictions Weight Bearing Restrictions: No      Mobility Bed Mobility Overal bed mobility: Needs Assistance Bed Mobility: Supine to Sit     Supine to sit: Min guard     General bed mobility comments: Needs repeated verbal, tactile cues    Transfers Overall transfer level: Needs assistance Equipment used: Rolling walker (2 wheeled) Transfers: Sit to/from Stand Sit to Stand: Min assist         General transfer comment: multiple transfers performed with slow performance. Needs hands on assist. Upright posture    Balance Overall balance assessment: Needs assistance;History of Falls Sitting-balance support: Feet supported;Bilateral upper extremity supported Sitting balance-Leahy Scale: Good     Standing balance support: Bilateral upper extremity supported Standing balance-Leahy Scale: Fair Standing balance comment: pt declined (states she is too  tired)                           ADL either performed or assessed with clinical judgement   ADL Overall ADL's : Needs assistance/impaired                                             Vision Baseline Vision/History: Wears glasses Wears Glasses: At all times Patient Visual Report: No change from baseline       Perception     Praxis      Pertinent Vitals/Pain Pain Assessment: No/denies pain     Hand Dominance     Extremity/Trunk Assessment Upper Extremity Assessment Upper Extremity Assessment: Generalized weakness   Lower Extremity Assessment Lower Extremity Assessment: Generalized weakness       Communication Communication Communication: Expressive difficulties   Cognition Arousal/Alertness: Awake/alert Behavior During Therapy: WFL for tasks assessed/performed Overall Cognitive Status: History of cognitive impairments - at baseline                                 General Comments: oriented x 1 at baseline   General Comments       Exercises Other Exercises Other Exercises: DC planning with niece/HCPOA   Shoulder Instructions      Home Living Family/patient expects to be discharged to:: Skilled nursing facility  Additional Comments: memory care unit      Prior Functioning/Environment Level of Independence: Needs assistance  Gait / Transfers Assistance Needed: uses rollator for all mobility ADL's / Homemaking Assistance Needed: Max/Total A for IADL   Comments: all info obtained from neice, as pt is poor historian        OT Problem List: Decreased strength;Decreased activity tolerance;Impaired balance (sitting and/or standing);Decreased cognition      OT Treatment/Interventions:      OT Goals(Current goals can be found in the care plan section) Acute Rehab OT Goals Patient Stated Goal: to go home OT Goal Formulation: With family Time For Goal Achievement:  10/09/20 Potential to Achieve Goals: Good  OT Frequency:     Barriers to D/C:            Co-evaluation              AM-PAC OT "6 Clicks" Daily Activity     Outcome Measure Help from another person eating meals?: None Help from another person taking care of personal grooming?: A Little Help from another person toileting, which includes using toliet, bedpan, or urinal?: A Lot Help from another person bathing (including washing, rinsing, drying)?: A Lot Help from another person to put on and taking off regular upper body clothing?: A Little Help from another person to put on and taking off regular lower body clothing?: A Lot 6 Click Score: 16   End of Session    Activity Tolerance: Patient limited by fatigue;Patient tolerated treatment well Patient left: in bed;with family/visitor present;with call bell/phone within reach;with bed alarm set  OT Visit Diagnosis: History of falling (Z91.81);Unsteadiness on feet (R26.81);Other symptoms and signs involving cognitive function                Time: 1500-1510 OT Time Calculation (min): 10 min Charges:  OT General Charges $OT Visit: 1 Visit OT Evaluation $OT Eval Low Complexity: 1 Low OT Treatments $Self Care/Home Management : 8-22 mins  Alexandra Lobo, PhD, MS, OTR/L 09/25/20, 4:23 PM

## 2020-09-25 NOTE — Evaluation (Signed)
Physical Therapy Evaluation Patient Details Name: Alexandra Martinez MRN: [id number] DOB: 1929-04-28 Today's Date: 09/25/2020   History of Present Illness  Pt admitted for complaints of falling and hitting head. Workup negative for acute CVA.Marland Kitchen History includes dementia, Afib, GERD, and HTN. Per niece, pt has had multiple recent falls.  Clinical Impression  Pt is a pleasant 87 year old female who was admitted for possible CVA-negative for workup at this time. Pt demonstrates all bed mobility/transfers/ambulation at baseline level. Uses rollator at baseline, able to use RW, however needs constant hand on assist for mobility secondary to cognition. Pt is at baseline level regarding strength/cognition/mobility. Pt does not require any further PT needs at this time. Pt will be dc in house and does not require follow up. RN aware. Will dc current orders.     Follow Up Recommendations No PT follow up    Equipment Recommendations  None recommended by PT    Recommendations for Other Services       Precautions / Restrictions Precautions Precautions: Fall Restrictions Weight Bearing Restrictions: No      Mobility  Bed Mobility Overal bed mobility: Needs Assistance Bed Mobility: Supine to Sit     Supine to sit: Min guard     General bed mobility comments: needs heavy cues for sequencing and initiation of mobility towards EOB. Once seated, able to sit with supervision. Easily distracted and needs cues for attention to task    Transfers Overall transfer level: Needs assistance Equipment used: Rolling walker (2 wheeled) Transfers: Sit to/from Stand Sit to Stand: Min assist         General transfer comment: multiple transfers performed with slow performance. Needs hands on assist. Upright posture  Ambulation/Gait Ambulation/Gait assistance: Min assist Gait Distance (Feet): 60 Feet Assistive device: Rolling walker (2 wheeled) Gait Pattern/deviations: Step-through pattern      General Gait Details: needs hands on assist due to cognition as she tries to enter another patient room. needs assist for steering RW and obstacle avoidance. Reciprocal gait pattern  Stairs            Wheelchair Mobility    Modified Rankin (Stroke Patients Only)       Balance Overall balance assessment: Needs assistance;History of Falls Sitting-balance support: Feet supported;Bilateral upper extremity supported Sitting balance-Leahy Scale: Good     Standing balance support: Bilateral upper extremity supported Standing balance-Leahy Scale: Fair                               Pertinent Vitals/Pain Pain Assessment: No/denies pain    Home Living Family/patient expects to be discharged to::  (memory care)                      Prior Function Level of Independence: Needs assistance   Gait / Transfers Assistance Needed: uses rollator for all mobility     Comments: all info obtained from neice, as pt is poor historian     Hand Dominance        Extremity/Trunk Assessment   Upper Extremity Assessment Upper Extremity Assessment: Overall WFL for tasks assessed    Lower Extremity Assessment Lower Extremity Assessment: Overall WFL for tasks assessed (at baseline; equal throughout)       Communication   Communication: No difficulties  Cognition Arousal/Alertness: Awake/alert (sleepy, but awakens easily to cues) Behavior During Therapy: WFL for tasks assessed/performed Overall Cognitive Status: History of cognitive impairments - at  baseline                                 General Comments: oriented x 1 at baseline      General Comments      Exercises     Assessment/Plan    PT Assessment Patent does not need any further PT services  PT Problem List         PT Treatment Interventions      PT Goals (Current goals can be found in the Care Plan section)  Acute Rehab PT Goals Patient Stated Goal: to go home PT Goal  Formulation: All assessment and education complete, DC therapy Time For Goal Achievement: 09/25/20 Potential to Achieve Goals: Good    Frequency     Barriers to discharge        Co-evaluation               AM-PAC PT "6 Clicks" Mobility  Outcome Measure Help needed turning from your back to your side while in a flat bed without using bedrails?: A Little Help needed moving from lying on your back to sitting on the side of a flat bed without using bedrails?: A Little Help needed moving to and from a bed to a chair (including a wheelchair)?: A Little Help needed standing up from a chair using your arms (e.g., wheelchair or bedside chair)?: A Little Help needed to walk in hospital room?: A Little Help needed climbing 3-5 steps with a railing? : A Lot 6 Click Score: 17    End of Session Equipment Utilized During Treatment: Gait belt Activity Tolerance: Patient tolerated treatment well Patient left: in bed;with bed alarm set Nurse Communication: Mobility status PT Visit Diagnosis: Unsteadiness on feet (R26.81);History of falling (Z91.81)    Time: 8177-1165 PT Time Calculation (min) (ACUTE ONLY): 20 min   Charges:   PT Evaluation $PT Eval Low Complexity: Bent, PT, DPT [phone]   Ceri Mayer 09/25/2020, 2:31 PM

## 2020-09-25 NOTE — Progress Notes (Signed)
Pt arrived to unit from ED in no acute distress. Orientedx1, VSS, able to walk with assistance from wheelchair to bed. Unable to put pt in hospital gown due to refusal at this time. Assessment complete. Admission documentation unable to be completed due patient's mental status and no family at bedside. Unable to complete full NIH due patient's mentation. Fall/safety precautions initiated.

## 2020-09-25 NOTE — TOC Progression Note (Signed)
Transition of Care [facility]) - Progression Note    Patient Details  Name: Alexandra Martinez MRN: [id number] Date of Birth: 18-Jul-1929  [facility] CM/SW [place], RN Phone Number: 09/25/2020, 4:59 PM  Clinical Narrative:   TOC in to see patient and niece.  Patient is a resident of [facility].  Niece states that family is satisfied with care there and plans to have patient return there on discharge.  No further concerns at this time.  TOC contact information given, TOC to follow to discharge.    Expected Discharge Plan: Memory Care    Expected Discharge Plan and Services Expected Discharge Plan: Memory Care   Discharge Planning Services: CM Consult Post Acute Care Choice: NA Living arrangements for the past 2 months: Skilled Nursing Facility                                       Social Determinants of Health (SDOH) Interventions    Readmission Risk Interventions No flowsheet data found.

## 2020-09-25 NOTE — Progress Notes (Signed)
Pt continuously removing telemetry leads, will leave off for now and attempt to replace later. CCMD notified.

## 2020-09-25 NOTE — Progress Notes (Signed)
PROGRESS NOTE  Alexandra Martinez WEX:[id number] DOB: [birth year] DOA: 09/24/2020 PCP: Crecencio Mc, MD  HPI/Recap of past 24 hours: Alexandra Martinez is a 87 y.o. female with medical history significant of  Dementia, permanent atrial fibrillation not on [place], GERD, HLD, Hypothyroidism, HTN Presented with Fall at memory unit hitting her head no LOC.  Unclear on last known well.  Family states she wanders at night.  She was on Xarelto up to 1 month ago but was taken off due to repeated falls and head injuries.  Work up revealed subacute CVA involving left cerebellum.  Seen by neuro/stroke team.  Further work-up in progress.  09/25/20:  Seen and examined at her bedside this AM.  Minimally responsive.  Opens her eyes then falls right back to sleep.  Does not appear in distress.   Assessment/Plan: Active Problems:   Controlled atrial fibrillation (HCC)   Hypothyroid   Anemia   Acquired thrombophilia (HCC)   CVA (cerebral vascular accident) ([place])   Acute lower UTI  Subacute left cerebellar CVA (cerebral vascular accident) ([place]) -   Continue to monitor on Tele       MRA/MRI  Resulted - showing likely subacute ischemic CVA     Carotid Doppler unremarkable       Echo to evaluate for possible embolic source,       Lipid panel       PT/OT/SLP evaluation.       Neurology/stroke team following       Continue ASA and statin   Permanent atrial fibrillation (HCC) -  Restart metoprolol when able to tolerate On Metoprolol PTA for rate control Not on blood thinner due to falls will need to discuss with family long term risk and benefits  HLD LDL 116 Goal LDL less than 70 Lipitor 40 mg daily   Acute lower UTI -   Rocephin Urine culture and adjust antibiotic coverage as needed   Chronic normocytic anemia Stable Monitor H&H    Acquired thrombophilia ([place]) Not  on anticoagulation due to falls   Hypothyroid  Check TSH continue home medications at current dose        Code Status:  DNR  Family Communication: None at bedside  Disposition Plan: SNF when stable   Consultants: Neurology/stroke team.  Procedures: 2D echo.  Antimicrobials: Rocephin.  DVT prophylaxis: SCDs.  Status is: Observation    Dispo: The patient is from: SNF.               Anticipated d/c is to: SNF.               Patient currently, not stable for discharge due to ongoing work-up for stroke.    Difficult to place patient, not applicable.        Objective: Vitals:   09/25/20 0047 09/25/20 0247 09/25/20 0602 09/25/20 0752  BP: (!) 154/74 132/65 (!) 151/91 (!) 164/94  Pulse: 75 71 76 76  Resp: 15 14 14 14   Temp: 97.6 F (36.4 C) 97.9 F (36.6 C) 97.8 F (36.6 C) 98.1 F (36.7 C)  TempSrc: Axillary Oral Oral Oral  SpO2: 96% 95% 91% 91%  Weight: 59.3 kg     Height: 5' (1.524 m)       Intake/Output Summary (Last 24 hours) at 09/25/2020 1502 Last data filed at 09/25/2020 0418 Gross per 24 hour  Intake 305.04 ml  Output --  Net 305.04 ml   Filed Weights   09/24/20 1726 09/25/20 0047  Weight: 61  kg 59.3 kg    Exam:  General: 87 y.o. year-old female frail-appearing lethargic this morning.  Cardiovascular: Irregular rate and rhythm with no rubs or gallops.  No thyromegaly or JVD noted.   Respiratory: Clear to auscultation with no wheezes or rales. Good inspiratory effort. Abdomen: Soft nontender nondistended with normal bowel sounds x4 quadrants. Musculoskeletal: No lower extremity edema. Skin: No ulcerative lesions noted or rashes, Psychiatry: Unable to assess mood due to lethargy.   Data Reviewed: CBC: Recent Labs  Lab 09/24/20 1840  WBC 6.4  NEUTROABS 4.2  HGB 10.8*  HCT 32.6*  MCV 93.1  PLT 761*   Basic Metabolic Panel: Recent Labs  Lab 09/24/20 1840  NA 139  K 3.8  CL 102  CO2 27  GLUCOSE 97  BUN 18  CREATININE 1.19*  CALCIUM 8.9   GFR: Estimated Creatinine Clearance: 26.3 mL/min (A) (by C-G formula based on SCr of 1.19 mg/dL (H)). Liver  Function Tests: Recent Labs  Lab 09/24/20 1840  AST 18  ALT 9  ALKPHOS 59  BILITOT 0.7  PROT 6.3*  ALBUMIN 3.7   No results for input(s): LIPASE, AMYLASE in the last 168 hours. No results for input(s): AMMONIA in the last 168 hours. Coagulation Profile: No results for input(s): INR, PROTIME in the last 168 hours. Cardiac Enzymes: No results for input(s): CKTOTAL, CKMB, CKMBINDEX, TROPONINI in the last 168 hours. BNP (last 3 results) No results for input(s): PROBNP in the last 8760 hours. HbA1C: No results for input(s): HGBA1C in the last 72 hours. CBG: No results for input(s): GLUCAP in the last 168 hours. Lipid Profile: Recent Labs    09/25/20 0401  CHOL 170  HDL 34*  LDLCALC 116*  TRIG 98  CHOLHDL 5.0   Thyroid Function Tests: No results for input(s): TSH, T4TOTAL, FREET4, T3FREE, THYROIDAB in the last 72 hours. Anemia Panel: Recent Labs    09/25/20 0401  VITAMINB12 3,781*  FOLATE 7.1  FERRITIN 64  TIBC 283  IRON 46  RETICCTPCT 1.4   Urine analysis:    Component Value Date/Time   COLORURINE YELLOW (A) 09/24/2020 1856   APPEARANCEUR CLOUDY (A) 09/24/2020 1856   APPEARANCEUR Cloudy 02/05/2014 1340   LABSPEC 1.008 09/24/2020 1856   LABSPEC 1.014 02/05/2014 1340   PHURINE 6.0 09/24/2020 1856   GLUCOSEU NEGATIVE 09/24/2020 1856   GLUCOSEU NEGATIVE 09/19/2018 1407   HGBUR SMALL (A) 09/24/2020 1856   BILIRUBINUR NEGATIVE 09/24/2020 1856   BILIRUBINUR neg 01/02/2015 1420   BILIRUBINUR Negative 02/05/2014 1340   KETONESUR NEGATIVE 09/24/2020 1856   PROTEINUR NEGATIVE 09/24/2020 1856   UROBILINOGEN 0.2 09/19/2018 1407   NITRITE POSITIVE (A) 09/24/2020 1856   LEUKOCYTESUR LARGE (A) 09/24/2020 1856   LEUKOCYTESUR 3+ 02/05/2014 1340   Sepsis Labs: @LABRCNTIP (procalcitonin:4,lacticidven:4)  ) Recent Results (from the past 240 hour(s))  SARS CORONAVIRUS 2 (TAT 6-24 HRS) Nasopharyngeal Nasopharyngeal Swab     Status: None   Collection Time: 09/24/20 10:42  PM   Specimen: Nasopharyngeal Swab  Result Value Ref Range Status   SARS Coronavirus 2 NEGATIVE NEGATIVE Final    Comment: (NOTE) SARS-CoV-2 target nucleic acids are NOT DETECTED.  The SARS-CoV-2 RNA is generally detectable in upper and lower respiratory specimens during the acute phase of infection. Negative results do not preclude SARS-CoV-2 infection, do not rule out co-infections with other pathogens, and should not be used as the sole basis for treatment or other patient management decisions. Negative results must be combined with clinical observations, patient history, and epidemiological  information. The expected result is Negative.  Fact Sheet for Patients: SugarRoll.be  Fact Sheet for Healthcare Providers: https://www.woods-mathews.com/  This test is not yet approved or cleared by the [place] FDA and  has been authorized for detection and/or diagnosis of SARS-CoV-2 by FDA under an Emergency Use Authorization (EUA). This EUA will remain  in effect (meaning this test can be used) for the duration of the COVID-19 declaration under Se ction 564(b)(1) of the Act, 21 U.S.C. section 360bbb-3(b)(1), unless the authorization is terminated or revoked sooner.  Performed at [facility], [place] [address] [place], [place] [address]       Studies: DG Chest 2 View  Result Date: 09/24/2020 CLINICAL DATA:  Status post fall. EXAM: CHEST - 2 VIEW COMPARISON:  Chest x-ray 06/03/2017, CT abdomen pelvis 09/05/2016 FINDINGS: The heart size and mediastinal contours are unchanged. Aortic calcification. Low lung volumes with bilateral lower lobe atelectasis. No focal consolidation. No pulmonary edema. No pleural effusion. No pneumothorax. No acute osseous abnormality. IMPRESSION: No active cardiopulmonary disease. Electronically Signed   By: Iven Finn M.D.   On: 09/24/2020 23:12   DG Lumbar Spine 2-3 Views  Result Date:  09/24/2020 CLINICAL DATA:  Fall EXAM: LUMBAR SPINE - 2-3 VIEW COMPARISON:  None. FINDINGS: Posterior fusion changes from L2-L5. No hardware complicating feature. Degenerative disc disease at L1-2. Normal alignment. No fracture. IMPRESSION: New postoperative and degenerative changes. No acute bony abnormality. Electronically Signed   By: Rolm Baptise M.D.   On: 09/24/2020 18:31   DG Pelvis 1-2 Views  Result Date: 09/24/2020 CLINICAL DATA:  Fall EXAM: PELVIS - 1-2 VIEW COMPARISON:  06/29/2019 FINDINGS: Degenerative changes in the hips, right greater than left. No acute bony abnormality. Specifically, no fracture, subluxation, or dislocation. Postoperative changes in the lower lumbar spine. IMPRESSION: No acute bony abnormality. Electronically Signed   By: Rolm Baptise M.D.   On: 09/24/2020 18:28   CT Head Wo Contrast  Result Date: 09/24/2020 CLINICAL DATA:  Fall EXAM: CT HEAD WITHOUT CONTRAST TECHNIQUE: Contiguous axial images were obtained from the base of the skull through the vertex without intravenous contrast. COMPARISON:  08/29/2019 FINDINGS: Brain: There is atrophy and chronic small vessel disease changes. Low-density throughout the left cerebellar hemisphere, new since prior study concerning for acute infarct. No hemorrhage. Low-density also noted in the occipital lobes bilaterally, right greater than left, more pronounced than prior study. Vascular: No hyperdense vessel or unexpected calcification. Skull: No acute calvarial abnormality. Sinuses/Orbits: No acute findings Other: None IMPRESSION: Low-density in the left cerebellar hemisphere concerning for acute infarct. Low-density and bilateral occipital lobes, right greater than left. This is more pronounced than prior study but could be related to acute to subacute infarcts or PRES. These both can be further evaluated with MRI. Atrophy, chronic small vessel disease. Electronically Signed   By: Rolm Baptise M.D.   On: 09/24/2020 18:43   CT Cervical  Spine Wo Contrast  Result Date: 09/24/2020 CLINICAL DATA:  Fall EXAM: CT CERVICAL SPINE WITHOUT CONTRAST TECHNIQUE: Multidetector CT imaging of the cervical spine was performed without intravenous contrast. Multiplanar CT image reconstructions were also generated. COMPARISON:  None. FINDINGS: Alignment: Normal Skull base and vertebrae: No acute fracture. No primary bone lesion or focal pathologic process. Soft tissues and spinal canal: No prevertebral fluid or swelling. No visible canal hematoma. Disc levels: Disc space narrowing and spurring. Bilateral diffuse degenerative facet disease, left greater than right. Upper chest: No acute findings Other: None IMPRESSION: Diffuse degenerative disc  and facet disease. No acute bony abnormality. Electronically Signed   By: Rolm Baptise M.D.   On: 09/24/2020 18:45   MR ANGIO HEAD WO CONTRAST  Result Date: 09/24/2020 CLINICAL DATA:  Initial evaluation for acute fall, concern for acute infarct on prior CT. EXAM: MRI HEAD WITHOUT CONTRAST MRA HEAD WITHOUT CONTRAST TECHNIQUE: Multiplanar, multi-echo pulse sequences of the brain and surrounding structures were acquired without intravenous contrast. Angiographic images of the Circle of Willis were acquired using MRA technique without intravenous contrast. COMPARISON: No pertinent prior exam. COMPARISON:  Prior CT from earlier the same day. FINDINGS: MRI HEAD FINDINGS Brain: Examination technically limited as the patient was unable to tolerate the full length of the study. Diffusion-weighted sequences and sagittal T1 weighted sequence only were performed. Additionally, provided images are markedly degraded by motion artifact. Diffusion-weighted imaging demonstrates no evidence for acute ischemic infarct. There is facilitated diffusion at the left cerebellum on ADC map, corresponding with abnormality on prior CT, suspected to reflect changes of a subacute ischemic infarct, not well visualized on this limited exam (series 3,  image 9). Otherwise, gray-white matter differentiation grossly maintained. No other visible acute intracranial abnormality. Moderately advanced temporal lobe predominant cerebral atrophy. Probable underlying chronic microvascular ischemic disease. No visible mass lesion, mass effect, or midline shift. Mild ventricular prominence related to global parenchymal volume loss without hydrocephalus. No extra-axial fluid collection. Vascular: Not well assessed on this limited exam. Skull and upper cervical spine: Not well assessed on this limited exam. Sinuses/Orbits: Not well assessed on this limited exam. Other: None. MRA HEAD FINDINGS ANTERIOR CIRCULATION: Examination technically limited by extensive motion artifact. Both internal carotid arteries are grossly patent through the siphons to the termini. Evaluation for potential stenosis limited on this exam. Probable dominant right A1 segment. Anterior cerebral arteries grossly patent to their distal aspects. M1 segments grossly perfused bilaterally. Some symmetric flow is seen within the right MCA branches bilaterally, not well assessed due to motion. POSTERIOR CIRCULATION: Visualized distal V4 segments grossly patent to the vertebrobasilar junction. Neither PICA well visualized or evaluated. Basilar grossly patent to its distal aspect. Neither superior cerebellar artery or posterior cerebral artery well seen or evaluated on this motion degraded exam. No obvious aneurysm or other vascular abnormality. IMPRESSION: MRI HEAD IMPRESSION: 1. Technically limited exam due to extensive motion artifact and the patient's inability to tolerate the full length of the study. 2. No evidence for acute ischemic infarct. Evidence for facilitated diffusion at the left cerebellum, corresponding with abnormality on prior CT, suspected to reflect changes of a subacute/recent infarct. 3. No other definite acute intracranial abnormality. MRA HEAD IMPRESSION: 1. Technically limited exam due to  extensive motion artifact. 2. Gross patency of the major intracranial arterial vasculature. Evaluation for focal stenoses fairly limited on this exam. Electronically Signed   By: Jeannine Boga M.D.   On: 09/24/2020 21:28   MR BRAIN WO CONTRAST  Result Date: 09/24/2020 CLINICAL DATA:  Initial evaluation for acute fall, concern for acute infarct on prior CT. EXAM: MRI HEAD WITHOUT CONTRAST MRA HEAD WITHOUT CONTRAST TECHNIQUE: Multiplanar, multi-echo pulse sequences of the brain and surrounding structures were acquired without intravenous contrast. Angiographic images of the Circle of Willis were acquired using MRA technique without intravenous contrast. COMPARISON: No pertinent prior exam. COMPARISON:  Prior CT from earlier the same day. FINDINGS: MRI HEAD FINDINGS Brain: Examination technically limited as the patient was unable to tolerate the full length of the study. Diffusion-weighted sequences and sagittal T1 weighted sequence only were  performed. Additionally, provided images are markedly degraded by motion artifact. Diffusion-weighted imaging demonstrates no evidence for acute ischemic infarct. There is facilitated diffusion at the left cerebellum on ADC map, corresponding with abnormality on prior CT, suspected to reflect changes of a subacute ischemic infarct, not well visualized on this limited exam (series 3, image 9). Otherwise, gray-white matter differentiation grossly maintained. No other visible acute intracranial abnormality. Moderately advanced temporal lobe predominant cerebral atrophy. Probable underlying chronic microvascular ischemic disease. No visible mass lesion, mass effect, or midline shift. Mild ventricular prominence related to global parenchymal volume loss without hydrocephalus. No extra-axial fluid collection. Vascular: Not well assessed on this limited exam. Skull and upper cervical spine: Not well assessed on this limited exam. Sinuses/Orbits: Not well assessed on this  limited exam. Other: None. MRA HEAD FINDINGS ANTERIOR CIRCULATION: Examination technically limited by extensive motion artifact. Both internal carotid arteries are grossly patent through the siphons to the termini. Evaluation for potential stenosis limited on this exam. Probable dominant right A1 segment. Anterior cerebral arteries grossly patent to their distal aspects. M1 segments grossly perfused bilaterally. Some symmetric flow is seen within the right MCA branches bilaterally, not well assessed due to motion. POSTERIOR CIRCULATION: Visualized distal V4 segments grossly patent to the vertebrobasilar junction. Neither PICA well visualized or evaluated. Basilar grossly patent to its distal aspect. Neither superior cerebellar artery or posterior cerebral artery well seen or evaluated on this motion degraded exam. No obvious aneurysm or other vascular abnormality. IMPRESSION: MRI HEAD IMPRESSION: 1. Technically limited exam due to extensive motion artifact and the patient's inability to tolerate the full length of the study. 2. No evidence for acute ischemic infarct. Evidence for facilitated diffusion at the left cerebellum, corresponding with abnormality on prior CT, suspected to reflect changes of a subacute/recent infarct. 3. No other definite acute intracranial abnormality. MRA HEAD IMPRESSION: 1. Technically limited exam due to extensive motion artifact. 2. Gross patency of the major intracranial arterial vasculature. Evaluation for focal stenoses fairly limited on this exam. Electronically Signed   By: Jeannine Boga M.D.   On: 09/24/2020 21:28   US Carotid Bilateral (at [facility] and [facility] only)  Result Date: 09/25/2020 CLINICAL DATA:  87 year old female with a history of stroke EXAM: BILATERAL CAROTID DUPLEX ULTRASOUND TECHNIQUE: Pearline Cables scale imaging, color Doppler and duplex ultrasound were performed of bilateral carotid and vertebral arteries in the neck. COMPARISON:  None. FINDINGS: Criteria:  Quantification of carotid stenosis is based on velocity parameters that correlate the residual internal carotid diameter with NASCET-based stenosis levels, using the diameter of the distal internal carotid lumen as the denominator for stenosis measurement. The following velocity measurements were obtained: RIGHT ICA:  Systolic 680 cm/sec, Diastolic 19 cm/sec CCA:  54 cm/sec SYSTOLIC ICA/CCA RATIO:  1.2 ECA:  63 cm/sec LEFT ICA:  Systolic 45 cm/sec, Diastolic 13 cm/sec CCA:  50 cm/sec SYSTOLIC ICA/CCA RATIO:  0.9 ECA:  62 cm/sec Right Brachial SBP: Not acquired Left Brachial SBP: Not acquired RIGHT CAROTID ARTERY: No significant calcifications of the right common carotid artery. Intermediate waveform maintained. Heterogeneous and partially calcified plaque at the right carotid bifurcation. No significant lumen shadowing. Low resistance waveform of the right ICA. No significant tortuosity. RIGHT VERTEBRAL ARTERY: Antegrade flow with low resistance waveform. LEFT CAROTID ARTERY: No significant calcifications of the left common carotid artery. Intermediate waveform maintained. Heterogeneous and partially calcified plaque at the left carotid bifurcation without significant lumen shadowing. Low resistance waveform of the left ICA. No significant tortuosity. LEFT VERTEBRAL ARTERY:  Antegrade flow with low resistance waveform. IMPRESSION: Color duplex indicates minimal heterogeneous and calcified plaque, with no hemodynamically significant stenosis by duplex criteria in the extracranial cerebrovascular circulation. Signed, Dulcy Fanny. Dellia Nims, RPVI [facility] Electronically Signed   By: Corrie Mckusick D.O.   On: 09/25/2020 10:11   DG Femur Min 2 Views Left  Result Date: 09/24/2020 CLINICAL DATA:  Fall EXAM: LEFT FEMUR 2 VIEWS COMPARISON:  None. FINDINGS: No acute bony abnormality. Specifically, no fracture, subluxation, or dislocation. Joint space narrowing  within the knee joint. No joint effusion. Vascular calcifications noted. IMPRESSION: No acute bony abnormality. Electronically Signed   By: Rolm Baptise M.D.   On: 09/24/2020 18:29   DG Femur Min 2 Views Right  Result Date: 09/24/2020 CLINICAL DATA:  Fall EXAM: RIGHT FEMUR 2 VIEWS COMPARISON:  None. FINDINGS: No acute bony abnormality. Specifically, no fracture, subluxation, or dislocation. Mild degenerative changes in the right hip joint with joint space narrowing and spurring. Vascular calcifications. IMPRESSION: No acute bony abnormality. Electronically Signed   By: Rolm Baptise M.D.   On: 09/24/2020 18:29    Scheduled Meds:  aspirin  325 mg Oral Daily   atorvastatin  40 mg Oral Daily   levothyroxine  50 mcg Oral Q0600    Continuous Infusions:  sodium chloride 75 mL/hr at 09/25/20 1422   cefTRIAXone (ROCEPHIN)  IV Stopped (09/25/20 0147)     LOS: 0 days     Kayleen Memos, MD [facility] Pager [phone]  If 7PM-7AM, please contact night-coverage [email or web address] Password [email or web address] 09/25/2020, 3:02 PM

## 2020-09-25 NOTE — Evaluation (Signed)
Clinical/Bedside Swallow Evaluation Patient Details  Name: Alexandra Martinez MRN: [id number] Date of Birth: 20-Jul-1929  Today's Date: 09/25/2020 Time: SLP Start Time (ACUTE ONLY): 63 SLP Stop Time (ACUTE ONLY): 6389 SLP Time Calculation (min) (ACUTE ONLY): 42.97 min  Past Medical History:  Past Medical History:  Diagnosis Date   Anemia    r/t uterine fibroids   Atrial fib/flutter, transient    [facility] admission for RVR 03/2009   DDD (degenerative disc disease)    Dysrhythmia    A-FIB   GERD (gastroesophageal reflux disease)    H/O bladder infections    H/O hiatal hernia    H/O thyroid nodule    surgically removed   Heart murmur    moderate MR/TR 08/2013 echo [name])   Hemorrhoids    Hiatal hernia with gastroesophageal reflux 2008   by EGD,  [name]   High cholesterol    History of uterine fibroid    Hydronephrosis    uretero- with congenital UPJ obstruction- Dr. Edrick Oh   Hydronephrosis with ureteropelvic junction obstruction    Hypertension    [facility]   Hypothyroidism    Nocturia    Osteoporosis    Polyuria    Recurrent UTI    Past Surgical History:  Past Surgical History:  Procedure Laterality Date   ADENOIDECTOMY N/A 01/05/2016   Procedure: ADENOIDECTOMY;  Surgeon: Margaretha Sheffield, MD;  Location: [facility] ORS;  Service: ENT;  Laterality: N/A;   BACK SURGERY     CATARACT EXTRACTION     left   CERVICAL [place] SURGERY  1980's   CHOLECYSTECTOMY     NASOPHARYNGOSCOPY  01/05/2016   Procedure: NASOPHARYNGOSCOPY;  Surgeon: Margaretha Sheffield, MD;  Location: [facility] ORS;  Service: ENT;;   THYROID LOBECTOMY     partial, right lobe   TOTAL ABDOMINAL HYSTERECTOMY W/ BILATERAL SALPINGOOPHORECTOMY     HPI:  Per admitting H&P "Alexandra Martinez is a 87 y.o. female with medical history significant of  Dementia, atrial fibrillation, GERD, HLD, Hypothyroidism, HTN        Presented with  Fall at memory unit hitting her head no LOC  Family states she wanders at night   She has hx of  a.fib and was on Xarelto up to 1 m ago but was taken off due tot repeated falls and head injuries   patietn unable to provide much history due to dementia"   Assessment / Plan / Recommendation Clinical Impression  Pt presents with mild dysphagia but no overt s/s of aspiration with any tested consistency. Pt was sleeping upon ST entering but awakened easily for assessment. Oral motor strenth and ROM were assessed during oral phase of swallow as Pt had difficulty following directions for oral mech exam. pt toelrated thinliquids, purees and solids with no overt s/s of aspiration. Oral tranist delay with solids and Pt also presented with very small mouth opening for the boluses. Vocal quality remained clear and laryngeal elevation appeared adequate. Pt has dementia and was not able to feed herself without max cues and assist. Rec Dys 2 chopped diet with addition of some softer smoother foods like pudding to be added to all trays. Rec crushable meds be crushed and goven in applesauce. St to follow up with toelration of diet. may need to downgrade to Dys 2 if Pt fatigues during meals. Staff to assist and feed as needed. SLP Visit Diagnosis: Dysphagia, oral phase (R13.11)    Aspiration Risk  Mild aspiration risk    Diet Recommendation Dysphagia 2 (Fine  chop);Thin liquid   Liquid Administration via: Straw;Cup Medication Administration: Crushed with puree Supervision: Full supervision/cueing for compensatory strategies;Staff to assist with self feeding Compensations: Minimize environmental distractions;Small sips/bites Postural Changes: Seated upright at 90 degrees    Other  Recommendations Oral Care Recommendations: Oral care BID   Follow up Recommendations Skilled Nursing facility      Frequency and Duration min 1 x/week  Other (Comment) (f/u x1)       Prognosis Prognosis for Safe Diet Advancement: Fair      Swallow Study   General Date of Onset: 09/24/20 HPI: Per admitting H&P "Alexandra Martinez is a 87 y.o. female with medical history significant of  Dementia, atrial fibrillation, GERD, HLD, Hypothyroidism, HTN        Presented with  Fall at memory unit hitting her head no LOC  Family states she wanders at night   She has hx of a.fib and was on Xarelto up to 1 m ago but was taken off due tot repeated falls and head injuries   patietn unable to provide much history due to dementia" Type of Study: Bedside Swallow Evaluation Diet Prior to this Study: NPO Temperature Spikes Noted: No Respiratory Status: Room air History of Recent Intubation: No Behavior/Cognition: Cooperative;Pleasant mood;Confused;Doesn't follow directions;Requires cueing Oral Cavity Assessment: Within Functional Limits Oral Care Completed by SLP: No Oral Cavity - Dentition: Adequate natural dentition Self-Feeding Abilities: Total assist;Needs set up;Needs assist Patient Positioning: Upright in bed Baseline Vocal Quality: Normal    Oral/Motor/Sensory Function Overall Oral Motor/Sensory Function: Mild impairment Facial ROM: Within Functional Limits Facial Symmetry: Within Functional Limits Lingual ROM: Within Functional Limits Lingual Symmetry: Within Functional Limits Lingual Strength: Within Functional Limits Mandible: Within Functional Limits   Ice Chips Ice chips: Within functional limits Presentation: Spoon   Thin Liquid Thin Liquid: Within functional limits Presentation: Cup;Spoon;Straw    Nectar Thick Nectar Thick Liquid: Not tested   Honey Thick     Puree Puree: Within functional limits   Solid     Solid: Impaired Presentation: Self Fed Oral Phase Impairments: Impaired mastication Oral Phase Functional Implications: Prolonged oral transit      Lucila Maine 09/25/2020,11:56 AM

## 2020-09-25 NOTE — Consult Note (Signed)
Neurology Consultation Reason for Consult: Falls Referring Physician: Aileen Fass  CC: Falls  History is obtained from: Niece  HPI: Alexandra Martinez is a 87 y.o. female with a history of atrial fibrillation as well as fairly severe dementia who presents with a fall.  CT demonstrated an area of hypodensity in the cerebellum which was concerning for acute infarct and therefore she was admitted.  MRI did not confirm this as an acute stroke, but does show some ?facilitated diffusion which could be consistent with a subacute infarct per the radiologist.  Speaking with her niece, she was taken off of her anticoagulant due to falls, but she has only fallen a few times in the past few months.  The patient is too demented to contribute much to the conversation.  LKW: Unclear tpa given?: no, unclear time of onset   ROS:  Unable to obtain due to altered mental status.   Past Medical History:  Diagnosis Date   Anemia    r/t uterine fibroids   Atrial fib/flutter, transient    [facility] admission for RVR 03/2009   DDD (degenerative disc disease)    Dysrhythmia    A-FIB   GERD (gastroesophageal reflux disease)    H/O bladder infections    H/O hiatal hernia    H/O thyroid nodule    surgically removed   Heart murmur    moderate MR/TR 08/2013 echo [name])   Hemorrhoids    Hiatal hernia with gastroesophageal reflux 2008   by EGD,  [name]   High cholesterol    History of uterine fibroid    Hydronephrosis    uretero- with congenital UPJ obstruction- Dr. Edrick Oh   Hydronephrosis with ureteropelvic junction obstruction    Hypertension    [facility]   Hypothyroidism    Nocturia    Osteoporosis    Polyuria    Recurrent UTI      Family History  Problem Relation Age of Onset   Heart disease Mother    Coronary artery disease Father        MI's     Social History:  reports that she has never smoked. She has never used smokeless tobacco. She reports that she does not drink alcohol  and does not use drugs.   Exam: Current vital signs: BP (!) 164/94 (BP Location: Left Arm)   Pulse 76   Temp 98.1 F (36.7 C) (Oral)   Resp 14   Ht 5' (1.524 m)   Wt 59.3 kg   SpO2 91%   BMI 25.53 kg/m  Vital signs in last 24 hours: Temp:  [97.6 F (36.4 C)-98.1 F (36.7 C)] 98.1 F (36.7 C) (06/09 0752) Pulse Rate:  [71-89] 76 (06/09 0752) Resp:  [14-18] 14 (06/09 0752) BP: (125-169)/(51-94) 164/94 (06/09 0752) SpO2:  [91 %-100 %] 91 % (06/09 0752) Weight:  [59.3 kg-61 kg] 59.3 kg (06/09 0047)   Physical Exam  Constitutional: Appears elderly Psych: Calm and pleasant Eyes: No scleral injection HENT: No OP obstruction MSK: no joint deformities.  Cardiovascular: Normal rate and regular rhythm.  Respiratory: Effort normal, non-labored breathing GI: Soft.  No distension. There is no tenderness.  Skin: WDI  Neuro: Mental Status: Patient is awake, alert, oriented to person only gives the year is "59" and the location as "we have a place to eat over there" as she gestures to the right but will not further delineate. cranial Nerves: II: Visual Fields are full. Pupils are equal, round, and reactive to light.  III,IV, VI: EOMI without ptosis or diploplia.  She has a saccadic smooth pursuit, but no nystagmus as can be seen with an acute cerebellar stroke V: Facial sensation is symmetric to temperature VII: Facial movement is symmetric.  VIII: hearing is intact to voice X: Uvula elevates symmetrically XI: Shoulder shrug is symmetric. XII: tongue is midline without atrophy or fasciculations.  Motor: Tone is normal. Bulk is normal. 5/5 strength was present in all four extremities.  Sensory: Sensation is symmetric to light touch and temperature in the arms and legs.Cerebellar: FNF is actually relatively symmetric with some intentional tremor bilaterally, she has a lot of trouble understanding what I am asking her to do for heel-knee-shin      I have reviewed labs in epic  and the results pertinent to this consultation are: B12 3700 LDL 116  I have reviewed the images obtained: CT head-hypodensity in the left cerebellum  Impression: 87 year old female with left cerebellar lesion of unclear duration or etiology.  I think that subacute/early chronic ischemic stroke is certainly possible and if such is the case, likely embolic because of atrial fibrillation not on anticoagulation.  Given that there is no longer restricted, I do think we could restart anticoagulation.  I would favor trying to repeat her MRI with FLAIR and gradient prior to this.  I discussed risks of anticoagulation including intracranial hemorrhage if she falls, however given that she is only fallen twice, and appears to have thrown a clot I think that I would favor anticoagulation unless she is truly unsteady with physical therapy.  Recommendations: 1) PT, OT, ST 2) repeat MRI for gradient/FLAIR 3) ASA/statin 4) neurology will follow   Roland Rack, MD [facility] [phone]  If 7pm- 7am, please page neurology on call as listed in [employer].

## 2020-09-26 DIAGNOSIS — I10 Essential (primary) hypertension: Secondary | ICD-10-CM | POA: Diagnosis present

## 2020-09-26 DIAGNOSIS — I614 Nontraumatic intracerebral hemorrhage in cerebellum: Secondary | ICD-10-CM | POA: Diagnosis not present

## 2020-09-26 DIAGNOSIS — E785 Hyperlipidemia, unspecified: Secondary | ICD-10-CM | POA: Diagnosis present

## 2020-09-26 DIAGNOSIS — D696 Thrombocytopenia, unspecified: Secondary | ICD-10-CM | POA: Diagnosis present

## 2020-09-26 DIAGNOSIS — Z7189 Other specified counseling: Secondary | ICD-10-CM | POA: Diagnosis not present

## 2020-09-26 DIAGNOSIS — Z515 Encounter for palliative care: Secondary | ICD-10-CM | POA: Diagnosis not present

## 2020-09-26 DIAGNOSIS — N390 Urinary tract infection, site not specified: Secondary | ICD-10-CM | POA: Diagnosis not present

## 2020-09-26 DIAGNOSIS — G9341 Metabolic encephalopathy: Secondary | ICD-10-CM | POA: Diagnosis present

## 2020-09-26 DIAGNOSIS — I4821 Permanent atrial fibrillation: Secondary | ICD-10-CM | POA: Diagnosis present

## 2020-09-26 DIAGNOSIS — Z79890 Hormone replacement therapy (postmenopausal): Secondary | ICD-10-CM | POA: Diagnosis not present

## 2020-09-26 DIAGNOSIS — Y92129 Unspecified place in nursing home as the place of occurrence of the external cause: Secondary | ICD-10-CM | POA: Diagnosis not present

## 2020-09-26 DIAGNOSIS — I4892 Unspecified atrial flutter: Secondary | ICD-10-CM | POA: Diagnosis present

## 2020-09-26 DIAGNOSIS — I482 Chronic atrial fibrillation, unspecified: Secondary | ICD-10-CM | POA: Diagnosis not present

## 2020-09-26 DIAGNOSIS — E039 Hypothyroidism, unspecified: Secondary | ICD-10-CM | POA: Diagnosis present

## 2020-09-26 DIAGNOSIS — Z66 Do not resuscitate: Secondary | ICD-10-CM | POA: Diagnosis present

## 2020-09-26 DIAGNOSIS — D6859 Other primary thrombophilia: Secondary | ICD-10-CM | POA: Diagnosis present

## 2020-09-26 DIAGNOSIS — Z79899 Other long term (current) drug therapy: Secondary | ICD-10-CM | POA: Diagnosis not present

## 2020-09-26 DIAGNOSIS — M810 Age-related osteoporosis without current pathological fracture: Secondary | ICD-10-CM | POA: Diagnosis present

## 2020-09-26 DIAGNOSIS — K219 Gastro-esophageal reflux disease without esophagitis: Secondary | ICD-10-CM | POA: Diagnosis present

## 2020-09-26 DIAGNOSIS — N3000 Acute cystitis without hematuria: Secondary | ICD-10-CM | POA: Diagnosis present

## 2020-09-26 DIAGNOSIS — Z8249 Family history of ischemic heart disease and other diseases of the circulatory system: Secondary | ICD-10-CM | POA: Diagnosis not present

## 2020-09-26 DIAGNOSIS — B9620 Unspecified Escherichia coli [E. coli] as the cause of diseases classified elsewhere: Secondary | ICD-10-CM | POA: Diagnosis present

## 2020-09-26 DIAGNOSIS — F0390 Unspecified dementia without behavioral disturbance: Secondary | ICD-10-CM | POA: Diagnosis present

## 2020-09-26 DIAGNOSIS — Z885 Allergy status to narcotic agent status: Secondary | ICD-10-CM | POA: Diagnosis not present

## 2020-09-26 DIAGNOSIS — I639 Cerebral infarction, unspecified: Secondary | ICD-10-CM | POA: Diagnosis present

## 2020-09-26 DIAGNOSIS — W010XXA Fall on same level from slipping, tripping and stumbling without subsequent striking against object, initial encounter: Secondary | ICD-10-CM | POA: Diagnosis present

## 2020-09-26 DIAGNOSIS — D6869 Other thrombophilia: Secondary | ICD-10-CM | POA: Diagnosis not present

## 2020-09-26 DIAGNOSIS — D509 Iron deficiency anemia, unspecified: Secondary | ICD-10-CM | POA: Diagnosis present

## 2020-09-26 DIAGNOSIS — Z888 Allergy status to other drugs, medicaments and biological substances status: Secondary | ICD-10-CM | POA: Diagnosis not present

## 2020-09-26 DIAGNOSIS — Z20822 Contact with and (suspected) exposure to covid-19: Secondary | ICD-10-CM | POA: Diagnosis present

## 2020-09-26 LAB — BASIC METABOLIC PANEL
Anion gap: 10 (ref 5–15)
BUN: 12 mg/dL (ref 8–23)
CO2: 25 mmol/L (ref 22–32)
Calcium: 8.8 mg/dL — ABNORMAL LOW (ref 8.9–10.3)
Chloride: 103 mmol/L (ref 98–111)
Creatinine, Ser: 0.96 mg/dL (ref 0.44–1.00)
GFR, Estimated: 57 mL/min — ABNORMAL LOW (ref >60)
Glucose, Bld: 106 mg/dL — ABNORMAL HIGH (ref 70–99)
Potassium: 3.3 mmol/L — ABNORMAL LOW (ref 3.5–5.1)
Sodium: 138 mmol/L (ref 135–145)

## 2020-09-26 LAB — CBC
HCT: 32.9 % — ABNORMAL LOW (ref 36.0–46.0)
Hemoglobin: 11.4 g/dL — ABNORMAL LOW (ref 12.0–15.0)
MCH: 32.1 pg (ref 26.0–34.0)
MCHC: 34.7 g/dL (ref 30.0–36.0)
MCV: 92.7 fL (ref 80.0–100.0)
Platelets: 149 10*3/uL — ABNORMAL LOW (ref 150–400)
RBC: 3.55 MIL/uL — ABNORMAL LOW (ref 3.87–5.11)
RDW: 12.2 % (ref 11.5–15.5)
WBC: 7.4 10*3/uL (ref 4.0–10.5)
nRBC: 0 % (ref 0.0–0.2)

## 2020-09-26 LAB — PHOSPHORUS: Phosphorus: 3.5 mg/dL (ref 2.5–4.6)

## 2020-09-26 LAB — HEMOGLOBIN A1C
Hgb A1c MFr Bld: 5.6 % (ref 4.8–5.6)
Mean Plasma Glucose: 114 mg/dL

## 2020-09-26 LAB — MAGNESIUM: Magnesium: 1.8 mg/dL (ref 1.7–2.4)

## 2020-09-26 MED ORDER — LABETALOL HCL 5 MG/ML IV SOLN: 5.0000 mg | INTRAVENOUS | Status: DC | PRN

## 2020-09-26 MED ORDER — METOPROLOL TARTRATE 25 MG PO TABS
12.5000 mg | ORAL_TABLET | Freq: Two times a day (BID) | ORAL | Status: DC
  Administered 2020-09-26: 06:00:00 12.5 mg via ORAL
  Filled 2020-09-26: qty 1

## 2020-09-26 MED ORDER — METOPROLOL TARTRATE 25 MG PO TABS
25.0000 mg | ORAL_TABLET | Freq: Two times a day (BID) | ORAL | Status: DC
  Administered 2020-09-26 – 2020-09-29 (×5): 25 mg via ORAL
  Filled 2020-09-26 (×6): qty 1

## 2020-09-26 MED ORDER — MORPHINE SULFATE (PF) 2 MG/ML IV SOLN: 1.0000 mg | INTRAVENOUS | Status: DC | PRN

## 2020-09-26 MED ORDER — METOPROLOL TARTRATE 25 MG PO TABS
12.5000 mg | ORAL_TABLET | ORAL | Status: AC
  Administered 2020-09-26: 12.5 mg via ORAL

## 2020-09-26 NOTE — Plan of Care (Signed)
Pt oriented to self only, very nonsensical conversations. Pleasantly confused, requires frequent redirection when providing care. Unable to keep telemetry leads in place d/t pt continuously removing leads. NS infusing per MAR, IV ABX given w/o issue. Ambulated to bathroom with x1 assistance and walker. No s/s pain overnight. 1:1 sitter in place for safety d/t wandering at night. Fall/safety precautions in place, rounding performed.   Problem: Education: Goal: Knowledge of General Education information will improve Description: Including pain rating scale, medication(s)/side effects and non-pharmacologic comfort measures Outcome: Not Progressing   Problem: Health Behavior/Discharge Planning: Goal: Ability to manage health-related needs will improve Outcome: Not Progressing   Problem: Clinical Measurements: Goal: Ability to maintain clinical measurements within normal limits will improve Outcome: Progressing Goal: Will remain free from infection Outcome: Progressing Goal: Diagnostic test results will improve Outcome: Progressing   Problem: Activity: Goal: Risk for activity intolerance will decrease Outcome: Progressing   Problem: Elimination: Goal: Will not experience complications related to urinary retention Outcome: Progressing

## 2020-09-26 NOTE — Progress Notes (Addendum)
PROGRESS NOTE  Alexandra Martinez ZTI:[id number] DOB: June 14, 1929 DOA: 09/24/2020 PCP: Crecencio Mc, MD  HPI/Recap of past 24 hours: BRIEONNA CRUTCHER is a 87 y.o. female with medical history significant of  Dementia, permanent atrial fibrillation currently not on [place], GERD, HLD, Hypothyroidism, HTN Presented with Fall at memory unit hitting her head no LOC.  Unclear on last known well.  Family states she wanders at night.  She was on Xarelto up to 1 month prior to her presentation but was taken off due to repeated falls and head injuries.  Work up revealed subacute CVA involving left cerebellum.  Seen by neuro/stroke team.  Further work-up revealed: Regional edema and petechial blood products within the inferior cerebellum on the left without restricted diffusion presently. Most likely diagnosis is subacute infarction with petechial blood products. There is a remote possibility this could be a cerebellar hemorrhagic contusion.  09/26/20: Seen and examined with one-to-one sitter at bedside.  She is more alert and interactive.  She is moving all 4 limbs without difficulty.  DC'd aspirin due to possible cerebellar hemorrhagic contusion.  Defer to neurology to restart antiplatelet.   UPDATE:  Goals of care discussion took place on 09/26/20 with patient's niece and PMT.  Family made decision for comfort care.  Plan is to discharge to hospice home when bed is available.  Appreciate Palliative care team's assistance.    Assessment/Plan: Active Problems:   Controlled atrial fibrillation (HCC)   Hypothyroid   Anemia   Acquired thrombophilia (HCC)   CVA (cerebral vascular accident) ([place])   Acute lower UTI  Subacute left cerebellar CVA (cerebral vascular accident) ([place]) with concern for cerebral hemorrhagic contusion-         MRA/MRI  Resulted - showing likely subacute ischemic CVA, repeated MRI brain revealed findings as stated above     Carotid Doppler unremarkable 2D echo done on 09/25/2020 showing LVEF  55 to 60%, left atrial size was moderately dilated. LDL 116, goal less than 70, continue statin. PT OT no further recommendations. Speech therapist recommendation for dysphagia 2 diet fine chopped with thin liquids. Aspirin, 325 mg DC'd on 09/26/2020 due to concern for hemorrhagic contusion affecting the cerebellum. Continue statin Goal SBP less than 140. Appreciate neuro/stroke team's assistance.   Permanent atrial fibrillation (HCC) -  Home metoprolol restarted, increased rate to 25 mg twice daily. Not on blood thinner due to falls   E. coli UTI, POA. Urine culture done on 09/24/2020 showing greater than 100,000 colonies of E. Coli Continue Rocephin.  Follow sensitivities.  Acute metabolic encephalopathy in the setting of dementia Improving One-to-one sitter for patient's own safety. Reorient as needed Fall/aspiration/delirium precautions.  HLD LDL 116 Goal LDL less than 70 Lipitor 40 mg daily   Chronic normocytic anemia Stable Monitor H&H    Acquired thrombophilia (HCC) Not  on anticoagulation due to falls   Hypothyroid  TSH 1.4  Continue home levothyroxine.        Code Status: DNR, NOW COMFORT CARE per PALLIATIVE CARE TEAM, Asencion Gowda, NP.  Family Communication: None at bedside  Disposition Plan: SNF when neurology signs off.   Consultants: Neurology/stroke team.  Procedures: 2D echo.  Antimicrobials: Rocephin.  DVT prophylaxis: SCDs.  Status is: Observation    Dispo: The patient is from: SNF.               Anticipated d/c is to: SNF when neurology signs off.  Patient currently, not stable for discharge due to ongoing work-up for stroke.    Difficult to place patient, not applicable.        Objective: Vitals:   09/26/20 0500 09/26/20 0600 09/26/20 0757 09/26/20 1149  BP: (!) 156/76  (!) 172/85 (!) 155/86  Pulse: 74  75 74  Resp: 17 19 16 16   Temp: 98.5 F (36.9 C)  98.1 F (36.7 C) 97.9 F (36.6 C)  TempSrc:  Oral     SpO2: 96%  91% 97%  Weight:      Height:        Intake/Output Summary (Last 24 hours) at 09/26/2020 1359 Last data filed at 09/26/2020 0231 Gross per 24 hour  Intake 1402.89 ml  Output --  Net 1402.89 ml   Filed Weights   09/24/20 1726 09/25/20 0047  Weight: 61 kg 59.3 kg    Exam:  General: 87 y.o. year-old female frail-appearing in no acute distress.  She is alert and interactive.   Cardiovascular: Irregular rate and rhythm no rubs or gallops.   Respiratory: Clear to auscultation no wheezes no rales.   Abdomen: Soft nontender normal bowel sounds present. Musculoskeletal: No lower extremity edema bilaterally.   Skin: No ulcerative lesions noted.   Psychiatry: Mood is appropriate for condition and setting.  Data Reviewed: CBC: Recent Labs  Lab 09/24/20 1840 09/26/20 0554  WBC 6.4 7.4  NEUTROABS 4.2  --   HGB 10.8* 11.4*  HCT 32.6* 32.9*  MCV 93.1 92.7  PLT 143* 850*   Basic Metabolic Panel: Recent Labs  Lab 09/24/20 1840 09/26/20 0554  NA 139 138  K 3.8 3.3*  CL 102 103  CO2 27 25  GLUCOSE 97 106*  BUN 18 12  CREATININE 1.19* 0.96  CALCIUM 8.9 8.8*  MG  --  1.8  PHOS  --  3.5   GFR: Estimated Creatinine Clearance: 32.6 mL/min (by C-G formula based on SCr of 0.96 mg/dL). Liver Function Tests: Recent Labs  Lab 09/24/20 1840  AST 18  ALT 9  ALKPHOS 59  BILITOT 0.7  PROT 6.3*  ALBUMIN 3.7   No results for input(s): LIPASE, AMYLASE in the last 168 hours. No results for input(s): AMMONIA in the last 168 hours. Coagulation Profile: No results for input(s): INR, PROTIME in the last 168 hours. Cardiac Enzymes: No results for input(s): CKTOTAL, CKMB, CKMBINDEX, TROPONINI in the last 168 hours. BNP (last 3 results) No results for input(s): PROBNP in the last 8760 hours. HbA1C: Recent Labs    09/25/20 0401  HGBA1C 5.6   CBG: No results for input(s): GLUCAP in the last 168 hours. Lipid Profile: Recent Labs    09/25/20 0401  CHOL 170   HDL 34*  LDLCALC 116*  TRIG 98  CHOLHDL 5.0   Thyroid Function Tests: Recent Labs    09/25/20 0401  TSH 1.455   Anemia Panel: Recent Labs    09/25/20 0401  VITAMINB12 3,781*  FOLATE 7.1  FERRITIN 64  TIBC 283  IRON 46  RETICCTPCT 1.4   Urine analysis:    Component Value Date/Time   COLORURINE YELLOW (A) 09/24/2020 1856   APPEARANCEUR CLOUDY (A) 09/24/2020 1856   APPEARANCEUR Cloudy 02/05/2014 1340   LABSPEC 1.008 09/24/2020 1856   LABSPEC 1.014 02/05/2014 1340   PHURINE 6.0 09/24/2020 1856   GLUCOSEU NEGATIVE 09/24/2020 1856   GLUCOSEU NEGATIVE 09/19/2018 1407   HGBUR SMALL (A) 09/24/2020 1856   BILIRUBINUR NEGATIVE 09/24/2020 1856   BILIRUBINUR neg 01/02/2015 1420  BILIRUBINUR Negative 02/05/2014 1340   KETONESUR NEGATIVE 09/24/2020 1856   PROTEINUR NEGATIVE 09/24/2020 1856   UROBILINOGEN 0.2 09/19/2018 1407   NITRITE POSITIVE (A) 09/24/2020 1856   LEUKOCYTESUR LARGE (A) 09/24/2020 1856   LEUKOCYTESUR 3+ 02/05/2014 1340   Sepsis Labs: @LABRCNTIP (procalcitonin:4,lacticidven:4)  ) Recent Results (from the past 240 hour(s))  Urine Culture     Status: Abnormal (Preliminary result)   Collection Time: 09/24/20  6:56 PM   Specimen: Urine, Random  Result Value Ref Range Status   Specimen Description   Final    URINE, RANDOM Performed at [facility], [address], [place], [place] [address]    Special Requests   Final    NONE Performed at [facility], [address], [place], [place] [id number]    Culture >=100,000 COLONIES/mL ESCHERICHIA COLI (A)  Final   Report Status PENDING  Incomplete  SARS CORONAVIRUS 2 (TAT 6-24 HRS) Nasopharyngeal Nasopharyngeal Swab     Status: None   Collection Time: 09/24/20 10:42 PM   Specimen: Nasopharyngeal Swab  Result Value Ref Range Status   SARS Coronavirus 2 NEGATIVE NEGATIVE Final    Comment: (NOTE) SARS-CoV-2 target nucleic acids are NOT DETECTED.  The SARS-CoV-2 RNA is generally detectable  in upper and lower respiratory specimens during the acute phase of infection. Negative results do not preclude SARS-CoV-2 infection, do not rule out co-infections with other pathogens, and should not be used as the sole basis for treatment or other patient management decisions. Negative results must be combined with clinical observations, patient history, and epidemiological information. The expected result is Negative.  Fact Sheet for Patients: SugarRoll.be  Fact Sheet for Healthcare Providers: https://www.woods-mathews.com/  This test is not yet approved or cleared by the [place] FDA and  has been authorized for detection and/or diagnosis of SARS-CoV-2 by FDA under an Emergency Use Authorization (EUA). This EUA will remain  in effect (meaning this test can be used) for the duration of the COVID-19 declaration under Se ction 564(b)(1) of the Act, 21 U.S.C. section 360bbb-3(b)(1), unless the authorization is terminated or revoked sooner.  Performed at [facility], [place] [address], [place], [place] [address]       Studies: MR BRAIN WO CONTRAST  Result Date: 09/25/2020 CLINICAL DATA:  Atrial fibrillation and dementia.  Recent fall. EXAM: MRI HEAD WITHOUT CONTRAST TECHNIQUE: Multiplanar, multiecho pulse sequences of the brain and surrounding structures were obtained without intravenous contrast. COMPARISON:  MRI study in CT study done yesterday. FINDINGS: Brain: Abnormal edema and petechial blood products throughout the inferior cerebellum on the left. The differential diagnosis for this is subacute infarction without residual restricted diffusion versus is cerebellar contusion. No other acute intracranial finding. Atrophy and chronic small-vessel ischemic changes of the cerebral hemispheric white matter seen otherwise. No hydrocephalus. No extra-axial fluid collection. Generalized atrophy with temporal lobe predominance. Vascular: Major  vessels at the base of the brain show flow. Skull and upper cervical spine: Negative Sinuses/Orbits: Clear/normal Other: None IMPRESSION: Regional edema and petechial blood products within the inferior cerebellum on the left without restricted diffusion presently. Most likely diagnosis is subacute infarction with petechial blood products. There is a remote possibility this could be a cerebellar hemorrhagic contusion, but I think that is considerably less likely Atrophy and chronic small-vessel ischemic changes otherwise. Electronically Signed   By: Nelson Chimes M.D.   On: 09/25/2020 18:44    Scheduled Meds:  atorvastatin  40 mg Oral Daily   levothyroxine  50 mcg  Oral Q0600   metoprolol tartrate  12.5 mg Oral BID    Continuous Infusions:  sodium chloride 75 mL/hr at 09/25/20 1422   cefTRIAXone (ROCEPHIN)  IV Stopped (09/25/20 2209)     LOS: 0 days     Kayleen Memos, MD [facility] Pager [phone]  If 7PM-7AM, please contact night-coverage [email or web address] Password [facility] 09/26/2020, 1:59 PM

## 2020-09-26 NOTE — Progress Notes (Signed)
Subjective: No changes  Exam: Vitals:   09/26/20 0600 09/26/20 0757  BP:  (!) 172/85  Pulse:  75  Resp: 19 16  Temp:  98.1 F (36.7 C)  SpO2:  91%   Gen: In bed, NAD Resp: non-labored breathing, no acute distress Abd: soft, nt  Neuro: MS: awake, alert, oriented to name only.  CN: VFF, eomi Motor: MAEW Sensory:responds to mild stim bilaterally  Pertinent Labs: Cr 0.96  Impression: 87 yo F with subacute cerebellar stroke. With petechial blood products and unclear onset, I would favor waiting another week prior to starting her back on anticoagulation. I discussed the tenuous nature of the decision with her POA, [name], and though there is risk with anticoagulation, I think that the benefits outweigh the risk and therefore recommend starting it back and she agreed.   Recommendations: 1) Restart anticoagulation in one week.  2) No further recommendations at this time, please call with further questions.   Roland Rack, MD [facility] [phone]  If 7pm- 7am, please page neurology on call as listed in [place].

## 2020-09-26 NOTE — Consult Note (Addendum)
Consultation Note Date: 09/26/2020   Patient Name: Alexandra Martinez  DOB: March 13, 1930  MRN: [id number]  Age / Sex: 87 y.o., female  PCP: Crecencio Mc, MD Referring Physician: Kayleen Memos, DO  Reason for Consultation: Establishing goals of care  HPI/Patient Profile: Alexandra Martinez is a 87 y.o. female with medical history significant of  Dementia, atrial fibrillation, GERD, HLD, Hypothyroidism, HTN. Presented with  Fall at memory unit hitting her head no LOC  Clinical Assessment and Goals of Care: Patient is resting in bed at this time with eyes closed.  She has a Actuary at bedside.  Called her emergency contact, which is her niece Alexandra Martinez.  [name] states that patient is widowed, and that both of her kids died at the young ages of 67 months and 85 years old.  She states that she is patient's next of kin.   She states patient moved into assisted living in 2015.  She moved into memory care a year ago due to wandering at night.  She states at baseline she uses a walker and is able to do ADLs. Daughter states she has not been eating well and has been declining.   We discussed her diagnosis, prognosis, GOC, EOL wishes disposition and options.  A detailed discussion was had today regarding advanced directives.  Concepts specific to code status, artifical feeding and hydration, IV antibiotics and rehospitalization were discussed.  The difference between an aggressive medical intervention path and a comfort care path was discussed.  Values and goals of care important to patient and family were attempted to be elicited.  Discussed limitations of medical interventions to prolong quality of life in some situations and discussed the concept of human mortality.  She states she understands her aunts situation, and is able to articulate accurately her aunt's health condition.  She understands that she has been very  lethargic, and has very poor oral intake.  She states that she would just want to focus on [name]'s comfort and dignity for the time that she has left on this earth.  She states that she wants no further life-prolonging interventions.  She confirms DNR status, and states that she would never want intubation and would never want a feeding tube.  She would like for her to return to her facility with hospice to follow.  ADDENDUM: Was advised by staff there is difficulty with getting her back to her facility. Niece and daughter were on phone together. Discussed her status and different scenarios. Spoke with niece regarding hospice facility placement and she is amenable. She would like to stop antibiotic therapy and life prolonging interventions.   I completed a MOST form today with patient through [place] [facility] with niece and niece's daughter. A photocopy was placed in the chart to be scanned into EMR. The patient outlined their wishes for the following treatment decisions:  Cardiopulmonary Resuscitation: Do Not Attempt Resuscitation (DNR/No CPR)  Medical Interventions: Comfort Measures: Keep clean, warm, and dry. Use medication by any route, positioning, wound care, and other measures  to relieve pain and suffering. Use oxygen, suction and manual treatment of airway obstruction as needed for comfort. Do not transfer to the hospital unless comfort needs cannot be met in current location.  Antibiotics: No antibiotics (use other measures to relieve symptoms)  IV Fluids: No IV fluids (provide other measures to ensure comfort)  Feeding Tube: No feeding tube     SUMMARY OF RECOMMENDATIONS   Return to her facility with hospice to follow.  Given her poor p.o. intake and her lethargy, and UTI, prognosis is < 2 weeks.   ADDENDUM: Spoke with niece and discussed information that TOC is having difficulty with returning patient to her facility. They would like hospice facility placement.        Primary Diagnoses: Present  on Admission:  CVA (cerebral vascular accident) ([place])  Controlled atrial fibrillation ([place])  Acute lower UTI  Anemia  Acquired thrombophilia ([place])  Hypothyroid   I have reviewed the medical record, interviewed the patient and family, and examined the patient. The following aspects are pertinent.  Past Medical History:  Diagnosis Date   Anemia    r/t uterine fibroids   Atrial fib/flutter, transient    [facility] admission for RVR 03/2009   DDD (degenerative disc disease)    Dysrhythmia    A-FIB   GERD (gastroesophageal reflux disease)    H/O bladder infections    H/O hiatal hernia    H/O thyroid nodule    surgically removed   Heart murmur    moderate MR/TR 08/2013 echo [name])   Hemorrhoids    Hiatal hernia with gastroesophageal reflux 2008   by EGD,  [name]   High cholesterol    History of uterine fibroid    Hydronephrosis    uretero- with congenital UPJ obstruction- Dr. Edrick Oh   Hydronephrosis with ureteropelvic junction obstruction    Hypertension    [facility]   Hypothyroidism    Nocturia    Osteoporosis    Polyuria    Recurrent UTI    Social History   Socioeconomic History   Marital status: Widowed    Spouse name: Not on file   Number of children: Not on file   Years of education: Not on file   Highest education level: Not on file  Occupational History   Not on file  Tobacco Use   Smoking status: Never   Smokeless tobacco: Never  Vaping Use   Vaping Use: Never used  Substance and Sexual Activity   Alcohol use: No   Drug use: No   Sexual activity: Never  Other Topics Concern   Not on file  Social History Narrative   [facility] care at [facility] widowed in 2005.   Both son's have passed away   12th grade education   Retired Engineer, manufacturing systems   Social Determinants of Radio broadcast assistant Strain: Medium Risk   Difficulty of Paying Living Expenses: Somewhat hard  Food Insecurity: Not on file  Transportation Needs: Not on file   Physical Activity: Not on file  Stress: Not on file  Social Connections: Not on file   Family History  Problem Relation Age of Onset   Heart disease Mother    Coronary artery disease Father        MI's   Scheduled Meds:  atorvastatin  40 mg Oral Daily   levothyroxine  50 mcg Oral Q0600   metoprolol tartrate  12.5 mg Oral BID   Continuous Infusions:  sodium chloride 75 mL/hr at 09/25/20 1422  cefTRIAXone (ROCEPHIN)  IV Stopped (09/25/20 2209)   PRN Meds:.acetaminophen **OR** acetaminophen (TYLENOL) oral liquid 160 mg/5 mL **OR** acetaminophen, labetalol Medications Prior to Admission:  Prior to Admission medications   Medication Sig Start Date End Date Taking? Authorizing Provider  acetaminophen (TYLENOL) 500 MG tablet Take 2 tablets (1,000 mg total) by mouth every 12 (twelve) hours. 07/17/19  Yes Crecencio Mc, MD  Cholecalciferol 250 MCG (10000 UT) CAPS Take by mouth.   Yes [provider]  levothyroxine (SYNTHROID) 50 MCG tablet TAKE ONE TABLET EVERY DAY 04/26/19  Yes Crecencio Mc, MD  losartan (COZAAR) 100 MG tablet Take 1 tablet (100 mg total) by mouth daily. 06/09/20  Yes Crecencio Mc, MD  memantine (NAMENDA) 10 MG tablet Take 1 tablet every night for 2 weeks, then increase to 1 tab twice a day and continue 02/21/20  Yes Cameron Sprang, MD  metoprolol tartrate (LOPRESSOR) 50 MG tablet Take 50 mg by mouth 2 (two) times daily.   Yes [provider]  pantoprazole (PROTONIX) 40 MG tablet Take 1 tablet (40 mg total) by mouth in the morning and at bedtime. 10/18/19  Yes Crecencio Mc, MD  PARoxetine (PAXIL) 10 MG tablet Take 1 tablet (10 mg total) by mouth daily. 08/29/19  Yes Crecencio Mc, MD  vitamin B-12 (CYANOCOBALAMIN) 1000 MCG tablet Take 1 tablet (1,000 mcg total) by mouth daily. 04/02/20  Yes Crecencio Mc, MD  ALPRAZolam [name]) 0.25 MG tablet Take 1 tablet (0.25 mg total) by mouth at bedtime as needed for anxiety or sleep. 07/17/20   Crecencio Mc, MD  alum & mag hydroxide-simeth (MAALOX PLUS) 400-400-40 MG/5ML suspension Take 10 mLs by mouth every 8 (eight) hours as needed for indigestion. Patient not taking: Reported on 09/25/2020 11/01/19   Crecencio Mc, MD  mirtazapine (REMERON) 15 MG tablet Take 1 tablet (15 mg total) by mouth at bedtime. Patient not taking: Reported on 09/25/2020 08/29/19   Crecencio Mc, MD  ondansetron (ZOFRAN ODT) 4 MG disintegrating tablet Take 1 tablet (4 mg total) by mouth every 8 (eight) hours as needed for nausea or vomiting. 11/01/19   Crecencio Mc, MD  ondansetron (ZOFRAN) 4 MG tablet Take by mouth. Patient not taking: Reported on 09/25/2020    [provider]  rivastigmine (EXELON) 3 MG capsule Take 3 mg by mouth at bedtime. Patient not taking: Reported on 09/25/2020 02/21/20   [provider]  Syringe/Needle, Disp, (SYRINGE 3CC/23GX1") 23G X 1" 3 ML MISC Use as directed With B-12 Injections 04/02/20   Crecencio Mc, MD  XARELTO 15 MG TABS tablet Take 15 mg by mouth daily. Patient not taking: Reported on 09/25/2020 03/03/19   [provider]   Allergies  Allergen Reactions   Celebrex [Celecoxib] Other (See Comments)   Donepezil Other (See Comments)    Stomach pains   Loratadine-Pseudoephedrine Er     Other reaction(s): Unknown   Venlafaxine Other (See Comments)   Vioxx [Rofecoxib] Other (See Comments)   Codeine Itching and Rash   Dexilant [Dexlansoprazole] Rash   Eliquis [Apixaban] Rash   Review of Systems  Unable to perform ROS  Physical Exam Constitutional:      Comments: Eyes closed.  Respirations even and unlabored.  Sitter at bedside.    Vital Signs: BP (!) 155/86 (BP Location: Left Arm)   Pulse 74   Temp 97.9 F (36.6 C)   Resp 16   Ht 5' (1.524 m)  Wt 59.3 kg   SpO2 97%   BMI 25.53 kg/m  Pain Scale: Faces       SpO2: SpO2: 97 % O2 Device:SpO2: 97 % O2 Flow Rate: .   IO: Intake/output summary:  Intake/Output Summary (Last 24 hours) at  09/26/2020 1339 Last data filed at 09/26/2020 0231 Gross per 24 hour  Intake 1402.89 ml  Output --  Net 1402.89 ml    LBM: Last BM Date:  (UTA d/t AMS) Baseline Weight: Weight: 61 kg Most recent weight: Weight: 59.3 kg       Time In: 1:15 Time Out: 1:45 Time Total: 30 min Greater than 50%  of this time was spent counseling and coordinating care related to the above assessment and plan.  Signed by: Asencion Gowda, NP   Please contact Palliative Medicine Team phone at [phone] for questions and concerns.  For individual provider: See Shea Evans

## 2020-09-26 NOTE — TOC Progression Note (Signed)
Transition of Care [facility]) - Progression Note    Patient Details  Name: Alexandra Martinez MRN: [id number] Date of Birth: May 08, 1929  Transition of Care [facility]) CM/SW [place], RN Phone Number: 09/26/2020, 5:13 PM  Clinical Narrative:   Alexandra Martinez states she can return only after an evaluation by their staff.  Also refer to palliative  consult on possibility of hospice house.  Will advise [facility] of plan.    Expected Discharge Plan: Memory Care    Expected Discharge Plan and Services Expected Discharge Plan: Memory Care   Discharge Planning Services: CM Consult Post Acute Care Choice: NA Living arrangements for the past 2 months: Skilled Nursing Facility                                       Social Determinants of Health (SDOH) Interventions    Readmission Risk Interventions No flowsheet data found.

## 2020-09-27 DIAGNOSIS — Z8679 Personal history of other diseases of the circulatory system: Secondary | ICD-10-CM

## 2020-09-27 DIAGNOSIS — B9620 Unspecified Escherichia coli [E. coli] as the cause of diseases classified elsewhere: Secondary | ICD-10-CM

## 2020-09-27 DIAGNOSIS — E785 Hyperlipidemia, unspecified: Secondary | ICD-10-CM

## 2020-09-27 DIAGNOSIS — G9341 Metabolic encephalopathy: Secondary | ICD-10-CM

## 2020-09-27 DIAGNOSIS — D6869 Other thrombophilia: Secondary | ICD-10-CM

## 2020-09-27 DIAGNOSIS — N390 Urinary tract infection, site not specified: Secondary | ICD-10-CM

## 2020-09-27 DIAGNOSIS — I482 Chronic atrial fibrillation, unspecified: Secondary | ICD-10-CM

## 2020-09-27 DIAGNOSIS — E039 Hypothyroidism, unspecified: Secondary | ICD-10-CM

## 2020-09-27 LAB — URINE CULTURE: Culture: >=100000 — AB

## 2020-09-27 NOTE — TOC Progression Note (Addendum)
Transition of Care [facility]) - Progression Note    Patient Details  Name: Alexandra Martinez MRN: [id number] Date of Birth: 1930/01/15  Transition of Care [facility]) CM/SW Contact  Elliot Gurney [place], [place] Phone Number: [phone] 09/27/2020, 9:56 AM  Clinical Narrative:    Patient's family agreeable to residential hospice for patient. [facility] residential hospice contacted.Spoke to [place], referral faxed to [phone].  TOC will continue to follow.    [address] LCSW Transition of Care [phone]   Expected Discharge Plan: Memory Care    Expected Discharge Plan and Services Expected Discharge Plan: Memory Care   Discharge Planning Services: CM Consult Post Acute Care Choice: NA Living arrangements for the past 2 months: Skilled Nursing Facility                                       Social Determinants of Health (SDOH) Interventions    Readmission Risk Interventions No flowsheet data found.

## 2020-09-27 NOTE — Plan of Care (Signed)

## 2020-09-27 NOTE — Progress Notes (Signed)
[name], female   DOB: 02-05-1930, 87 y.o.   MRN: [id number] [facility] PROGRESS NOTE  Alexandra Martinez XAJ:[id number] DOB: Jun 19, 1929 DOA: 09/24/2020 PCP: Crecencio Mc, MD  HPI/Subjective: Patient answers a few questions.  Some confusion.  Able to move her arms and legs.  Admitted on 09/24/2020 with a fall and found to have a cerebellar bleed.  Objective: Vitals:   09/27/20 0749 09/27/20 1100  BP: (!) 157/90 (!) 161/94  Pulse: 92 96  Resp: 16   Temp: 98 F (36.7 C) 97.9 F (36.6 C)  SpO2: 93% 95%   No intake or output data in the 24 hours ending 09/27/20 1301 Filed Weights   09/24/20 1726 09/25/20 0047  Weight: 61 kg 59.3 kg    ROS: Review of Systems  Unable to perform ROS: Acuity of condition  Respiratory:  Negative for shortness of breath.   Cardiovascular:  Negative for chest pain.  Gastrointestinal:  Negative for abdominal pain.  Exam: Physical Exam HENT:     Head: Normocephalic.     Mouth/Throat:     Pharynx: No oropharyngeal exudate.  Eyes:     General: Lids are normal.     Conjunctiva/sclera: Conjunctivae normal.  Cardiovascular:     Rate and Rhythm: Normal rate and regular rhythm.     Heart sounds: Normal heart sounds, S1 normal and S2 normal.  Pulmonary:     Breath sounds: No decreased breath sounds, wheezing, rhonchi or rales.  Abdominal:     Palpations: Abdomen is soft.     Tenderness: There is no abdominal tenderness.  Musculoskeletal:     Right foot: No swelling.     Left foot: No swelling.  Skin:    General: Skin is warm.     Findings: No rash.  Neurological:     Mental Status: She is alert.     Comments: Able to move her arms and legs.     Data Reviewed: Basic Metabolic Panel: Recent Labs  Lab 09/24/20 1840 09/26/20 0554  NA 139 138  K 3.8 3.3*  CL 102 103  CO2 27 25  GLUCOSE 97 106*  BUN 18 12  CREATININE 1.19* 0.96  CALCIUM 8.9 8.8*  MG  --  1.8  PHOS  --  3.5   Liver Function Tests: Recent Labs   Lab 09/24/20 1840  AST 18  ALT 9  ALKPHOS 59  BILITOT 0.7  PROT 6.3*  ALBUMIN 3.7   CBC: Recent Labs  Lab 09/24/20 1840 09/26/20 0554  WBC 6.4 7.4  NEUTROABS 4.2  --   HGB 10.8* 11.4*  HCT 32.6* 32.9*  MCV 93.1 92.7  PLT 143* 149*     Recent Results (from the past 240 hour(s))  Urine Culture     Status: Abnormal   Collection Time: 09/24/20  6:56 PM   Specimen: Urine, Random  Result Value Ref Range Status   Specimen Description   Final    URINE, RANDOM Performed at [facility], [address] [place], [place] [address]    Special Requests   Final    NONE Performed at [facility], [address] [place], [place] [address]    Culture >=100,000 COLONIES/mL ESCHERICHIA COLI (A)  Final   Report Status 09/27/2020 FINAL  Final   Organism ID, Bacteria ESCHERICHIA COLI (A)  Final      Susceptibility   Escherichia coli - MIC*    AMPICILLIN <=2 SENSITIVE Sensitive     CEFAZOLIN <=4  SENSITIVE Sensitive     CEFEPIME <=0.12 SENSITIVE Sensitive     CEFTRIAXONE <=0.25 SENSITIVE Sensitive     CIPROFLOXACIN <=0.25 SENSITIVE Sensitive     GENTAMICIN <=1 SENSITIVE Sensitive     IMIPENEM <=0.25 SENSITIVE Sensitive     NITROFURANTOIN <=16 SENSITIVE Sensitive     TRIMETH/SULFA <=20 SENSITIVE Sensitive     AMPICILLIN/SULBACTAM <=2 SENSITIVE Sensitive     PIP/TAZO <=4 SENSITIVE Sensitive     * >=100,000 COLONIES/mL ESCHERICHIA COLI  SARS CORONAVIRUS 2 (TAT 6-24 HRS) Nasopharyngeal Nasopharyngeal Swab     Status: None   Collection Time: 09/24/20 10:42 PM   Specimen: Nasopharyngeal Swab  Result Value Ref Range Status   SARS Coronavirus 2 NEGATIVE NEGATIVE Final    Comment: (NOTE) SARS-CoV-2 target nucleic acids are NOT DETECTED.  The SARS-CoV-2 RNA is generally detectable in upper and lower respiratory specimens during the acute phase of infection. Negative results do not preclude SARS-CoV-2 infection, do not rule out co-infections with other pathogens,  and should not be used as the sole basis for treatment or other patient management decisions. Negative results must be combined with clinical observations, patient history, and epidemiological information. The expected result is Negative.  Fact Sheet for Patients: SugarRoll.be  Fact Sheet for Healthcare Providers: https://www.woods-mathews.com/  This test is not yet approved or cleared by the [place] FDA and  has been authorized for detection and/or diagnosis of SARS-CoV-2 by FDA under an Emergency Use Authorization (EUA). This EUA will remain  in effect (meaning this test can be used) for the duration of the COVID-19 declaration under Se ction 564(b)(1) of the Act, 21 U.S.C. section 360bbb-3(b)(1), unless the authorization is terminated or revoked sooner.  Performed at [facility], [place] [address] [place], [place] [address]      Studies: MR BRAIN WO CONTRAST  Result Date: 09/25/2020 CLINICAL DATA:  Atrial fibrillation and dementia.  Recent fall. EXAM: MRI HEAD WITHOUT CONTRAST TECHNIQUE: Multiplanar, multiecho pulse sequences of the brain and surrounding structures were obtained without intravenous contrast. COMPARISON:  MRI study in CT study done yesterday. FINDINGS: Brain: Abnormal edema and petechial blood products throughout the inferior cerebellum on the left. The differential diagnosis for this is subacute infarction without residual restricted diffusion versus is cerebellar contusion. No other acute intracranial finding. Atrophy and chronic small-vessel ischemic changes of the cerebral hemispheric white matter seen otherwise. No hydrocephalus. No extra-axial fluid collection. Generalized atrophy with temporal lobe predominance. Vascular: Major vessels at the base of the brain show flow. Skull and upper cervical spine: Negative Sinuses/Orbits: Clear/normal Other: None IMPRESSION: Regional edema and petechial blood products within  the inferior cerebellum on the left without restricted diffusion presently. Most likely diagnosis is subacute infarction with petechial blood products. There is a remote possibility this could be a cerebellar hemorrhagic contusion, but I think that is considerably less likely Atrophy and chronic small-vessel ischemic changes otherwise. Electronically Signed   By: Nelson Chimes M.D.   On: 09/25/2020 18:44    Scheduled Meds:  levothyroxine  50 mcg Oral Q0600   metoprolol tartrate  25 mg Oral BID   Continuous Infusions:  Assessment/Plan:   Left cerebellar bleed likely from underlying stroke.  Patient with confusion.  Palliative care spoke with family about hospice home.  Currently no beds available in the hospice home. Chronic atrial fibrillation.  Anticoagulation held with brain bleed.  On metoprolol. E. coli UTI present on admission.  Treated with Rocephin.  Once made comfort care antibiotics were discontinued.  Acute metabolic encephalopathy with underlying dementia. Hyperlipidemia unspecified.  Lipitor discontinued Acquired thrombophilia secondary to chronic atrial fibrillation.  Holding anticoagulation with left cerebellar bleed. Hypothyroidism unspecified on levothyroxine Iron deficiency anemia        Code Status:     Code Status Orders  (From admission, onward)           Start     Ordered   09/25/20 0045  Do not attempt resuscitation (DNR)  Continuous       Question Answer Comment  In the event of cardiac or respiratory ARREST Do not call a "code blue"   In the event of cardiac or respiratory ARREST Do not perform Intubation, CPR, defibrillation or ACLS   In the event of cardiac or respiratory ARREST Use medication by any route, position, wound care, and other measures to relive pain and suffering. May use oxygen, suction and manual treatment of airway obstruction as needed for comfort.      09/25/20 0044           Code Status History     Date Active Date Inactive  Code Status Order ID Comments User Context   09/24/2020 2340 09/25/2020 0044 DNR [id number]  Toy Baker, MD ED   01/09/2014 1514 01/15/2014 1450 Full Code [id number]  Floyce Stakes, MD Inpatient      Family Communication: Spoke with patient's niece on the phone Disposition Plan: Status is: Inpatient  Dispo:  Patient From: [facility]  Planned Disposition: Hospice facility  Medically stable for discharge: Yes   Consultants: Palliative care Neurology  Time spent: 27 minutes  [place]

## 2020-09-27 NOTE — Progress Notes (Signed)
   09/27/20 1000  SLP Visit Information  SLP Received On 09/27/20  Reason Eval/Treat Not Completed Other (comment). Per palliative care note, pt transitioning to hospice, with residential hospice placement pending. SLP to s/o.   Deneise Lever, MS, Actor

## 2020-09-28 DIAGNOSIS — D509 Iron deficiency anemia, unspecified: Secondary | ICD-10-CM

## 2020-09-28 DIAGNOSIS — Z515 Encounter for palliative care: Secondary | ICD-10-CM

## 2020-09-28 NOTE — TOC Progression Note (Signed)
Transition of Care [facility]) - Progression Note    Patient Details  Name: Alexandra Martinez MRN: [id number] Date of Birth: 23-Dec-1929  Transition of Care [facility]) CM/SW Contact  Elliot Gurney [place], [place] Phone Number: [phone] 09/28/2020, 12:49 PM  Clinical Narrative:     11:11 am Phone call to [facility] to check bed status. Per Marlowe Kays referral placed, she will have hospital liaison-Misty to call back regarding status.   [address], LCSW Transition of Care [phone]     Expected Discharge Plan: Memory Care    Expected Discharge Plan and Services Expected Discharge Plan: Memory Care   Discharge Planning Services: CM Consult Post Acute Care Choice: NA Living arrangements for the past 2 months: Skilled Nursing Facility                                       Social Determinants of Health (SDOH) Interventions    Readmission Risk Interventions No flowsheet data found.

## 2020-09-28 NOTE — Progress Notes (Signed)
[name], female   DOB: Jun 26, 1929, 87 y.o.   MRN: [id number] [facility] PROGRESS NOTE  ADELAI Martinez VHQ:[id number] DOB: 1929/05/02 DOA: 09/24/2020 PCP: Crecencio Mc, MD  HPI/Subjective: Patient this morning was lethargic but did answer some questions.  She answered no when I asked her if she was in any pain.  No shortness of breath.  Admitted with left cerebellar bleed and underlying stroke.  I spoke with the patient's niece Alexandra Martinez and she was unable to arouse the patient this afternoon while she was in the room.  I told her to talk to her.  Even if she does not respond it does not mean that she cannot hear you.  Objective: Vitals:   09/27/20 1619 09/27/20 2114  BP: (!) 142/84 132/60  Pulse: 90 74  Resp: 16 16  Temp: 97.8 F (36.6 C) 98.4 F (36.9 C)  SpO2: 92%     Intake/Output Summary (Last 24 hours) at 09/28/2020 1527 Last data filed at 09/28/2020 1353 Gross per 24 hour  Intake 0 ml  Output --  Net 0 ml   Filed Weights   09/24/20 1726 09/25/20 0047  Weight: 61 kg 59.3 kg    ROS: Review of Systems  Respiratory:  Negative for shortness of breath.   Cardiovascular:  Negative for chest pain.  Gastrointestinal:  Negative for abdominal pain.  Exam: Physical Exam HENT:     Head: Normocephalic.     Mouth/Throat:     Comments: Unable to look into mouth today. Eyes:     General: Lids are normal.     Conjunctiva/sclera: Conjunctivae normal.     Pupils: Pupils are equal, round, and reactive to light.  Cardiovascular:     Rate and Rhythm: Normal rate and regular rhythm.     Heart sounds: Normal heart sounds, S1 normal and S2 normal.  Pulmonary:     Breath sounds: Normal breath sounds. No decreased breath sounds, wheezing, rhonchi or rales.  Abdominal:     Palpations: Abdomen is soft.     Tenderness: There is no abdominal tenderness.  Musculoskeletal:     Right ankle: No swelling.     Left ankle: No swelling.  Skin:    General: Skin is warm.      Findings: No rash.  Neurological:     Mental Status: She is lethargic.     Data Reviewed: Basic Metabolic Panel: Recent Labs  Lab 09/24/20 1840 09/26/20 0554  NA 139 138  K 3.8 3.3*  CL 102 103  CO2 27 25  GLUCOSE 97 106*  BUN 18 12  CREATININE 1.19* 0.96  CALCIUM 8.9 8.8*  MG  --  1.8  PHOS  --  3.5   Liver Function Tests: Recent Labs  Lab 09/24/20 1840  AST 18  ALT 9  ALKPHOS 59  BILITOT 0.7  PROT 6.3*  ALBUMIN 3.7   CBC: Recent Labs  Lab 09/24/20 1840 09/26/20 0554  WBC 6.4 7.4  NEUTROABS 4.2  --   HGB 10.8* 11.4*  HCT 32.6* 32.9*  MCV 93.1 92.7  PLT 143* 149*     Recent Results (from the past 240 hour(s))  Urine Culture     Status: Abnormal   Collection Time: 09/24/20  6:56 PM   Specimen: Urine, Random  Result Value Ref Range Status   Specimen Description   Final    URINE, RANDOM Performed at [facility], [address] [place], [place] [address]    Special Requests  Final    NONE Performed at [facility], [address] [place], [place] [address]    Culture >=100,000 COLONIES/mL ESCHERICHIA COLI (A)  Final   Report Status 09/27/2020 FINAL  Final   Organism ID, Bacteria ESCHERICHIA COLI (A)  Final      Susceptibility   Escherichia coli - MIC*    AMPICILLIN <=2 SENSITIVE Sensitive     CEFAZOLIN <=4 SENSITIVE Sensitive     CEFEPIME <=0.12 SENSITIVE Sensitive     CEFTRIAXONE <=0.25 SENSITIVE Sensitive     CIPROFLOXACIN <=0.25 SENSITIVE Sensitive     GENTAMICIN <=1 SENSITIVE Sensitive     IMIPENEM <=0.25 SENSITIVE Sensitive     NITROFURANTOIN <=16 SENSITIVE Sensitive     TRIMETH/SULFA <=20 SENSITIVE Sensitive     AMPICILLIN/SULBACTAM <=2 SENSITIVE Sensitive     PIP/TAZO <=4 SENSITIVE Sensitive     * >=100,000 COLONIES/mL ESCHERICHIA COLI  SARS CORONAVIRUS 2 (TAT 6-24 HRS) Nasopharyngeal Nasopharyngeal Swab     Status: None   Collection Time: 09/24/20 10:42 PM   Specimen: Nasopharyngeal Swab  Result Value  Ref Range Status   SARS Coronavirus 2 NEGATIVE NEGATIVE Final    Comment: (NOTE) SARS-CoV-2 target nucleic acids are NOT DETECTED.  The SARS-CoV-2 RNA is generally detectable in upper and lower respiratory specimens during the acute phase of infection. Negative results do not preclude SARS-CoV-2 infection, do not rule out co-infections with other pathogens, and should not be used as the sole basis for treatment or other patient management decisions. Negative results must be combined with clinical observations, patient history, and epidemiological information. The expected result is Negative.  Fact Sheet for Patients: SugarRoll.be  Fact Sheet for Healthcare Providers: https://www.woods-mathews.com/  This test is not yet approved or cleared by the [place] FDA and  has been authorized for detection and/or diagnosis of SARS-CoV-2 by FDA under an Emergency Use Authorization (EUA). This EUA will remain  in effect (meaning this test can be used) for the duration of the COVID-19 declaration under Se ction 564(b)(1) of the Act, 21 U.S.C. section 360bbb-3(b)(1), unless the authorization is terminated or revoked sooner.  Performed at [facility], [place] [address], [place], [place] [address]        Scheduled Meds:  levothyroxine  50 mcg Oral Q0600   metoprolol tartrate  25 mg Oral BID   Assessment/Plan:  End-of-life care.  Patient is comfort care measures.  This morning stated she was not in any pain.  Overall prognosis is poor.   Left cerebellar bleed secondary to underlying stroke.  Patient with confusion and today lethargy.  Awaiting hospice home bed.  Patient comfort care.  Patient is a DO NOT RESUSCITATE.  Overall prognosis poor. Chronic atrial fibrillation.  Anticoagulation held with brain bleed.  On low-dose metoprolol if able to take. Acute metabolic encephalopathy with underlying dementia Hyperlipidemia unspecified.  Lipitor  discontinued Acquired thrombophilia secondary to chronic atrial fibrillation.  Holding anticoagulation with left cerebellar brain bleed. Hypothyroidism unspecified on levothyroxine Iron deficiency anemia     Code Status:     Code Status Orders  (From admission, onward)           Start     Ordered   09/28/20 0834  Do not attempt resuscitation (DNR)  Continuous       Question Answer Comment  In the event of cardiac or respiratory ARREST Do not call a "code blue"   In the event of cardiac or respiratory ARREST Do not perform Intubation, CPR, defibrillation  or ACLS   In the event of cardiac or respiratory ARREST Use medication by any route, position, wound care, and other measures to relive pain and suffering. May use oxygen, suction and manual treatment of airway obstruction as needed for comfort.   Comments nurse may pronounce      09/28/20 0833           Code Status History     Date Active Date Inactive Code Status Order ID Comments User Context   09/25/2020 0044 09/28/2020 0833 DNR [id number]  Toy Baker, MD Inpatient   09/24/2020 2340 09/25/2020 0044 DNR [id number]  Toy Baker, MD ED   01/09/2014 1514 01/15/2014 1450 Full Code [id number]  Floyce Stakes, MD Inpatient      Family Communication: Spoke with patient's niece on the phone Disposition Plan: Status is: Inpatient  Dispo:  Patient From: Home  Planned Disposition: Residential Hospice (no hospice home vents today)  Medically stable for discharge to the hospice home when bed available.  Time spent: 26 minutes  [place]

## 2020-09-29 NOTE — Progress Notes (Signed)
At beginning of the shift, assisted patient to bedside commode and patient voided a moderate amount of urine. Not able to measure at that time but to guestimate around 200-250cc. This morning assisted patient to bedside commode and patient did not have to void and she let the Nurse know that she did not have to void when asked. Assisted patient back to bed.  Administered morning med (Levothyroxine with applesauce. After taking medication, patient ate the rest of the cup of applesauce. Currently patient is calm and is resting  with not signs of grimacing or tension noted. Will continue to monitor to end of shift.

## 2020-09-29 NOTE — Plan of Care (Signed)

## 2020-09-29 NOTE — Care Management Important Message (Signed)
Important Message  Patient Details  Name: Alexandra Martinez MRN: [id number] Date of Birth: [birth year]   Medicare Important Message Given:  Other (see comment)  Patient is on comfort care and awaiting a bed at the [facility]. Out of respect for the patient and family no Important Message from Medicare given   [place] 09/29/2020, 8:22 AM

## 2020-09-29 NOTE — Progress Notes (Addendum)
[facility]) RN Hospital Liaison note:  Received referral for Hospice Home. Patient is ambulating with rolling walker. 50% of meal intake at breakfast. Not appropriate for Hospice Home at this time.   May be appropriate for hospice in the facility.   Dreama Saa, RN [facility] manager notified.   Please let me know if you have any questions or concerns.   Thank you,   Bobbie "Loren Racer, [place], BSN [facility] Liaison [phone]

## 2020-09-29 NOTE — Progress Notes (Signed)
[name], female   DOB: 07/31/1929, 87 y.o.   MRN: [id number] [facility] PROGRESS NOTE  Alexandra Martinez [id number]:[id number] DOB: 1929-12-06 DOA: 09/24/2020 PCP: Crecencio Mc, MD  HPI/Subjective: Patient feeling okay.  States she is not hungry.  Able to move both her arms.  For me unable to lift her legs up off the bed.  Objective: Vitals:   09/29/20 0059 09/29/20 0527  BP: 111/89 (!) 126/50  Pulse: 98 79  Resp: 20 18  Temp: 97.8 F (36.6 C) 99.4 F (37.4 C)  SpO2: 93% 96%    Intake/Output Summary (Last 24 hours) at 09/29/2020 1530 Last data filed at 09/29/2020 1350 Gross per 24 hour  Intake 120 ml  Output --  Net 120 ml   Filed Weights   09/24/20 1726 09/25/20 0047  Weight: 61 kg 59.3 kg    ROS: Review of Systems  Respiratory:  Negative for shortness of breath.   Cardiovascular:  Negative for chest pain.  Gastrointestinal:  Negative for abdominal pain.  Exam: Physical Exam HENT:     Head: Normocephalic.     Mouth/Throat:     Pharynx: No oropharyngeal exudate.  Eyes:     General: Lids are normal.     Conjunctiva/sclera: Conjunctivae normal.     Pupils: Pupils are equal, round, and reactive to light.  Cardiovascular:     Rate and Rhythm: Normal rate and regular rhythm.     Heart sounds: Normal heart sounds, S1 normal and S2 normal.  Pulmonary:     Breath sounds: Normal breath sounds. No decreased breath sounds, wheezing, rhonchi or rales.  Abdominal:     Palpations: Abdomen is soft.     Tenderness: There is no abdominal tenderness.  Musculoskeletal:     Right lower leg: Swelling present.     Left lower leg: Swelling present.  Skin:    General: Skin is warm.     Findings: No rash.  Neurological:     Mental Status: She is lethargic.     Comments: Answered a few questions for me this morning.     Data Reviewed: Basic Metabolic Panel: Recent Labs  Lab 09/24/20 1840 09/26/20 0554  NA 139 138  K 3.8 3.3*  CL 102 103  CO2 27 25   GLUCOSE 97 106*  BUN 18 12  CREATININE 1.19* 0.96  CALCIUM 8.9 8.8*  MG  --  1.8  PHOS  --  3.5   Liver Function Tests: Recent Labs  Lab 09/24/20 1840  AST 18  ALT 9  ALKPHOS 59  BILITOT 0.7  PROT 6.3*  ALBUMIN 3.7   CBC: Recent Labs  Lab 09/24/20 1840 09/26/20 0554  WBC 6.4 7.4  NEUTROABS 4.2  --   HGB 10.8* 11.4*  HCT 32.6* 32.9*  MCV 93.1 92.7  PLT 143* 149*     Recent Results (from the past 240 hour(s))  Urine Culture     Status: Abnormal   Collection Time: 09/24/20  6:56 PM   Specimen: Urine, Random  Result Value Ref Range Status   Specimen Description   Final    URINE, RANDOM Performed at [facility], [address], [place], [place] [address]    Special Requests   Final    NONE Performed at [facility], [address] [place], [place] [address]    Culture >=100,000 COLONIES/mL ESCHERICHIA COLI (A)  Final   Report Status 09/27/2020 FINAL  Final   Organism ID, Bacteria ESCHERICHIA COLI (  A)  Final      Susceptibility   Escherichia coli - MIC*    AMPICILLIN <=2 SENSITIVE Sensitive     CEFAZOLIN <=4 SENSITIVE Sensitive     CEFEPIME <=0.12 SENSITIVE Sensitive     CEFTRIAXONE <=0.25 SENSITIVE Sensitive     CIPROFLOXACIN <=0.25 SENSITIVE Sensitive     GENTAMICIN <=1 SENSITIVE Sensitive     IMIPENEM <=0.25 SENSITIVE Sensitive     NITROFURANTOIN <=16 SENSITIVE Sensitive     TRIMETH/SULFA <=20 SENSITIVE Sensitive     AMPICILLIN/SULBACTAM <=2 SENSITIVE Sensitive     PIP/TAZO <=4 SENSITIVE Sensitive     * >=100,000 COLONIES/mL ESCHERICHIA COLI  SARS CORONAVIRUS 2 (TAT 6-24 HRS) Nasopharyngeal Nasopharyngeal Swab     Status: None   Collection Time: 09/24/20 10:42 PM   Specimen: Nasopharyngeal Swab  Result Value Ref Range Status   SARS Coronavirus 2 NEGATIVE NEGATIVE Final    Comment: (NOTE) SARS-CoV-2 target nucleic acids are NOT DETECTED.  The SARS-CoV-2 RNA is generally detectable in upper and lower respiratory specimens  during the acute phase of infection. Negative results do not preclude SARS-CoV-2 infection, do not rule out co-infections with other pathogens, and should not be used as the sole basis for treatment or other patient management decisions. Negative results must be combined with clinical observations, patient history, and epidemiological information. The expected result is Negative.  Fact Sheet for Patients: SugarRoll.be  Fact Sheet for Healthcare Providers: https://www.woods-mathews.com/  This test is not yet approved or cleared by the [place] FDA and  has been authorized for detection and/or diagnosis of SARS-CoV-2 by FDA under an Emergency Use Authorization (EUA). This EUA will remain  in effect (meaning this test can be used) for the duration of the COVID-19 declaration under Se ction 564(b)(1) of the Act, 21 U.S.C. section 360bbb-3(b)(1), unless the authorization is terminated or revoked sooner.  Performed at [facility], [place] [address], [place], [place] [address]       Scheduled Meds:  levothyroxine  50 mcg Oral Q0600   metoprolol tartrate  25 mg Oral BID     Assessment/Plan:  Left cerebellar bleed secondary to underlying stroke.  Patient able to answer a few questions today.  Patient will need to be fed with each meal.  Patient made comfort care measures by palliative care.  Hospice facility did not feel that she was a candidate for the hospice home at this time.  Transitional care team looking into whether the memory care unit will be able to take her back or whether other options need to be figured out. Chronic atrial fibrillation.  Anticoagulation held with brain bleed.  On low-dose metoprolol if able to take. Acute metabolic encephalopathy with underlying dementia. Hyperlipidemia unspecified.  Lipitor discontinued. Acquired thrombophilia secondary to chronic atrial fibrillation.  Holding anticoagulation with left  cerebellar brain bleed Hypothyroidism unspecified on levothyroxine Iron deficiency anemia     Code Status:     Code Status Orders  (From admission, onward)           Start     Ordered   09/28/20 0834  Do not attempt resuscitation (DNR)  Continuous       Question Answer Comment  In the event of cardiac or respiratory ARREST Do not call a "code blue"   In the event of cardiac or respiratory ARREST Do not perform Intubation, CPR, defibrillation or ACLS   In the event of cardiac or respiratory ARREST Use medication by any route, position, wound care, and  other measures to relive pain and suffering. May use oxygen, suction and manual treatment of airway obstruction as needed for comfort.   Comments nurse may pronounce      09/28/20 0833           Code Status History     Date Active Date Inactive Code Status Order ID Comments User Context   09/25/2020 0044 09/28/2020 0833 DNR [id number]  Toy Baker, MD Inpatient   09/24/2020 2340 09/25/2020 0044 DNR [id number]  Toy Baker, MD ED   01/09/2014 1514 01/15/2014 1450 Full Code [id number]  Floyce Stakes, MD Inpatient      Family Communication: Spoke with niece on the phone Disposition Plan: Status is: Inpatient  Dispo:  Patient From: Home  Planned Disposition: Hospice home stated at this point she is not a candidate for the hospice facility.  Transitional care team looking to see if her memory care will take her back or whether we need to look into another facility.  Medically stable for discharge: Yes   Consultants: Palliative care  Time spent: 27 minutes, case discussed with transitional care team and nursing staff.  [place]  [facility] [employer]

## 2020-09-29 NOTE — TOC Progression Note (Addendum)
Transition of Care [facility]) - Progression Note    Patient Details  Name: Alexandra Martinez MRN: [id number] Date of Birth: [birth year]  Transition of Care [facility]) CM/SW [place], RN Phone Number: 09/29/2020, 2:00 PM  Clinical Narrative:   Hospice notified TOC that patient does not qualify for hospice house.  TOC contacted [facility] to assess whether patient can return to facility.  Awaiting call back.  1536  [facility] will assess patient tomorrow morning and advise RNCM if patient can return.   Niece aware of plan of care  Expected Discharge Plan: Memory Care    Expected Discharge Plan and Services Expected Discharge Plan: Memory Care   Discharge Planning Services: CM Consult Post Acute Care Choice: NA Living arrangements for the past 2 months: Skilled Nursing Facility                                       Social Determinants of Health (SDOH) Interventions    Readmission Risk Interventions No flowsheet data found.

## 2020-09-29 NOTE — Plan of Care (Signed)
  Problem: Education: Goal: Knowledge of secondary prevention will improve Outcome: Not Met (add Reason) Variance Psychosocial issues Note: Pt is confused Goal: Knowledge of patient specific risk factors addressed and post discharge goals established will improve Outcome: Not Met (add Reason) Variance Psychosocial issues

## 2020-09-30 ENCOUNTER — Ambulatory Visit: Payer: Medicare Other | Admitting: Internal Medicine

## 2020-09-30 MED ORDER — MORPHINE SULFATE (PF) 2 MG/ML IV SOLN: 1.0000 mg | INTRAVENOUS | Status: DC | PRN

## 2020-09-30 MED ORDER — ACETAMINOPHEN 325 MG PO TABS: 650.0000 mg | ORAL_TABLET | ORAL | Status: AC | PRN

## 2020-09-30 MED ORDER — METOPROLOL TARTRATE 25 MG PO TABS: 25.0000 mg | ORAL_TABLET | Freq: Two times a day (BID) | ORAL | 0 refills | Status: AC

## 2020-09-30 NOTE — Progress Notes (Addendum)
[facility]) RN Hospital Liaison note:  Received request from Dreama Saa, RN [facility] manager and Asencion Gowda, NP Palliative Medicine Team for hospice services at [facility] in [place] after discharge. Chart and patient information reviewed by [facility] physician. Hospice eligibility confirmed.   Spoke with [name], patient's niece, and Raiford Simmonds, patient's nephew, to initiate education related to hospice philosophy, services and team approach to care. Patient/family verbalized understanding of information given. Per discussion, the plan is for discharge to [facility] by EMS on 6.14.22.  DME needs discussed. Patient has the following equipment in the facility: a walker. Family requests the following equipment for delivery: hospital bed (no rails) and a BSC. Address has been verified and is correct in the chart. [name] will be the contact to arrange time of equipment delivery.   Please send signed and completed DNR home with patient/family. Please provide prescriptions at discharge as needed to ensure ongoing symptom management.   [facility] information and contact numbers given to [name], patient's niece. Above information shared with Dreama Saa, RN [facility] manager. Please call with any questions or concerns.   Thank you for the opportunity to participate in this patient's care.   Bobbie "Loren Racer, RN, BSN [facility] Liaison [phone]

## 2020-09-30 NOTE — Discharge Summary (Addendum)
[place] [facility] NAME: Alexandra Martinez    MR#:  [id number]  DATE OF BIRTH:  [birth year]  DATE OF ADMISSION:  09/24/2020 ADMITTING PHYSICIAN: Kayleen Memos, DO  DATE OF DISCHARGE: 09/30/2020  PRIMARY CARE PHYSICIAN: Crecencio Mc, MD    ADMISSION DIAGNOSIS:  CVA (cerebral vascular accident) [facility]) [I63.9] Acute cystitis without hematuria [N30.00] Fall, initial encounter [W19.XXXA] Cerebrovascular accident (CVA), unspecified mechanism ([place]) [I63.9]  DISCHARGE DIAGNOSIS:  Active Problems:   Controlled atrial fibrillation ([place])   Hypothyroid   Anemia   End of life care   Acquired thrombophilia ([place])   CVA (cerebral vascular accident) ([place])   E. coli UTI   Cerebellar bleed ([place])   Atrial fibrillation, chronic ([place])   Acute metabolic encephalopathy   Hyperlipidemia   SECONDARY DIAGNOSIS:   Past Medical History:  Diagnosis Date   Anemia    r/t uterine fibroids   Atrial fib/flutter, transient    [facility] admission for RVR 03/2009   DDD (degenerative disc disease)    Dysrhythmia    A-FIB   GERD (gastroesophageal reflux disease)    H/O bladder infections    H/O hiatal hernia    H/O thyroid nodule    surgically removed   Heart murmur    moderate MR/TR 08/2013 echo [name])   Hemorrhoids    Hiatal hernia with gastroesophageal reflux 2008   by EGD,  [name]   High cholesterol    History of uterine fibroid    Hydronephrosis    uretero- with congenital UPJ obstruction- Dr. Edrick Oh   Hydronephrosis with ureteropelvic junction obstruction    Hypertension    [facility]   Hypothyroidism    Nocturia    Osteoporosis    Polyuria    Recurrent UTI     HOSPITAL COURSE:   Left cerebellar bleed secondary to underlying stroke.  The patient does answer some questions.  Able to follow some commands.  Patient was seen by palliative care and made comfort measures in the hospital.  She will be followed by hospice over at her  facility.  If patient declines will be kept at her facility.  Patient will be taken back to her memory care unit.  Patient needs to be fed with each meal. Chronic atrial fibrillation.  Anticoagulation held with brain bleed.  Patient on low-dose metoprolol for heart rate control. Acute metabolic encephalopathy with underlying dementia. Hyperlipidemia unspecified.  With comfort care measures Lipitor was discontinued. Acquired thrombophilia secondary to chronic atrial fibrillation.  Holding anticoagulation with left cerebellar brain bleed. Hypothyroidism unspecified.  On levothyroxine Iron deficiency anemia.  Last hemoglobin 11.4 Thrombocytopenia.  Last platelet count 149 Patient must be fed with each meal Acute cystitis with hematuria with E. coli.  Initially given antibiotics but antibiotics were discontinued after made comfort measures. Patient will need to be fed with each meal. Patient is a DO NOT RESUSCITATE  DISCHARGE CONDITIONS:   Guarded  CONSULTS OBTAINED:    Palliative care Neurology  DRUG ALLERGIES:   Allergies  Allergen Reactions   Celebrex [Celecoxib] Other (See Comments)   Donepezil Other (See Comments)    Stomach pains   Loratadine-Pseudoephedrine Er     Other reaction(s): Unknown   Venlafaxine Other (See Comments)   Vioxx [Rofecoxib] Other (See Comments)   Codeine Itching and Rash   Dexilant [Dexlansoprazole] Rash   Eliquis [Apixaban] Rash    DISCHARGE MEDICATIONS:   Allergies as of 09/30/2020       Reactions  Celebrex [celecoxib] Other (See Comments)   Donepezil Other (See Comments)   Stomach pains   Loratadine-pseudoephedrine Er    Other reaction(s): Unknown   Venlafaxine Other (See Comments)   Vioxx [rofecoxib] Other (See Comments)   Codeine Itching, Rash   Dexilant [dexlansoprazole] Rash   Eliquis [apixaban] Rash        Medication List     STOP taking these medications    ALPRAZolam 0.25 MG tablet Commonly known as: XANAX   alum & mag  hydroxide-simeth 400-400-40 MG/5ML suspension Commonly known as: MAALOX PLUS   Cholecalciferol 250 MCG (10000 UT) Caps   losartan 100 MG tablet Commonly known as: COZAAR   memantine 10 MG tablet Commonly known as: NAMENDA   mirtazapine 15 MG tablet Commonly known as: REMERON   ondansetron 4 MG tablet Commonly known as: ZOFRAN   pantoprazole 40 MG tablet Commonly known as: PROTONIX   PARoxetine 10 MG tablet Commonly known as: PAXIL   rivastigmine 3 MG capsule Commonly known as: EXELON   SYRINGE 3CC/23GX1" 23G X 1" 3 ML Misc   vitamin B-12 1000 MCG tablet Commonly known as: CYANOCOBALAMIN   Xarelto 15 MG Tabs tablet Generic drug: Rivaroxaban       TAKE these medications    acetaminophen 325 MG tablet Commonly known as: TYLENOL Take 2 tablets (650 mg total) by mouth every 4 (four) hours as needed for mild pain (or temp > 37.5 C (99.5 F)). What changed:  medication strength how much to take when to take this reasons to take this   levothyroxine 50 MCG tablet Commonly known as: SYNTHROID TAKE ONE TABLET EVERY DAY   metoprolol tartrate 25 MG tablet Commonly known as: LOPRESSOR Take 1 tablet (25 mg total) by mouth 2 (two) times daily. What changed:  medication strength how much to take   ondansetron 4 MG disintegrating tablet Commonly known as: Zofran ODT Take 1 tablet (4 mg total) by mouth every 8 (eight) hours as needed for nausea or vomiting.         DISCHARGE INSTRUCTIONS:   Follow-up with her facility Follow-up PMD 2 weeks Follow-up hospice at facility  If you experience worsening of your admission symptoms, develop shortness of breath, life threatening emergency, suicidal or homicidal thoughts you must seek medical attention immediately by calling 911 or calling your MD immediately  if symptoms less severe.  You Must read complete instructions/literature along with all the possible adverse reactions/side effects for all the Medicines you take  and that have been prescribed to you. Take any new Medicines after you have completely understood and accept all the possible adverse reactions/side effects.   Please note  You were cared for by a hospitalist during your hospital stay. If you have any questions about your discharge medications or the care you received while you were in the hospital after you are discharged, you can call the unit and asked to speak with the hospitalist on call if the hospitalist that took care of you is not available. Once you are discharged, your primary care physician will handle any further medical issues. Please note that NO REFILLS for any discharge medications will be authorized once you are discharged, as it is imperative that you return to your primary care physician (or establish a relationship with a primary care physician if you do not have one) for your aftercare needs so that they can reassess your need for medications and monitor your lab values.    Today   CHIEF COMPLAINT:  Chief Complaint  Patient presents with   Fall    HISTORY OF PRESENT ILLNESS:  Alexandra Martinez  is a 87 y.o. female came in after a fall and found to have cerebellar bleed   VITAL SIGNS:  Blood pressure (!) 147/80, pulse 76, temperature 97.7 F (36.5 C), temperature source Oral, resp. rate 18, height 5' (1.524 m), weight 59.3 kg, SpO2 96 %.  I/O:   Intake/Output Summary (Last 24 hours) at 09/30/2020 1326 Last data filed at 09/30/2020 1029 Gross per 24 hour  Intake 120 ml  Output --  Net 120 ml    PHYSICAL EXAMINATION:  GENERAL:  87 y.o.-year-old patient lying in the bed with no acute distress.  EYES: Pupils equal, round, reactive to light and accommodation. No scleral icterus.  HEENT: Head atraumatic, normocephalic. Oropharynx and nasopharynx clear.  LUNGS: Normal breath sounds bilaterally, no wheezing, rales,rhonchi or crepitation. No use of accessory muscles of respiration.  CARDIOVASCULAR: S1, S2 irregularly  irregular.  No murmurs, rubs, or gallops.  ABDOMEN: Soft, non-tender, non-distended. EXTREMITIES: Trace pedal edema, no cyanosis, or clubbing.  NEUROLOGIC: Able to lift her arms up when I hold them up there.  Able to straight leg raise to command. PSYCHIATRIC: The patient is alert and answers a few questions.  SKIN: No obvious rash, lesion, or ulcer.   DATA REVIEW:   CBC Recent Labs  Lab 09/26/20 0554  WBC 7.4  HGB 11.4*  HCT 32.9*  PLT 149*    Chemistries  Recent Labs  Lab 09/24/20 1840 09/26/20 0554  NA 139 138  K 3.8 3.3*  CL 102 103  CO2 27 25  GLUCOSE 97 106*  BUN 18 12  CREATININE 1.19* 0.96  CALCIUM 8.9 8.8*  MG  --  1.8  AST 18  --   ALT 9  --   ALKPHOS 59  --   BILITOT 0.7  --      Microbiology Results  Results for orders placed or performed during the hospital encounter of 09/24/20  Urine Culture     Status: Abnormal   Collection Time: 09/24/20  6:56 PM   Specimen: Urine, Random  Result Value Ref Range Status   Specimen Description   Final    URINE, RANDOM Performed at [facility], [address] [place], [place] [address]    Special Requests   Final    NONE Performed at [facility], [address] [place], [place] [address]    Culture >=100,000 COLONIES/mL ESCHERICHIA COLI (A)  Final   Report Status 09/27/2020 FINAL  Final   Organism ID, Bacteria ESCHERICHIA COLI (A)  Final      Susceptibility   Escherichia coli - MIC*    AMPICILLIN <=2 SENSITIVE Sensitive     CEFAZOLIN <=4 SENSITIVE Sensitive     CEFEPIME <=0.12 SENSITIVE Sensitive     CEFTRIAXONE <=0.25 SENSITIVE Sensitive     CIPROFLOXACIN <=0.25 SENSITIVE Sensitive     GENTAMICIN <=1 SENSITIVE Sensitive     IMIPENEM <=0.25 SENSITIVE Sensitive     NITROFURANTOIN <=16 SENSITIVE Sensitive     TRIMETH/SULFA <=20 SENSITIVE Sensitive     AMPICILLIN/SULBACTAM <=2 SENSITIVE Sensitive     PIP/TAZO <=4 SENSITIVE Sensitive     * >=100,000 COLONIES/mL ESCHERICHIA COLI   SARS CORONAVIRUS 2 (TAT 6-24 HRS) Nasopharyngeal Nasopharyngeal Swab     Status: None   Collection Time: 09/24/20 10:42 PM   Specimen: Nasopharyngeal Swab  Result Value Ref Range Status   SARS Coronavirus 2 NEGATIVE NEGATIVE  Final    Comment: (NOTE) SARS-CoV-2 target nucleic acids are NOT DETECTED.  The SARS-CoV-2 RNA is generally detectable in upper and lower respiratory specimens during the acute phase of infection. Negative results do not preclude SARS-CoV-2 infection, do not rule out co-infections with other pathogens, and should not be used as the sole basis for treatment or other patient management decisions. Negative results must be combined with clinical observations, patient history, and epidemiological information. The expected result is Negative.  Fact Sheet for Patients: SugarRoll.be  Fact Sheet for Healthcare Providers: https://www.woods-mathews.com/  This test is not yet approved or cleared by the [place] FDA and  has been authorized for detection and/or diagnosis of SARS-CoV-2 by FDA under an Emergency Use Authorization (EUA). This EUA will remain  in effect (meaning this test can be used) for the duration of the COVID-19 declaration under Se ction 564(b)(1) of the Act, 21 U.S.C. section 360bbb-3(b)(1), unless the authorization is terminated or revoked sooner.  Performed at [facility], [place] [address] [place], [place] [address]     Management plans discussed with the patient, family and they are in agreement.  CODE STATUS:     Code Status Orders  (From admission, onward)           Start     Ordered   09/28/20 0834  Do not attempt resuscitation (DNR)  Continuous       Question Answer Comment  In the event of cardiac or respiratory ARREST Do not call a "code blue"   In the event of cardiac or respiratory ARREST Do not perform Intubation, CPR, defibrillation or ACLS   In the event of cardiac or  respiratory ARREST Use medication by any route, position, wound care, and other measures to relive pain and suffering. May use oxygen, suction and manual treatment of airway obstruction as needed for comfort.   Comments nurse may pronounce      09/28/20 0833           Code Status History     Date Active Date Inactive Code Status Order ID Comments User Context   09/25/2020 0044 09/28/2020 0833 DNR [id number]  Toy Baker, MD Inpatient   09/24/2020 2340 09/25/2020 0044 DNR [id number]  Toy Baker, MD ED   01/09/2014 1514 01/15/2014 1450 Full Code [id number]  Floyce Stakes, MD Inpatient       TOTAL TIME TAKING CARE OF THIS PATIENT: 35 minutes.    Loletha Grayer M.D on 09/30/2020 at 1:26 PM  Between 7am to 6pm - Pager - [phone]  After 6pm go to [email or web address] - password [email or web address]  [facility]  CC: Primary care physician; Crecencio Mc, MD

## 2020-09-30 NOTE — TOC Progression Note (Signed)
Transition of Care [facility]) - Progression Note    Patient Details  Name: Alexandra Martinez MRN: [id number] Date of Birth: 03/18/1930  Transition of Care [facility]) CM/SW [place], RN Phone Number: 09/30/2020, 1:52 PM  Clinical Narrative:   Per Merla Riches at [facility], patient and family aware.  Patient will be followed by [facility] at the facility.  [facility] will pick up patient at 5pm, care team aware.    Expected Discharge Plan: Memory Care    Expected Discharge Plan and Services Expected Discharge Plan: Memory Care   Discharge Planning Services: CM Consult Post Acute Care Choice: NA Living arrangements for the past 2 months: [place] Expected Discharge Date: 09/30/20                                     Social Determinants of Health (SDOH) Interventions    Readmission Risk Interventions No flowsheet data found.
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
## 2020-09-30 NOTE — Progress Notes (Signed)
Patient resting in bed this morning.  Patient responds to voice but quickly closes her eyes and is not interactive.  She refused her breakfast and all liquids offered.  She would not take her medication this morning and has not taken PO medication during the day for the past 2 days.  She is currently eating very little, MD and palliative aware.  She is offered feeding at each meal but patient refuses or takes only 1 or 2 bites.  Patient also with no urine output so far this shift.  Will continue to monitor

## 2020-10-01 ENCOUNTER — Telehealth: Payer: Self-pay

## 2020-10-01 ENCOUNTER — Ambulatory Visit: Payer: Medicare Other | Admitting: Internal Medicine

## 2020-10-01 NOTE — Telephone Encounter (Signed)
Received a refill request for Losartan. Looking in pt's chart the medication was d/c'd at discharge from hospital on 09/30/2020.

## 2020-10-01 NOTE — Telephone Encounter (Signed)
Kristi Barefoot from [facility] called and stated that pt was discharged from hospital today with an order for Hospice Care. She is wanting to know if you will be pt's attending physician.

## 2020-10-02 NOTE — Telephone Encounter (Signed)
noted 

## 2020-10-02 NOTE — Telephone Encounter (Signed)
Kristi is aware.

## 2020-10-09 ENCOUNTER — Telehealth: Payer: Self-pay | Admitting: Internal Medicine

## 2020-10-09 NOTE — Telephone Encounter (Signed)
Faxed to number provided below.

## 2020-10-09 NOTE — Telephone Encounter (Signed)
Stacy from [facility] is asking to fax order for patient to [phone]. She stated office fax is acting up. Thanks.

## 2020-11-14 ENCOUNTER — Other Ambulatory Visit: Payer: Self-pay | Admitting: Internal Medicine

## 2020-11-14 ENCOUNTER — Telehealth: Payer: Self-pay | Admitting: Internal Medicine

## 2020-11-14 MED ORDER — MORPHINE SULFATE (CONCENTRATE) 20 MG/ML PO SOLN: ORAL | 0 refills | Status: DC

## 2020-11-14 NOTE — Telephone Encounter (Signed)
We received the order it has been faxed back to Hospice and the rx has been faxed to pt's pharmacy.

## 2020-11-14 NOTE — Telephone Encounter (Signed)
Caller stated that she was calling on behalf of the PT to return a call about getting more morphine for the PT and would like to get more for the pain.

## 2020-11-14 NOTE — Telephone Encounter (Signed)
Called to speak with [name]. [name] called into the office to ask Dr.Tullo for morphine. [name] stated that she had received a call from her Hospice Nurse, Marzetta Board, saying that she Faxed a office to request the medicine. [name] requests we call and speak to [facility]. Her number is [phone].  Did you receive a order for a Morphine increase?

## 2020-11-21 ENCOUNTER — Telehealth: Payer: Self-pay | Admitting: Internal Medicine

## 2020-11-21 MED ORDER — MORPHINE SULFATE (CONCENTRATE) 20 MG/ML PO SOLN: ORAL | 0 refills | Status: DC

## 2020-11-21 NOTE — Telephone Encounter (Signed)
Morphine rx sent to [facility] .

## 2020-11-21 NOTE — Telephone Encounter (Signed)
RX Refill:morphine Last Seen:03-31-20 Last ordered:11-14-20

## 2020-11-21 NOTE — Telephone Encounter (Signed)
Patient is out of her morphine (ROXANOL) 20 MG/ML concentrated solution.

## 2020-12-03 ENCOUNTER — Other Ambulatory Visit: Payer: Self-pay | Admitting: Internal Medicine

## 2020-12-03 MED ORDER — MORPHINE SULFATE (CONCENTRATE) 20 MG/ML PO SOLN: ORAL | 0 refills | Status: DC

## 2020-12-03 NOTE — Telephone Encounter (Signed)
RX Refill:morphine Last Seen:03-31-20 Last ordered:11-21-20

## 2020-12-03 NOTE — Telephone Encounter (Signed)
[facility] called to request a refill on the morphine (ROXANOL) 20 MG/ML concentrated solution asap and asked that they call in more then 8 syringes so they don't have to call back as often.

## 2020-12-03 NOTE — Telephone Encounter (Signed)
Roxanol rx sent to pharmacy,  15 syringes requested per [facility] request

## 2020-12-05 ENCOUNTER — Telehealth: Payer: Self-pay | Admitting: Internal Medicine

## 2020-12-05 NOTE — Telephone Encounter (Signed)
I am going to defer this to the patient's PCP.  I will forward this to her as well.

## 2020-12-05 NOTE — Telephone Encounter (Signed)
Staci from [facility] called to advise that they would like to talk to [place] about getting the PT something for anxiety as whenever they have care they become more anxious. Staci can be reached at [phone].

## 2020-12-09 ENCOUNTER — Other Ambulatory Visit: Payer: Self-pay | Admitting: Internal Medicine

## 2020-12-09 DIAGNOSIS — F03911 Unspecified dementia, unspecified severity, with agitation: Secondary | ICD-10-CM

## 2020-12-09 DIAGNOSIS — F0391 Unspecified dementia with behavioral disturbance: Secondary | ICD-10-CM

## 2020-12-09 MED ORDER — MORPHINE SULFATE (CONCENTRATE) 20 MG/ML PO SOLN: ORAL | 0 refills | Status: DC

## 2020-12-09 MED ORDER — ALPRAZOLAM 0.5 MG PO TABS: 0.2500 mg | ORAL_TABLET | Freq: Two times a day (BID) | ORAL | 5 refills | Status: DC | PRN

## 2020-12-09 NOTE — Telephone Encounter (Signed)
Spoke with Hardie Pulley, RN to inform her that Alprazolam has been sent in to use for anxiety. Staci stated that pt had a very bad night on Saturday so  she reached out to the Hospice doctor and they prescribed her Seroquel 25 mg to take at bedtime but Staci stated that still doesn't seem to be helping very much so she said she would add the alprazolam PRN.

## 2020-12-09 NOTE — Addendum Note (Signed)
Addended by: Adair Laundry on: 12/09/2020 04:22 PM   Modules accepted: Orders

## 2020-12-09 NOTE — Telephone Encounter (Signed)
Alexandra Martinez is requesting a refill of morphine. She stated that [name] stated that only has four syringes left. They are giving one to pt scheduled daily and then she has an order for a prn dose. Alexandra Martinez stated that she is going to look into how often they are having to use it PRN. She did state that pt has been more agitated the last several days.

## 2020-12-09 NOTE — Telephone Encounter (Signed)
Stacey with [facility] called and states that pt takes it only once a day because that is how it is scheduled. She has four left and needs more

## 2021-01-14 ENCOUNTER — Telehealth: Payer: Self-pay

## 2021-01-14 NOTE — Telephone Encounter (Addendum)
Nickie, RN with [facility] called and stated that per the facility staff pt's pain is not controlled on current Morphine regimen. Regimen is 1 scheduled daily dose and PRN every 2 hours. They are wanting to know if they could get another scheduled dose. Pt is complaining her back is hurting and doesn't want to walk.   Call back # [phone]

## 2021-01-14 NOTE — Telephone Encounter (Signed)
Please find out if they want to use the liquid morphine or do they want a pill to last longer?

## 2021-01-15 NOTE — Telephone Encounter (Signed)
Letter faxed to [facility] per Katha Hamming, RN at [facility].

## 2021-01-15 NOTE — Telephone Encounter (Signed)
Letter printed and signed.  

## 2021-01-15 NOTE — Telephone Encounter (Signed)
They would like to stick with the liquid

## 2021-01-19 NOTE — Telephone Encounter (Signed)
Faxed to number provided below.

## 2021-01-19 NOTE — Telephone Encounter (Signed)
[facility] Calling back in and states they did not get this faxed.  Fax number is [phone]

## 2021-01-26 ENCOUNTER — Telehealth: Payer: Self-pay | Admitting: Internal Medicine

## 2021-01-26 NOTE — Telephone Encounter (Signed)
Nikia from [facility] called in stating that the patient hasnt been taking her medicine the past few days and she is been agitated lately.They would like to know if it is ok to give the patient toridol injections monthly instead.Please call her at [phone].

## 2021-01-27 MED ORDER — HALOPERIDOL LACTATE 5 MG/ML IJ SOLN: 2.0000 mg | Freq: Four times a day (QID) | INTRAMUSCULAR | 2 refills | Status: DC | PRN

## 2021-01-27 NOTE — Telephone Encounter (Signed)
Spoke with [place] and she stated that she was requesting a Haldol injection. I asked what was the dose and she stated that it is what ever dose you recommend. The rx will need to be faxed to [facility] at [phone]. She also wanted to let you know that the pt has a skin tare on her right elbow the size of a quarter. She stated that she cleaned it and dressed it with proximal foam dressing today.

## 2021-01-27 NOTE — Telephone Encounter (Signed)
Alexandra Martinez has been informed.

## 2021-01-27 NOTE — Telephone Encounter (Signed)
If [facility] cannot handle the patient because they cannot give IM injections,  then it is time to transfer the patient to hospice for care.   I will not prescribe ativan gel

## 2021-01-27 NOTE — Telephone Encounter (Signed)
[facility] calling back in and states that [facility] is not always there to give the Patient a injection. Director states they can not do these injections there.   States they would ne another route. Could possibly try topical Ativan gel. They would need this new rx to have scheduled instructions as well.   Please advise

## 2021-02-10 ENCOUNTER — Ambulatory Visit: Payer: Medicare Other | Admitting: Dermatology

## 2021-02-11 ENCOUNTER — Other Ambulatory Visit: Payer: Self-pay

## 2021-02-11 NOTE — Telephone Encounter (Signed)
Refilled: 12/09/2020

## 2021-02-12 MED ORDER — MORPHINE SULFATE (CONCENTRATE) 20 MG/ML PO SOLN: ORAL | 0 refills | Status: DC

## 2021-02-12 NOTE — Telephone Encounter (Signed)
Patient's caregiver called to check on status of refill.

## 2021-02-17 ENCOUNTER — Other Ambulatory Visit: Payer: Self-pay | Admitting: Internal Medicine

## 2021-02-17 ENCOUNTER — Ambulatory Visit: Payer: PRIVATE HEALTH INSURANCE | Admitting: Neurology

## 2021-02-18 ENCOUNTER — Telehealth: Payer: Self-pay | Admitting: Internal Medicine

## 2021-02-18 MED ORDER — ALPRAZOLAM 0.5 MG PO TABS: 0.2500 mg | ORAL_TABLET | Freq: Two times a day (BID) | ORAL | 5 refills | Status: AC | PRN

## 2021-02-18 NOTE — Telephone Encounter (Signed)
Rx has been printed and placed in quick sign folder.

## 2021-02-18 NOTE — Telephone Encounter (Signed)
[facility] called in regards to patient needing a refill for alprazolam 0.5mg  tablet. Prescription can be faxed to facility at [phone]  Callback number for Andreas Blower is [phone]

## 2021-02-18 NOTE — Telephone Encounter (Signed)
Faxed

## 2021-04-01 ENCOUNTER — Telehealth: Payer: Self-pay | Admitting: Internal Medicine

## 2021-04-01 NOTE — Telephone Encounter (Signed)
Kevan Rosebush from [facility] called in requesting for refill on medication (morphine (ROXANOL) 20 MG/ML concentrated solution). Oksana requesting order to be faxed over to [facility] at [phone]. Henrene Dodge stated if there is any question to please call [facility] at [phone].

## 2021-04-02 ENCOUNTER — Other Ambulatory Visit: Payer: Self-pay | Admitting: Internal Medicine

## 2021-04-02 MED ORDER — MORPHINE SULFATE (CONCENTRATE) 20 MG/ML PO SOLN: ORAL | 0 refills | Status: DC

## 2021-04-27 ENCOUNTER — Telehealth: Payer: Self-pay

## 2021-04-27 DIAGNOSIS — F03B18 Unspecified dementia, moderate, with other behavioral disturbance: Secondary | ICD-10-CM

## 2021-04-27 MED ORDER — QUETIAPINE FUMARATE 50 MG PO TABS: 50.0000 mg | ORAL_TABLET | Freq: Three times a day (TID) | ORAL | 1 refills | Status: AC

## 2021-04-27 NOTE — Telephone Encounter (Signed)
Increase the seroquel..  dose is not in chart

## 2021-04-27 NOTE — Telephone Encounter (Signed)
Pt is currently taking Seroquel 25 mg BID.

## 2021-04-27 NOTE — Addendum Note (Signed)
Addended by: Crecencio Mc on: 04/27/2021 03:13 PM   Modules accepted: Orders

## 2021-04-27 NOTE — Telephone Encounter (Signed)
Seroquel 50 mg every 8 hours.  Rx sent electroncally  to [facility]

## 2021-04-27 NOTE — Telephone Encounter (Signed)
Janett Billow, RN with [facility] called to state that pt has fallen 3 times in the last week, is very agitated and combative at times. Janett Billow was wanting to know if you would want to increase the seroquel, or stop the alprazolam and start either lorazepam or Temazepam 15 mg. She stated that the falls are mostly occurring in the morning due to being agitated and pacing the floors.

## 2021-04-27 NOTE — Telephone Encounter (Signed)
Spoke with Janett Billow, RN with [facility] to let her know that the Dr. Derrel Nip would like to increase the Seroquel to 50 mg every 8 hours and the rx has been sent to [facility]. Janett Billow, RN gave a verbal understanding.

## 2021-04-28 NOTE — Telephone Encounter (Signed)
Alexandra Martinez from [facility] called in regards to pt. She states pt is doing very well with Seroquel that were received yesterday.   She also states there is a cold going around the facility and that pt has been showing symptoms beginning today 1/10. Pt is showing signs of congestion, low grade fever of 99.8, and also a slight crackle in her left lower lung. Alexandra Martinez states the cold typically will last a day or two then go away. She just wanted to update PCP. Alexandra Martinez can be reached at [phone]

## 2021-04-28 NOTE — Telephone Encounter (Signed)
FYI

## 2021-04-29 ENCOUNTER — Telehealth: Payer: Self-pay | Admitting: Internal Medicine

## 2021-04-29 MED ORDER — MORPHINE SULFATE (CONCENTRATE) 20 MG/ML PO SOLN: ORAL | 0 refills | Status: DC

## 2021-04-29 MED ORDER — MORPHINE SULFATE (CONCENTRATE) 20 MG/ML PO SOLN: ORAL | 0 refills | Status: AC

## 2021-04-29 NOTE — Telephone Encounter (Signed)
noted 

## 2021-04-29 NOTE — Telephone Encounter (Signed)
Spoke with Janett Billow, RN with [facility] and informed her that the morphine has been sent in to [facility]. She asked that we fax it to [facility] so it has also been faxed to them.

## 2021-04-29 NOTE — Telephone Encounter (Signed)
Rocco Pauls from [facility] called, [phone]. Wanted Dr Derrel Nip to know that the hospital is full and can not do inpatient hospice at the hospital. She is comfortable right now at [facility]. Starting PRN oxygen, and wanted to know if morphine (ROXANOL) 20 MG/ML concentrated solution could be given every one hour as needed, would need a new prescription, currently reads every 2 hours as needed. Fax will be sent for this request.

## 2021-04-29 NOTE — Telephone Encounter (Signed)
Janett Billow, RN from [facility] stated that pt has tested positive for covid this morning. Pt had a temp of 102 was given 975 mg of tylenol temp came down to 101.6. Oxygen level this morning was 84% has now came back up to 94%. Pt is slightly cyanotic around the lips and has crackles is left lower lobe. Spoke with Dr. Derrel Nip verbally and she stated that the pt does not need to go to the ED she needs to be transitioned into the Hospice Home for end of life care. Janett Billow, RN stated that they do not except covid positive pt's but that she could try getting an order for Hospice Care at the hospital. Janett Billow, RN stated that she would let us know if she needed anything.

## 2021-04-29 NOTE — Addendum Note (Signed)
Addended by: Crecencio Mc on: 04/29/2021 12:43 PM   Modules accepted: Orders

## 2021-04-29 NOTE — Telephone Encounter (Signed)
New RX for for norphine every 1 hr prn sent to [facility] pharmacy and printed and signed

## 2021-04-29 NOTE — Addendum Note (Signed)
Addended by: Crecencio Mc on: 04/29/2021 04:33 PM   Modules accepted: Orders

## 2021-05-13 NOTE — Telephone Encounter (Signed)
Janett Billow from [facility] called in stating that Pt is no longer having Covid symptoms. Janett Billow stated that they discounted Pt PRN, and Oxygen.   Also, Janett Billow stated that Pt has a wound on the left arm that is now healed. Janett Billow stated that they discounted changing dressing. Telephone number for Janett Billow is [phone]

## 2021-05-13 NOTE — Telephone Encounter (Signed)
FYI

## 2021-05-18 ENCOUNTER — Telehealth: Payer: Self-pay | Admitting: Internal Medicine

## 2021-05-18 NOTE — Telephone Encounter (Signed)
Janett Billow called in from [facility] stating pt is crying all the time and combative during personal care and is showing signs of pain. Janett Billow would like to talk to provider or cma for recommendations

## 2021-05-18 NOTE — Telephone Encounter (Signed)
Wanting to know what can be done for Patient. Does she need an appointment?

## 2021-05-19 ENCOUNTER — Encounter: Payer: Self-pay | Admitting: Internal Medicine

## 2021-05-19 ENCOUNTER — Telehealth (INDEPENDENT_AMBULATORY_CARE_PROVIDER_SITE_OTHER): Payer: Medicare Other | Admitting: Internal Medicine

## 2021-05-19 DIAGNOSIS — G8929 Other chronic pain: Secondary | ICD-10-CM

## 2021-05-19 DIAGNOSIS — N3941 Urge incontinence: Secondary | ICD-10-CM

## 2021-05-19 DIAGNOSIS — F03911 Unspecified dementia, unspecified severity, with agitation: Secondary | ICD-10-CM

## 2021-05-19 DIAGNOSIS — Z8679 Personal history of other diseases of the circulatory system: Secondary | ICD-10-CM

## 2021-05-19 DIAGNOSIS — F03B18 Unspecified dementia, moderate, with other behavioral disturbance: Secondary | ICD-10-CM

## 2021-05-19 DIAGNOSIS — R634 Abnormal weight loss: Secondary | ICD-10-CM | POA: Diagnosis not present

## 2021-05-19 DIAGNOSIS — M545 Low back pain, unspecified: Secondary | ICD-10-CM

## 2021-05-19 NOTE — Telephone Encounter (Signed)
Pt and Hospice nurse, Janett Billow, are scheduled for a virtual visit today at 12pm. Janett Billow, RN wanted to go ahead and let you know that the [facility] doctor last night increased pt's Morhine dose to 5 mg every 8 hours scheduled and every 1 hour PRN. The Alprazolam dose was also increased to 1.5 mg every 8 hours scheduled and every 4 hours PRN.

## 2021-05-19 NOTE — Assessment & Plan Note (Signed)
Secondary to dementia.  Continue use of adult diapers and monitor for decubitus ulcers.

## 2021-05-19 NOTE — Progress Notes (Signed)
Virtual Visit via [place] Note  This visit type was conducted due to national recommendations for restrictions regarding the COVID-19 pandemic (e.g. social distancing).  This format is felt to be most appropriate for this patient at this time.  All issues noted in this document were discussed and addressed.  No physical exam was performed (except for noted visual exam findings with Video Visits).   I connected withNAME@ on 05/19/21 at 12:00 PM EST by a video enabled telemedicine application or telephone and verified that I am speaking with the correct person using two identifiers. Location patient: home Location provider: work or home office Persons participating in the virtual visit: patient, provider and hospice RN Rocco Pauls  I discussed the limitations, risks, security and privacy concerns of performing an evaluation and management service by telephone and the availability of in person appointments. I also discussed with the patient that there may be a patient responsible charge related to this service. The patient expressed understanding and agreed to proceed.   Reason for visit: post COVID decline  HPI:  Ms Scaduto is an 88 yr old female with end stage dementia requiring assistance with ADLS,  currently residing in an assisted living facility, presents for follow up .  Patient is currently sitting up in chair,  dosing.  Her verbal communication has diminished to single word answers for the most part.  She ambulates occasionally with a rolling walker but needs assistance rising from chair and/or bed.   She had a COVID infection 2 weeks ago and since then has had a diminished appetite,  eating <25% of meals., and then only if the staff feeds her.   She sleeps most of the time,  in a chair,  and wakes up crying out in pain ,  but cannot identify or localize the pain.  She has no decubitus ulcers.  Some healing skin tears on forearms  from minor falls.  She has been agitated with  dressing changes.  She is incontinent     ROS: See pertinent positives and negatives per HPI.  Past Medical History:  Diagnosis Date   Anemia    r/t uterine fibroids   Atrial fib/flutter, transient    [facility] admission for RVR 03/2009   DDD (degenerative disc disease)    Dysrhythmia    A-FIB   GERD (gastroesophageal reflux disease)    H/O bladder infections    H/O hiatal hernia    H/O thyroid nodule    surgically removed   Heart murmur    moderate MR/TR 08/2013 echo [name])   Hemorrhoids    Hiatal hernia with gastroesophageal reflux 2008   by EGD,  [name]   High cholesterol    History of uterine fibroid    Hydronephrosis    uretero- with congenital UPJ obstruction- Dr. Edrick Oh   Hydronephrosis with ureteropelvic junction obstruction    Hypertension    [facility]   Hypothyroidism    Nocturia    Osteoporosis    Polyuria    Recurrent UTI     Past Surgical History:  Procedure Laterality Date   ADENOIDECTOMY N/A 01/05/2016   Procedure: ADENOIDECTOMY;  Surgeon: Margaretha Sheffield, MD;  Location: [facility] ORS;  Service: ENT;  Laterality: N/A;   BACK SURGERY     CATARACT EXTRACTION     left   CERVICAL [place] SURGERY  1980's   CHOLECYSTECTOMY     NASOPHARYNGOSCOPY  01/05/2016   Procedure: NASOPHARYNGOSCOPY;  Surgeon: Margaretha Sheffield, MD;  Location: [facility] ORS;  Service: ENT;;  THYROID LOBECTOMY     partial, right lobe   TOTAL ABDOMINAL HYSTERECTOMY W/ BILATERAL SALPINGOOPHORECTOMY      Family History  Problem Relation Age of Onset   Heart disease Mother    Coronary artery disease Father        MI's    SOCIAL HX:  reports that she has never smoked. She has never used smokeless tobacco. She reports that she does not drink alcohol and does not use drugs.    Current Outpatient Medications:    acetaminophen (TYLENOL) 325 MG tablet, Take 2 tablets (650 mg total) by mouth every 4 (four) hours as needed for mild pain (or temp > 37.5 C (99.5 F)). (Patient taking  differently: Take 650 mg by mouth 2 (two) times daily. And 2 tablets every 4 hours as needed), Disp: , Rfl:    ALPRAZolam (XANAX) 0.5 MG tablet, Take 0.5 tablets (0.25 mg total) by mouth 2 (two) times daily as needed for anxiety. (Patient taking differently: Take 0.5 mg by mouth every 8 (eight) hours. And every 1 hour PRN), Disp: 60 tablet, Rfl: 5   levothyroxine (SYNTHROID) 50 MCG tablet, TAKE ONE TABLET EVERY DAY, Disp: 90 tablet, Rfl: 1   metoprolol tartrate (LOPRESSOR) 25 MG tablet, Take 1 tablet (25 mg total) by mouth 2 (two) times daily., Disp: 60 tablet, Rfl: 0   morphine (ROXANOL) 20 MG/ML concentrated solution, 1) Give O.125 ml SL/PO  every 8 hours while awake .  2) Give  0.125 mL SL/PO every 1 hour prn pain/dyspnea/agitation (Patient taking differently: 1) 5mg  every 8 hours scheduled 2) 5 mg every hour as needed), Disp: 15 mL, Rfl: 0   ondansetron (ZOFRAN ODT) 4 MG disintegrating tablet, Take 1 tablet (4 mg total) by mouth every 8 (eight) hours as needed for nausea or vomiting., Disp: 20 tablet, Rfl: 0   QUEtiapine (SEROQUEL) 50 MG tablet, Take 1 tablet (50 mg total) by mouth 3 (three) times daily., Disp: 90 tablet, Rfl: 1   senna (SENOKOT) 8.6 MG TABS tablet, Take 1 tablet by mouth daily., Disp: , Rfl:   EXAM:  VITALS per patient if applicable:  GENERAL: lethargic, nonverbal , in no acute distress  HEENT: atraumatic, conjunt]ctiva clear, no obvious abnormalities on inspection of external nose and ears  NECK: normal movements of the head and neck  LUNGS: on inspection no signs of respiratory distress, breathing rate appears normal, no obvious gross SOB, gasping or wheezing  CV: no obvious cyanosis  MS: moves all visible extremities without noticeable abnormality  PSYCH/NEURO: lethargic, nonverbal sleeping upright pt  ASSESSMENT AND PLAN:  Discussed the following assessment and plan:  Chronic bilateral low back pain without sciatica  Urge urinary incontinence  Excessive  body weight loss  Moderate dementia with behavioral disturbance (HCC)  History of cerebellar hemorrhage  Agitation due to dementia  Chronic lower back pain Likely the source of her current pain.  Agree with increase in dose of MS04 started last evening of 5 mg eery 8 hours scheduled  Urge urinary incontinence Secondary to dementia.  Continue use of adult diapers and monitor for decubitus ulcers.   Excessive body weight loss Secondary to progression of dementia., aggravated by recent COVID infection.  She is receiving Hospice Care and currently only eating if fed,  And 25% of meals or less.   Moderate dementia with behavioral disturbance (HCC) Continue Seroquel 50 mg tid ,alprazolam 0.5 mg q hrs .  Will consider increasing seroquel vs adding Paxil for anxiety if additional  calming is needed   History of cerebellar hemorrhage Secondary to hemorrhagic stroke  In June 2022  while on chronic anticoagulation for atrial fibrillation .  Anticoagulation stopped;  No residual deficits.   Agitation due to dementia [facility]) Worse secondary to COVID infection.  Agree with dose changes of morphine and alprazolam; will increase seroquel next     I discussed the assessment and treatment plan with the patient. The patient was provided an opportunity to ask questions and all were answered. The patient agreed with the plan and demonstrated an understanding of the instructions.   The patient was advised to call back or seek an in-person evaluation if the symptoms worsen or if the condition fails to improve as anticipated.   I spent 30 minutes dedicated to the care of this patient on the date of this encounter to include pre-visit review of his medical history,  Face-to-face time with the patient , and post visit ordering of testing and therapeutics.    Crecencio Mc, MD

## 2021-05-19 NOTE — Assessment & Plan Note (Signed)
Likely the source of her current pain.  Agree with increase in dose of MS04 started last evening of 5 mg eery 8 hours scheduled

## 2021-05-19 NOTE — Assessment & Plan Note (Signed)
Continue Seroquel 50 mg tid ,alprazolam 0.5 mg q hrs .  Will consider increasing seroquel vs adding Paxil for anxiety if additional calming is needed

## 2021-05-19 NOTE — Assessment & Plan Note (Signed)
Worse secondary to COVID infection.  Agree with dose changes of morphine and alprazolam; will increase seroquel next

## 2021-05-19 NOTE — Assessment & Plan Note (Addendum)
Secondary to hemorrhagic stroke  In June 2022  while on chronic anticoagulation for atrial fibrillation .  Anticoagulation stopped;  No residual deficits.

## 2021-05-19 NOTE — Assessment & Plan Note (Signed)
Secondary to progression of dementia., aggravated by recent COVID infection.  She is receiving Hospice Care and currently only eating if fed,  And 25% of meals or less.

## 2021-06-02 ENCOUNTER — Telehealth: Payer: Self-pay | Admitting: Internal Medicine

## 2021-06-02 NOTE — Telephone Encounter (Signed)
Janett Billow from [facility] called in stating that pt is actively dying, is very comfortable and no new orders are needed.

## 2021-06-04 ENCOUNTER — Telehealth: Payer: Self-pay | Admitting: Internal Medicine

## 2021-06-04 NOTE — Telephone Encounter (Signed)
Called and spoke with [facility] they gave the case number. The information has been given to Dr. Derrel Nip.

## 2021-06-04 NOTE — Telephone Encounter (Signed)
Dee from [facility] called about death certificate being signed

## 2021-06-05 NOTE — Telephone Encounter (Signed)
Spoke with Alexandra Martinez home to let them know that the death certificate has been signed.

## 2021-06-17 DEATH — deceased

## 2022-03-31 NOTE — Telephone Encounter (Signed)
Error
